# Patient Record
Sex: Male | Born: 1945 | Race: White | Hispanic: No | Marital: Married | Smoking: Never smoker
Health system: Southern US, Community
[De-identification: ages and names within clinical notes are randomized; demographics above are authoritative.]

## PROBLEM LIST (undated history)

## (undated) DIAGNOSIS — T4145XA Adverse effect of unspecified anesthetic, initial encounter: Secondary | ICD-10-CM

## (undated) DIAGNOSIS — I872 Venous insufficiency (chronic) (peripheral): Secondary | ICD-10-CM

## (undated) DIAGNOSIS — M2550 Pain in unspecified joint: Secondary | ICD-10-CM

## (undated) DIAGNOSIS — I4510 Unspecified right bundle-branch block: Secondary | ICD-10-CM

## (undated) DIAGNOSIS — I8290 Acute embolism and thrombosis of unspecified vein: Secondary | ICD-10-CM

## (undated) DIAGNOSIS — M62838 Other muscle spasm: Secondary | ICD-10-CM

## (undated) DIAGNOSIS — R569 Unspecified convulsions: Secondary | ICD-10-CM

## (undated) DIAGNOSIS — I890 Lymphedema, not elsewhere classified: Secondary | ICD-10-CM

## (undated) DIAGNOSIS — K219 Gastro-esophageal reflux disease without esophagitis: Secondary | ICD-10-CM

## (undated) DIAGNOSIS — F419 Anxiety disorder, unspecified: Secondary | ICD-10-CM

## (undated) DIAGNOSIS — G3184 Mild cognitive impairment, so stated: Secondary | ICD-10-CM

## (undated) DIAGNOSIS — G4730 Sleep apnea, unspecified: Secondary | ICD-10-CM

## (undated) DIAGNOSIS — K759 Inflammatory liver disease, unspecified: Secondary | ICD-10-CM

## (undated) DIAGNOSIS — G4700 Insomnia, unspecified: Secondary | ICD-10-CM

## (undated) DIAGNOSIS — I639 Cerebral infarction, unspecified: Secondary | ICD-10-CM

## (undated) DIAGNOSIS — E785 Hyperlipidemia, unspecified: Secondary | ICD-10-CM

## (undated) DIAGNOSIS — H269 Unspecified cataract: Secondary | ICD-10-CM

## (undated) DIAGNOSIS — Z8719 Personal history of other diseases of the digestive system: Secondary | ICD-10-CM

## (undated) DIAGNOSIS — F32A Depression, unspecified: Secondary | ICD-10-CM

## (undated) DIAGNOSIS — T8859XA Other complications of anesthesia, initial encounter: Secondary | ICD-10-CM

## (undated) DIAGNOSIS — I2510 Atherosclerotic heart disease of native coronary artery without angina pectoris: Secondary | ICD-10-CM

## (undated) DIAGNOSIS — G8929 Other chronic pain: Secondary | ICD-10-CM

## (undated) DIAGNOSIS — M1990 Unspecified osteoarthritis, unspecified site: Secondary | ICD-10-CM

## (undated) DIAGNOSIS — J45909 Unspecified asthma, uncomplicated: Secondary | ICD-10-CM

## (undated) DIAGNOSIS — Z8709 Personal history of other diseases of the respiratory system: Secondary | ICD-10-CM

## (undated) DIAGNOSIS — M549 Dorsalgia, unspecified: Secondary | ICD-10-CM

## (undated) DIAGNOSIS — F329 Major depressive disorder, single episode, unspecified: Secondary | ICD-10-CM

## (undated) DIAGNOSIS — G250 Essential tremor: Secondary | ICD-10-CM

## (undated) DIAGNOSIS — I10 Essential (primary) hypertension: Secondary | ICD-10-CM

## (undated) DIAGNOSIS — R200 Anesthesia of skin: Secondary | ICD-10-CM

## (undated) DIAGNOSIS — B1910 Unspecified viral hepatitis B without hepatic coma: Secondary | ICD-10-CM

## (undated) HISTORY — PX: UMBILICAL HERNIA REPAIR: SHX196

## (undated) HISTORY — PX: KNEE ARTHROSCOPY: SUR90

## (undated) HISTORY — PX: HERNIA REPAIR: SHX51

## (undated) HISTORY — PX: BACK SURGERY: SHX140

## (undated) HISTORY — PX: COLONOSCOPY: SHX174

## (undated) HISTORY — PX: TONSILLECTOMY: SUR1361

## (undated) HISTORY — DX: Unspecified cataract: H26.9

## (undated) HISTORY — DX: Inflammatory liver disease, unspecified: K75.9

## (undated) HISTORY — DX: Anxiety disorder, unspecified: F41.9

## (undated) HISTORY — PX: BLEPHAROPLASTY: SUR158

## (undated) HISTORY — PX: ESOPHAGOGASTRODUODENOSCOPY: SHX1529

## (undated) HISTORY — DX: Hyperlipidemia, unspecified: E78.5

## (undated) HISTORY — DX: Unspecified convulsions: R56.9

## (undated) HISTORY — PX: CORONARY ANGIOPLASTY WITH STENT PLACEMENT: SHX49

## (undated) HISTORY — PX: EYE SURGERY: SHX253

## (undated) HISTORY — PX: MEDIAL PARTIAL KNEE REPLACEMENT: SHX5965

## (undated) HISTORY — DX: Unspecified osteoarthritis, unspecified site: M19.90

## (undated) HISTORY — PX: CARDIAC CATHETERIZATION: SHX172

## (undated) HISTORY — PX: JOINT REPLACEMENT: SHX530

## (undated) HISTORY — DX: Depression, unspecified: F32.A

## (undated) HISTORY — PX: OTHER SURGICAL HISTORY: SHX169

## (undated) HISTORY — PX: CARPAL TUNNEL RELEASE: SHX101

## (undated) HISTORY — DX: Major depressive disorder, single episode, unspecified: F32.9

## (undated) HISTORY — DX: Atherosclerotic heart disease of native coronary artery without angina pectoris: I25.10

---

## 1997-03-02 HISTORY — PX: OTHER SURGICAL HISTORY: SHX169

## 2003-01-30 HISTORY — PX: CORONARY ANGIOPLASTY WITH STENT PLACEMENT: SHX49

## 2010-01-01 DIAGNOSIS — H499 Unspecified paralytic strabismus: Secondary | ICD-10-CM | POA: Insufficient documentation

## 2012-05-26 DIAGNOSIS — Z9889 Other specified postprocedural states: Secondary | ICD-10-CM | POA: Insufficient documentation

## 2012-05-29 DIAGNOSIS — I4510 Unspecified right bundle-branch block: Secondary | ICD-10-CM | POA: Insufficient documentation

## 2012-05-31 ENCOUNTER — Encounter: Payer: Self-pay | Admitting: Internal Medicine

## 2012-06-21 DIAGNOSIS — R4182 Altered mental status, unspecified: Secondary | ICD-10-CM | POA: Insufficient documentation

## 2012-06-30 ENCOUNTER — Ambulatory Visit: Payer: Self-pay | Admitting: Orthopedic Surgery

## 2013-03-22 ENCOUNTER — Ambulatory Visit: Payer: Self-pay | Admitting: General Practice

## 2013-03-22 LAB — BASIC METABOLIC PANEL
Anion Gap: 4 — ABNORMAL LOW (ref 7–16)
BUN: 12 mg/dL (ref 7–18)
Calcium, Total: 9.2 mg/dL (ref 8.5–10.1)
Chloride: 96 mmol/L — ABNORMAL LOW (ref 98–107)
Co2: 28 mmol/L (ref 21–32)
Creatinine: 0.77 mg/dL (ref 0.60–1.30)
EGFR (African American): >60
EGFR (Non-African Amer.): >60
Glucose: 96 mg/dL (ref 65–99)
Osmolality: 257 (ref 275–301)
Potassium: 4 mmol/L (ref 3.5–5.1)
Sodium: 128 mmol/L — ABNORMAL LOW (ref 136–145)

## 2013-03-22 LAB — CBC
HCT: 42.3 % (ref 40.0–52.0)
HGB: 15.1 g/dL (ref 13.0–18.0)
MCH: 32.1 pg (ref 26.0–34.0)
MCHC: 35.7 g/dL (ref 32.0–36.0)
MCV: 90 fL (ref 80–100)
Platelet: 110 10*3/uL — ABNORMAL LOW (ref 150–440)
RBC: 4.7 10*6/uL (ref 4.40–5.90)
RDW: 13.2 % (ref 11.5–14.5)
WBC: 4.4 10*3/uL (ref 3.8–10.6)

## 2013-03-22 LAB — URINALYSIS, COMPLETE
Bacteria: NONE SEEN
Bilirubin,UR: NEGATIVE
Blood: NEGATIVE
Glucose,UR: NEGATIVE mg/dL (ref 0–75)
Ketone: NEGATIVE
Leukocyte Esterase: NEGATIVE
Nitrite: NEGATIVE
Ph: 6 (ref 4.5–8.0)
Protein: NEGATIVE
RBC,UR: 1 /HPF (ref 0–5)
Specific Gravity: 1.011 (ref 1.003–1.030)
Squamous Epithelial: <1
WBC UR: NONE SEEN /HPF (ref 0–5)

## 2013-03-22 LAB — APTT: Activated PTT: 30.9 secs (ref 23.6–35.9)

## 2013-03-22 LAB — PROTIME-INR
INR: 0.9
Prothrombin Time: 12.3 secs (ref 11.5–14.7)

## 2013-03-22 LAB — SEDIMENTATION RATE: Erythrocyte Sed Rate: 5 mm/hr (ref 0–20)

## 2013-03-22 LAB — MRSA PCR SCREENING

## 2013-03-23 LAB — URINE CULTURE

## 2013-04-05 ENCOUNTER — Inpatient Hospital Stay: Payer: Self-pay | Admitting: General Practice

## 2013-04-06 LAB — BASIC METABOLIC PANEL
Anion Gap: 4 — ABNORMAL LOW (ref 7–16)
BUN: 10 mg/dL (ref 7–18)
Calcium, Total: 7.9 mg/dL — ABNORMAL LOW (ref 8.5–10.1)
Chloride: 94 mmol/L — ABNORMAL LOW (ref 98–107)
Co2: 31 mmol/L (ref 21–32)
Creatinine: 0.88 mg/dL (ref 0.60–1.30)
EGFR (African American): >60
EGFR (Non-African Amer.): >60
Glucose: 122 mg/dL — ABNORMAL HIGH (ref 65–99)
Osmolality: 259 (ref 275–301)
Potassium: 4 mmol/L (ref 3.5–5.1)
Sodium: 129 mmol/L — ABNORMAL LOW (ref 136–145)

## 2013-04-06 LAB — HEMOGLOBIN: HGB: 12.7 g/dL — ABNORMAL LOW (ref 13.0–18.0)

## 2013-04-06 LAB — PLATELET COUNT: Platelet: 111 10*3/uL — ABNORMAL LOW (ref 150–440)

## 2013-04-07 LAB — BASIC METABOLIC PANEL
Anion Gap: 4 — ABNORMAL LOW (ref 7–16)
BUN: 11 mg/dL (ref 7–18)
Calcium, Total: 8.2 mg/dL — ABNORMAL LOW (ref 8.5–10.1)
Chloride: 92 mmol/L — ABNORMAL LOW (ref 98–107)
Co2: 30 mmol/L (ref 21–32)
Creatinine: 0.92 mg/dL (ref 0.60–1.30)
EGFR (African American): >60
EGFR (Non-African Amer.): >60
Glucose: 105 mg/dL — ABNORMAL HIGH (ref 65–99)
Osmolality: 253 (ref 275–301)
Potassium: 3.6 mmol/L (ref 3.5–5.1)
Sodium: 126 mmol/L — ABNORMAL LOW (ref 136–145)

## 2013-04-07 LAB — HEMOGLOBIN: HGB: 11.6 g/dL — ABNORMAL LOW (ref 13.0–18.0)

## 2013-04-07 LAB — PLATELET COUNT: Platelet: 110 10*3/uL — ABNORMAL LOW (ref 150–440)

## 2013-04-10 ENCOUNTER — Emergency Department: Payer: Self-pay | Admitting: Emergency Medicine

## 2013-04-10 LAB — COMPREHENSIVE METABOLIC PANEL
Albumin: 3 g/dL — ABNORMAL LOW (ref 3.4–5.0)
Alkaline Phosphatase: 43 U/L — ABNORMAL LOW
Anion Gap: 3 — ABNORMAL LOW (ref 7–16)
BUN: 12 mg/dL (ref 7–18)
Bilirubin,Total: 0.5 mg/dL (ref 0.2–1.0)
Calcium, Total: 9.2 mg/dL (ref 8.5–10.1)
Chloride: 95 mmol/L — ABNORMAL LOW (ref 98–107)
Co2: 33 mmol/L — ABNORMAL HIGH (ref 21–32)
Creatinine: 1 mg/dL (ref 0.60–1.30)
EGFR (African American): >60
EGFR (Non-African Amer.): >60
Glucose: 104 mg/dL — ABNORMAL HIGH (ref 65–99)
Osmolality: 263 (ref 275–301)
Potassium: 4 mmol/L (ref 3.5–5.1)
SGOT(AST): 28 U/L (ref 15–37)
SGPT (ALT): 24 U/L (ref 12–78)
Sodium: 131 mmol/L — ABNORMAL LOW (ref 136–145)
Total Protein: 6.6 g/dL (ref 6.4–8.2)

## 2013-04-10 LAB — CBC
HCT: 30.9 % — ABNORMAL LOW (ref 40.0–52.0)
HGB: 11 g/dL — ABNORMAL LOW (ref 13.0–18.0)
MCH: 32.7 pg (ref 26.0–34.0)
MCHC: 35.6 g/dL (ref 32.0–36.0)
MCV: 92 fL (ref 80–100)
Platelet: 146 10*3/uL — ABNORMAL LOW (ref 150–440)
RBC: 3.36 10*6/uL — ABNORMAL LOW (ref 4.40–5.90)
RDW: 13.1 % (ref 11.5–14.5)
WBC: 5.1 10*3/uL (ref 3.8–10.6)

## 2013-04-14 ENCOUNTER — Ambulatory Visit: Payer: Self-pay | Admitting: General Practice

## 2013-06-01 DIAGNOSIS — I639 Cerebral infarction, unspecified: Secondary | ICD-10-CM | POA: Insufficient documentation

## 2013-06-01 DIAGNOSIS — Z8673 Personal history of transient ischemic attack (TIA), and cerebral infarction without residual deficits: Secondary | ICD-10-CM | POA: Insufficient documentation

## 2013-07-25 DIAGNOSIS — I38 Endocarditis, valve unspecified: Secondary | ICD-10-CM | POA: Insufficient documentation

## 2014-02-12 DIAGNOSIS — R0681 Apnea, not elsewhere classified: Secondary | ICD-10-CM | POA: Insufficient documentation

## 2014-02-27 DIAGNOSIS — I5030 Unspecified diastolic (congestive) heart failure: Secondary | ICD-10-CM

## 2014-02-27 HISTORY — DX: Unspecified diastolic (congestive) heart failure: I50.30

## 2014-03-01 ENCOUNTER — Ambulatory Visit: Payer: Self-pay | Admitting: Internal Medicine

## 2014-03-02 LAB — BASIC METABOLIC PANEL
Anion Gap: 5 — ABNORMAL LOW (ref 7–16)
BUN: 11 mg/dL (ref 7–18)
Calcium, Total: 7.9 mg/dL — ABNORMAL LOW (ref 8.5–10.1)
Chloride: 105 mmol/L (ref 98–107)
Co2: 30 mmol/L (ref 21–32)
Creatinine: 0.92 mg/dL (ref 0.60–1.30)
EGFR (African American): >60
EGFR (Non-African Amer.): >60
Glucose: 105 mg/dL — ABNORMAL HIGH (ref 65–99)
Osmolality: 279 (ref 275–301)
Potassium: 4.1 mmol/L (ref 3.5–5.1)
Sodium: 140 mmol/L (ref 136–145)

## 2014-03-02 LAB — CK TOTAL AND CKMB (NOT AT ARMC)
CK, Total: 87 U/L (ref 39–308)
CK-MB: 1.3 ng/mL (ref 0.5–3.6)

## 2014-06-23 NOTE — Discharge Summary (Signed)
PATIENT NAME:  Johnny Mccoy, Johnny Mccoy MR#:  [id number] DATE OF BIRTH:  [birth year]  DATE OF ADMISSION:  04/05/2013 DATE OF DISCHARGE:  04/08/2013  ADMITTING DIAGNOSIS: Failed right unicompartmental knee arthroplasty.   DISCHARGE DIAGNOSIS: Failed right unicompartmental knee arthroplasty.   HISTORY OF PRESENT ILLNESS:   The patient is a 68 year old gentleman who presented to [facility] complaining of right knee pain. The patient has had almost a 1-year history of knee pain after undergoing a medial unicompartmental left knee arthroplasty performed by Dr. Moss Mc at [place]. He underwent a course of extensive physical therapy and subsequent aquatic therapy. He had stated that his persistent medial knee pain had persisted and was subsequently referred to the pain management clinic. He had also gone a series of A-stim without any improvement in his condition. He was having difficulty getting up from a sitting position as well as unable to ambulate stairs with a reciprocal gait. Extension seemed to aggravate his condition. He denied any problems with flexion. He also denied any gross locking of the knee, but had some near giving way. X-rays taken in [place] showed component to the medial compartment space. The femoral component was felt to be too medialized and may be edge loading on the polyethylene. The anterior flange of the femoral component on the lateral view appeared to be very prominent. On the plain films, there was no gross evidence of any loosening. After discussion of the risks and benefits of surgical intervention, the patient expressed his understanding of the risks and benefits and agreed for plans for surgical intervention.   PROCEDURE: Right total knee revision arthroplasty with removal of unicompartmental knee component and subsequently, revision to a total knee arthroplasty using primary components.   ANESTHESIA: Spinal.   SOFT TISSUE RELEASE: Anterior cruciate  ligament, posterior cruciate ligament, deep medial collateral ligaments, as well as patellofemoral ligament.   IMPLANTS UTILIZED: DePuy size 3 posterior stabilized femoral component (cemented), size 4 MBT tibial component (cemented), 38 mm 3 pegged oval dome patella (cemented), and a 12.5 mm stabilized rotating platform polyethylene insert. Gentamicin cement was used secondary to his previous joint surgery.   HOSPITAL COURSE: The patient tolerated the procedure very well. He had no complications. He was then taken to the PACU where he was stabilized and then transferred to the orthopedic floor. He began receiving anticoagulation therapy of Lovenox 30 mg subcutaneous q. 12 hours per anesthesia and pharmacy protocol. He was fitted with TED stockings bilaterally. These were allowed to be removed 1 hour per 8 hour shift. He was also fitted with AVI compression foot pumps bilaterally set on 80 mmHg. His calves have been nontender. There has been no evidence of any DVTs. Negative Homans sign. Heels were elevated off the bed using rolled towels.   The patient has denied any chest pain or shortness of breath. Vital signs have been stable. He has been afebrile. Hemodynamically he was stable. No transfusions were given other than the Autovac transfusions the first 6 hours postoperatively.   Physical therapy was initiated on day 1 for gait training and transfers. Upon being discharged was ambulating greater than 200 feet. He was able go up and down 4 sets of steps. He was independent with bed to chair transfers. Occupational therapy was also initiated on day 1 for ADL and assistive devices.   The patient's IV, Foley and Hemovac were discontinued on day 2 along with a dressing change. The wound was free of any drainage or signs of infection. Polar  Care was reapplied to the surgical leg, maintaining a temperature of 40 to 50 degrees Fahrenheit.   The patient is being discharged to home in improved stable condition.  He may continue weight-bearing as tolerated. Continue using a walker until cleared by physical therapy to go to a quad cane. He is to wear his stockings during the day but may remove these at night. Elevate heels off the bed. Elevate the lower leg when sitting in a chair. Incentive spirometer q. 1 while awake. Encourage the patient to continue cough, deep breathing q. 2 hours while awake. He was placed on a regular diet. Continue using Polar Care maintaining a temperature of 40 to 50 degrees Fahrenheit. Have advised the patient to try to use this around-the-clock for the first 2 weeks.  He will receive home health PT. The patient has a follow-up appointment on 02/19 at 8:15. He is to call the clinic sooner if any temperatures of 101.5 or greater or excessive bleeding. He was instructed on wound care. He is not to get the wound wet until the staples are removed. He is to resume his regular medications that he was on prior to admission. He was given a prescription for oxycodone 5 to 10 mg q. 4 to 6 hours p.r.n. for pain, Ultram 50 to 100 mg every 4 to 6 hours p.r.n. for pain and Lovenox 40 mg subcutaneously daily for 14 days, then discontinue and begin taking one 81 mg enteric-coated aspirin.   DRUG ALLERGIES: CODEINE.   PAST MEDICAL HISTORY:  Seizures, hepatitis, depression, cataracts, blood clots, arthritis.   ____________________________ Vance Peper, PA [id number] D: 04/07/2013 07:15:02 ET T: 04/07/2013 08:11:36 ET JOB#: [id number]  cc: Vance Peper, PA, <Dictator> Jordann Grime PA ELECTRONICALLY SIGNED 04/12/2013 14:40

## 2014-06-23 NOTE — Op Note (Signed)
PATIENT NAME:  Johnny Mccoy, Johnny Mccoy MR#:  [id number] DATE OF BIRTH:  17-Aug-1945  DATE OF PROCEDURE:  04/05/2013  PREOPERATIVE DIAGNOSIS: Failed right unicompartmental knee arthroplasty.   POSTOPERATIVE DIAGNOSIS: Failed right unicompartmental knee arthroplasty (loose components).   PROCEDURE PERFORMED: Right total knee revision arthroplasty (removal of unicompartmental knee components and subsequent revision to total knee arthroplasty).   SURGEON: Laurice Record. Holley Bouche., M.D.   ASSISTANT: Vance Peper, PA (required to maintain retraction throughout the procedure).   ANESTHESIA: Spinal.   ESTIMATED BLOOD LOSS: 100 mL.   FLUIDS REPLACED: 2000 mL of crystalloid.   TOURNIQUET TIME: 139 minutes.   DRAINS: Two medium drains to reinfusion system.   SOFT TISSUE RELEASES: Anterior cruciate ligament, posterior cruciate ligament, deep medial collateral ligament, and patellofemoral ligament.   IMPLANTS UTILIZED: DePuy size 3 posterior stabilized femoral component (cemented), size 4 MBT tibial component (cemented), 38 mm 3 peg oval dome patella (cemented), and 12.5 mm stabilized rotating platform polyethylene insert. Gentamicin bone cement was utilized.   INDICATIONS FOR SURGERY: The patient is a 69 year old male who previously underwent right medial unicompartmental knee arthroplasty by an outside physician 11 months ago. The patient had had persistent pain, especially along the medial aspect of the knee. After discussion of the risks and benefits of surgical intervention, the patient expressed understanding of the risks and benefits and agreed with plans for surgical intervention.   PROCEDURE IN DETAIL: The patient was brought into the operating room and, after adequate spinal anesthesia was achieved, a tourniquet was placed on the patient's upper right thigh. The patient's right knee and leg were cleaned and prepped with alcohol and DuraPrep and draped in the usual sterile fashion. A "timeout" was performed  as per usual protocol. The right lower extremity was exsanguinated using an Esmarch, and the tourniquet was inflated to 300 mmHg. An anterior longitudinal incision was made, followed by a standard mid vastus approach. A moderate effusion was evacuated. The deep fibers of the medial collateral ligament were elevated in a subperiosteal fashion off the medial flare of the tibia so as to maintain a continuous soft tissue sleeve. The patella was subluxed laterally, and the patellofemoral ligament was incised. Inspection of the knee demonstrated some chondral changes to the lateral and patellofemoral articulation. Some chondral changes were noted along the margins of the femoral implant. Some residual cement debris was noted along the interface of the tibial component, and these areas were debrided using a rongeur. The polyethylene was disengaged from the tibial component and inspected. Significant pitting was noted to the articulating surface. Next, a curette was used to debride soft tissue from around the margins of the femoral and tibial components. Anterior and posterior cruciate ligaments were excised. The tibial component was noted to be loosened and was carefully disengaged without any significant loss of bone. In a similar fashion, the femoral implant was visualized and cleared of soft tissue from around its margin. Some motion was noted to the femoral component and vise-grip pliers were used to engage the implant and it was then gently disengaged from the bone with no significant loss of bone. Pilot hole for placement of the intramedullary femoral guide was created and the intramedullary guide inserted. Distal cutting guide was fixed into position so as to allow a 10 mm distal cut. This was performed and subsequently checked. Distal femur was sized, and it was felt that a size 3 femoral component was appropriate. A size 3 cutting guide was positioned with orientation in line with the  epicondylar axis. Anterior  cut was performed. This was followed by completion of the posterior and chamfer cuts. Femoral cutting guide for central box was then positioned, and the central box cut was performed. Medial and lateral menisci were excised. The extramedullary tibial cutting guide was positioned so as to resect a minimal amount of bone from the medial aspect of the plateau. Cut was performed. Bone was actually in good condition with minimal residual bony defect medially. The proximal tibia was sized, and it was felt that a size 4 tibial tray was appropriate. Tibial and femoral trials were inserted, followed by insertion of first a 10 and subsequently a 12.5 mm polyethylene trial. This allowed for excellent mediolateral soft tissue balancing both in full extension and in flexion. Finally, the patella was cut and prepared so as to accommodate a 38 mm 3 peg oval dome patella. Patellar trial was placed, and the knee was placed through a range of motion with excellent patellar tracking appreciated.   Femoral trial was removed. Central post hole for the tibial component was reamed, followed by insertion of a keel punch. Tibial trial was then removed. Cut surfaces of bone were irrigated with copious amounts of normal saline with antibiotic solution using pulsatile lavage and then suctioned dry. Polymethylmethacrylate cement with gentamicin was prepared in the usual fashion using a vacuum mixer. Cement was applied to the cut surface of the proximal tibia as well as along the undersurface of a size 4 MBT tibial component. The tibial component was positioned and impacted into place. Excess cement was removed using [employer] fast streamer. Cement was then applied to the cut surface of the femur as well as on the posterior flanges of a size 3 posterior stabilized femoral component. Femoral component was positioned and impacted into place. Excess cement was removed using [employer] fast streamer. A 12.5 mm polyethylene trial was inserted, and the knee was  brought into full extension with steady axial compression applied. Finally, cement was applied to the backside of a 38 mm 3 peg oval dome patella, and the patellar component was positioned and patellar clamp applied. Excess cement was removed using [employer] fast streamer.   After adequate curing of the cement, the tourniquet was deflated after a total tourniquet time of 139 minutes. Hemostasis was achieved using electrocautery. Good hemostasis was appreciated. The knee was inspected for any residual cement debris. Then, 20 mL of 1.3% Exparel and 40 mL of normal saline was injected along the posterior capsule, medial and lateral gutters, and along the arthrotomy site. A 12.5 mm stabilized rotating platform polyethylene insert was inserted, and the knee was placed through a range of motion with excellent patellar tracking appreciated and excellent mediolateral soft tissue balancing noted. Two medium drains were placed in the wound bed and brought out through a separate stab incision to be attached to a reinfusion system. The medial parapatellar portion of the incision was reapproximated using interrupted sutures of #1 Vicryl. The subcutaneous tissue was reapproximated in layers using first #0 Vicryl, followed by #2-0 Vicryl. The skin was closed with skin staples. It should be noted that 30 mL of 0.25% Marcaine with epinephrine was injected along the subcutaneous tissue.   Sterile dressing was applied. The patient tolerated the procedure well. He was transported to the recovery room in stable condition.   ____________________________ Laurice Record. Holley Bouche., MD [id number] D: 04/06/2013 00:39:40 ET T: 04/06/2013 03:33:02 ET JOB#: [id number]  cc: Jeneen Rinks P. Holley Bouche., MD, <Dictator> Laurice Record Holley Bouche MD ELECTRONICALLY SIGNED 04/21/2013 6:41

## 2014-06-27 NOTE — Discharge Summary (Signed)
PATIENT NAME:  Johnny Mccoy, Johnny Mccoy MR#:  [id number] DATE OF BIRTH:  16-Jul-1945  DATE OF ADMISSION:  03/01/2014 DATE OF DISCHARGE:  03/02/2014  DISCHARGE DIAGNOSES:   1. Known coronary artery disease, status post previous stenting with progressive anginal symptoms.  2. Mixed hyperlipidemia.  3. Essential hypertension.   HISTORY OF PRESENT ILLNESS: This is a 69 year old male with known coronary artery disease, status post previous stenting in the past, mixed hyperlipidemia, and essential hypertension. The patient has had appropriate treatment when he has had  progressive symptoms of angina and shortness of breath, Canadian class III, with a stress test showing high risked abnormalities consistent with multivessel coronary artery disease with symptoms at peak stress. The patient was started on the nitrates and beta blocker, as well as ACE inhibitor for these symptoms and continued to have progressive symptoms. The patient, therefore, underwent cardiac catheterization showing a patent stent to an obtuse marginal artery with minimal atherosclerosis of left anterior descending artery, and further restenosis of mid right coronary artery at stent site causing his current symptoms. He underwent PCI and drug-eluting stent of his mid right coronary artery without complication and was, therefore, ambulating without any further significant symptoms or other concerns. The patient had reached his maximal hospital benefit and was prepared for discharge home. We have discussed and/or recommended cardiac rehabilitation after his intervention and follow-up in approximately 2 weeks. The patient will be continued on Brilinta 90 mg twice per day with aspirin 81 mg p.o. daily, with continuation of all of his other medication management from  previous, that was treating his mixed hyperlipidemia and essential hypertension.    ____________________________ Corey Skains, MD [id number] D: 03/02/2014 09:19:44 ET T: 03/02/2014  11:29:43 ET JOB#: [id number]  cc: Corey Skains, MD, <Dictator> Corey Skains MD ELECTRONICALLY SIGNED 03/12/2014 13:52

## 2014-07-16 DIAGNOSIS — I10 Essential (primary) hypertension: Secondary | ICD-10-CM | POA: Insufficient documentation

## 2014-07-26 DIAGNOSIS — R059 Cough, unspecified: Secondary | ICD-10-CM | POA: Insufficient documentation

## 2014-07-26 DIAGNOSIS — R05 Cough: Secondary | ICD-10-CM | POA: Insufficient documentation

## 2014-09-11 ENCOUNTER — Other Ambulatory Visit: Payer: Self-pay | Admitting: Internal Medicine

## 2014-09-11 DIAGNOSIS — G459 Transient cerebral ischemic attack, unspecified: Secondary | ICD-10-CM

## 2014-09-14 ENCOUNTER — Ambulatory Visit
Admission: RE | Admit: 2014-09-14 | Discharge: 2014-09-14 | Disposition: A | Discharge status: Home / Self Care | Payer: Medicare Other | Source: Ambulatory Visit | Attending: Internal Medicine | Admitting: Internal Medicine

## 2014-09-14 DIAGNOSIS — G459 Transient cerebral ischemic attack, unspecified: Secondary | ICD-10-CM | POA: Diagnosis present

## 2014-09-14 HISTORY — DX: Essential (primary) hypertension: I10

## 2014-09-14 HISTORY — DX: Unspecified asthma, uncomplicated: J45.909

## 2014-09-20 DIAGNOSIS — I350 Nonrheumatic aortic (valve) stenosis: Secondary | ICD-10-CM

## 2014-09-20 HISTORY — DX: Nonrheumatic aortic (valve) stenosis: I35.0

## 2015-05-07 ENCOUNTER — Other Ambulatory Visit: Payer: Self-pay | Admitting: Internal Medicine

## 2015-05-07 ENCOUNTER — Ambulatory Visit
Admission: RE | Admit: 2015-05-07 | Discharge: 2015-05-07 | Disposition: A | Discharge status: Home / Self Care | Payer: Medicare Other | Source: Ambulatory Visit | Attending: Internal Medicine | Admitting: Internal Medicine

## 2015-05-07 DIAGNOSIS — D150 Benign neoplasm of thymus: Secondary | ICD-10-CM | POA: Insufficient documentation

## 2015-05-07 DIAGNOSIS — R05 Cough: Secondary | ICD-10-CM | POA: Insufficient documentation

## 2015-05-07 DIAGNOSIS — R0602 Shortness of breath: Secondary | ICD-10-CM | POA: Insufficient documentation

## 2015-05-07 DIAGNOSIS — K760 Fatty (change of) liver, not elsewhere classified: Secondary | ICD-10-CM | POA: Diagnosis not present

## 2015-05-07 DIAGNOSIS — I2510 Atherosclerotic heart disease of native coronary artery without angina pectoris: Secondary | ICD-10-CM | POA: Diagnosis not present

## 2015-05-07 MED ORDER — IOHEXOL 350 MG/ML SOLN
100.0000 mL | Freq: Once | INTRAVENOUS | Status: AC | PRN
  Administered 2015-05-07: 100 mL via INTRAVENOUS

## 2015-05-15 ENCOUNTER — Inpatient Hospital Stay: Payer: Medicare Other | Attending: Cardiothoracic Surgery | Admitting: Cardiothoracic Surgery

## 2015-05-15 VITALS — BP 156/80 | HR 70 | Temp 98.9°F | Resp 18 | Ht 67.13 in | Wt 220.2 lb

## 2015-05-15 DIAGNOSIS — R222 Localized swelling, mass and lump, trunk: Secondary | ICD-10-CM | POA: Insufficient documentation

## 2015-05-15 DIAGNOSIS — I10 Essential (primary) hypertension: Secondary | ICD-10-CM | POA: Diagnosis not present

## 2015-05-15 DIAGNOSIS — J45909 Unspecified asthma, uncomplicated: Secondary | ICD-10-CM | POA: Diagnosis not present

## 2015-05-15 DIAGNOSIS — Z79899 Other long term (current) drug therapy: Secondary | ICD-10-CM | POA: Diagnosis not present

## 2015-05-15 DIAGNOSIS — R079 Chest pain, unspecified: Secondary | ICD-10-CM | POA: Insufficient documentation

## 2015-05-15 DIAGNOSIS — I998 Other disorder of circulatory system: Secondary | ICD-10-CM | POA: Diagnosis not present

## 2015-05-15 DIAGNOSIS — J9859 Other diseases of mediastinum, not elsewhere classified: Secondary | ICD-10-CM | POA: Diagnosis not present

## 2015-05-15 DIAGNOSIS — Z7982 Long term (current) use of aspirin: Secondary | ICD-10-CM | POA: Diagnosis not present

## 2015-05-15 NOTE — Progress Notes (Signed)
Patient to have PET scan on 05/21/15.  Patient contacted by phone and appointment confirmed and pre PET scan instructions for a non-diabetic patient was reviewed with patient.  Patient relayed that the instructions were understood.  Follow up appointment with Dr. Genevive Bi scheduled for 05/23/15

## 2015-05-15 NOTE — Progress Notes (Signed)
Patient ID: Johnny Mccoy, male   DOB: October 25, 1945, 70 y.o.   MRN: [id number]  Chief Complaint  Patient presents with  . New Evaluation    mediastinal mass    Referred By Dr. Fulton Reek Reason for Referral mediastinal mass  HPI Location, Quality, Duration, Severity, Timing, Context, Modifying Factors, Associated Signs and Symptoms.  Johnny Mccoy is a 70 y.o. male.  This patient is a 70 year old gentleman who developed an upper respiratory tract infection and December and January which did not clear. He states that he was traveling in [place] at the time and when he cut back to the states he had a chest x-ray made. The chest x-ray did not reveal any specific abnormalities but he continued to complain of some deep chest pain as well as a feeling of cough when he exerted himself. 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 The patient states that he continues to have intermittent chest pain he describes this as a sharp pain substernal lasting anywhere from 10 minutes to one hour. He states it's been getting more frequent over the last several weeks. Exertion tends to bring this on. Of note is that he's had at least 10 heart catheterizations over the last 10 years or so. He's had multiple stents placed in his coronaries. His last cardiac catheter was in 2015. 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  It is also important to note that the patient states he has at least 21 alcoholic drinks per week in the form of wine and liquor.   Past Medical History  Diagnosis Date  . Asthma   . Collagen vascular disease ([place])   . Hypertension     No past surgical history on file.  No family history on file.  Social History Social History  Substance Use Topics  . Smoking status: Not on file  . Smokeless tobacco: Not on  file  . Alcohol Use: Not on file    Allergies  Allergen Reactions  . Codeine Nausea Only    Current Outpatient Prescriptions  Medication Sig Dispense Refill  . albuterol (PROAIR HFA) 108 (90 Base) MCG/ACT inhaler Inhale into the lungs.    Marland Kitchen amLODipine (NORVASC) 5 MG tablet     . aspirin EC 81 MG tablet Take by mouth.    Marland Kitchen atorvastatin (LIPITOR) 80 MG tablet Take by mouth.    . benzonatate (TESSALON) 200 MG capsule Take by mouth.    Marland Kitchen BREO ELLIPTA 100-25 MCG/INH AEPB     . chlorpheniramine-HYDROcodone (TUSSIONEX) 10-8 MG/5ML SUER TAKE 1 TEASPOONFUL (5 ML) BY MOUTH EVERY 12 HOURS AS NEEDED FOR COUGH FOR 10 DAYS  0  . [facility] MUCUS EXTENDED RELEASE 600 MG 12 hr tablet TAKE 2 TABLETS (1,200 MG TOTAL) BY MOUTH 2 (TWO) TIMES DAILY FOR 10 DAYS.  0  . dexlansoprazole (DEXILANT) 60 MG capsule     . FLOVENT HFA 220 MCG/ACT inhaler INHALE 1 PUFF INTO THE LUNGS TWICE A DAY  12  . hydrochlorothiazide (HYDRODIURIL) 25 MG tablet Take by mouth.    . lamoTRIgine (LAMICTAL) 100 MG tablet     . LORazepam (ATIVAN) 0.5 MG tablet Take by mouth.    . metoprolol succinate (TOPROL-XL) 50 MG 24 hr tablet TAKE 1 TABLET (50 MG TOTAL) BY MOUTH ONCE DAILY.  3  .  NAMENDA XR 28 MG CP24 24 hr capsule     . nitroGLYCERIN (NITROLINGUAL) 0.4 MG/SPRAY spray Place under the tongue.    Marland Kitchen PRISTIQ 100 MG 24 hr tablet     . traMADol (ULTRAM) 50 MG tablet Take by mouth.    . traZODone (DESYREL) 100 MG tablet Take by mouth.     No current facility-administered medications for this visit.      Review of Systems A complete review of systems was asked and was negative except for the following positive findings-And, hearing difficulty, frequent cough, shortness of breath, wheezing, frequent urination, anxiety, easy bruising.  Blood pressure 156/80, pulse 70, temperature 98.9 F (37.2 C), resp. rate 18, height 5' 7.13" (1.705 m), weight 220 lb 3.8 oz (99.9 kg).  Physical Exam CONSTITUTIONAL:  Pleasant, well-developed,  well-nourished, and in no acute distress. EYES: Pupils equal and reactive to light, Sclera non-icteric EARS, NOSE, MOUTH AND THROAT:  The oropharynx was clear.  Dentition is good repair.  Oral mucosa pink and moist. LYMPH NODES:  Lymph nodes in the neck and axillae were normal RESPIRATORY:  Lungs were clear.  Normal respiratory effort without pathologic use of accessory muscles of respiration CARDIOVASCULAR: Heart was regular without murmurs.  There were no carotid bruits. GI: The abdomen was soft, nontender, and nondistended. There were no palpable masses. There was no hepatosplenomegaly. There were normal bowel sounds in all quadrants. GU:  Rectal deferred.   MUSCULOSKELETAL:  Normal muscle strength and tone.  No clubbing or cyanosis.   SKIN:  There were no pathologic skin lesions.  There were no nodules on palpation. NEUROLOGIC:  Sensation is normal.  Cranial nerves are grossly intact. PSYCH:  Oriented to person, place and time.  Mood and affect are normal.  Data Reviewed CT scan of the chest  I have personally reviewed the patient's imaging, laboratory findings and medical records.    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 I told the patient that I would like to obtain a PET scan and also refer the patient to Dr. Ceasar Mons at [facility] where this type procedure can be performed. I also explained to him that his underlying cardiac disease would require further investigation prior to any resection of this mass. The patient asked about sternotomy versus robotic surgery. I told him I would leave this up to Dr. Servando Snare if he chooses to pursue surgical intervention. The patient states that he has not had any prior x-rays or CT scans and therefore its unclear how long this anterior mediastinal mass has been present.    Plan    I will go ahead and set up a PET scan to be performed. I've asked patient to contact me once  the PET scan is complete. Of also refer the patient to Dr. Servando Snare for his consideration of surgical resection.         Nestor Lewandowsky, MD 05/15/2015, 11:11 AM

## 2015-05-15 NOTE — Progress Notes (Signed)
Patient was having a cough over a month with chest pains so he went for a CT.  The CT scan that showed a mediastinal mass.  Still has a cough with pain in left side chest/breast area that is worse with cough.

## 2015-05-21 ENCOUNTER — Ambulatory Visit
Admission: RE | Admit: 2015-05-21 | Discharge: 2015-05-21 | Disposition: A | Discharge status: Home / Self Care | Payer: Medicare Other | Source: Ambulatory Visit | Attending: Cardiothoracic Surgery | Admitting: Cardiothoracic Surgery

## 2015-05-21 DIAGNOSIS — K760 Fatty (change of) liver, not elsewhere classified: Secondary | ICD-10-CM | POA: Insufficient documentation

## 2015-05-21 DIAGNOSIS — K4020 Bilateral inguinal hernia, without obstruction or gangrene, not specified as recurrent: Secondary | ICD-10-CM | POA: Insufficient documentation

## 2015-05-21 DIAGNOSIS — N400 Enlarged prostate without lower urinary tract symptoms: Secondary | ICD-10-CM | POA: Insufficient documentation

## 2015-05-21 DIAGNOSIS — I2510 Atherosclerotic heart disease of native coronary artery without angina pectoris: Secondary | ICD-10-CM | POA: Insufficient documentation

## 2015-05-21 DIAGNOSIS — J9859 Other diseases of mediastinum, not elsewhere classified: Secondary | ICD-10-CM | POA: Insufficient documentation

## 2015-05-21 DIAGNOSIS — R911 Solitary pulmonary nodule: Secondary | ICD-10-CM | POA: Diagnosis not present

## 2015-05-21 LAB — GLUCOSE, CAPILLARY: Glucose-Capillary: 125 mg/dL — ABNORMAL HIGH (ref 65–99)

## 2015-05-21 MED ORDER — FLUDEOXYGLUCOSE F - 18 (FDG) INJECTION
12.1800 | Freq: Once | INTRAVENOUS | Status: AC | PRN
  Administered 2015-05-21: 12.18 via INTRAVENOUS

## 2015-05-23 ENCOUNTER — Encounter: Payer: Self-pay | Admitting: Cardiothoracic Surgery

## 2015-05-23 ENCOUNTER — Inpatient Hospital Stay (HOSPITAL_BASED_OUTPATIENT_CLINIC_OR_DEPARTMENT_OTHER): Payer: Medicare Other | Admitting: Cardiothoracic Surgery

## 2015-05-23 VITALS — BP 159/87 | HR 79 | Temp 96.4°F | Resp 18 | Ht 67.5 in | Wt 217.3 lb

## 2015-05-23 DIAGNOSIS — R918 Other nonspecific abnormal finding of lung field: Secondary | ICD-10-CM

## 2015-05-23 DIAGNOSIS — F329 Major depressive disorder, single episode, unspecified: Secondary | ICD-10-CM | POA: Insufficient documentation

## 2015-05-23 DIAGNOSIS — R222 Localized swelling, mass and lump, trunk: Secondary | ICD-10-CM | POA: Diagnosis not present

## 2015-05-23 DIAGNOSIS — B1910 Unspecified viral hepatitis B without hepatic coma: Secondary | ICD-10-CM | POA: Insufficient documentation

## 2015-05-23 DIAGNOSIS — F419 Anxiety disorder, unspecified: Secondary | ICD-10-CM | POA: Insufficient documentation

## 2015-05-23 DIAGNOSIS — E785 Hyperlipidemia, unspecified: Secondary | ICD-10-CM | POA: Insufficient documentation

## 2015-05-23 DIAGNOSIS — F32A Depression, unspecified: Secondary | ICD-10-CM | POA: Insufficient documentation

## 2015-05-23 DIAGNOSIS — Z9889 Other specified postprocedural states: Secondary | ICD-10-CM | POA: Insufficient documentation

## 2015-05-23 DIAGNOSIS — I2510 Atherosclerotic heart disease of native coronary artery without angina pectoris: Secondary | ICD-10-CM | POA: Insufficient documentation

## 2015-05-23 DIAGNOSIS — K219 Gastro-esophageal reflux disease without esophagitis: Secondary | ICD-10-CM | POA: Insufficient documentation

## 2015-05-23 DIAGNOSIS — E782 Mixed hyperlipidemia: Secondary | ICD-10-CM | POA: Insufficient documentation

## 2015-05-23 NOTE — Progress Notes (Signed)
[facility] Post-[facility] Note  Patient ID: Johnny Mccoy, male   DOB: 1946/01/26, 70 y.o.   MRN: [id number]  HISTORY: This patient returns today in follow-up. He continues to have substernal chest pain which appears somewhat atypical. He describes this as pain with upon palpation of the sternum but also with inspiration and occasionally without. He did undergo a PET scan. Those results are listed below.   Filed Vitals:   05/23/15 0814  BP: 159/87  Pulse: 79  Temp: 96.4 F (35.8 C)  Resp: 18     EXAM: Resp: Lungs are clear bilaterally.  No respiratory distress, normal effort. Heart:  Regular without murmurs Abd:  Abdomen is soft, non distended and non tender. No masses are palpable.  There is no rebound and no guarding.  Neurological: Alert and oriented to person, place, and time. Coordination normal.  Skin: Skin is warm and dry. No rash noted. No diaphoretic. No erythema. No pallor.  Psychiatric: Normal mood and affect. Normal behavior. Judgment and thought content normal.    ASSESSMENT: I have independently reviewed the patient's PET scan. The anterior mediastinal mass is not hypermetabolic. However there are bilateral hilar and some pretracheal lymph nodes that do appear on the PET scan. I do not believe that these are related to his anterior mediastinal mass.   PLAN:   I had a long discussion with him today. I did tell the patient and his wife that I would recommend that he follow-up with Dr. Ceasar Mons in [place] for consideration of surgical resection. I'm certain that Dr. Servando Snare would appreciate having the results of the cardiac catheterizations that have been done both at [facility] and here at [facility].  I would be most happy to follow him postoperatively. Patient did voices agreement and will obtain the records from his prior cardiac catheterizations. He will follow-up with Dr. Servando Snare within the next week.    Nestor Lewandowsky, MD

## 2015-05-23 NOTE — Progress Notes (Signed)
Nervous about scan today. Pt reports moderate fatigue and SOB on exertion

## 2015-05-29 ENCOUNTER — Institutional Professional Consult (permissible substitution) (INDEPENDENT_AMBULATORY_CARE_PROVIDER_SITE_OTHER): Payer: Medicare Other | Admitting: Cardiothoracic Surgery

## 2015-05-29 ENCOUNTER — Encounter: Payer: Self-pay | Admitting: Cardiothoracic Surgery

## 2015-05-29 VITALS — BP 138/80 | HR 67 | Resp 20 | Ht 67.5 in | Wt 216.0 lb

## 2015-05-29 DIAGNOSIS — J9859 Other diseases of mediastinum, not elsewhere classified: Secondary | ICD-10-CM | POA: Diagnosis not present

## 2015-05-29 NOTE — Progress Notes (Signed)
[address]       [place] [address]             [phone]                    Lamonte A Janelle [place] Medical Record [id number] Date of Birth: 20-Sep-1945  Referring: Nestor Lewandowsky, MD Primary Care: Idelle Crouch, MD  Chief Complaint:    Chief Complaint  Patient presents with  . Mediastinal Mass    Surgical eval, PET Scan 05/21/15, CTA Chest 05/07/15    History of Present Illness:    Johnny Mccoy 70 y.o. male is seen in the office  today for A calcified anterior mediastinal mass, not PET avid. The patient gives a long history of known coronary occlusive disease having had numerous cardiac catheterizations and stents in the past. He is a non-smoker. Because of recent recurrent episodes of bronchitis before and after a recent trip to [place] CT of the chest was performed. 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 Subsequent PET scan has been performed and the mass is non-avid. He did have some nonenlarged mildly hypermetabolic mediastinal nodes.   The patient has an extensive cardiac history and although he wishes to attribute these symptoms to the mediastinal mass it is unlikely that the incidental finding of this mass is related to any of the symptoms, he complains of deep burning chest discomfort associated with arm numbness, it comes on both at rest and with activity. 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  Current Activity/ Functional Status:  Patient is independent with mobility/ambulation, transfers, ADL's, IADL's.   Zubrod Score: At the time of surgery this patient's most appropriate activity status/level should be described as: []     0    Normal activity, no symptoms [x]     1    Restricted in physical strenuous activity but ambulatory, able to do  out light work []     2    Ambulatory and capable of self care, unable to do work activities, up and about               >50 % of waking hours                              []     3    Only limited self care, in bed greater than 50% of waking hours []     4    Completely disabled, no self care, confined to bed or chair []     5    Moribund   Past Medical History  Diagnosis Date  . Asthma   . Collagen vascular disease ([place])   . Hypertension   . Hyperlipidemia   . Depressed   . Anxiety   . Arthritis   . Cataract   . CAD (coronary artery disease)   . Hepatitis   . Seizures [facility])     Past Surgical History  Procedure Laterality Date  . Cardiac catheterization    . Coronary angioplasty with stent placement      x 3  . Eye surgery    . Hernia repair    . Joint replacement      Family History  Problem Relation Age  of Onset  . Cancer Father 35    Bone   . COPD Father   . Heart disease Mother   . Hyperlipidemia Mother   . Stroke Mother   . Heart disease Maternal Grandfather   . Hyperlipidemia Maternal Grandfather   Patient's family history significant for his father who died of bone cancer and COPD at age 75 his mother died of a stroke at age 69  Social History   Social History  . Marital Status: Married    Spouse Name: N/A  . Number of Children: 2  . Years of Education: N/A   Occupational History  . retired Theme park manager    Social History Main Topics  . Smoking status: Never Smoker   . Smokeless tobacco: Never Used  . Alcohol Use: 12.0 oz/week    6 Glasses of wine, 14 Shots of liquor per week  . Drug Use: Not on file  . Sexual Activity: Not on file   Other Topics Concern  . Not on file   Social History Narrative    History  Smoking status  . Never Smoker   Smokeless tobacco  . Never Used    History  Alcohol Use  . 12.0 oz/week  . 6 Glasses of wine, 14 Shots of liquor per week     Allergies  Allergen Reactions  . Codeine Nausea Only    Current  Outpatient Prescriptions  Medication Sig Dispense Refill  . albuterol (PROAIR HFA) 108 (90 Base) MCG/ACT inhaler Inhale 1-2 puffs into the lungs every 6 (six) hours as needed.     Marland Kitchen amLODipine (NORVASC) 5 MG tablet Take 5 mg by mouth daily.     Marland Kitchen aspirin EC 81 MG tablet Take 81 mg by mouth daily.     Marland Kitchen atorvastatin (LIPITOR) 80 MG tablet Take 80 mg by mouth daily at 6 PM.     . BREO ELLIPTA 100-25 MCG/INH AEPB Inhale 1 puff into the lungs daily.     Marland Kitchen dexlansoprazole (DEXILANT) 60 MG capsule Take 60 mg by mouth daily.     Marland Kitchen FLOVENT HFA 220 MCG/ACT inhaler INHALE 1 PUFF INTO THE LUNGS TWICE A DAY  12  . hydrochlorothiazide (HYDRODIURIL) 25 MG tablet Take by mouth.    . lamoTRIgine (LAMICTAL) 100 MG tablet 100 mg daily.     Marland Kitchen LORazepam (ATIVAN) 0.5 MG tablet Take 0.5 mg by mouth 2 (two) times daily.     . metoprolol succinate (TOPROL-XL) 50 MG 24 hr tablet TAKE 1 TABLET (50 MG TOTAL) BY MOUTH ONCE DAILY.  3  . NAMENDA XR 28 MG CP24 24 hr capsule Take 28 mg by mouth daily.     . nitroGLYCERIN (NITROLINGUAL) 0.4 MG/SPRAY spray Place 2 sprays under the tongue every 5 (five) minutes x 3 doses as needed.     Marland Kitchen PRISTIQ 100 MG 24 hr tablet Take 100 mg by mouth daily.     . traZODone (DESYREL) 100 MG tablet Take 100 mg by mouth at bedtime as needed.      No current facility-administered medications for this visit.      Review of Systems:     Cardiac Review of Systems: Y or N  Chest Pain [  y  ]  Resting SOB [ n  ] Exertional SOB  Blue.Reese  ]  Orthopnea [ n ]   Pedal Edema [  n ]    Palpitations Florencio.Farrier  ] Syncope  [ n ]   Presyncope [ n  ]  General Review of Systems: [Y] = yes [  ]=no Constitional: recent weight change [  ];  Wt loss over the last 3 months [   ] anorexia [  ]; fatigue [  ]; nausea [  ]; night sweats [  ]; fever [  ]; or chills [  ];          Dental: poor dentition[  ]; Last Dentist visit:   Eye : blurred vision [  ]; diplopia [   ]; vision changes [  ];  Amaurosis fugax[  ]; Resp: cough Blue.Reese   ];  wheezing[y  ];  hemoptysis[n  ]; shortness of breath[  ]; paroxysmal nocturnal dyspnea[  ]; dyspnea on exertion[ y ]; or orthopnea[  ];  GI:  gallstones[  ], vomiting[  ];  dysphagia[  ]; melena[  ];  hematochezia [  ]; heartburn[  ];   Hx of  Colonoscopy[  ]; GU: kidney stones [  ]; hematuria[  ];   dysuria [  ];  nocturia[  ];  history of     obstruction [  ]; urinary frequency [  ]             Skin: rash, swelling[  ];, hair loss[  ];  peripheral edema[  ];  or itching[  ]; Musculosketetal: myalgias[  ];  joint swelling[  ];  joint erythema[  ];  joint pain[  ];  back pain[  ];  Heme/Lymph: bruising[  ];  bleeding[  ];  anemia[  ];  Neuro: TIA[  ];  headaches[  ];  stroke[  ];  vertigo[  ];  seizures[  ];   paresthesias[  ];  difficulty walking[  ];  Psych:depression[  ]; anxiety[  ];  Endocrine: diabetes[  ];  thyroid dysfunction[  ];  Immunizations: Flu up to date [  ]; Pneumococcal up to date [  ];  Other:  Physical Exam: BP 138/80 mmHg  Pulse 67  Resp 20  Ht 5' 7.5" (1.715 m)  Wt 216 lb (97.977 kg)  BMI 33.31 kg/m2  SpO2 96%  PHYSICAL EXAMINATION: General appearance: alert, cooperative, appears older than stated age and no distress Head: Normocephalic, without obvious abnormality, atraumatic Neck: no adenopathy, no carotid bruit, no JVD, supple, symmetrical, trachea midline and thyroid not enlarged, symmetric, no tenderness/mass/nodules Lymph nodes: Cervical, supraclavicular, and axillary nodes normal. Resp: clear to auscultation bilaterally Back: symmetric, no curvature. ROM normal. No CVA tenderness. Cardio: regular rate and rhythm, S1, S2 normal, no murmur, click, rub or gallop GI: soft, non-tender; bowel sounds normal; no masses,  no organomegaly Extremities: extremities normal, atraumatic, no cyanosis or edema and Homans sign is negative, no sign of DVT Neurologic: Grossly normal  Diagnostic Studies & Laboratory data:     Recent Radiology Findings:   Ct Angio  Chest Pe W/cm &/or Wo Cm  05/07/2015  CLINICAL DATA:  Shortness of breath on exertion with left upper chest pain for 3 weeks. Recent severe bronchitis. Evaluate for pulmonary embolism. EXAM: CT ANGIOGRAPHY CHEST WITH CONTRAST TECHNIQUE: Multidetector CT imaging of the chest was performed using the standard protocol during bolus administration of intravenous contrast. Multiplanar CT image reconstructions and MIPs were obtained to evaluate the vascular anatomy. CONTRAST:  165mL OMNIPAQUE IOHEXOL 350 MG/ML SOLN COMPARISON:  None. FINDINGS: Mediastinum: The pulmonary arteries are adequately opacified with contrast. There is no evidence of acute pulmonary embolism. There is moderate atherosclerosis of the coronary arteries. The heart size is normal. There is  no pericardial effusion.There is a partially calcified anterior mediastinal mass, located the pre-vascular fat. This measures 4.6 x 2.6 cm on images 30. There are no enlarged mediastinal, hilar or axillary lymph nodes. The thyroid gland, trachea and esophagus demonstrate no significant findings. Lungs/Pleura: There is no pleural effusion.There is a 3 mm perifissural nodule along the right minor fissure (image 46). There is a tiny subpleural right middle lobe nodule on image 46. The small nodules are likely incidental and benign. No suspicious pulmonary nodules. The lungs are otherwise clear. Upper abdomen: The hepatic density is diffusely decreased consistent with steatosis. No focal hepatic abnormalities are seen. The adrenal glands appear normal. Musculoskeletal/Chest wall: No chest wall lesion or acute osseous findings. Review of the MIP images confirms the above findings. IMPRESSION: 1. No evidence of acute pulmonary embolism or other acute chest process. 2. 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 Less likely considerations include germ-cell tumor. Thoracic surgical consultation recommended. 3. Coronary artery  atherosclerosis. 4. Hepatic steatosis. 5. These results will be called to the ordering clinician or representative by the Radiologist Assistant, and communication documented in the PACS or zVision Dashboard. Electronically Signed   By: Richardean Sale M.D.   On: 05/07/2015 15:32   Nm Pet Image Initial (pi) Skull Base To Thigh  05/21/2015  CLINICAL DATA:  Initial treatment strategy for calcified anterior mediastinal mass detected on recent chest CT angiogram performed in the setting of shortness breath. EXAM: NUCLEAR MEDICINE PET SKULL BASE TO THIGH TECHNIQUE: 12.2 mCi F-18 FDG was injected intravenously. Full-ring PET imaging was performed from the skull base to thigh after the radiotracer. CT data was obtained and used for attenuation correction and anatomic localization. FASTING BLOOD GLUCOSE:  Value: 125 mg/dl COMPARISON:  05/07/2015 chest CT angiogram. FINDINGS: NECK No hypermetabolic lymph nodes in the neck. CHEST Partially calcified 4.6 x 2.2 cm irregular mass in the anterior mediastinum (series 3/image 91) is non hypermetabolic with max SUV 2.3 (compared to mediastinal blood pool activity with max SUV 3.0). There are nonenlarged hypermetabolic right paratracheal nodes, largest 0.7 cm (series 3/image 83) with max SUV 5.7. There is a hypermetabolic nonenlarged 0.8 cm right infrahilar node (series 3/image 96) with max SUV 8.0. There is a hypermetabolic nonenlarged 0.7 cm left hilar node (series 3/image 95) with max SUV 5.7. No hypermetabolic axillary nodes. Extensive coronary atherosclerosis. Stable 3 mm anterior right lower lobe pulmonary nodule (series 3/image 103), below PET resolution. No acute consolidative airspace disease or additional significant pulmonary nodules. No pleural effusions. ABDOMEN/PELVIS No abnormal hypermetabolic activity within the liver, pancreas, adrenal glands, or spleen. No hypermetabolic lymph nodes in the abdomen or pelvis. Mild-to-moderate diffuse hepatic steatosis. Mild  prostatomegaly. Small fat containing bilateral inguinal hernias. SKELETON No focal hypermetabolic activity to suggest skeletal metastasis. IMPRESSION: 1. Non hypermetabolic partially calcified 4.6 cm irregular anterior mediastinal mass. 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 2. Hypermetabolic subcentimeter right paratracheal and bilateral hilar lymph nodes, nonspecific, metastatic disease cannot be excluded. 3. Solitary 3 mm right lower lobe pulmonary nodule, below PET resolution. 4. No hypermetabolic extrathoracic disease. 5. Additional findings include coronary atherosclerosis, diffuse hepatic steatosis, mild prostatomegaly and small bilateral fat containing inguinal hernias. Electronically Signed   By: Ilona Sorrel M.D.   On: 05/21/2015 12:32     I have independently reviewed the above radiologic studies.  Recent Lab Findings: Lab Results  Component Value Date   WBC 5.1 04/10/2013   HGB 11.0* 04/10/2013  HCT 30.9* 04/10/2013   PLT 146* 04/10/2013   GLUCOSE 105* 03/02/2014   ALT 24 04/10/2013   AST 28 04/10/2013   NA 140 03/02/2014   K 4.1 03/02/2014   CL 105 03/02/2014   CREATININE 0.92 03/02/2014   BUN 11 03/02/2014   CO2 30 03/02/2014   INR 0.9 03/22/2013      Assessment / Plan:   #1  Non hypermetabolic partially calcified 4.6 cm irregular anterior mediastinal mass. 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 #2  Hypermetabolic subcentimeter right paratracheal and bilateral hilar lymph nodes, nonspecific, #3 known coronary occlusive disease #4 disability from previous stroke 1999 #5 anemia noted of unknown etiology that has not been evaluated  I discussed with the patient and his wife in detail the finding of the anterior mediastinal mass that is non-pet avid. 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 I've suggested the patient that we can follow this mass short-term with a follow-up CT scan in 3-4 months. I will see him at that time He will contact cardiology but his ongoing symptoms of chest discomfort in left arm numbness that is episodic  I  spent 40 minutes counseling the patient face to face and 50% or more the  time was spent in counseling and coordination of care. The total time spent in the appointment was 60 minutes.  [name] MD      [address] [place] [address] Office [phone]   Beeper [phone]  05/29/2015 2:58 PM

## 2015-07-17 DIAGNOSIS — D4989 Neoplasm of unspecified behavior of other specified sites: Secondary | ICD-10-CM | POA: Insufficient documentation

## 2015-08-01 ENCOUNTER — Other Ambulatory Visit: Payer: Self-pay | Admitting: Internal Medicine

## 2015-08-01 ENCOUNTER — Ambulatory Visit
Admission: RE | Admit: 2015-08-01 | Discharge: 2015-08-01 | Disposition: A | Discharge status: Home / Self Care | Payer: Medicare Other | Source: Ambulatory Visit | Attending: Internal Medicine | Admitting: Internal Medicine

## 2015-08-01 DIAGNOSIS — K7460 Unspecified cirrhosis of liver: Secondary | ICD-10-CM | POA: Diagnosis not present

## 2015-08-01 DIAGNOSIS — M79605 Pain in left leg: Principal | ICD-10-CM

## 2015-08-01 DIAGNOSIS — I517 Cardiomegaly: Secondary | ICD-10-CM | POA: Insufficient documentation

## 2015-08-01 DIAGNOSIS — R0602 Shortness of breath: Secondary | ICD-10-CM

## 2015-08-01 DIAGNOSIS — M79604 Pain in right leg: Secondary | ICD-10-CM | POA: Insufficient documentation

## 2015-08-01 DIAGNOSIS — R222 Localized swelling, mass and lump, trunk: Secondary | ICD-10-CM | POA: Diagnosis not present

## 2015-08-01 DIAGNOSIS — R0789 Other chest pain: Secondary | ICD-10-CM | POA: Insufficient documentation

## 2015-08-01 DIAGNOSIS — I2510 Atherosclerotic heart disease of native coronary artery without angina pectoris: Secondary | ICD-10-CM | POA: Diagnosis not present

## 2015-08-01 DIAGNOSIS — R911 Solitary pulmonary nodule: Secondary | ICD-10-CM | POA: Diagnosis not present

## 2015-08-01 MED ORDER — IOPAMIDOL (ISOVUE-370) INJECTION 76%
100.0000 mL | Freq: Once | INTRAVENOUS | Status: AC | PRN
  Administered 2015-08-01: 100 mL via INTRAVENOUS

## 2015-09-12 ENCOUNTER — Ambulatory Visit (INDEPENDENT_AMBULATORY_CARE_PROVIDER_SITE_OTHER): Payer: Medicare Other | Admitting: Cardiothoracic Surgery

## 2015-09-12 ENCOUNTER — Encounter: Payer: Self-pay | Admitting: Cardiothoracic Surgery

## 2015-09-12 ENCOUNTER — Other Ambulatory Visit: Payer: Self-pay | Admitting: *Deleted

## 2015-09-12 VITALS — BP 121/74 | HR 67 | Resp 20 | Ht 67.5 in | Wt 219.0 lb

## 2015-09-12 DIAGNOSIS — J9859 Other diseases of mediastinum, not elsewhere classified: Secondary | ICD-10-CM | POA: Diagnosis not present

## 2015-09-12 DIAGNOSIS — D4989 Neoplasm of unspecified behavior of other specified sites: Secondary | ICD-10-CM

## 2015-09-12 NOTE — Progress Notes (Signed)
[address]       [place] [address]             [phone]                    [facility] Medical Record [id number] Date of Birth: 09-02-1945  Referring: Nestor Lewandowsky, MD Primary Care: Idelle Crouch, MD  Chief Complaint:    Chief Complaint  Patient presents with  . Mediastinal Mass    4 month f/u with Chest CT 08/01/15  . Follow-up    History of Present Illness:    Johnny Mccoy 70 y.o. male is seen in the office   In follow up from 5 months ago when seen for  A calcified anterior mediastinal mass, not PET avid. The patient gives a long history of known coronary occlusive disease having had numerous cardiac catheterizations and stents in the past. He is a non-smoker. Because of recent recurrent episodes of bronchitis before and after a recent trip to [place] CT of the chest was performed. 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 Subsequent PET scan has been performed and the mass is non-avid. He did have some nonenlarged mildly hypermetabolic mediastinal nodes.   The patient has an extensive cardiac history and although he wishes to attribute these symptoms to the mediastinal mass it is unlikely that the incidental finding of this mass is related to any of the symptoms, he complains of deep burning chest discomfort associated with arm numbness, it comes on both at rest and with activity. 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 Since last seen he has had follow-up with cardiology including echocardiogram and Myoview stress test-without evidence of ischemia  He had a CT scan 6 weeks ago when he presented to primary care with swollen legs, Dopplers showed no evidence of DVT, CT of the chest showed no evidence of pulmonary emboli. The anterior  mediastinal mass was unchanged in size and multiple small pulmonary nodules on the right were also unchanged.   The patient continues to be concerned about the  mediastinal mass and would like to proceed with surgical resection   Current Activity/ Functional Status:  Patient is independent with mobility/ambulation, transfers, ADL's, IADL's.   Zubrod Score: At the time of surgery this patient's most appropriate activity status/level should be described as: []     0    Normal activity, no symptoms [x]     1    Restricted in physical strenuous activity but ambulatory, able to do out light work []     2    Ambulatory and capable of self care, unable to do work activities, up and about               >50 % of waking hours                              []     3    Only limited self care, in bed greater than 50% of waking hours []     4    Completely disabled, no self care, confined to bed or chair []     5    Moribund   Past Medical History  Diagnosis Date  . Asthma   .  Hypertension   . Hyperlipidemia   . Depressed   . Anxiety   . Arthritis   . Cataract   . CAD (coronary artery disease)   . Hepatitis   . Seizures [facility])     Past Surgical History  Procedure Laterality Date  . Cardiac catheterization    . Coronary angioplasty with stent placement      x 3  . Eye surgery    . Hernia repair    . Joint replacement      Family History  Problem Relation Age of Onset  . Cancer Father 33    Bone   . COPD Father   . Heart disease Mother   . Hyperlipidemia Mother   . Stroke Mother   . Heart disease Maternal Grandfather   . Hyperlipidemia Maternal Grandfather   Patient's family history significant for his father who died of bone cancer and COPD at age 20 his mother died of a stroke at age 62  Social History   Social History  . Marital Status: Married    Spouse Name: N/A  . Number of Children: 2  . Years of Education: N/A   Occupational History  . retired Theme park manager    Social  History Main Topics  . Smoking status: Never Smoker   . Smokeless tobacco: Never Used  . Alcohol Use: 12.0 oz/week    6 Glasses of wine, 14 Shots of liquor per week  . Drug Use: Not on file  . Sexual Activity: Not on file   Other Topics Concern  . Not on file   Social History Narrative    History  Smoking status  . Never Smoker   Smokeless tobacco  . Never Used    History  Alcohol Use  . 12.0 oz/week  . 6 Glasses of wine, 14 Shots of liquor per week     Allergies  Allergen Reactions  . Codeine Nausea Only    Current Outpatient Prescriptions  Medication Sig Dispense Refill  . albuterol (PROAIR HFA) 108 (90 Base) MCG/ACT inhaler Inhale 1-2 puffs into the lungs every 6 (six) hours as needed.     Marland Kitchen amLODipine (NORVASC) 5 MG tablet Take 5 mg by mouth daily.     Marland Kitchen aspirin EC 81 MG tablet Take 81 mg by mouth daily.     Marland Kitchen atorvastatin (LIPITOR) 80 MG tablet Take 80 mg by mouth daily at 6 PM.     . BREO ELLIPTA 100-25 MCG/INH AEPB Inhale 1 puff into the lungs daily.     . cetirizine (ZYRTEC) 10 MG tablet Take 10 mg by mouth daily.    Marland Kitchen dexlansoprazole (DEXILANT) 60 MG capsule Take 60 mg by mouth daily.     Marland Kitchen FLOVENT HFA 220 MCG/ACT inhaler INHALE 1 PUFF INTO THE LUNGS TWICE A DAY  12  . hydrochlorothiazide (HYDRODIURIL) 25 MG tablet Take by mouth.    . lamoTRIgine (LAMICTAL) 100 MG tablet 100 mg daily.     Marland Kitchen LORazepam (ATIVAN) 0.5 MG tablet Take 0.5 mg by mouth 2 (two) times daily.     . metoprolol succinate (TOPROL-XL) 50 MG 24 hr tablet TAKE 1 TABLET (50 MG TOTAL) BY MOUTH ONCE DAILY.  3  . NAMENDA XR 28 MG CP24 24 hr capsule Take 28 mg by mouth daily.     . nitroGLYCERIN (NITROLINGUAL) 0.4 MG/SPRAY spray Place 2 sprays under the tongue every 5 (five) minutes x 3 doses as needed.     Marland Kitchen PRISTIQ 100 MG 24 hr  tablet Take 100 mg by mouth daily.     . traZODone (DESYREL) 100 MG tablet Take 100 mg by mouth at bedtime as needed.      No current facility-administered medications  for this visit.      Review of Systems:     Cardiac Review of Systems: Y or N  Chest Pain [  y  ]  Resting SOB [ n  ] Exertional SOB  Blue.Reese  ]  Orthopnea [ n ]   Pedal Edema [  n ]    Palpitations [n  ] Syncope  [ n ]   Presyncope [ n  ]  General Review of Systems: [Y] = yes [  ]=no Constitional: recent weight change [  ];  Wt loss over the last 3 months [   ] anorexia [  ]; fatigue [  ]; nausea [  ]; night sweats [  ]; fever [  ]; or chills [  ];          Dental: poor dentition[  ]; Last Dentist visit:   Eye : blurred vision [  ]; diplopia [   ]; vision changes [  ];  Amaurosis fugax[  ]; Resp: cough Blue.Reese  ];  wheezing[y  ];  hemoptysis[n  ]; shortness of breath[  ]; paroxysmal nocturnal dyspnea[  ]; dyspnea on exertion[ y ]; or orthopnea[  ];  GI:  gallstones[  ], vomiting[  ];  dysphagia[  ]; melena[  ];  hematochezia [  ]; heartburn[  ];   Hx of  Colonoscopy[  ]; GU: kidney stones [  ]; hematuria[  ];   dysuria [  ];  nocturia[  ];  history of     obstruction [  ]; urinary frequency [  ]             Skin: rash, swelling[  ];, hair loss[  ];  peripheral edema[  ];  or itching[  ]; Musculosketetal: myalgias[  ];  joint swelling[  ];  joint erythema[  ];  joint pain[  ];  back pain[  ];  Heme/Lymph: bruising[  ];  bleeding[  ];  anemia[  ];  Neuro: TIA[  ];  headaches[  ];  stroke[  ];  vertigo[  ];  seizures[  ];   paresthesias[  ];  difficulty walking[  ];  Psych:depression[  ]; anxiety[  ];  Endocrine: diabetes[  ];  thyroid dysfunction[  ];  Immunizations: Flu up to date [  ]; Pneumococcal up to date [  ];  Other:  Physical Exam: BP 121/74 mmHg  Pulse 67  Resp 20  Ht 5' 7.5" (1.715 m)  Wt 219 lb (99.338 kg)  BMI 33.77 kg/m2  SpO2 94%  PHYSICAL EXAMINATION: General appearance: alert, cooperative, appears older than stated age and no distress Head: Normocephalic, without obvious abnormality, atraumatic Neck: no adenopathy, no carotid bruit, no JVD, supple, symmetrical, trachea  midline and thyroid not enlarged, symmetric, no tenderness/mass/nodules Lymph nodes: Cervical, supraclavicular, and axillary nodes normal. Resp: clear to auscultation bilaterally Back: symmetric, no curvature. ROM normal. No CVA tenderness. Cardio: regular rate and rhythm, S1, S2 normal, no murmur, click, rub or gallop GI: soft, non-tender; bowel sounds normal; no masses,  no organomegaly Extremities: extremities normal, atraumatic, no cyanosis ,Homans sign is negative, no sign of DVT, very mild pedal edema bilaterally, pulses are intact Neurologic: Grossly normal  Diagnostic Studies & Laboratory data:     Recent Radiology Findings:  CLINICAL  DATA: Mid chest pain and fatigue. Short of breath and bilateral lower extremity weakness. History of hepatitis.  EXAM: CT ANGIOGRAPHY CHEST WITH CONTRAST  TECHNIQUE: Multidetector CT imaging of the chest was performed using the standard protocol during bolus administration of intravenous contrast. Multiplanar CT image reconstructions and MIPs were obtained to evaluate the vascular anatomy.  CONTRAST: 80 cc of Isovue 370  COMPARISON: 05/07/2015  FINDINGS: Mediastinum/Nodes: The quality of this exam for evaluation of pulmonary embolism is moderate. The primary limitation is suboptimal bolus timing, with contrast centered in the SVC. There is also minimal motion degradation in the lower chest. No pulmonary embolism to the large segmental level.  Tortuous thoracic aorta. Mild cardiomegaly. Multivessel coronary artery atherosclerosis. No pericardial effusion. No mediastinal or hilar adenopathy.  Anterior mediastinal partially calcified mass is again identified, on the order of 4.2 x 2.5 cm versus similar on the prior.  Lungs/Pleura: No pleural fluid. 3 mm right lower lobe pulmonary nodule is unchanged on image 46/ series 5.  No lobar consolidation.  Upper abdomen: Caudate lobe enlargement with subtle medial segment left  liver lobe atrophy. Probable cyst in the medial segment left liver lobe. Normal imaged portions of the spleen, stomach, pancreas, gallbladder, adrenal glands, kidneys.  Musculoskeletal: No acute osseous abnormality.  Review of the MIP images confirms the above findings.  IMPRESSION: 1. No evidence of pulmonary embolism to the large segmental level. Mildly degraded exam secondary to suboptimal bolus timing. 2. Cardiomegaly. Atherosclerosis, including within the coronary arteries. 3. Mild cirrhosis. 4. Anterior mediastinal mass, similar. Please see prior CT and PET reports. 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 5. Nonspecific right lower lobe 3 mm pulmonary nodule, as before. These results will be called to the ordering clinician or representative by the Radiology Department at the imaging location.   Electronically Signed  By: Abigail Miyamoto M.D.  On: 08/01/2015 11:08     Ct Angio Chest Pe W/cm &/or Wo Cm  05/07/2015  CLINICAL DATA:  Shortness of breath on exertion with left upper chest pain for 3 weeks. Recent severe bronchitis. Evaluate for pulmonary embolism. EXAM: CT ANGIOGRAPHY CHEST WITH CONTRAST TECHNIQUE: Multidetector CT imaging of the chest was performed using the standard protocol during bolus administration of intravenous contrast. Multiplanar CT image reconstructions and MIPs were obtained to evaluate the vascular anatomy. CONTRAST:  125mL OMNIPAQUE IOHEXOL 350 MG/ML SOLN COMPARISON:  None. FINDINGS: Mediastinum: The pulmonary arteries are adequately opacified with contrast. There is no evidence of acute pulmonary embolism. There is moderate atherosclerosis of the coronary arteries. The heart size is normal. There is no pericardial effusion.There is a partially calcified anterior mediastinal mass, located the pre-vascular fat. This measures 4.6 x 2.6 cm on images 30. There are no enlarged mediastinal, hilar or axillary lymph nodes. The  thyroid gland, trachea and esophagus demonstrate no significant findings. Lungs/Pleura: There is no pleural effusion.There is a 3 mm perifissural nodule along the right minor fissure (image 46). There is a tiny subpleural right middle lobe nodule on image 46. The small nodules are likely incidental and benign. No suspicious pulmonary nodules. The lungs are otherwise clear. Upper abdomen: The hepatic density is diffusely decreased consistent with steatosis. No focal hepatic abnormalities are seen. The adrenal glands appear normal. Musculoskeletal/Chest wall: No chest wall lesion or acute osseous findings. Review of the MIP images confirms the above findings. IMPRESSION: 1. No evidence of acute pulmonary embolism or other acute chest process. 2. 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 Less likely considerations include germ-cell tumor. Thoracic surgical consultation recommended. 3. Coronary artery atherosclerosis. 4. Hepatic steatosis. 5. These results will be called to the ordering clinician or representative by the Radiologist Assistant, and communication documented in the PACS or zVision Dashboard. Electronically Signed   By: Richardean Sale M.D.   On: 05/07/2015 15:32   Nm Pet Image Initial (pi) Skull Base To Thigh  05/21/2015  CLINICAL DATA:  Initial treatment strategy for calcified anterior mediastinal mass detected on recent chest CT angiogram performed in the setting of shortness breath. EXAM: NUCLEAR MEDICINE PET SKULL BASE TO THIGH TECHNIQUE: 12.2 mCi F-18 FDG was injected intravenously. Full-ring PET imaging was performed from the skull base to thigh after the radiotracer. CT data was obtained and used for attenuation correction and anatomic localization. FASTING BLOOD GLUCOSE:  Value: 125 mg/dl COMPARISON:  05/07/2015 chest CT angiogram. FINDINGS: NECK No hypermetabolic lymph nodes in the neck. CHEST Partially calcified 4.6 x 2.2 cm irregular mass in the  anterior mediastinum (series 3/image 91) is non hypermetabolic with max SUV 2.3 (compared to mediastinal blood pool activity with max SUV 3.0). There are nonenlarged hypermetabolic right paratracheal nodes, largest 0.7 cm (series 3/image 83) with max SUV 5.7. There is a hypermetabolic nonenlarged 0.8 cm right infrahilar node (series 3/image 96) with max SUV 8.0. There is a hypermetabolic nonenlarged 0.7 cm left hilar node (series 3/image 95) with max SUV 5.7. No hypermetabolic axillary nodes. Extensive coronary atherosclerosis. Stable 3 mm anterior right lower lobe pulmonary nodule (series 3/image 103), below PET resolution. No acute consolidative airspace disease or additional significant pulmonary nodules. No pleural effusions. ABDOMEN/PELVIS No abnormal hypermetabolic activity within the liver, pancreas, adrenal glands, or spleen. No hypermetabolic lymph nodes in the abdomen or pelvis. Mild-to-moderate diffuse hepatic steatosis. Mild prostatomegaly. Small fat containing bilateral inguinal hernias. SKELETON No focal hypermetabolic activity to suggest skeletal metastasis. IMPRESSION: 1. Non hypermetabolic partially calcified 4.6 cm irregular anterior mediastinal mass. 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 2. Hypermetabolic subcentimeter right paratracheal and bilateral hilar lymph nodes, nonspecific, metastatic disease cannot be excluded. 3. Solitary 3 mm right lower lobe pulmonary nodule, below PET resolution. 4. No hypermetabolic extrathoracic disease. 5. Additional findings include coronary atherosclerosis, diffuse hepatic steatosis, mild prostatomegaly and small bilateral fat containing inguinal hernias. Electronically Signed   By: Ilona Sorrel M.D.   On: 05/21/2015 12:32     I have independently reviewed the above radiologic studies.  Recent Lab Findings: Lab Results  Component Value Date   WBC 5.1 04/10/2013   HGB 11.0* 04/10/2013   HCT 30.9* 04/10/2013   PLT  146* 04/10/2013   GLUCOSE 105* 03/02/2014   ALT 24 04/10/2013   AST 28 04/10/2013   NA 140 03/02/2014   K 4.1 03/02/2014   CL 105 03/02/2014   CREATININE 0.92 03/02/2014   BUN 11 03/02/2014   CO2 30 03/02/2014   INR 0.9 03/22/2013   Stress Test  Done [place] outpatient : 06/20/2015  Normal treadmill EKG without evidence of ischemia or arrhythmia Normal myocardial perfusion without evidence of myocardial ischemia  although there is some artifact  Serafina Royals   Result Narrative  [facility] [place], [place], [place]  [address] [phone]  Procedure: Exercise Myocardial Perfusion Imaging   ONE day procedure Indication: Coronary artery disease of native artery of native heart with  stable angina pectoris (CMS-HCC) - Plan: NM myocardial perfusion  SPECT  multiple (stress and rest), ECG stress test only Stable angina pectoris (CMS-HCC) - Plan: NM myocardial perfusion SPECT  multiple (stress and rest), ECG stress test only Ordering Physician:  Dr. Serafina Royals Clinical History: 70 y.o. year old male Vitals: Height: 20 in  Weight: 216 lb Cardiac risk factors include:   VHD, Hyperlipidemia, PCI, HTN and CAD  Procedure: The patient performed treadmill exercise using a Bruce protocol for 9:01  minutes. The exercise test was stopped due to fatigue.  Blood pressure  response was normal.  Rest HR: 67bpm Rest BP: 134/3mmHg Max HR: 121bpm Max BP: 164/73mmHg Mets:     10.4 % MAX HR:   80% Stress Test Administered by: Oswald Hillock, CMA ECG Interpretation: Rest ECG:  normal sinus rhythm, none Stress ECG:  normal sinus rhythm,   Recovery ECG:  normal sinus rhythm ECG Interpretation:  negative, no ECG changes. Administrations This Visit    technetium Tc11m sestamibi (CARDIOLITE) injection [id number] millicurie    Admin Date Action Dose Route Administered By   [id number] Given [id number] millicurie Intravenous Ane Payment, CNMT        technetium Tc65m sestamibi (CARDIOLITE) injection [id number] millicurie    Admin Date Action Dose Route Administered By  [id number] Given [id number] millicurie Intravenous Scott N Goard, CNMT         Gated post-stress perfusion imaging was performed 30 minutes after stress.  Rest images were performed 30 minutes after injection.  Gated LV Analysis:  TID:  1.1  LVEF= 52%  FINDINGS: Regional wall motion:  reveals normal myocardial thickening and wall  motion. The overall quality of the study is fair.   Artifacts noted: yes Left ventricular cavity: normal.  Perfusion Analysis:  SPECT images demonstrate homogeneous tracer  distribution throughout the myocardium.        Assessment / Plan:   #1  Non hypermetabolic partially calcified 4.6 cm irregular anterior mediastinal mass. 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 #2  Hypermetabolic subcentimeter right paratracheal and bilateral hilar lymph nodes, nonspecific, #3 known coronary occlusive disease #4 disability from previous stroke 1999  I discussed with the patient and his wife proceeding with resection of the thymic tumor through partial upper sternotomy . Options with left VATS were also discussed. We plan to proceed with resection in late August after the patient has oral surgery for, infected/ deteriorated  dental root for which she needs a dental implant.   We will need cardiology clearance prior to surgery, he has had a recent nuclear stress test done in [place].  I'll plan to see the patient back August 17, and proceed with surgical resection on August 28.  [name] MD      [address] [place] [address] Office [phone]   Beeper [phone]  09/12/2015 11:40 AM

## 2015-10-07 ENCOUNTER — Ambulatory Visit (HOSPITAL_COMMUNITY)
Admission: RE | Admit: 2015-10-07 | Discharge: 2015-10-07 | Disposition: A | Discharge status: Home / Self Care | Payer: Medicare Other | Source: Ambulatory Visit | Attending: Cardiothoracic Surgery | Admitting: Cardiothoracic Surgery

## 2015-10-07 DIAGNOSIS — D383 Neoplasm of uncertain behavior of mediastinum: Secondary | ICD-10-CM | POA: Diagnosis present

## 2015-10-07 DIAGNOSIS — D4989 Neoplasm of unspecified behavior of other specified sites: Secondary | ICD-10-CM

## 2015-10-07 LAB — PULMONARY FUNCTION TEST
DL/VA % pred: 107 %
DL/VA: 4.74 ml/min/mmHg/L
DLCO unc % pred: 83 %
DLCO unc: 23.56 ml/min/mmHg
FEF 25-75 Pre: 3 L/sec
FEF2575-%Pred-Pre: 134 %
FEV1-%Pred-Pre: 89 %
FEV1-Pre: 2.61 L
FEV1FVC-%Pred-Pre: 110 %
FEV6-%Pred-Pre: 84 %
FEV6-Pre: 3.16 L
FEV6FVC-%Pred-Pre: 105 %
FVC-%Pred-Pre: 80 %
FVC-Pre: 3.18 L
Pre FEV1/FVC ratio: 82 %
Pre FEV6/FVC Ratio: 99 %
RV % pred: 83 %
RV: 1.9 L
TLC % pred: 83 %
TLC: 5.35 L

## 2015-10-07 NOTE — Progress Notes (Signed)
Unable to post BD PFT because pt had used breo inhaler this am.

## 2015-10-17 ENCOUNTER — Ambulatory Visit (INDEPENDENT_AMBULATORY_CARE_PROVIDER_SITE_OTHER): Payer: Medicare Other | Admitting: Cardiothoracic Surgery

## 2015-10-17 ENCOUNTER — Encounter: Payer: Self-pay | Admitting: Cardiothoracic Surgery

## 2015-10-17 VITALS — BP 132/78 | HR 66 | Resp 16 | Ht 67.5 in | Wt 219.0 lb

## 2015-10-17 DIAGNOSIS — J9859 Other diseases of mediastinum, not elsewhere classified: Secondary | ICD-10-CM

## 2015-10-17 NOTE — Progress Notes (Signed)
[address]       ,[place] [address]             [phone]                    Jamason A Hino [place] Medical Record [id number] Date of Birth: 27-May-1945  Referring: Nestor Lewandowsky, MD Primary Care: Idelle Crouch, MD  Chief Complaint:    Chief Complaint  Patient presents with  . Mediastinal Mass    further discuss surgery scheduled for 10/28/2015    History of Present Illness:    Johnny Mccoy 70 y.o. male is seen in the office   In follow up from 5 months ago when seen for  A calcified anterior mediastinal mass, not PET avid. The patient gives a long history of known coronary occlusive disease having had numerous cardiac catheterizations and stents in the past. He is a non-smoker. Because of recurrent episodes of bronchitis before and after a recent trip to [place] CT of the chest was performed. 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 Subsequent PET scan has been performed and the mass is non-avid. He did have some nonenlarged mildly hypermetabolic mediastinal nodes.   The patient has an extensive cardiac history and although he wishes to attribute these symptoms to the mediastinal mass it is unlikely that the incidental finding of this mass is related to any of the symptoms, he complains of deep burning chest discomfort associated with arm numbness, it comes on both at rest and with activity. 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 Since last seen he has had follow-up with cardiology including echocardiogram and Myoview stress test-without evidence of ischemia  He had a CT scan 6 weeks ago when he presented to primary care with swollen legs, Dopplers showed no evidence of DVT, CT of the chest showed no evidence of pulmonary emboli. The anterior mediastinal  mass was unchanged in size and multiple small pulmonary nodules on the right were also unchanged.   The patient continues to be concerned about the  mediastinal mass and would like to proceed with surgical resection   Last week the patient did note a "crick in his neck: And soreness in his right arm which is slowly improving.   Current Activity/ Functional Status:  Patient is independent with mobility/ambulation, transfers, ADL's, IADL's.   Zubrod Score: At the time of surgery this patient's most appropriate activity status/level should be described as: []     0    Normal activity, no symptoms [x]     1    Restricted in physical strenuous activity but ambulatory, able to do out light work []     2    Ambulatory and capable of self care, unable to do work activities, up and about               >50 % of waking hours                              []     3    Only limited self care, in bed greater than 50% of waking hours []     4    Completely disabled, no self care, confined to bed or chair []   5    Moribund   Past Medical History:  Diagnosis Date  . Anxiety   . Arthritis   . Asthma   . CAD (coronary artery disease)   . Cataract   . Depressed   . Hepatitis   . Hyperlipidemia   . Hypertension   . Seizures ([place])     Past Surgical History:  Procedure Laterality Date  . CARDIAC CATHETERIZATION    . CORONARY ANGIOPLASTY WITH STENT PLACEMENT     x 3  . EYE SURGERY    . HERNIA REPAIR    . JOINT REPLACEMENT      Family History  Problem Relation Age of Onset  . Cancer Father 3    Bone   . COPD Father   . Heart disease Mother   . Hyperlipidemia Mother   . Stroke Mother   . Heart disease Maternal Grandfather   . Hyperlipidemia Maternal Grandfather   Patient's family history significant for his father who died of bone cancer and COPD at age 2 his mother died of a stroke at age 83  Social History   Social History  . Marital status: Married    Spouse name: N/A  . Number  of children: 2  . Years of education: N/A   Occupational History  . retired Theme park manager    Social History Main Topics  . Smoking status: Never Smoker  . Smokeless tobacco: Never Used  . Alcohol use 12.0 oz/week    6 Glasses of wine, 14 Shots of liquor per week  . Drug use: Unknown  . Sexual activity: Not on file   Other Topics Concern  . Not on file   Social History Narrative  . No narrative on file    History  Smoking Status  . Never Smoker  Smokeless Tobacco  . Never Used    History  Alcohol Use  . 12.0 oz/week  . 6 Glasses of wine, 14 Shots of liquor per week     Allergies  Allergen Reactions  . Codeine Nausea Only    Current Outpatient Prescriptions  Medication Sig Dispense Refill  . albuterol (PROAIR HFA) 108 (90 Base) MCG/ACT inhaler Inhale 1-2 puffs into the lungs every 6 (six) hours as needed.     Marland Kitchen amLODipine (NORVASC) 5 MG tablet Take 5 mg by mouth daily.     Marland Kitchen aspirin EC 81 MG tablet Take 81 mg by mouth daily.     Marland Kitchen atorvastatin (LIPITOR) 80 MG tablet Take 80 mg by mouth daily at 6 PM.     . BREO ELLIPTA 100-25 MCG/INH AEPB Inhale 1 puff into the lungs daily.     . cetirizine (ZYRTEC) 10 MG tablet Take 10 mg by mouth daily.    Marland Kitchen dexlansoprazole (DEXILANT) 60 MG capsule Take 60 mg by mouth daily.     Marland Kitchen FLOVENT HFA 220 MCG/ACT inhaler INHALE 1 PUFF INTO THE LUNGS TWICE A DAY  12  . hydrochlorothiazide (HYDRODIURIL) 25 MG tablet Take by mouth.    . lamoTRIgine (LAMICTAL) 100 MG tablet 100 mg daily.     Marland Kitchen LORazepam (ATIVAN) 0.5 MG tablet Take 0.5 mg by mouth 2 (two) times daily.     . metoprolol succinate (TOPROL-XL) 50 MG 24 hr tablet TAKE 1 TABLET (50 MG TOTAL) BY MOUTH ONCE DAILY.  3  . NAMENDA XR 28 MG CP24 24 hr capsule Take 28 mg by mouth daily.     Marland Kitchen PRISTIQ 100 MG 24 hr tablet Take 100 mg by  mouth daily.     . traZODone (DESYREL) 100 MG tablet Take 100 mg by mouth at bedtime as needed.     . nitroGLYCERIN (NITROLINGUAL) 0.4 MG/SPRAY spray Place 2  sprays under the tongue every 5 (five) minutes x 3 doses as needed.      No current facility-administered medications for this visit.       Review of Systems:     Cardiac Review of Systems: Y or N  Chest Pain [  y  ]  Resting SOB [ n  ] Exertional SOB  Blue.Reese  ]  Orthopnea [ n ]   Pedal Edema [  n ]    Palpitations [n  ] Syncope  [ n ]   Presyncope [ n  ]  General Review of Systems: [Y] = yes [  ]=no Constitional: recent weight change [  ];  Wt loss over the last 3 months [   ] anorexia [  ]; fatigue [  ]; nausea [  ]; night sweats [  ]; fever [  ]; or chills [  ];          Dental: poor dentition[  ]; Last Dentist visit:   Eye : blurred vision [  ]; diplopia [   ]; vision changes [  ];  Amaurosis fugax[  ]; Resp: cough Blue.Reese  ];  wheezing[y  ];  hemoptysis[n  ]; shortness of breath[  ]; paroxysmal nocturnal dyspnea[  ]; dyspnea on exertion[ y ]; or orthopnea[  ];  GI:  gallstones[  ], vomiting[  ];  dysphagia[  ]; melena[  ];  hematochezia [  ]; heartburn[  ];   Hx of  Colonoscopy[  ]; GU: kidney stones [  ]; hematuria[  ];   dysuria [  ];  nocturia[  ];  history of     obstruction [  ]; urinary frequency [  ]             Skin: rash, swelling[  ];, hair loss[  ];  peripheral edema[  ];  or itching[  ]; Musculosketetal: myalgias[  ];  joint swelling[  ];  joint erythema[  ];  joint pain[  ];  back pain[  ];  Heme/Lymph: bruising[  ];  bleeding[  ];  anemia[  ];  Neuro: TIA[  ];  headaches[  ];  stroke[  ];  vertigo[  ];  seizures[  ];   paresthesias[  ];  difficulty walking[  ];  Psych:depression[  ]; anxiety[  ];  Endocrine: diabetes[  ];  thyroid dysfunction[  ];  Immunizations: Flu up to date [  ]; Pneumococcal up to date [  ];  Other:  Physical Exam: BP 132/78   Pulse 66   Resp 16   Ht 5' 7.5" (1.715 m)   Wt 219 lb (99.3 kg)   SpO2 94% Comment: RA  BMI 33.79 kg/m   PHYSICAL EXAMINATION: General appearance: alert, cooperative, appears older than stated age and no distress Head:  Normocephalic, without obvious abnormality, atraumatic Neck: no adenopathy, no carotid bruit, no JVD, supple, symmetrical, trachea midline and thyroid not enlarged, symmetric, no tenderness/mass/nodules Lymph nodes: Cervical, supraclavicular, and axillary nodes normal. Resp: clear to auscultation bilaterally Back: symmetric, no curvature. ROM normal. No CVA tenderness. Cardio: regular rate and rhythm, S1, S2 normal, no murmur, click, rub or gallop GI: soft, non-tender; bowel sounds normal; no masses,  no organomegaly Extremities: extremities normal, atraumatic, no cyanosis ,Homans sign is negative, no sign  of DVT, very mild pedal edema bilaterally, pulses are intact, his upper extremities have good and normal strength bilaterally Neurologic: Grossly normal  Diagnostic Studies & Laboratory data:     Recent Radiology Findings:  CLINICAL DATA: Mid chest pain and fatigue. Short of breath and bilateral lower extremity weakness. History of hepatitis.  EXAM: CT ANGIOGRAPHY CHEST WITH CONTRAST  TECHNIQUE: Multidetector CT imaging of the chest was performed using the standard protocol during bolus administration of intravenous contrast. Multiplanar CT image reconstructions and MIPs were obtained to evaluate the vascular anatomy.  CONTRAST: 80 cc of Isovue 370  COMPARISON: 05/07/2015  FINDINGS: Mediastinum/Nodes: The quality of this exam for evaluation of pulmonary embolism is moderate. The primary limitation is suboptimal bolus timing, with contrast centered in the SVC. There is also minimal motion degradation in the lower chest. No pulmonary embolism to the large segmental level.  Tortuous thoracic aorta. Mild cardiomegaly. Multivessel coronary artery atherosclerosis. No pericardial effusion. No mediastinal or hilar adenopathy.  Anterior mediastinal partially calcified mass is again identified, on the order of 4.2 x 2.5 cm versus similar on the prior.  Lungs/Pleura: No  pleural fluid. 3 mm right lower lobe pulmonary nodule is unchanged on image 46/ series 5.  No lobar consolidation.  Upper abdomen: Caudate lobe enlargement with subtle medial segment left liver lobe atrophy. Probable cyst in the medial segment left liver lobe. Normal imaged portions of the spleen, stomach, pancreas, gallbladder, adrenal glands, kidneys.  Musculoskeletal: No acute osseous abnormality.  Review of the MIP images confirms the above findings.  IMPRESSION: 1. No evidence of pulmonary embolism to the large segmental level. Mildly degraded exam secondary to suboptimal bolus timing. 2. Cardiomegaly. Atherosclerosis, including within the coronary arteries. 3. Mild cirrhosis. 4. Anterior mediastinal mass, similar. Please see prior CT and PET reports. 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 5. Nonspecific right lower lobe 3 mm pulmonary nodule, as before. These results will be called to the ordering clinician or representative by the Radiology Department at the imaging location.   Electronically Signed  By: Abigail Miyamoto M.D.  On: 08/01/2015 11:08     Ct Angio Chest Pe W/cm &/or Wo Cm  05/07/2015  CLINICAL DATA:  Shortness of breath on exertion with left upper chest pain for 3 weeks. Recent severe bronchitis. Evaluate for pulmonary embolism. EXAM: CT ANGIOGRAPHY CHEST WITH CONTRAST TECHNIQUE: Multidetector CT imaging of the chest was performed using the standard protocol during bolus administration of intravenous contrast. Multiplanar CT image reconstructions and MIPs were obtained to evaluate the vascular anatomy. CONTRAST:  138mL OMNIPAQUE IOHEXOL 350 MG/ML SOLN COMPARISON:  None. FINDINGS: Mediastinum: The pulmonary arteries are adequately opacified with contrast. There is no evidence of acute pulmonary embolism. There is moderate atherosclerosis of the coronary arteries. The heart size is normal. There is no pericardial effusion.There is  a partially calcified anterior mediastinal mass, located the pre-vascular fat. This measures 4.6 x 2.6 cm on images 30. There are no enlarged mediastinal, hilar or axillary lymph nodes. The thyroid gland, trachea and esophagus demonstrate no significant findings. Lungs/Pleura: There is no pleural effusion.There is a 3 mm perifissural nodule along the right minor fissure (image 46). There is a tiny subpleural right middle lobe nodule on image 46. The small nodules are likely incidental and benign. No suspicious pulmonary nodules. The lungs are otherwise clear. Upper abdomen: The hepatic density is diffusely decreased consistent with steatosis. No focal hepatic abnormalities are seen. The adrenal glands appear normal. Musculoskeletal/Chest  wall: No chest wall lesion or acute osseous findings. Review of the MIP images confirms the above findings. IMPRESSION: 1. No evidence of acute pulmonary embolism or other acute chest process. 2. 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 Less likely considerations include germ-cell tumor. Thoracic surgical consultation recommended. 3. Coronary artery atherosclerosis. 4. Hepatic steatosis. 5. These results will be called to the ordering clinician or representative by the Radiologist Assistant, and communication documented in the PACS or zVision Dashboard. Electronically Signed   By: Richardean Sale M.D.   On: 05/07/2015 15:32   Nm Pet Image Initial (pi) Skull Base To Thigh  05/21/2015  CLINICAL DATA:  Initial treatment strategy for calcified anterior mediastinal mass detected on recent chest CT angiogram performed in the setting of shortness breath. EXAM: NUCLEAR MEDICINE PET SKULL BASE TO THIGH TECHNIQUE: 12.2 mCi F-18 FDG was injected intravenously. Full-ring PET imaging was performed from the skull base to thigh after the radiotracer. CT data was obtained and used for attenuation correction and anatomic localization. FASTING BLOOD  GLUCOSE:  Value: 125 mg/dl COMPARISON:  05/07/2015 chest CT angiogram. FINDINGS: NECK No hypermetabolic lymph nodes in the neck. CHEST Partially calcified 4.6 x 2.2 cm irregular mass in the anterior mediastinum (series 3/image 91) is non hypermetabolic with max SUV 2.3 (compared to mediastinal blood pool activity with max SUV 3.0). There are nonenlarged hypermetabolic right paratracheal nodes, largest 0.7 cm (series 3/image 83) with max SUV 5.7. There is a hypermetabolic nonenlarged 0.8 cm right infrahilar node (series 3/image 96) with max SUV 8.0. There is a hypermetabolic nonenlarged 0.7 cm left hilar node (series 3/image 95) with max SUV 5.7. No hypermetabolic axillary nodes. Extensive coronary atherosclerosis. Stable 3 mm anterior right lower lobe pulmonary nodule (series 3/image 103), below PET resolution. No acute consolidative airspace disease or additional significant pulmonary nodules. No pleural effusions. ABDOMEN/PELVIS No abnormal hypermetabolic activity within the liver, pancreas, adrenal glands, or spleen. No hypermetabolic lymph nodes in the abdomen or pelvis. Mild-to-moderate diffuse hepatic steatosis. Mild prostatomegaly. Small fat containing bilateral inguinal hernias. SKELETON No focal hypermetabolic activity to suggest skeletal metastasis. IMPRESSION: 1. Non hypermetabolic partially calcified 4.6 cm irregular anterior mediastinal mass. 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 2. Hypermetabolic subcentimeter right paratracheal and bilateral hilar lymph nodes, nonspecific, metastatic disease cannot be excluded. 3. Solitary 3 mm right lower lobe pulmonary nodule, below PET resolution. 4. No hypermetabolic extrathoracic disease. 5. Additional findings include coronary atherosclerosis, diffuse hepatic steatosis, mild prostatomegaly and small bilateral fat containing inguinal hernias. Electronically Signed   By: Ilona Sorrel M.D.   On: 05/21/2015 12:32     I have  independently reviewed the above radiologic studies.  Recent Lab Findings: Lab Results  Component Value Date   WBC 5.1 04/10/2013   HGB 11.0 (L) 04/10/2013   HCT 30.9 (L) 04/10/2013   PLT 146 (L) 04/10/2013   GLUCOSE 105 (H) 03/02/2014   ALT 24 04/10/2013   AST 28 04/10/2013   NA 140 03/02/2014   K 4.1 03/02/2014   CL 105 03/02/2014   CREATININE 0.92 03/02/2014   BUN 11 03/02/2014   CO2 30 03/02/2014   INR 0.9 03/22/2013   Stress Test  Done [facility] outpatient : 06/20/2015  Normal treadmill EKG without evidence of ischemia or arrhythmia Normal myocardial perfusion without evidence of myocardial ischemia  although there is some artifact  [facility]  [address] [place], [place]  [address] [phone]  Procedure: Exercise Myocardial Perfusion Imaging   ONE day procedure Indication: Coronary artery disease of native artery of native heart with  stable angina pectoris (CMS-HCC) - Plan: NM myocardial perfusion SPECT  multiple (stress and rest), ECG stress test only Stable angina pectoris (CMS-HCC) - Plan: NM myocardial perfusion SPECT  multiple (stress and rest), ECG stress test only Ordering Physician:  Dr. Serafina Royals Clinical History: 70 y.o. year old male Vitals: Height: 65 in  Weight: 216 lb Cardiac risk factors include:   VHD, Hyperlipidemia, PCI, HTN and CAD  Procedure: The patient performed treadmill exercise using a Bruce protocol for 9:01  minutes. The exercise test was stopped due to fatigue.  Blood pressure  response was normal.  Rest HR: 67bpm Rest BP: 134/45mmHg Max HR: 121bpm Max BP: 164/37mmHg Mets:     10.4 % MAX HR:   80% Stress Test Administered by: Oswald Hillock, CMA ECG Interpretation: Rest ECG:  normal sinus rhythm, none Stress ECG:  normal sinus rhythm,   Recovery ECG:  normal sinus rhythm ECG Interpretation:  negative, no ECG  changes. Administrations This Visit    technetium Tc70m sestamibi (CARDIOLITE) injection 99991111 millicurie    Admin Date Action Dose Route Administered By   [id number] Given [id number] millicurie Intravenous Ane Payment, CNMT        technetium Tc71m sestamibi (CARDIOLITE) injection [id number] millicurie    Admin Date Action Dose Route Administered By  [id number] Given [id number] millicurie Intravenous Scott N Goard, CNMT         Gated post-stress perfusion imaging was performed 30 minutes after stress.  Rest images were performed 30 minutes after injection.  Gated LV Analysis:  TID:  1.1  LVEF= 52%  FINDINGS: Regional wall motion:  reveals normal myocardial thickening and wall  motion. The overall quality of the study is fair.   Artifacts noted: yes Left ventricular cavity: normal.  Perfusion Analysis:  SPECT images demonstrate homogeneous tracer  distribution throughout the myocardium.        Assessment / Plan:   #1  Non hypermetabolic partially calcified 4.6 cm irregular anterior mediastinal mass. 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 #2  Hypermetabolic subcentimeter right paratracheal and bilateral hilar lymph nodes, nonspecific, #3 known coronary occlusive disease #4 disability from previous stroke 1999  I discussed with the patient and his wife proceeding with resection of the thymic tumor through partial upper sternotomy .  Patient will discuss with primary care doctor imaging to rule out disc disease prior to surgery- if his symptoms have not improved this may delay his surgery.    proceed with surgical resection on August 28, if symptoms of his "crick in the neck" have resolved.  [name] MD      [address] [place] [address] Office [phone]   Beeper [phone]  10/17/2015 6:01 PM

## 2015-10-23 ENCOUNTER — Encounter (HOSPITAL_COMMUNITY): Payer: Self-pay

## 2015-10-23 ENCOUNTER — Encounter (HOSPITAL_COMMUNITY)
Admission: RE | Admit: 2015-10-23 | Discharge: 2015-10-23 | Disposition: A | Discharge status: Home / Self Care | Payer: Medicare Other | Source: Ambulatory Visit | Attending: Cardiothoracic Surgery | Admitting: Cardiothoracic Surgery

## 2015-10-23 ENCOUNTER — Ambulatory Visit (HOSPITAL_COMMUNITY): Admission: RE | Admit: 2015-10-23 | Payer: Medicare Other | Source: Ambulatory Visit

## 2015-10-23 ENCOUNTER — Other Ambulatory Visit: Payer: Self-pay

## 2015-10-23 DIAGNOSIS — Z01818 Encounter for other preprocedural examination: Secondary | ICD-10-CM | POA: Insufficient documentation

## 2015-10-23 DIAGNOSIS — Z7951 Long term (current) use of inhaled steroids: Secondary | ICD-10-CM | POA: Diagnosis not present

## 2015-10-23 DIAGNOSIS — I2510 Atherosclerotic heart disease of native coronary artery without angina pectoris: Secondary | ICD-10-CM | POA: Insufficient documentation

## 2015-10-23 DIAGNOSIS — Z0183 Encounter for blood typing: Secondary | ICD-10-CM | POA: Insufficient documentation

## 2015-10-23 DIAGNOSIS — Z01812 Encounter for preprocedural laboratory examination: Secondary | ICD-10-CM | POA: Insufficient documentation

## 2015-10-23 DIAGNOSIS — E785 Hyperlipidemia, unspecified: Secondary | ICD-10-CM | POA: Insufficient documentation

## 2015-10-23 DIAGNOSIS — Z79899 Other long term (current) drug therapy: Secondary | ICD-10-CM | POA: Insufficient documentation

## 2015-10-23 DIAGNOSIS — Z955 Presence of coronary angioplasty implant and graft: Secondary | ICD-10-CM | POA: Diagnosis not present

## 2015-10-23 DIAGNOSIS — D4989 Neoplasm of unspecified behavior of other specified sites: Secondary | ICD-10-CM | POA: Diagnosis not present

## 2015-10-23 DIAGNOSIS — I10 Essential (primary) hypertension: Secondary | ICD-10-CM | POA: Insufficient documentation

## 2015-10-23 DIAGNOSIS — J45909 Unspecified asthma, uncomplicated: Secondary | ICD-10-CM | POA: Diagnosis not present

## 2015-10-23 DIAGNOSIS — Z7982 Long term (current) use of aspirin: Secondary | ICD-10-CM | POA: Insufficient documentation

## 2015-10-23 HISTORY — DX: Other complications of anesthesia, initial encounter: T88.59XA

## 2015-10-23 HISTORY — DX: Personal history of other diseases of the respiratory system: Z87.09

## 2015-10-23 HISTORY — DX: Anesthesia of skin: R20.0

## 2015-10-23 HISTORY — DX: Gastro-esophageal reflux disease without esophagitis: K21.9

## 2015-10-23 HISTORY — DX: Adverse effect of unspecified anesthetic, initial encounter: T41.45XA

## 2015-10-23 HISTORY — DX: Dorsalgia, unspecified: M54.9

## 2015-10-23 HISTORY — DX: Pain in unspecified joint: M25.50

## 2015-10-23 HISTORY — DX: Other muscle spasm: M62.838

## 2015-10-23 HISTORY — DX: Personal history of other diseases of the digestive system: Z87.19

## 2015-10-23 HISTORY — DX: Other chronic pain: G89.29

## 2015-10-23 HISTORY — DX: Insomnia, unspecified: G47.00

## 2015-10-23 HISTORY — DX: Essential tremor: G25.0

## 2015-10-23 HISTORY — DX: Unspecified cataract: H26.9

## 2015-10-23 LAB — COMPREHENSIVE METABOLIC PANEL
ALT: 33 U/L (ref 17–63)
AST: 30 U/L (ref 15–41)
Albumin: 4.3 g/dL (ref 3.5–5.0)
Alkaline Phosphatase: 55 U/L (ref 38–126)
Anion gap: 12 (ref 5–15)
BUN: 9 mg/dL (ref 6–20)
CO2: 26 mmol/L (ref 22–32)
Calcium: 9.5 mg/dL (ref 8.9–10.3)
Chloride: 92 mmol/L — ABNORMAL LOW (ref 101–111)
Creatinine, Ser: 0.93 mg/dL (ref 0.61–1.24)
GFR calc Af Amer: >60 mL/min (ref >60)
GFR calc non Af Amer: >60 mL/min (ref >60)
Glucose, Bld: 112 mg/dL — ABNORMAL HIGH (ref 65–99)
Potassium: 3.7 mmol/L (ref 3.5–5.1)
Sodium: 130 mmol/L — ABNORMAL LOW (ref 135–145)
Total Bilirubin: 0.7 mg/dL (ref 0.3–1.2)
Total Protein: 6.9 g/dL (ref 6.5–8.1)

## 2015-10-23 LAB — TYPE AND SCREEN
ABO/RH(D): A NEG
Antibody Screen: NEGATIVE

## 2015-10-23 LAB — PROTIME-INR
INR: 0.99
Prothrombin Time: 13.1 seconds (ref 11.4–15.2)

## 2015-10-23 LAB — URINALYSIS, ROUTINE W REFLEX MICROSCOPIC
Bilirubin Urine: NEGATIVE
Glucose, UA: NEGATIVE mg/dL
Hgb urine dipstick: NEGATIVE
Ketones, ur: NEGATIVE mg/dL
Leukocytes, UA: NEGATIVE
Nitrite: NEGATIVE
Protein, ur: NEGATIVE mg/dL
Specific Gravity, Urine: 1.011 (ref 1.005–1.030)
pH: 7.5 (ref 5.0–8.0)

## 2015-10-23 LAB — SURGICAL PCR SCREEN
MRSA, PCR: NEGATIVE
Staphylococcus aureus: POSITIVE — AB

## 2015-10-23 LAB — BLOOD GAS, ARTERIAL
Acid-Base Excess: 4.9 mmol/L — ABNORMAL HIGH (ref 0.0–2.0)
Bicarbonate: 27.8 mEq/L — ABNORMAL HIGH (ref 20.0–24.0)
Drawn by: 42180
FIO2: 21
O2 Saturation: 91.8 %
Patient temperature: 98.6
TCO2: 28.8 mmol/L (ref 0–100)
pCO2 arterial: 33.2 mmHg — ABNORMAL LOW (ref 35.0–45.0)
pH, Arterial: 7.533 — ABNORMAL HIGH (ref 7.350–7.450)
pO2, Arterial: 56.2 mmHg — ABNORMAL LOW (ref 80.0–100.0)

## 2015-10-23 LAB — ABO/RH: ABO/RH(D): A NEG

## 2015-10-23 LAB — APTT: aPTT: 32 seconds (ref 24–36)

## 2015-10-23 NOTE — Progress Notes (Addendum)
Cardiologist is Dr.Kowalski-last visit under care everywhere from 06-20-15  Medical Md is Dr.Jeffrey SParks  Echo and stress test reports in epic from 09-13-15  Heart cath report in epic from 2015  EKG denies in past month  Requesting Anesthesia Records from [facility] in [place] from 2014

## 2015-10-23 NOTE — Progress Notes (Signed)
Lab called stating CBC would have to be redrawn,it clotted.

## 2015-10-23 NOTE — Pre-Procedure Instructions (Signed)
Johnny Mccoy  10/23/2015      [id number] [place], [place] [address] Phone: [phone] Fax: [place], [place] [facility] [address] [place] [address] [place] [address] Phone: [phone] Fax: [phone]    Your procedure is scheduled on Mon, Aug 28 @ 7:30 AM  Report to [facility] Admitting at 5:30 AM  Call this number if you have problems the morning of surgery:  [phone]   Remember:  Do not eat food or drink liquids after midnight.  Take these medicines the morning of surgery with A SIP OF WATER Albuterol<Bring Your Inhaler With You>,Amlodipine(Norvasc),Zyrtec (Cetirizine),Dexilant(Dexlansoprazole),Pain Pill (if needed),Lamictal(Lamotrigine),Lorazepam (Ativan),Metoprolol(Toprol),Namenda,and Pristiq                Stop taking your Aspirin along with any Vitamins or Herbal Medications. No Goody's,BC's,Aleve,Advil,Motrin,Fish Oil,or any Herbal Medications.    Do not wear jewelry.  Do not wear lotions, powders,deoderant, or colognesMen may shave face and neck.  Do not bring valuables to the hospital.  [facility] is not responsible for any belongings or valuables.  Contacts, dentures or bridgework may not be worn into surgery.  Leave your suitcase in the car.  After surgery it may be brought to your room.  For patients admitted to the hospital, discharge time will be determined by your treatment team.  Patients discharged the day of surgery will not be allowed to drive home.    [facility]  Before surgery, you can play an important role.  Because skin is not sterile, your skin needs to be as free of germs as possible.  You can reduce the number of germs on you skin by washing with CHG (chlorahexidine gluconate) soap before surgery.  CHG is an antiseptic cleaner which kills germs and bonds with the skin to  continue killing germs even after washing.  Please DO NOT use if you have an allergy to CHG or antibacterial soaps.  If your skin becomes reddened/irritated stop using the CHG and inform your nurse when you arrive at Short Stay.  Do not shave (including legs and underarms) for at least 48 hours prior to the first CHG shower.  You may shave your face.  Please follow these instructions carefully:   1.  Shower with CHG Soap the night before surgery and the                                morning of Surgery.  2.  If you choose to wash your hair, wash your hair first as usual with your       normal shampoo.  3.  After you shampoo, rinse your hair and body thoroughly to remove the                      Shampoo.  4.  Use CHG as you would any other liquid soap.  You can apply chg directly       to the skin and wash gently with scrungie or a clean washcloth.  5.  Apply the CHG Soap to your body ONLY FROM THE NECK DOWN.        Do not use on open wounds or open sores.  Avoid contact with your eyes,       ears, mouth and  genitals (private parts).  Wash genitals (private parts)       with your normal soap.  6.  Wash thoroughly, paying special attention to the area where your surgery        will be performed.  7.  Thoroughly rinse your body with warm water from the neck down.  8.  DO NOT shower/wash with your normal soap after using and rinsing off       the CHG Soap.  9.  Pat yourself dry with a clean towel.            10.  Wear clean pajamas.            11.  Place clean sheets on your bed the night of your first shower and do not        sleep with pets.  Day of Surgery  Do not apply any lotions/deoderants the morning of surgery.  Please wear clean clothes to the hospital/surgery center.    Please read over the following fact sheets that you were given. MRSA Information and Surgical Site Infection Prevention

## 2015-10-24 ENCOUNTER — Other Ambulatory Visit (HOSPITAL_COMMUNITY): Payer: Medicare Other

## 2015-10-24 NOTE — Progress Notes (Signed)
Anesthesia Chart Review:  Pt is a 70 year old male scheduled for resection of mediastinal tumor, partial sternotomy on 10/28/2015 with Lanelle Bal, MD.   - Cardiologist is Serafina Royals, MD (at [facility], notes in care everywhere) who has cleared pt for surgery in 06/20/15 note. - PCP is Fulton Reek, MD (care everywhere)  PMH includes:  CAD (s/p DES to RCA 2015; s/p DES mid RCA 2004, prior BMS to LCx), HTN, hyperlipidemia, asthma, hepatitis B, seizures (none in years). Never smoker. BMI 34  Anesthesia history: Pt had R unicompartmental knee under spinal anesthesia at [facility] in [place], [place] 05/26/12. Anesthesia record on paper chart for review.  Pt received propofol, versed and fentanyl intraop, and phenylephrine/epinephrine for hypotension. Per notes in care everywhere from [place] dated 05/26/12, while in PACU, pt had seizure-like activity and was confused.  Pt received ativan and demerol in PACU before transfer to [facility] for further treatment. On initial encounter at [facility], pt was alert and oriented. During neuro exam experienced several stuttering episodes of tremors which occurred to varying degrees in all extremities, and tended to slow in amplitude or stop with distraction or following commands. Consciousness was preserved at all times, and pt had not post-ictal period or confusion. Work up included EEG during periods of movement and no electrographic correlate was found. Over 2 days pt's tremulousness decreased and eventually stopped. D/c with diagnosis of tremulousness. (Note pt also received propofol, versed and fentanyl with spinal anesthesia during R TKA 04/05/13 at [facility], anesthesia record on paper chart; pt tolerated anesthesia/surgery without issue).  Other anesthesia history includes experiencing retro and anterograde amnesia after what sounds like a cardiac cath procedure in 1999.   Medications include: albuterol, amlodipine, ASA, lipitor, breo ellipta, dexilant, hctz,  lamotrigine, metoprolol, namenda, zantac  Preoperative labs reviewed.  CBC clotted. Will be obtained DOS  EKG 10/23/15: NSR. LAD. RBBB.   Nuclear stress test 06/20/15 (care everywhere):  - Normal treadmill EKG without evidence of ischemia or arrhythmia - Normal myocardial perfusion without evidence of myocardial ischemia although there is some artifact  Echo 06/20/15 (care everywhere):  1. Normal LV systolic function. EF 55% 2. Normal RV systolic function 3. Mild to moderate tricuspid and mitral insufficiency 4. Trace aortic valve insufficiency 5. Mild RV enlargement 6. Mild biatrial enlargement 7. Sclerotic aortic valve without stenosis  If no changes, I anticipate pt can proceed with surgery as scheduled.   Willeen Cass, FNP-BC [facility] Short Stay Surgical Center/Anesthesiology Phone: [phone] 10/24/2015 4:58 PM

## 2015-10-25 MED ORDER — CEFUROXIME SODIUM 1.5 G IJ SOLR
1.5000 g | INTRAMUSCULAR | Status: AC
  Administered 2015-10-28: 1.5 g via INTRAVENOUS
  Filled 2015-10-25: qty 1.5

## 2015-10-28 ENCOUNTER — Inpatient Hospital Stay (HOSPITAL_COMMUNITY): Payer: Medicare Other

## 2015-10-28 ENCOUNTER — Encounter (HOSPITAL_COMMUNITY)
Admission: RE | Disposition: A | Discharge status: Home / Self Care | Payer: Self-pay | Source: Ambulatory Visit | Attending: Cardiothoracic Surgery

## 2015-10-28 ENCOUNTER — Encounter (HOSPITAL_COMMUNITY): Payer: Self-pay | Admitting: Certified Registered"

## 2015-10-28 ENCOUNTER — Inpatient Hospital Stay (HOSPITAL_COMMUNITY): Payer: Medicare Other | Admitting: Certified Registered Nurse Anesthetist

## 2015-10-28 ENCOUNTER — Inpatient Hospital Stay (HOSPITAL_COMMUNITY)
Admission: RE | Admit: 2015-10-28 | Discharge: 2015-10-31 | DRG: 827 | Disposition: A | Discharge status: Home / Self Care | Payer: Medicare Other | Source: Ambulatory Visit | Attending: Cardiothoracic Surgery | Admitting: Cardiothoracic Surgery

## 2015-10-28 DIAGNOSIS — G40409 Other generalized epilepsy and epileptic syndromes, not intractable, without status epilepticus: Secondary | ICD-10-CM | POA: Diagnosis present

## 2015-10-28 DIAGNOSIS — Z09 Encounter for follow-up examination after completed treatment for conditions other than malignant neoplasm: Secondary | ICD-10-CM

## 2015-10-28 DIAGNOSIS — Z9689 Presence of other specified functional implants: Secondary | ICD-10-CM

## 2015-10-28 DIAGNOSIS — I454 Nonspecific intraventricular block: Secondary | ICD-10-CM | POA: Diagnosis present

## 2015-10-28 DIAGNOSIS — G4700 Insomnia, unspecified: Secondary | ICD-10-CM | POA: Diagnosis present

## 2015-10-28 DIAGNOSIS — Z955 Presence of coronary angioplasty implant and graft: Secondary | ICD-10-CM

## 2015-10-28 DIAGNOSIS — G250 Essential tremor: Secondary | ICD-10-CM | POA: Diagnosis present

## 2015-10-28 DIAGNOSIS — J45909 Unspecified asthma, uncomplicated: Secondary | ICD-10-CM | POA: Diagnosis present

## 2015-10-28 DIAGNOSIS — I2510 Atherosclerotic heart disease of native coronary artery without angina pectoris: Secondary | ICD-10-CM | POA: Diagnosis present

## 2015-10-28 DIAGNOSIS — Z8673 Personal history of transient ischemic attack (TIA), and cerebral infarction without residual deficits: Secondary | ICD-10-CM | POA: Diagnosis not present

## 2015-10-28 DIAGNOSIS — D696 Thrombocytopenia, unspecified: Secondary | ICD-10-CM | POA: Diagnosis present

## 2015-10-28 DIAGNOSIS — F419 Anxiety disorder, unspecified: Secondary | ICD-10-CM | POA: Diagnosis present

## 2015-10-28 DIAGNOSIS — E785 Hyperlipidemia, unspecified: Secondary | ICD-10-CM | POA: Diagnosis present

## 2015-10-28 DIAGNOSIS — I10 Essential (primary) hypertension: Secondary | ICD-10-CM | POA: Diagnosis present

## 2015-10-28 DIAGNOSIS — E871 Hypo-osmolality and hyponatremia: Secondary | ICD-10-CM | POA: Diagnosis present

## 2015-10-28 DIAGNOSIS — K219 Gastro-esophageal reflux disease without esophagitis: Secondary | ICD-10-CM | POA: Diagnosis present

## 2015-10-28 DIAGNOSIS — Z01818 Encounter for other preprocedural examination: Secondary | ICD-10-CM

## 2015-10-28 DIAGNOSIS — F329 Major depressive disorder, single episode, unspecified: Secondary | ICD-10-CM | POA: Diagnosis present

## 2015-10-28 DIAGNOSIS — R222 Localized swelling, mass and lump, trunk: Secondary | ICD-10-CM | POA: Diagnosis not present

## 2015-10-28 DIAGNOSIS — J9859 Other diseases of mediastinum, not elsewhere classified: Secondary | ICD-10-CM | POA: Diagnosis present

## 2015-10-28 DIAGNOSIS — C37 Malignant neoplasm of thymus: Principal | ICD-10-CM | POA: Diagnosis present

## 2015-10-28 DIAGNOSIS — D62 Acute posthemorrhagic anemia: Secondary | ICD-10-CM | POA: Diagnosis not present

## 2015-10-28 DIAGNOSIS — D4989 Neoplasm of unspecified behavior of other specified sites: Secondary | ICD-10-CM

## 2015-10-28 HISTORY — PX: MEDIASTERNOTOMY: SHX5084

## 2015-10-28 HISTORY — PX: RESECTION OF MEDIASTINAL MASS: SHX6497

## 2015-10-28 LAB — CBC
HCT: 40.3 % (ref 39.0–52.0)
Hemoglobin: 14.3 g/dL (ref 13.0–17.0)
MCH: 32.3 pg (ref 26.0–34.0)
MCHC: 35.5 g/dL (ref 30.0–36.0)
MCV: 91 fL (ref 78.0–100.0)
Platelets: 106 10*3/uL — ABNORMAL LOW (ref 150–400)
RBC: 4.43 MIL/uL (ref 4.22–5.81)
RDW: 12.6 % (ref 11.5–15.5)
WBC: 4.7 10*3/uL (ref 4.0–10.5)

## 2015-10-28 LAB — GLUCOSE, CAPILLARY: Glucose-Capillary: 142 mg/dL — ABNORMAL HIGH (ref 65–99)

## 2015-10-28 SURGERY — EXCISION, MASS, MEDIASTINUM
Anesthesia: General

## 2015-10-28 MED ORDER — ROCURONIUM BROMIDE 10 MG/ML (PF) SYRINGE
PREFILLED_SYRINGE | INTRAVENOUS | Status: AC
  Filled 2015-10-28: qty 10

## 2015-10-28 MED ORDER — CHLORHEXIDINE GLUCONATE CLOTH 2 % EX PADS
6.0000 | MEDICATED_PAD | Freq: Every day | CUTANEOUS | Status: DC
  Administered 2015-10-28: 6 via TOPICAL

## 2015-10-28 MED ORDER — BISACODYL 5 MG PO TBEC
10.0000 mg | DELAYED_RELEASE_TABLET | Freq: Every day | ORAL | Status: DC
  Administered 2015-10-29 – 2015-10-31 (×3): 10 mg via ORAL
  Filled 2015-10-28 (×3): qty 2

## 2015-10-28 MED ORDER — LAMOTRIGINE 100 MG PO TABS
100.0000 mg | ORAL_TABLET | Freq: Every day | ORAL | Status: DC
  Administered 2015-10-29 – 2015-10-31 (×3): 100 mg via ORAL
  Filled 2015-10-28 (×3): qty 1

## 2015-10-28 MED ORDER — PROPOFOL 10 MG/ML IV BOLUS
INTRAVENOUS | Status: DC | PRN
  Administered 2015-10-28: 120 mg via INTRAVENOUS

## 2015-10-28 MED ORDER — SUGAMMADEX SODIUM 200 MG/2ML IV SOLN
INTRAVENOUS | Status: AC
  Filled 2015-10-28: qty 4

## 2015-10-28 MED ORDER — NALOXONE HCL 0.4 MG/ML IJ SOLN: 0.4000 mg | INTRAMUSCULAR | Status: DC | PRN

## 2015-10-28 MED ORDER — ONDANSETRON HCL 4 MG/2ML IJ SOLN: 4.0000 mg | Freq: Four times a day (QID) | INTRAMUSCULAR | Status: DC | PRN

## 2015-10-28 MED ORDER — ROCURONIUM BROMIDE 100 MG/10ML IV SOLN
INTRAVENOUS | Status: DC | PRN
  Administered 2015-10-28: 50 mg via INTRAVENOUS
  Administered 2015-10-28: 10 mg via INTRAVENOUS
  Administered 2015-10-28: 20 mg via INTRAVENOUS

## 2015-10-28 MED ORDER — FENTANYL CITRATE (PF) 250 MCG/5ML IJ SOLN
INTRAMUSCULAR | Status: AC
  Filled 2015-10-28: qty 5

## 2015-10-28 MED ORDER — DIPHENHYDRAMINE HCL 12.5 MG/5ML PO ELIX: 12.5000 mg | ORAL_SOLUTION | Freq: Four times a day (QID) | ORAL | Status: DC | PRN

## 2015-10-28 MED ORDER — MIDAZOLAM HCL 2 MG/2ML IJ SOLN
INTRAMUSCULAR | Status: DC | PRN
  Administered 2015-10-28 (×2): 1 mg via INTRAVENOUS

## 2015-10-28 MED ORDER — FLUTICASONE FUROATE-VILANTEROL 100-25 MCG/INH IN AEPB
1.0000 | INHALATION_SPRAY | Freq: Every day | RESPIRATORY_TRACT | Status: DC
  Administered 2015-10-29 – 2015-10-30 (×2): 1 via RESPIRATORY_TRACT
  Filled 2015-10-28: qty 28

## 2015-10-28 MED ORDER — HYDROCHLOROTHIAZIDE 25 MG PO TABS
25.0000 mg | ORAL_TABLET | Freq: Every day | ORAL | Status: DC
  Administered 2015-10-29 – 2015-10-31 (×3): 25 mg via ORAL
  Filled 2015-10-28 (×3): qty 1

## 2015-10-28 MED ORDER — PHENYLEPHRINE HCL 10 MG/ML IJ SOLN
INTRAMUSCULAR | Status: DC | PRN
  Administered 2015-10-28: 20 ug/min via INTRAVENOUS

## 2015-10-28 MED ORDER — TRAZODONE HCL 100 MG PO TABS
100.0000 mg | ORAL_TABLET | Freq: Every evening | ORAL | Status: DC | PRN
  Administered 2015-10-29 – 2015-10-30 (×2): 100 mg via ORAL
  Filled 2015-10-28 (×3): qty 1

## 2015-10-28 MED ORDER — SODIUM CHLORIDE 0.9% FLUSH: 9.0000 mL | INTRAVENOUS | Status: DC | PRN

## 2015-10-28 MED ORDER — ONDANSETRON HCL 4 MG/2ML IJ SOLN
INTRAMUSCULAR | Status: DC | PRN
  Administered 2015-10-28: 4 mg via INTRAVENOUS

## 2015-10-28 MED ORDER — MIDAZOLAM HCL 2 MG/2ML IJ SOLN
INTRAMUSCULAR | Status: AC
  Filled 2015-10-28: qty 2

## 2015-10-28 MED ORDER — HYDROMORPHONE HCL 1 MG/ML IJ SOLN
0.2500 mg | INTRAMUSCULAR | Status: DC | PRN
  Administered 2015-10-28 (×2): 0.5 mg via INTRAVENOUS

## 2015-10-28 MED ORDER — DEXTROSE 5 % IV SOLN
1.5000 g | Freq: Two times a day (BID) | INTRAVENOUS | Status: AC
  Administered 2015-10-28 – 2015-10-29 (×2): 1.5 g via INTRAVENOUS
  Filled 2015-10-28 (×2): qty 1.5

## 2015-10-28 MED ORDER — MEPERIDINE HCL 25 MG/ML IJ SOLN: 6.2500 mg | INTRAMUSCULAR | Status: DC | PRN

## 2015-10-28 MED ORDER — LACTATED RINGERS IV SOLN
INTRAVENOUS | Status: DC | PRN
  Administered 2015-10-28 (×3): via INTRAVENOUS

## 2015-10-28 MED ORDER — VENLAFAXINE HCL ER 150 MG PO CP24
150.0000 mg | ORAL_CAPSULE | Freq: Every day | ORAL | Status: DC
  Administered 2015-10-29 – 2015-10-31 (×3): 150 mg via ORAL
  Filled 2015-10-28 (×3): qty 1

## 2015-10-28 MED ORDER — DIPHENHYDRAMINE HCL 50 MG/ML IJ SOLN: 12.5000 mg | Freq: Four times a day (QID) | INTRAMUSCULAR | Status: DC | PRN

## 2015-10-28 MED ORDER — HYDROMORPHONE HCL 1 MG/ML IJ SOLN
INTRAMUSCULAR | Status: AC
  Filled 2015-10-28: qty 1

## 2015-10-28 MED ORDER — FENTANYL CITRATE (PF) 100 MCG/2ML IJ SOLN
INTRAMUSCULAR | Status: DC | PRN
  Administered 2015-10-28 (×2): 50 ug via INTRAVENOUS
  Administered 2015-10-28 (×2): 100 ug via INTRAVENOUS
  Administered 2015-10-28: 50 ug via INTRAVENOUS
  Administered 2015-10-28: 75 ug via INTRAVENOUS
  Administered 2015-10-28: 125 ug via INTRAVENOUS

## 2015-10-28 MED ORDER — HEMOSTATIC AGENTS (NO CHARGE) OPTIME
TOPICAL | Status: DC | PRN
  Administered 2015-10-28: 1 via TOPICAL

## 2015-10-28 MED ORDER — METOPROLOL SUCCINATE ER 50 MG PO TB24
50.0000 mg | ORAL_TABLET | Freq: Every day | ORAL | Status: DC
  Administered 2015-10-29 – 2015-10-31 (×3): 50 mg via ORAL
  Filled 2015-10-28 (×3): qty 1

## 2015-10-28 MED ORDER — FENTANYL 40 MCG/ML IV SOLN
INTRAVENOUS | Status: AC
  Filled 2015-10-28: qty 25

## 2015-10-28 MED ORDER — ASPIRIN EC 81 MG PO TBEC
81.0000 mg | DELAYED_RELEASE_TABLET | Freq: Every day | ORAL | Status: DC
  Administered 2015-10-29 – 2015-10-31 (×3): 81 mg via ORAL
  Filled 2015-10-28 (×3): qty 1

## 2015-10-28 MED ORDER — FENTANYL 40 MCG/ML IV SOLN
INTRAVENOUS | Status: DC
  Administered 2015-10-28: 200 ug via INTRAVENOUS
  Administered 2015-10-28: 11:00:00 via INTRAVENOUS
  Administered 2015-10-28: 180 ug via INTRAVENOUS
  Administered 2015-10-29: 190 ug via INTRAVENOUS
  Administered 2015-10-29: 170 ug via INTRAVENOUS
  Administered 2015-10-29: 120 ug via INTRAVENOUS
  Administered 2015-10-29: 400 ug via INTRAVENOUS
  Administered 2015-10-30: 05:00:00 via INTRAVENOUS
  Administered 2015-10-30: 80 ug via INTRAVENOUS
  Administered 2015-10-30: 170 ug via INTRAVENOUS
  Administered 2015-10-30: 40 ug via INTRAVENOUS
  Filled 2015-10-28 (×2): qty 25

## 2015-10-28 MED ORDER — DEXTROSE-NACL 5-0.9 % IV SOLN
INTRAVENOUS | Status: DC
  Administered 2015-10-28 – 2015-10-29 (×2): via INTRAVENOUS

## 2015-10-28 MED ORDER — POTASSIUM CHLORIDE 10 MEQ/50ML IV SOLN
10.0000 meq | Freq: Every day | INTRAVENOUS | Status: DC | PRN
  Administered 2015-10-29 (×3): 10 meq via INTRAVENOUS
  Filled 2015-10-28: qty 50

## 2015-10-28 MED ORDER — ACETAMINOPHEN 160 MG/5ML PO SOLN: 1000.0000 mg | Freq: Four times a day (QID) | ORAL | Status: DC

## 2015-10-28 MED ORDER — ALBUTEROL SULFATE (2.5 MG/3ML) 0.083% IN NEBU: 3.0000 mL | INHALATION_SOLUTION | Freq: Four times a day (QID) | RESPIRATORY_TRACT | Status: DC | PRN

## 2015-10-28 MED ORDER — PROPOFOL 10 MG/ML IV BOLUS
INTRAVENOUS | Status: AC
  Filled 2015-10-28: qty 20

## 2015-10-28 MED ORDER — SENNOSIDES-DOCUSATE SODIUM 8.6-50 MG PO TABS
1.0000 | ORAL_TABLET | Freq: Every day | ORAL | Status: DC
  Administered 2015-10-28 – 2015-10-30 (×3): 1 via ORAL
  Filled 2015-10-28 (×3): qty 1

## 2015-10-28 MED ORDER — ORAL CARE MOUTH RINSE
15.0000 mL | Freq: Two times a day (BID) | OROMUCOSAL | Status: DC
  Administered 2015-10-28 – 2015-10-31 (×6): 15 mL via OROMUCOSAL

## 2015-10-28 MED ORDER — OXYCODONE HCL 5 MG PO TABS
5.0000 mg | ORAL_TABLET | ORAL | Status: DC | PRN
  Administered 2015-10-28: 5 mg via ORAL
  Administered 2015-10-29 – 2015-10-31 (×5): 10 mg via ORAL
  Filled 2015-10-28 (×2): qty 2
  Filled 2015-10-28: qty 1
  Filled 2015-10-28 (×3): qty 2

## 2015-10-28 MED ORDER — AMLODIPINE BESYLATE 5 MG PO TABS
5.0000 mg | ORAL_TABLET | Freq: Every day | ORAL | Status: DC
  Administered 2015-10-29 – 2015-10-31 (×3): 5 mg via ORAL
  Filled 2015-10-28 (×3): qty 1

## 2015-10-28 MED ORDER — NITROGLYCERIN 0.4 MG/SPRAY TL SOLN
2.0000 | Status: DC | PRN
  Filled 2015-10-28: qty 4.9

## 2015-10-28 MED ORDER — LORATADINE 10 MG PO TABS
10.0000 mg | ORAL_TABLET | Freq: Every day | ORAL | Status: DC
  Administered 2015-10-29 – 2015-10-31 (×3): 10 mg via ORAL
  Filled 2015-10-28 (×3): qty 1

## 2015-10-28 MED ORDER — TRAMADOL HCL 50 MG PO TABS: 50.0000 mg | ORAL_TABLET | Freq: Four times a day (QID) | ORAL | Status: DC | PRN

## 2015-10-28 MED ORDER — MEMANTINE HCL ER 28 MG PO CP24
28.0000 mg | ORAL_CAPSULE | Freq: Every day | ORAL | Status: DC
  Administered 2015-10-29 – 2015-10-31 (×3): 28 mg via ORAL
  Filled 2015-10-28 (×3): qty 1

## 2015-10-28 MED ORDER — ACETAMINOPHEN 500 MG PO TABS
1000.0000 mg | ORAL_TABLET | Freq: Four times a day (QID) | ORAL | Status: DC
  Administered 2015-10-29 – 2015-10-31 (×8): 1000 mg via ORAL
  Filled 2015-10-28 (×9): qty 2

## 2015-10-28 MED ORDER — MUPIROCIN 2 % EX OINT
1.0000 "application " | TOPICAL_OINTMENT | Freq: Two times a day (BID) | CUTANEOUS | Status: DC
  Administered 2015-10-28: 1 via NASAL
  Filled 2015-10-28: qty 22

## 2015-10-28 MED ORDER — FAMOTIDINE 20 MG PO TABS
40.0000 mg | ORAL_TABLET | Freq: Every day | ORAL | Status: DC
  Administered 2015-10-29 – 2015-10-30 (×2): 40 mg via ORAL
  Filled 2015-10-28 (×2): qty 2

## 2015-10-28 MED ORDER — ATORVASTATIN CALCIUM 80 MG PO TABS
80.0000 mg | ORAL_TABLET | Freq: Every day | ORAL | Status: DC
  Administered 2015-10-29 – 2015-10-30 (×2): 80 mg via ORAL
  Filled 2015-10-28 (×2): qty 1

## 2015-10-28 MED ORDER — SUGAMMADEX SODIUM 200 MG/2ML IV SOLN
INTRAVENOUS | Status: DC | PRN
  Administered 2015-10-28: 200 mg via INTRAVENOUS
  Administered 2015-10-28: 100 mg via INTRAVENOUS

## 2015-10-28 MED ORDER — BUDESONIDE 0.5 MG/2ML IN SUSP
0.5000 mg | Freq: Two times a day (BID) | RESPIRATORY_TRACT | Status: DC
Start: 2015-10-28 — End: 2015-10-28

## 2015-10-28 MED ORDER — ONDANSETRON HCL 4 MG/2ML IJ SOLN: 4.0000 mg | Freq: Once | INTRAMUSCULAR | Status: DC | PRN

## 2015-10-28 SURGICAL SUPPLY — 53 items
BAG URIMETER BARDEX IC 350 (UROLOGICAL SUPPLIES) ×2 IMPLANT
BLADE STERNUM SYSTEM 6 (BLADE) ×2 IMPLANT
BLADE SURG 11 STRL SS (BLADE) IMPLANT
CANISTER SUCTION 2500CC (MISCELLANEOUS) ×2 IMPLANT
CATH FOLEY 2WAY SLVR  5CC 16FR (CATHETERS)
CATH FOLEY 2WAY SLVR 5CC 16FR (CATHETERS) IMPLANT
CATH THORACIC 28FR (CATHETERS) IMPLANT
CATH THORACIC 28FR RT ANG (CATHETERS) IMPLANT
CLIP TI WIDE RED SMALL 24 (CLIP) IMPLANT
CONN ST 1/4X3/8  BEN (MISCELLANEOUS) ×1
CONN ST 1/4X3/8 BEN (MISCELLANEOUS) ×1 IMPLANT
CONT SPEC 4OZ CLIKSEAL STRL BL (MISCELLANEOUS) ×2 IMPLANT
COVER SURGICAL LIGHT HANDLE (MISCELLANEOUS) ×2 IMPLANT
DERMABOND ADVANCED (GAUZE/BANDAGES/DRESSINGS) ×1
DERMABOND ADVANCED .7 DNX12 (GAUZE/BANDAGES/DRESSINGS) ×1 IMPLANT
DRAPE CARDIOVASCULAR INCISE (DRAPES) ×1
DRAPE LAPAROSCOPIC ABDOMINAL (DRAPES) IMPLANT
DRAPE SRG 135X102X78XABS (DRAPES) ×1 IMPLANT
DRSG AQUACEL AG ADV 3.5X14 (GAUZE/BANDAGES/DRESSINGS) ×2 IMPLANT
ELECT BLADE 4.0 EZ CLEAN MEGAD (MISCELLANEOUS) ×2
ELECT REM PT RETURN 9FT ADLT (ELECTROSURGICAL) ×2
ELECTRODE BLDE 4.0 EZ CLN MEGD (MISCELLANEOUS) ×1 IMPLANT
ELECTRODE REM PT RTRN 9FT ADLT (ELECTROSURGICAL) ×1 IMPLANT
GAUZE SPONGE 4X4 12PLY STRL (GAUZE/BANDAGES/DRESSINGS) ×2 IMPLANT
GEL ULTRASOUND 20GR AQUASONIC (MISCELLANEOUS) IMPLANT
GLOVE BIO SURGEON STRL SZ 6.5 (GLOVE) ×6 IMPLANT
GOWN STRL REUS W/ TWL LRG LVL3 (GOWN DISPOSABLE) ×2 IMPLANT
GOWN STRL REUS W/TWL LRG LVL3 (GOWN DISPOSABLE) ×2
HEMOSTAT POWDER SURGIFOAM 1G (HEMOSTASIS) ×4 IMPLANT
KIT BASIN OR (CUSTOM PROCEDURE TRAY) ×2 IMPLANT
KIT ROOM TURNOVER OR (KITS) ×2 IMPLANT
KIT SUCTION CATH 14FR (SUCTIONS) ×2 IMPLANT
NS IRRIG 1000ML POUR BTL (IV SOLUTION) ×4 IMPLANT
PACK CHEST (CUSTOM PROCEDURE TRAY) ×2 IMPLANT
PAD ARMBOARD 7.5X6 YLW CONV (MISCELLANEOUS) ×4 IMPLANT
SPONGE GAUZE 4X4 12PLY STER LF (GAUZE/BANDAGES/DRESSINGS) ×2 IMPLANT
SUT PROLENE 4 0 RB 1 (SUTURE)
SUT PROLENE 4-0 RB1 .5 CRCL 36 (SUTURE) IMPLANT
SUT SILK  1 MH (SUTURE) ×2
SUT SILK 1 MH (SUTURE) ×2 IMPLANT
SUT SILK 2 0 SH CR/8 (SUTURE) ×4 IMPLANT
SUT STEEL 6MS V (SUTURE) ×4 IMPLANT
SUT VIC AB 1 CT1 18XCR BRD 8 (SUTURE) IMPLANT
SUT VIC AB 1 CT1 8-18 (SUTURE)
SUT VIC AB 1 CTX 18 (SUTURE) ×4 IMPLANT
SUT VIC AB 2-0 CTX 36 (SUTURE) ×4 IMPLANT
SUT VIC AB 3-0 X1 27 (SUTURE) ×4 IMPLANT
SYSTEM SAHARA CHEST DRAIN RE-I (WOUND CARE) ×2 IMPLANT
TAPE CLOTH SURG 4X10 WHT LF (GAUZE/BANDAGES/DRESSINGS) ×2 IMPLANT
TOWEL OR 17X24 6PK STRL BLUE (TOWEL DISPOSABLE) ×2 IMPLANT
TOWEL OR 17X26 10 PK STRL BLUE (TOWEL DISPOSABLE) ×4 IMPLANT
TRAY FOLEY BAG SILVER LF 16FR (SET/KITS/TRAYS/PACK) ×2 IMPLANT
WATER STERILE IRR 1000ML POUR (IV SOLUTION) ×2 IMPLANT

## 2015-10-28 NOTE — Anesthesia Preprocedure Evaluation (Addendum)
Anesthesia Evaluation  Patient identified by MRN, date of birth, ID band Patient awake    Reviewed: Allergy & Precautions, NPO status , Patient's Chart, lab work & pertinent test results  Airway Mallampati: I  TM Distance: >3 FB Neck ROM: Full    Dental  (+) Teeth Intact, Dental Advisory Given   Pulmonary shortness of breath and with exertion, asthma ,    Pulmonary exam normal breath sounds clear to auscultation       Cardiovascular hypertension, Pt. on medications + CAD and + Peripheral Vascular Disease  Normal cardiovascular exam+ dysrhythmias  Rhythm:Regular Rate:Normal     Neuro/Psych Seizures -, Well Controlled,  Anxiety Depression    GI/Hepatic hiatal hernia, GERD  Medicated and Controlled,(+) Hepatitis -, B  Endo/Other    Renal/GU      Musculoskeletal  (+) Arthritis ,   Abdominal   Peds  Hematology   Anesthesia Other Findings   Reproductive/Obstetrics                            Anesthesia Physical Anesthesia Plan  ASA: III  Anesthesia Plan: General   Post-op Pain Management:    Induction: Intravenous  Airway Management Planned: Oral ETT  Additional Equipment: Arterial line, CVP and Ultrasound Guidance Line Placement  Intra-op Plan:   Post-operative Plan: Extubation in OR  Informed Consent: I have reviewed the patients History and Physical, chart, labs and discussed the procedure including the risks, benefits and alternatives for the proposed anesthesia with the patient or authorized representative who has indicated his/her understanding and acceptance.     Plan Discussed with: CRNA and Surgeon  Anesthesia Plan Comments:         Anesthesia Quick Evaluation

## 2015-10-28 NOTE — Anesthesia Procedure Notes (Signed)
Procedure Name: Intubation Date/Time: 10/28/2015 7:51 AM Performed by: Barrington Ellison Pre-anesthesia Checklist: Patient identified, Emergency Drugs available, Suction available and Patient being monitored Patient Re-evaluated:Patient Re-evaluated prior to inductionOxygen Delivery Method: Circle System Utilized Preoxygenation: Pre-oxygenation with 100% oxygen Intubation Type: IV induction Ventilation: Oral airway inserted - appropriate to patient size Laryngoscope Size: Miller and 2 Grade View: Grade II Tube type: Oral Tube size: 8.0 mm Number of attempts: 1 Airway Equipment and Method: Stylet Placement Confirmation: ETT inserted through vocal cords under direct vision,  positive ETCO2 and breath sounds checked- equal and bilateral Secured at: 24 cm Tube secured with: Tape Dental Injury: Teeth and Oropharynx as per pre-operative assessment  Comments: Intubation performed by Shaune Pollack, SRNA

## 2015-10-28 NOTE — H&P (Signed)
[address]       [place] [address]             [phone]                    Johnny Mccoy [facility] [place] Medical Record [id number] Date of Birth: [birth year]  Referring: Dr Genevive Bi Primary Care: Idelle Crouch, MD  Chief Complaint:    Mediastinal Tumour    History of Present Illness:    Johnny Mccoy 70 y.o. male  seen for  A calcified anterior mediastinal mass, not PET avid. The patient gives a long history of known coronary occlusive disease having had numerous cardiac catheterizations and stents in the past. He is a non-smoker. Because of recurrent episodes of bronchitis before and after a recent trip to [place] CT of the chest was performed. 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 Subsequent PET scan has been performed and the mass is non-avid. He did have some nonenlarged mildly hypermetabolic mediastinal nodes.   The patient has an extensive cardiac history and although he wishes to attribute these symptoms to the mediastinal mass it is unlikely that the incidental finding of this mass is related to any of the symptoms, he complains of deep burning chest discomfort associated with arm numbness, it comes on both at rest and with activity. 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 Since last seen he has had follow-up with cardiology including echocardiogram and Myoview stress test-without evidence of ischemia  He had a CT scan march 207  when he presented to primary care with swollen legs, Dopplers showed no evidence of DVT, CT of the chest showed no evidence of pulmonary emboli. The anterior mediastinal mass was unchanged in size and multiple small pulmonary nodules on the right were also unchanged.   The patient continues to be concerned about the  mediastinal  mass and would like to proceed with surgical resection   Several  Weeks ago  the patient did note a "crick in his neck: this has now completely resolved  Current Activity/ Functional Status:  Patient is independent with mobility/ambulation, transfers, ADL's, IADL's.   Zubrod Score: At the time of surgery this patient's most appropriate activity status/level should be described as: []     0    Normal activity, no symptoms [x]     1    Restricted in physical strenuous activity but ambulatory, able to do out light work []     2    Ambulatory and capable of self care, unable to do work activities, up and about               >50 % of waking hours                              []     3    Only limited self care, in bed greater than 50% of waking hours []     4    Completely disabled, no self care, confined to bed or chair []     5    Moribund   Past Medical History:  Diagnosis Date  . Anxiety    takes Lorazepam daily  . Arthritis   . Asthma    Albuterol  as needed.Breo daily as well as Flovent  . CAD (coronary artery disease)   . Cataract   . Cataracts, bilateral    immature  . Chronic back pain    compression  . Complication of anesthesia    seizures after partial knee in 2014.Surgery was in am and an still in post op until 9 that night. Was sent to [facility] for 5 days d/t uncontrollable shaking,incoherant  . Depressed    takes Pristiq daily  . Essential tremor   . GERD (gastroesophageal reflux disease)    takes Dexilant daily as well as Zantac  . 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 History of bronchitis    Jan-Mar 2017  . History of hiatal hernia   . Hyperlipidemia    takes Atorvastatin daily  . Hypertension    takes HCTZ,Amlodipine,and Metoprolol daily  . Insomnia    takes Trazodone nightly   . Joint pain   . Muscle spasm    in neck.Takes Flexeril as needed  . Numbness    right knee,right hand,and toes on left  . Seizures ([place])    takes Lamotrigine daily. Last seizure  6+ yrs ago    Past Surgical History:  Procedure Laterality Date  . CARDIAC CATHETERIZATION     2015  . CARPAL TUNNEL RELEASE    . COLONOSCOPY    . CORONARY ANGIOPLASTY WITH STENT PLACEMENT     x 3  . dental implants    . ESOPHAGOGASTRODUODENOSCOPY    . EYE SURGERY Bilateral   . EYE SURGERY     both eyes in 60's d/t muscle causing to squint  . HERNIA REPAIR     umbilical  . JOINT REPLACEMENT Right    knee  . KNEE ARTHROSCOPY    . MEDIAL PARTIAL KNEE REPLACEMENT Right   . numerous hand surgery    . right hand fusion    . 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 TONSILLECTOMY      Family History  Problem Relation Age of Onset  . Cancer Father 75    Bone   . COPD Father   . Heart disease Mother   . Hyperlipidemia Mother   . Stroke Mother   . Heart disease Maternal Grandfather   . Hyperlipidemia Maternal Grandfather   Patient's family history significant for his father who died of bone cancer and COPD at age 55 his mother died of a stroke at age 83  Social History   Social History  . Marital status: Married    Spouse name: N/A  . Number of children: 2  . Years of education: N/A   Occupational History  . retired Theme park manager    Social History Main Topics  . Smoking status: Never Smoker  . Smokeless tobacco: Never Used  . Alcohol use Yes     Comment: wine and couple shots of liquor each evening  . Drug use: No  . Sexual activity: Not on file   Other Topics Concern  . Not on file   Social History Narrative  . No narrative on file    History  Smoking Status  . Never Smoker  Smokeless Tobacco  . Never Used    History  Alcohol Use  . Yes    Comment: wine and couple shots of liquor each evening     Allergies  Allergen Reactions  . No Allergies On File     Current Facility-Administered Medications  Medication Dose Route  Frequency Provider Last Rate Last Dose  . cefUROXime (ZINACEF) 1.5 g in dextrose 5 % 50 mL IVPB  1.5 g Intravenous  To SS-Surg [name], MD      . midazolam (VERSED) 2 MG/2ML injection               Review of Systems:     Cardiac Review of Systems: Y or N  Chest Pain [  y  ]  Resting SOB [ n  ] Exertional SOB  Blue.Reese  ]  Orthopnea [ n ]   Pedal Edema [  n ]    Palpitations [n  ] Syncope  [ n ]   Presyncope [ n  ]  General Review of Systems: [Y] = yes [  ]=no Constitional: recent weight change [  ];  Wt loss over the last 3 months [   ] anorexia [  ]; fatigue [  ]; nausea [  ]; night sweats [  ]; fever [  ]; or chills [  ];          Dental: poor dentition[  ]; Last Dentist visit:   Eye : blurred vision [  ]; diplopia [   ]; vision changes [  ];  Amaurosis fugax[  ]; Resp: cough Blue.Reese  ];  wheezing[y  ];  hemoptysis[n  ]; shortness of breath[  ]; paroxysmal nocturnal dyspnea[  ]; dyspnea on exertion[ y ]; or orthopnea[  ];  GI:  gallstones[  ], vomiting[  ];  dysphagia[  ]; melena[  ];  hematochezia [  ]; heartburn[  ];   Hx of  Colonoscopy[  ]; GU: kidney stones [  ]; hematuria[  ];   dysuria [  ];  nocturia[  ];  history of     obstruction [  ]; urinary frequency [  ]             Skin: rash, swelling[  ];, hair loss[  ];  peripheral edema[  ];  or itching[  ]; Musculosketetal: myalgias[  ];  joint swelling[  ];  joint erythema[  ];  joint pain[  ];  back pain[  ];  Heme/Lymph: bruising[  ];  bleeding[  ];  anemia[  ];  Neuro: TIA[  ];  headaches[  ];  stroke[  ];  vertigo[  ];  seizures[  ];   paresthesias[  ];  difficulty walking[  ];  Psych:depression[  ]; anxiety[  ];  Endocrine: diabetes[  ];  thyroid dysfunction[  ];  Immunizations: Flu up to date [  ]; Pneumococcal up to date [  ];  Other:  Physical Exam: BP (!) 149/69   Pulse 71   Temp 98.8 F (37.1 C) (Oral)   Resp 20   Wt 219 lb (99.3 kg)   SpO2 95%   BMI 33.79 kg/m   PHYSICAL EXAMINATION: General appearance: alert, cooperative, appears older than stated age and no distress Head: Normocephalic, without obvious abnormality,  atraumatic Neck: no adenopathy, no carotid bruit, no JVD, supple, symmetrical, trachea midline and thyroid not enlarged, symmetric, no tenderness/mass/nodules Lymph nodes: Cervical, supraclavicular, and axillary nodes normal. Resp: clear to auscultation bilaterally Back: symmetric, no curvature. ROM normal. No CVA tenderness. Cardio: regular rate and rhythm, S1, S2 normal, no murmur, click, rub or gallop GI: soft, non-tender; bowel sounds normal; no masses,  no organomegaly Extremities: extremities normal, atraumatic, no cyanosis ,Homans sign is negative, no sign of DVT, very mild pedal edema bilaterally, pulses are  intact, his upper extremities have good and normal strength bilaterally Neurologic: Grossly normal  Diagnostic Studies & Laboratory data:     Recent Radiology Findings:  CLINICAL DATA: Mid chest pain and fatigue. Short of breath and bilateral lower extremity weakness. History of hepatitis.  EXAM: CT ANGIOGRAPHY CHEST WITH CONTRAST  TECHNIQUE: Multidetector CT imaging of the chest was performed using the standard protocol during bolus administration of intravenous contrast. Multiplanar CT image reconstructions and MIPs were obtained to evaluate the vascular anatomy.  CONTRAST: 80 cc of Isovue 370  COMPARISON: 05/07/2015  FINDINGS: Mediastinum/Nodes: The quality of this exam for evaluation of pulmonary embolism is moderate. The primary limitation is suboptimal bolus timing, with contrast centered in the SVC. There is also minimal motion degradation in the lower chest. No pulmonary embolism to the large segmental level.  Tortuous thoracic aorta. Mild cardiomegaly. Multivessel coronary artery atherosclerosis. No pericardial effusion. No mediastinal or hilar adenopathy.  Anterior mediastinal partially calcified mass is again identified, on the order of 4.2 x 2.5 cm versus similar on the prior.  Lungs/Pleura: No pleural fluid. 3 mm right lower lobe  pulmonary nodule is unchanged on image 46/ series 5.  No lobar consolidation.  Upper abdomen: Caudate lobe enlargement with subtle medial segment left liver lobe atrophy. Probable cyst in the medial segment left liver lobe. Normal imaged portions of the spleen, stomach, pancreas, gallbladder, adrenal glands, kidneys.  Musculoskeletal: No acute osseous abnormality.  Review of the MIP images confirms the above findings.  IMPRESSION: 1. No evidence of pulmonary embolism to the large segmental level. Mildly degraded exam secondary to suboptimal bolus timing. 2. Cardiomegaly. Atherosclerosis, including within the coronary arteries. 3. Mild cirrhosis. 4. Anterior mediastinal mass, similar. Please see prior CT and PET reports. 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 5. Nonspecific right lower lobe 3 mm pulmonary nodule, as before. These results will be called to the ordering clinician or representative by the Radiology Department at the imaging location.   Electronically Signed  By: Abigail Miyamoto M.D.  On: 08/01/2015 11:08     Ct Angio Chest Pe W/cm &/or Wo Cm  05/07/2015  CLINICAL DATA:  Shortness of breath on exertion with left upper chest pain for 3 weeks. Recent severe bronchitis. Evaluate for pulmonary embolism. EXAM: CT ANGIOGRAPHY CHEST WITH CONTRAST TECHNIQUE: Multidetector CT imaging of the chest was performed using the standard protocol during bolus administration of intravenous contrast. Multiplanar CT image reconstructions and MIPs were obtained to evaluate the vascular anatomy. CONTRAST:  181mL OMNIPAQUE IOHEXOL 350 MG/ML SOLN COMPARISON:  None. FINDINGS: Mediastinum: The pulmonary arteries are adequately opacified with contrast. There is no evidence of acute pulmonary embolism. There is moderate atherosclerosis of the coronary arteries. The heart size is normal. There is no pericardial effusion.There is a partially calcified anterior  mediastinal mass, located the pre-vascular fat. This measures 4.6 x 2.6 cm on images 30. There are no enlarged mediastinal, hilar or axillary lymph nodes. The thyroid gland, trachea and esophagus demonstrate no significant findings. Lungs/Pleura: There is no pleural effusion.There is a 3 mm perifissural nodule along the right minor fissure (image 46). There is a tiny subpleural right middle lobe nodule on image 46. The small nodules are likely incidental and benign. No suspicious pulmonary nodules. The lungs are otherwise clear. Upper abdomen: The hepatic density is diffusely decreased consistent with steatosis. No focal hepatic abnormalities are seen. The adrenal glands appear normal. Musculoskeletal/Chest wall: No chest wall lesion or acute osseous findings.  Review of the MIP images confirms the above findings. IMPRESSION: 1. No evidence of acute pulmonary embolism or other acute chest process. 2. 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 Less likely considerations include germ-cell tumor. Thoracic surgical consultation recommended. 3. Coronary artery atherosclerosis. 4. Hepatic steatosis. 5. These results will be called to the ordering clinician or representative by the Radiologist Assistant, and communication documented in the PACS or zVision Dashboard. Electronically Signed   By: Richardean Sale M.D.   On: 05/07/2015 15:32   Nm Pet Image Initial (pi) Skull Base To Thigh  05/21/2015  CLINICAL DATA:  Initial treatment strategy for calcified anterior mediastinal mass detected on recent chest CT angiogram performed in the setting of shortness breath. EXAM: NUCLEAR MEDICINE PET SKULL BASE TO THIGH TECHNIQUE: 12.2 mCi F-18 FDG was injected intravenously. Full-ring PET imaging was performed from the skull base to thigh after the radiotracer. CT data was obtained and used for attenuation correction and anatomic localization. FASTING BLOOD GLUCOSE:  Value: 125 mg/dl  COMPARISON:  05/07/2015 chest CT angiogram. FINDINGS: NECK No hypermetabolic lymph nodes in the neck. CHEST Partially calcified 4.6 x 2.2 cm irregular mass in the anterior mediastinum (series 3/image 91) is non hypermetabolic with max SUV 2.3 (compared to mediastinal blood pool activity with max SUV 3.0). There are nonenlarged hypermetabolic right paratracheal nodes, largest 0.7 cm (series 3/image 83) with max SUV 5.7. There is a hypermetabolic nonenlarged 0.8 cm right infrahilar node (series 3/image 96) with max SUV 8.0. There is a hypermetabolic nonenlarged 0.7 cm left hilar node (series 3/image 95) with max SUV 5.7. No hypermetabolic axillary nodes. Extensive coronary atherosclerosis. Stable 3 mm anterior right lower lobe pulmonary nodule (series 3/image 103), below PET resolution. No acute consolidative airspace disease or additional significant pulmonary nodules. No pleural effusions. ABDOMEN/PELVIS No abnormal hypermetabolic activity within the liver, pancreas, adrenal glands, or spleen. No hypermetabolic lymph nodes in the abdomen or pelvis. Mild-to-moderate diffuse hepatic steatosis. Mild prostatomegaly. Small fat containing bilateral inguinal hernias. SKELETON No focal hypermetabolic activity to suggest skeletal metastasis. IMPRESSION: 1. Non hypermetabolic partially calcified 4.6 cm irregular anterior mediastinal mass. 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 2. Hypermetabolic subcentimeter right paratracheal and bilateral hilar lymph nodes, nonspecific, metastatic disease cannot be excluded. 3. Solitary 3 mm right lower lobe pulmonary nodule, below PET resolution. 4. No hypermetabolic extrathoracic disease. 5. Additional findings include coronary atherosclerosis, diffuse hepatic steatosis, mild prostatomegaly and small bilateral fat containing inguinal hernias. Electronically Signed   By: Ilona Sorrel M.D.   On: 05/21/2015 12:32     I have independently reviewed the  above radiologic studies.  Recent Lab Findings: Lab Results  Component Value Date   WBC 4.7 10/28/2015   HGB 14.3 10/28/2015   HCT 40.3 10/28/2015   PLT PENDING 10/28/2015   GLUCOSE 112 (H) 10/23/2015   ALT 33 10/23/2015   AST 30 10/23/2015   NA 130 (L) 10/23/2015   K 3.7 10/23/2015   CL 92 (L) 10/23/2015   CREATININE 0.93 10/23/2015   BUN 9 10/23/2015   CO2 26 10/23/2015   INR 0.99 10/23/2015   Stress Test  Done  outpatient : 06/20/2015  Normal treadmill EKG without evidence of ischemia or arrhythmia Normal myocardial perfusion without evidence of myocardial ischemia  although there is some artifact  Serafina Royals   Result Narrative  [facility] [address] [place], [place]  [address] [phone]  Procedure: Exercise Myocardial Perfusion Imaging   ONE day procedure Indication: Coronary artery disease of native artery of native heart with  stable angina pectoris (CMS-HCC) - Plan: NM myocardial perfusion SPECT  multiple (stress and rest), ECG stress test only Stable angina pectoris (CMS-HCC) - Plan: NM myocardial perfusion SPECT  multiple (stress and rest), ECG stress test only Ordering Physician:  Dr. Serafina Royals Clinical History: 70 y.o. year old male Vitals: Height: 77 in  Weight: 216 lb Cardiac risk factors include:   VHD, Hyperlipidemia, PCI, HTN and CAD  Procedure: The patient performed treadmill exercise using a Bruce protocol for 9:01  minutes. The exercise test was stopped due to fatigue.  Blood pressure  response was normal.  Rest HR: 67bpm Rest BP: 134/72mmHg Max HR: 121bpm Max BP: 164/38mmHg Mets:     10.4 % MAX HR:   80% Stress Test Administered by: Oswald Hillock, CMA ECG Interpretation: Rest ECG:  normal sinus rhythm, none Stress ECG:  normal sinus rhythm,   Recovery ECG:  normal sinus rhythm ECG Interpretation:  negative, no ECG changes. Administrations This Visit     technetium Tc41m sestamibi (CARDIOLITE) injection [id number] millicurie    Admin Date Action Dose Route Administered By   [id number] Given [id number] millicurie Intravenous Ane Payment, CNMT        technetium Tc69m sestamibi (CARDIOLITE) injection [id number] millicurie    Admin Date Action Dose Route Administered By  [id number] Given [id number] millicurie Intravenous Scott N Goard, CNMT         Gated post-stress perfusion imaging was performed 30 minutes after stress.  Rest images were performed 30 minutes after injection.  Gated LV Analysis:  TID:  1.1  LVEF= 52%  FINDINGS: Regional wall motion:  reveals normal myocardial thickening and wall  motion. The overall quality of the study is fair.   Artifacts noted: yes Left ventricular cavity: normal.  Perfusion Analysis:  SPECT images demonstrate homogeneous tracer  distribution throughout the myocardium.        Assessment / Plan:   #1  Non hypermetabolic partially calcified 4.6 cm irregular anterior mediastinal mass. 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 #2  Hypermetabolic subcentimeter right paratracheal and bilateral hilar lymph nodes, nonspecific, #3 known coronary occlusive disease #4 disability from previous stroke 1999  I discussed with the patient and his wife proceeding with resection of the thymic tumor through partial upper sternotomy .   The goals risks and alternatives of the planned surgical procedure resection of anterior mediastinal mass  have been discussed with the patient in detail. The risks of the procedure including death, infection, stroke, myocardial infarction, bleeding, blood transfusion have all been discussed specifically.  I have quoted [name] a 2 % of perioperative mortality and a complication rate as high as 25 %. The patient's questions have been answered.[name] is willing  to proceed with the planned procedure.      [name] MD      [address] [place] [address] Office [phone]   Beeper [phone]  10/28/2015 7:20 AM

## 2015-10-28 NOTE — Anesthesia Procedure Notes (Signed)
Central Venous Catheter Insertion Performed by: anesthesiologist 10/28/2015 7:10 AM Patient location: Pre-op. Preanesthetic checklist: patient identified, IV checked, site marked, risks and benefits discussed, surgical consent, monitors and equipment checked, pre-op evaluation, timeout performed and anesthesia consent Position: Trendelenburg Lidocaine 1% used for infiltration Landmarks identified and Seldinger technique used Catheter size: 8 Fr Central line was placed.Double lumen Procedure performed using ultrasound guided technique. Attempts: 1 Following insertion, dressing applied, line sutured and Biopatch. Post procedure assessment: blood return through all ports, free fluid flow and no air. Patient tolerated the procedure well with no immediate complications.

## 2015-10-28 NOTE — Brief Op Note (Addendum)
      [address]       [place] [address]             [phone]     10/28/2015  10:50 AM  PATIENT:  Johnny Mccoy  70 y.o. male  PRE-OPERATIVE DIAGNOSIS:  MEDIASTINAL MASS  POST-OPERATIVE DIAGNOSIS:  MEDIASTINAL MASS  PROCEDURE:  Procedure(s): RESECTION OF anterier MEDIASTINAL TUMOR (N/A) PARTIAL STERNOTOMY (N/A)  SURGEON:  Surgeon(s) and Role:    * [name], MD - Primary  PHYSICIAN ASSISTANT: WAYNE GOLD PA-C  ASSISTANTS: NO OTHER  ANESTHESIA:   general  EBL:  Total I/O In: 2000 [I.V.:2000] Out: 400 [Urine:300; Blood:100]  BLOOD ADMINISTERED:none  DRAINS: (1) Blake drain(s) in the MEDIASTINUM   LOCAL MEDICATIONS USED:  NONE  SPECIMEN:  Source of Specimen:  ANTERIOR MEDIASTINAL MASS  DISPOSITION OF SPECIMEN:  PATHOLOGY  COUNTS:  YES   DICTATION: .Other Dictation: Dictation Number PENDING  PLAN OF CARE: Admit to inpatient   PATIENT DISPOSITION:  PACU - hemodynamically stable.   Delay start of Pharmacological VTE agent (>24hrs) due to surgical blood loss or risk of bleeding: yes  FROZEN: NEOPLASM

## 2015-10-28 NOTE — Progress Notes (Signed)
Patient ID: Johnny Mccoy, male   DOB: 1946-02-26, 70 y.o.   MRN: [id number] EVENING ROUNDS NOTE :     [address]       [place] [address]             [phone]                 Day of Surgery Procedure(s) (LRB): RESECTION OF anterier MEDIASTINAL TUMOR (N/A) PARTIAL STERNOTOMY (N/A)  Total Length of Stay:  LOS: 0 days  BP 130/77   Pulse 84   Temp 98.6 F (37 C)   Resp 17   Wt 219 lb (99.3 kg)   SpO2 90%   BMI 33.79 kg/m   .Intake/Output      08/27 0701 - 08/28 0700 08/28 0701 - 08/29 0700   I.V. (mL/kg)  2600 (26.2)   Other  300   Total Intake(mL/kg)  2900 (29.2)   Urine (mL/kg/hr)  400 (0.3)   Blood  100 (0.1)   Chest Tube  70 (0.1)   Total Output   570   Net   +2330          . dextrose 5 % and 0.9% NaCl 100 mL/hr at 10/28/15 1219     Lab Results  Component Value Date   WBC 4.7 10/28/2015   HGB 14.3 10/28/2015   HCT 40.3 10/28/2015   PLT 106 (L) 10/28/2015   GLUCOSE 112 (H) 10/23/2015   ALT 33 10/23/2015   AST 30 10/23/2015   NA 130 (L) 10/23/2015   K 3.7 10/23/2015   CL 92 (L) 10/23/2015   CREATININE 0.93 10/23/2015   BUN 9 10/23/2015   CO2 26 10/23/2015   INR 0.99 10/23/2015   Dg Chest 2 View  Result Date: 10/28/2015 CLINICAL DATA:  Preoperative evaluation for mediastinal mass. EXAM: CHEST  2 VIEW COMPARISON:  CT chest August 01, 2015 FINDINGS: Cardiomediastinal silhouette is normal. No pleural effusions or focal consolidations. Strandy densities LEFT lung base. Trachea projects midline and there is no pneumothorax. Soft tissue planes and included osseous structures are non-suspicious. IMPRESSION: LEFT lung base atelectasis. Electronically Signed   By: Elon Alas M.D.   On: 10/28/2015 06:34   Dg Chest Port 1 View  Result Date: 10/28/2015 CLINICAL DATA:  Status post median sternotomy for a mediastinal mass. EXAM: PORTABLE CHEST 1 VIEW COMPARISON:  10/28/2015 FINDINGS: Since the prior exam, the median sternotomy and performed  before wires projecting along the upper sternum. There is no mediastinal widening. Cardiac silhouette is normal in size. Mild atelectasis noted at the medial lung bases. Lungs are otherwise clear. No pneumothorax. The chest for mediastinal tube extends from the region of the jugular notch to have its tip projecting over the left heart border. A right internal jugular central venous line has its tip just above the caval atrial junction. IMPRESSION: 1. Status post median sternotomy. No evidence of an operative complication. Specifically, no mediastinal widening, pneumothorax or pulmonary edema. 2. Mild basilar atelectasis. 3. Support apparatus is well positioned. Electronically Signed   By: Lajean Manes M.D.   On: 10/28/2015 10:56   Partial sternotomy - correction of above   Awake and talkative, mildly confused  [name] MD  Beeper [phone] Office [phone] 10/28/2015 6:42 PM

## 2015-10-28 NOTE — Transfer of Care (Signed)
Immediate Anesthesia Transfer of Care Note  Patient: Johnny Mccoy  Procedure(s) Performed: Procedure(s): RESECTION OF anterier MEDIASTINAL TUMOR (N/A) PARTIAL STERNOTOMY (N/A)  Patient Location: PACU  Anesthesia Type:General  Level of Consciousness: lethargic and responds to stimulation  Airway & Oxygen Therapy: Patient Spontanous Breathing and Patient connected to face mask oxygen  Post-op Assessment: Report given to RN  Post vital signs: Reviewed and stable  Last Vitals:  Vitals:   10/28/15 0617  BP: (!) 149/69  Pulse: 71  Resp: 20  Temp: 37.1 C    Last Pain:  Vitals:   10/28/15 0617  TempSrc: Oral         Complications: No apparent anesthesia complications

## 2015-10-28 NOTE — Addendum Note (Signed)
Addendum  created 10/28/15 1444 by Barrington Ellison, CRNA   Charge Capture section accepted

## 2015-10-28 NOTE — Anesthesia Postprocedure Evaluation (Signed)
Anesthesia Post Note  Patient: Johnny Mccoy  Procedure(s) Performed: Procedure(s) (LRB): RESECTION OF anterier MEDIASTINAL TUMOR (N/A) PARTIAL STERNOTOMY (N/A)  Patient location during evaluation: PACU Anesthesia Type: General Level of consciousness: awake and alert Pain management: pain level controlled Vital Signs Assessment: post-procedure vital signs reviewed and stable Respiratory status: spontaneous breathing, nonlabored ventilation, respiratory function stable and patient connected to nasal cannula oxygen Cardiovascular status: blood pressure returned to baseline and stable Postop Assessment: no signs of nausea or vomiting Anesthetic complications: no    Last Vitals:  Vitals:   10/28/15 1115 10/28/15 1200  BP: 114/73 115/62  Pulse: 72 74  Resp: 14 12  Temp: 36.6 C     Last Pain:  Vitals:   10/28/15 1115  TempSrc:   PainSc: 5                  [name] DAVID

## 2015-10-29 ENCOUNTER — Inpatient Hospital Stay (HOSPITAL_COMMUNITY): Payer: Medicare Other

## 2015-10-29 ENCOUNTER — Encounter (HOSPITAL_COMMUNITY): Payer: Self-pay | Admitting: Cardiothoracic Surgery

## 2015-10-29 LAB — POCT I-STAT 3, ART BLOOD GAS (G3+)
Acid-Base Excess: 7 mmol/L — ABNORMAL HIGH (ref 0.0–2.0)
Bicarbonate: 32.6 mmol/L — ABNORMAL HIGH (ref 20.0–28.0)
O2 Saturation: 94 %
Patient temperature: 98.5
TCO2: 34 mmol/L (ref 0–100)
pCO2 arterial: 51.5 mmHg — ABNORMAL HIGH (ref 32.0–48.0)
pH, Arterial: 7.41 (ref 7.350–7.450)
pO2, Arterial: 70 mmHg — ABNORMAL LOW (ref 83.0–108.0)

## 2015-10-29 LAB — BASIC METABOLIC PANEL
Anion gap: 6 (ref 5–15)
BUN: 8 mg/dL (ref 6–20)
CO2: 30 mmol/L (ref 22–32)
Calcium: 8 mg/dL — ABNORMAL LOW (ref 8.9–10.3)
Chloride: 92 mmol/L — ABNORMAL LOW (ref 101–111)
Creatinine, Ser: 0.81 mg/dL (ref 0.61–1.24)
GFR calc Af Amer: >60 mL/min (ref >60)
GFR calc non Af Amer: >60 mL/min (ref >60)
Glucose, Bld: 127 mg/dL — ABNORMAL HIGH (ref 65–99)
Potassium: 3.5 mmol/L (ref 3.5–5.1)
Sodium: 128 mmol/L — ABNORMAL LOW (ref 135–145)

## 2015-10-29 LAB — CBC
HCT: 35.4 % — ABNORMAL LOW (ref 39.0–52.0)
Hemoglobin: 12.2 g/dL — ABNORMAL LOW (ref 13.0–17.0)
MCH: 32.1 pg (ref 26.0–34.0)
MCHC: 34.5 g/dL (ref 30.0–36.0)
MCV: 93.2 fL (ref 78.0–100.0)
Platelets: 95 10*3/uL — ABNORMAL LOW (ref 150–400)
RBC: 3.8 MIL/uL — ABNORMAL LOW (ref 4.22–5.81)
RDW: 12.7 % (ref 11.5–15.5)
WBC: 6.7 10*3/uL (ref 4.0–10.5)

## 2015-10-29 LAB — GLUCOSE, CAPILLARY: Glucose-Capillary: 108 mg/dL — ABNORMAL HIGH (ref 65–99)

## 2015-10-29 MED ORDER — ENOXAPARIN SODIUM 40 MG/0.4ML ~~LOC~~ SOLN
40.0000 mg | SUBCUTANEOUS | Status: DC
  Administered 2015-10-29 – 2015-10-31 (×3): 40 mg via SUBCUTANEOUS
  Filled 2015-10-29 (×3): qty 0.4

## 2015-10-29 NOTE — Progress Notes (Signed)
Patient ID: Johnny Mccoy, male   DOB: 1945-08-17, 70 y.o.   MRN: [id number] TCTS DAILY ICU PROGRESS NOTE                   [address]            [place] [address]          [phone]   1 Day Post-Op Procedure(s) (LRB): RESECTION OF anterier MEDIASTINAL TUMOR (N/A) PARTIAL STERNOTOMY (N/A)  Total Length of Stay:  LOS: 1 day   Subjective: Sleepy   Objective: Vital signs in last 24 hours: Temp:  [97.5 F (36.4 C)-98.6 F (37 C)] 98.5 F (36.9 C) (08/29 0400) Pulse Rate:  [72-93] 79 (08/29 0700) Cardiac Rhythm: Normal sinus rhythm (08/29 0400) Resp:  [5-25] 15 (08/29 0715) BP: (101-133)/(52-85) 123/69 (08/29 0700) SpO2:  [87 %-98 %] 94 % (08/29 0715) Arterial Line BP: (98-141)/(40-95) 119/71 (08/29 0700)  Filed Weights   10/28/15 0617  Weight: 219 lb (99.3 kg)    Weight change:    Hemodynamic parameters for last 24 hours:    Intake/Output from previous day: 08/28 0701 - 08/29 0700 In: 4254.3 [P.O.:300; I.V.:3454.3; IV Piggyback:200] Out: 2035 [Urine:1775; Blood:100; Chest Tube:160]  Intake/Output this shift: Total I/O In: 50 [IV Piggyback:50] Out: -   Current Meds: Scheduled Meds: . acetaminophen  1,000 mg Oral Q6H   Or  . acetaminophen (TYLENOL) oral liquid 160 mg/5 mL  1,000 mg Oral Q6H  . amLODipine  5 mg Oral Daily  . aspirin EC  81 mg Oral Daily  . atorvastatin  80 mg Oral q1800  . bisacodyl  10 mg Oral Daily  . cefUROXime (ZINACEF)  IV  1.5 g Intravenous Q12H  . famotidine  40 mg Oral QHS  . fentaNYL   Intravenous Q4H  . fluticasone furoate-vilanterol  1 puff Inhalation Daily  . hydrochlorothiazide  25 mg Oral Daily  . lamoTRIgine  100 mg Oral Daily  . loratadine  10 mg Oral Daily  . mouth rinse  15 mL Mouth Rinse BID  . memantine  28 mg Oral Daily  . metoprolol succinate  50 mg Oral Daily  . senna-docusate  1 tablet Oral QHS  . venlafaxine XR  150 mg Oral Q breakfast   Continuous Infusions: . dextrose 5 % and 0.9% NaCl  100 mL/hr at 10/29/15 0400   PRN Meds:.albuterol, diphenhydrAMINE **OR** diphenhydrAMINE, naloxone **AND** sodium chloride flush, nitroGLYCERIN, ondansetron (ZOFRAN) IV, ondansetron (ZOFRAN) IV, oxyCODONE, potassium chloride, traMADol, traZODone  General appearance: alert and cooperative Neurologic: intact Heart: regular rate and rhythm, S1, S2 normal, no murmur, click, rub or gallop Lungs: clear to auscultation bilaterally Abdomen: soft, non-tender; bowel sounds normal; no masses,  no organomegaly Extremities: extremities normal, atraumatic, no cyanosis or edema and Homans sign is negative, no sign of DVT Wound: stable, no air leak from tube   Lab Results: CBC: Recent Labs  10/28/15 0613 10/29/15 0415  WBC 4.7 6.7  HGB 14.3 12.2*  HCT 40.3 35.4*  PLT 106* 95*   BMET:  Recent Labs  10/29/15 0415  NA 128*  K 3.5  CL 92*  CO2 30  GLUCOSE 127*  BUN 8  CREATININE 0.81  CALCIUM 8.0*    CMET: Lab Results  Component Value Date   WBC 6.7 10/29/2015   HGB 12.2 (L) 10/29/2015   HCT 35.4 (L) 10/29/2015   PLT 95 (L) 10/29/2015   GLUCOSE 127 (H) 10/29/2015   ALT 33 10/23/2015  AST 30 10/23/2015   NA 128 (L) 10/29/2015   K 3.5 10/29/2015   CL 92 (L) 10/29/2015   CREATININE 0.81 10/29/2015   BUN 8 10/29/2015   CO2 30 10/29/2015   INR 0.99 10/23/2015    PT/INR: No results for input(s): LABPROT, INR in the last 72 hours. Radiology: Dg Chest Port 1 View  Result Date: 10/29/2015 CLINICAL DATA:  Chest tube placement.  CABG. EXAM: PORTABLE CHEST 1 VIEW COMPARISON:  10/28/2015. FINDINGS: Chest tube over the mid chest is unchanged in position. Right IJ line in stable position. No pneumothorax. Prior median sternotomy. Stable cardiomegaly. Unchanged bibasilar atelectasis and/or infiltrates. Small left pleural effusion. No pneumothorax. IMPRESSION: 1. Chest tube over the mid chest is unchanged in position. Right IJ line stable position. No pneumothorax. 2.  Prior median sternotomy.   Stable cardiomegaly. 3. Persistent bibasilar atelectasis and/or infiltrates without interim change. Small left pleural effusion. Electronically Signed   By: Marcello Moores  Register   On: 10/29/2015 07:09   Dg Chest Port 1 View  Result Date: 10/28/2015 CLINICAL DATA:  Status post median sternotomy for a mediastinal mass. EXAM: PORTABLE CHEST 1 VIEW COMPARISON:  10/28/2015 FINDINGS: Since the prior exam, the median sternotomy and performed before wires projecting along the upper sternum. There is no mediastinal widening. Cardiac silhouette is normal in size. Mild atelectasis noted at the medial lung bases. Lungs are otherwise clear. No pneumothorax. The chest for mediastinal tube extends from the region of the jugular notch to have its tip projecting over the left heart border. A right internal jugular central venous line has its tip just above the caval atrial junction. IMPRESSION: 1. Status post median sternotomy. No evidence of an operative complication. Specifically, no mediastinal widening, pneumothorax or pulmonary edema. 2. Mild basilar atelectasis. 3. Support apparatus is well positioned. Electronically Signed   By: Lajean Manes M.D.   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

## 2015-10-29 NOTE — Op Note (Signed)
NAMEMarland Kitchen  CHANEY, SY NO.:  [id number]  MEDICAL RECORD NO.:  [id number]  LOCATION:  2S10C                        FACILITY:  [facility]  PHYSICIAN:  Lanelle Bal, MD    DATE OF BIRTH:  Jan 19, 1946  DATE OF PROCEDURE:  10/28/2015 DATE OF DISCHARGE:                              OPERATIVE REPORT   PREOPERATIVE DIAGNOSIS:  Anterior mediastinal mass.  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  PROCEDURE PERFORMED:  Resection of anterior mediastinal tumor through partial sternotomy.  SURGEON:  Lanelle Bal, MD.  FIRST ASSISTANT:  Jadene Pierini, [place].  BRIEF HISTORY:  The patient is a 70 year old male, who was noted to have an anterior mediastinal mass on a CT scan done to rule out PE.  PET scan showed this to be non-avid.  There were portions of it that were calcified.  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  After extensive workup and evaluation by Cardiology and the patient's concern, we proceeded with recommending surgical resection.  Risks and options were discussed with the patient and he agreed and signed informed consent.  DESCRIPTION OF PROCEDURE:  The patient with central line and arterial line in place underwent general endotracheal anesthesia without incident.  The anterior chest was prepped with Betadine and draped in sterile manner.  Appropriate time-out was performed.  We then proceeded with a midline incision at the upper portion of the sternum and down to just slightly below the manubrium.  A sternal saw was used to divide the bone.  Laminar spreader and then small retractor were placed.  This gave [place] access to the anterior mediastinum.  There was extensive mediastinal fat and was dissected free.  The mass was easily located.  It was not adherent to or invading the surrounding structures.   It did enter into the left pleural space.  The mass was excised in its entirety.  The capsule was submitted to Pathology for frozen section.  The report only notes neoplasia.  The pericardium was intact and not violated.  A Blake drain was brought out through a separate site at the upper right sternum and placed into the left base.  With the operative field hemostatic, four #6 stainless sternal wires were placed through the sternum and the upper portion of the sternum was reapproximated.  Interrupted 0 Vicryl sutures were placed, running 2-0 Vicryl in subcutaneous tissue, and a 4- 0 subcuticular stitch in the skin edges.  Dry dressings were applied. Sponge and needle count was reported as correct at the completion of the procedure.  Blood loss was minimal.  The patient tolerated the procedure without obvious complication.  He was extubated in the operating room and transferred to the recovery room for further postoperative care.     Lanelle Bal, MD     EG/MEDQ  D:  10/28/2015  T:  10/29/2015  Job:  [id number]  cc:   Idelle Crouch, MD

## 2015-10-29 NOTE — Progress Notes (Signed)
Pt transported to 3S16 via wheelchair; RN at bedside upon arrival; pt assisted to chair; VSS; wife made aware of new room assignment; SCD's, chart, and pt belongings along for transfer; will cont. To monitor.  Ruben Reason

## 2015-10-29 NOTE — Care Management Note (Signed)
Case Management Note  Patient Details  Name: TERREZ ROSALES MRN: [id number] Date of Birth: March 05, 1945  Subjective/Objective:   S/p removal of mediastinal mass via partial sternotomy                 Action/Plan:  PTA independent from home with wife.  CM will continue to follow for discharge needs   Expected Discharge Date:                  Expected Discharge Plan:  Home/Self Care  In-House Referral:     Discharge planning Services  CM Consult  Post Acute Care Choice:    Choice offered to:     DME Arranged:    DME Agency:     HH Arranged:    HH Agency:     Status of Service:  In process, will continue to follow  If discussed at Long Length of Stay Meetings, dates discussed:    Additional Comments:  Maryclare Labrador, RN 10/29/2015, 1:49 PM

## 2015-10-29 NOTE — Progress Notes (Signed)
Report called to Raquel Sarna, RN on 3S; pt to transfer to 3S16; attempted to call wife to make her aware; will cont. To monitor.  Ruben Reason

## 2015-10-29 NOTE — Progress Notes (Signed)
Attempted to call report; RN unable to take report at this time; will try again later.  Johnny Mccoy

## 2015-10-30 ENCOUNTER — Inpatient Hospital Stay (HOSPITAL_COMMUNITY): Payer: Medicare Other

## 2015-10-30 LAB — BASIC METABOLIC PANEL
Anion gap: 9 (ref 5–15)
BUN: 8 mg/dL (ref 6–20)
CO2: 33 mmol/L — ABNORMAL HIGH (ref 22–32)
Calcium: 8.9 mg/dL (ref 8.9–10.3)
Chloride: 87 mmol/L — ABNORMAL LOW (ref 101–111)
Creatinine, Ser: 0.93 mg/dL (ref 0.61–1.24)
GFR calc Af Amer: >60 mL/min (ref >60)
GFR calc non Af Amer: >60 mL/min (ref >60)
Glucose, Bld: 95 mg/dL (ref 65–99)
Potassium: 3.5 mmol/L (ref 3.5–5.1)
Sodium: 129 mmol/L — ABNORMAL LOW (ref 135–145)

## 2015-10-30 LAB — CBC
HCT: 35.2 % — ABNORMAL LOW (ref 39.0–52.0)
Hemoglobin: 12.3 g/dL — ABNORMAL LOW (ref 13.0–17.0)
MCH: 32.4 pg (ref 26.0–34.0)
MCHC: 34.9 g/dL (ref 30.0–36.0)
MCV: 92.6 fL (ref 78.0–100.0)
Platelets: 96 10*3/uL — ABNORMAL LOW (ref 150–400)
RBC: 3.8 MIL/uL — ABNORMAL LOW (ref 4.22–5.81)
RDW: 12.4 % (ref 11.5–15.5)
WBC: 7.2 10*3/uL (ref 4.0–10.5)

## 2015-10-30 LAB — COMPREHENSIVE METABOLIC PANEL
ALT: 25 U/L (ref 17–63)
AST: 23 U/L (ref 15–41)
Albumin: 3.3 g/dL — ABNORMAL LOW (ref 3.5–5.0)
Alkaline Phosphatase: 46 U/L (ref 38–126)
Anion gap: 6 (ref 5–15)
BUN: 8 mg/dL (ref 6–20)
CO2: 34 mmol/L — ABNORMAL HIGH (ref 22–32)
Calcium: 8.5 mg/dL — ABNORMAL LOW (ref 8.9–10.3)
Chloride: 89 mmol/L — ABNORMAL LOW (ref 101–111)
Creatinine, Ser: 0.78 mg/dL (ref 0.61–1.24)
GFR calc Af Amer: >60 mL/min (ref >60)
GFR calc non Af Amer: >60 mL/min (ref >60)
Glucose, Bld: 104 mg/dL — ABNORMAL HIGH (ref 65–99)
Potassium: 3.3 mmol/L — ABNORMAL LOW (ref 3.5–5.1)
Sodium: 129 mmol/L — ABNORMAL LOW (ref 135–145)
Total Bilirubin: 1.4 mg/dL — ABNORMAL HIGH (ref 0.3–1.2)
Total Protein: 5.3 g/dL — ABNORMAL LOW (ref 6.5–8.1)

## 2015-10-30 LAB — MAGNESIUM: Magnesium: 1.7 mg/dL (ref 1.7–2.4)

## 2015-10-30 MED ORDER — CYCLOBENZAPRINE HCL 5 MG PO TABS
5.0000 mg | ORAL_TABLET | Freq: Two times a day (BID) | ORAL | Status: DC | PRN
  Administered 2015-10-30: 5 mg via ORAL
  Filled 2015-10-30: qty 1

## 2015-10-30 MED ORDER — POTASSIUM CHLORIDE CRYS ER 20 MEQ PO TBCR
40.0000 meq | EXTENDED_RELEASE_TABLET | Freq: Once | ORAL | Status: AC
  Administered 2015-10-30: 40 meq via ORAL
  Filled 2015-10-30: qty 2

## 2015-10-30 MED ORDER — DIPHENHYDRAMINE HCL 25 MG PO CAPS
25.0000 mg | ORAL_CAPSULE | Freq: Four times a day (QID) | ORAL | Status: DC | PRN
  Administered 2015-10-30 – 2015-10-31 (×2): 25 mg via ORAL
  Filled 2015-10-30 (×2): qty 1

## 2015-10-30 NOTE — Progress Notes (Addendum)
      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 Pulmonary: Slightly diminished at bases. Abdomen: Soft, non tender, bowel sounds present. Extremities: Trace bilateral lower extremity edema. Wounds: Aquacel intact.   Lab Results: CBC: Recent Labs  10/29/15 0415 10/30/15 0540  WBC 6.7 7.2  HGB 12.2* 12.3*  HCT 35.4* 35.2*  PLT 95* 96*   BMET:  Recent Labs  10/29/15 0415 10/30/15 0540  NA 128* 129*  K 3.5 3.3*  CL 92* 89*  CO2 30 34*  GLUCOSE 127* 104*  BUN 8 8  CREATININE 0.81 0.78  CALCIUM 8.0* 8.5*    PT/INR: No results for input(s): LABPROT, INR in the last 72 hours. ABG:  INR: Will add last result for INR, ABG once components are confirmed Will add last 4 CBG results once components are confirmed  Assessment/Plan:  1. CV - SR in the 70's. On Norvasc 5 mg daily and Toprol XL 50 mg daily as taken pre op. 2.  Pulmonary - On 4 liters of oxygen via [place]. Will wean as tolerates. CXR this am shows no pneumothorax, consolidation left base (effusion/atelectasis?). Encourage incentive spirometer and flutter valve. Pathology results pending.  3. Mild anemia-H and H stable at 12.3 and 35.2 4. Thrombocytopenia-platelets 96,000 5. Supplement  potassium 6. Decrease IVF 7. Stop PCA 8. Flexeril PRN 9. Hyponatremia-sodium stable at 129 10. Possibly home in am   ZIMMERMAN,DONIELLE MPA-C 10/30/2015,8:16 AM   No path back yet Work on pulmonary  And ambulation Transition off pca to oral pain meds  I have seen and examined Johnny Mccoy and agree with the above assessment  and plan.  [name] MD Beeper [phone] Office [phone] 10/30/2015 9:01 AM

## 2015-10-30 NOTE — Discharge Instructions (Signed)
Post Operative Instructions:  1. May shower, may not swim or take a tub bath 2. No driving for at least 2 weeks, and must be off all narcotic pain medication 3. Diet- resume previous home diet 4. Fever- monitor for fever >101.5 please call the office at [phone] 5. Sternal precautions- no lifting, pushing, pulling more than 8 lbs for 6 weeks 6. Activity as tolerated... Please ambulate at least 3x per day

## 2015-10-30 NOTE — Progress Notes (Addendum)
[address]       [place] [address]             [phone]      2 Days Post-Op Procedure(s) (LRB): RESECTION OF anterier MEDIASTINAL TUMOR (N/A) PARTIAL STERNOTOMY (N/A) Subjective: Asked by nurse to see patient for tremors in legs. Also sweating and low grade fever  Objective: Vital signs in last 24 hours: Temp:  [97.5 F (36.4 C)-99.5 F (37.5 C)] 99.5 F (37.5 C) (08/30 1612) Pulse Rate:  [65-89] 89 (08/30 1646) Cardiac Rhythm: Normal sinus rhythm (08/30 1500) Resp:  [13-24] 20 (08/30 1646) BP: (109-153)/(59-76) 135/67 (08/30 1646) SpO2:  [91 %-96 %] 92 % (08/30 1500)  Hemodynamic parameters for last 24 hours:    Intake/Output from previous day: 08/29 0701 - 08/30 0700 In: 1998.3 [P.O.:1340; I.V.:608.3; IV Piggyback:50] Out: [id number] [Urine:1375] Intake/Output this shift: Total I/O In: 360 [P.O.:360] Out: 950 [Urine:950]  General appearance: alert, cooperative and no distress Neurologic: completely intact except EOM's Heart: regular rate and rhythm and soft systolic murmur Lungs: clear to auscultation bilaterally Extremities: no edema  Lab Results:  Recent Labs  10/29/15 0415 10/30/15 0540  WBC 6.7 7.2  HGB 12.2* 12.3*  HCT 35.4* 35.2*  PLT 95* 96*   BMET:  Recent Labs  10/29/15 0415 10/30/15 0540  NA 128* 129*  K 3.5 3.3*  CL 92* 89*  CO2 30 34*  GLUCOSE 127* 104*  BUN 8 8  CREATININE 0.81 0.78  CALCIUM 8.0* 8.5*    PT/INR: No results for input(s): LABPROT, INR in the last 72 hours. ABG    Component Value Date/Time   PHART 7.410 10/29/2015 0425   HCO3 32.6 (H) 10/29/2015 0425   TCO2 34 10/29/2015 0425   O2SAT 94.0 10/29/2015 0425   CBG (last 3)   Recent Labs  10/28/15 1548 10/29/15 0811  GLUCAP 142* 108*    Meds Scheduled Meds: . acetaminophen  1,000 mg Oral Q6H   Or  . acetaminophen (TYLENOL) oral liquid 160 mg/5 mL  1,000 mg Oral Q6H  . amLODipine  5 mg Oral Daily  . aspirin EC  81 mg Oral Daily    . atorvastatin  80 mg Oral q1800  . bisacodyl  10 mg Oral Daily  . enoxaparin (LOVENOX) injection  40 mg Subcutaneous Q24H  . famotidine  40 mg Oral QHS  . fluticasone furoate-vilanterol  1 puff Inhalation Daily  . hydrochlorothiazide  25 mg Oral Daily  . lamoTRIgine  100 mg Oral Daily  . loratadine  10 mg Oral Daily  . mouth rinse  15 mL Mouth Rinse BID  . memantine  28 mg Oral Daily  . metoprolol succinate  50 mg Oral Daily  . senna-docusate  1 tablet Oral QHS  . venlafaxine XR  150 mg Oral Q breakfast   Continuous Infusions: . dextrose 5 % and 0.9% NaCl 10 mL/hr at 10/30/15 1000   PRN Meds:.albuterol, cyclobenzaprine, diphenhydrAMINE, nitroGLYCERIN, ondansetron (ZOFRAN) IV, oxyCODONE, potassium chloride, traZODone  Xrays Dg Chest Port 1 View  Result Date: 10/30/2015 CLINICAL DATA:  Chest pain EXAM: PORTABLE CHEST 1 VIEW COMPARISON:  October 29, 2015 FINDINGS: Chest tube has been removed. Central catheter tip is in the superior vena cava near the cavoatrial junction. No pneumothorax. There is persistent consolidation in the medial left base. The lungs elsewhere are clear. Heart is borderline enlarged with pulmonary vascularity within normal limits. No adenopathy. Patient is status post median sternotomy. IMPRESSION: No pneumothorax.  Persistent consolidation medial left base. No new opacity. Stable cardiac silhouette. Electronically Signed   By: Lowella Grip III M.D.   On: 10/30/2015 07:41   Dg Chest Port 1 View  Result Date: 10/29/2015 CLINICAL DATA:  Chest tube placement.  CABG. EXAM: PORTABLE CHEST 1 VIEW COMPARISON:  10/28/2015. FINDINGS: Chest tube over the mid chest is unchanged in position. Right IJ line in stable position. No pneumothorax. Prior median sternotomy. Stable cardiomegaly. Unchanged bibasilar atelectasis and/or infiltrates. Small left pleural effusion. No pneumothorax. IMPRESSION: 1. Chest tube over the mid chest is unchanged in position. Right IJ line stable  position. No pneumothorax. 2.  Prior median sternotomy.  Stable cardiomegaly. 3. Persistent bibasilar atelectasis and/or infiltrates without interim change. Small left pleural effusion. 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 He is on multiple neuro meds. Will stop ultram. IF recurs may need neurology involvement/poss EEG   LOS: 2 days    Mccoy,Johnny E 10/30/2015  Patient seen, notes he had "rubber legs" while walking, no loc. He has had similar event after surgery in [place]. Now alert and awake neuro intact eating chips and milk shake  I have seen and examined Johnny Mccoy and agree with the above assessment  and plan.  [name] MD Beeper [phone] Office [phone] 10/30/2015 6:38 PM

## 2015-10-30 NOTE — Progress Notes (Signed)
Patients vitals  Temp 99.5 HR 89 bp 135/67 o2- 96 on 2L nasal cannula  Patient is alert and oriented, able to follow commands.  Legs tremors are intermittent and now patient is complaining of "floaters" in both of his eyes. Magnesium and BMET drawn from central line.  Notified Dr. Newell Coral. Johnny Mccoy is now at bedside. Will continue to monitor.

## 2015-10-30 NOTE — Progress Notes (Signed)
Patient ambulated in the hall and tolerated well. Patient then went to bathroom to void. When returning to bed patient started to buckle and legs were jumping. Got patient into bed and his legs were both spasming. Rapid Response notified. Patient now resting with smaller leg movements. Notifying MD, will continue to monitor.

## 2015-10-30 NOTE — Discharge Summary (Signed)
Physician Discharge Summary  Patient ID: Johnny Mccoy MRN: [id number] DOB/AGE: 69-01-1946 69 y.o.  Admit date: 10/28/2015 Discharge date: 10/31/2015  Admission Diagnoses:  Patient Active Problem List   Diagnosis Date Noted  . 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 Anxiety 05/23/2015  . Acid reflux 05/23/2015  . HBV (hepatitis B virus) infection 05/23/2015  . History of cardiac catheterization 05/23/2015  . Combined fat and carbohydrate induced hyperlipemia 05/23/2015  . Arteriosclerosis of coronary artery 05/23/2015  . Clinical depression 05/23/2015  . Cough 07/26/2014  . Benign essential HTN 07/16/2014  . Breathlessness on exertion 02/12/2014  . Ischemic embolic stroke ([place]) 123456  . Bundle branch block, right 05/29/2012  . History of knee surgery 05/26/2012   Discharge Diagnoses:   Patient Active Problem List   Diagnosis Date Noted  . 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 Anxiety 05/23/2015  . Acid reflux 05/23/2015  . HBV (hepatitis B virus) infection 05/23/2015  . History of cardiac catheterization 05/23/2015  . Combined fat and carbohydrate induced hyperlipemia 05/23/2015  . Arteriosclerosis of coronary artery 05/23/2015  . Clinical depression 05/23/2015  . Cough 07/26/2014  . Benign essential HTN 07/16/2014  . Breathlessness on exertion 02/12/2014  . Ischemic embolic stroke ([place]) 123456  . Bundle branch block, right 05/29/2012  . History of knee surgery 05/26/2012   Discharged Condition: good  History of Present Illness:  Johnny Mccoy is a 70 yo male referred to [facility] for a calcified anterior mediastinal mass.  He has a long standing history of coronary occlusive disease.  He does not smoke.  He recently traveled to [place] and since return had experienced several episodes of bronchitis.  CT of the chest showed an anterior mediastinal mass with calcium.  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  Subsequent PET CT scan was shown the mass to be non-avid.  It was felt he should  undergo surgical resection.  He was evaluated by Dr. Servando Snare who was in agreement.  The risks and benefits of the procedure were explained to the patient and he was agreeable to proceed.  Hospital Course:   Johnny Mccoy presented to [facility] on 10/28/2015.  He underwent partial sternotomy with resection of anterior mediastinal mass.  He tolerated the procedure without difficulty and was taken to the SICU in stable condition.  The patient had mild confusion post operatively.  His chest tube output was minimal.  This was able to be removed on POD #1. He was also felt to be medically stable for transfer to the stepdown unit.  He required aggressive pulmonary toilet.  He was successfully weaned off oxygen. He had a brief episode of "tremors" in his legs.  This occurred one time and per patient was similar to an episode he experienced after surgery several years ago.  His BMET and LFTs were normal.  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 He was ambulating independently.  His pain is controlled with oral medications.  He is felt medically stable for discharge home today.      Significant Diagnostic Studies: nuclear medicine:   1. Non hypermetabolic partially calcified 4.6 cm irregular anterior mediastinal mass. 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 2. Hypermetabolic subcentimeter right paratracheal and bilateral hilar lymph nodes, nonspecific, metastatic disease cannot be excluded. 3. Solitary 3 mm right lower lobe pulmonary nodule, below PET resolution. 4. No hypermetabolic extrathoracic disease. 5. Additional findings include coronary atherosclerosis, diffuse hepatic steatosis,  mild prostatomegaly and small bilateral fat containing inguinal hernias.  Treatments: surgery:   Resection of anterior mediastinal tumor through partial sternotomy.  Disposition: Home  Discharge medications:      Medication List    STOP taking these medications   HYDROcodone-acetaminophen 5-325 MG tablet Commonly known as:  NORCO/VICODIN     TAKE these medications   acetaminophen 500 MG tablet Commonly known as:  TYLENOL Take 2 tablets (1,000 mg total) by mouth every 6 (six) hours as needed for mild pain or fever.   amLODipine 5 MG tablet Commonly known as:  NORVASC Take 5 mg by mouth daily.   aspirin EC 81 MG tablet Take 81 mg by mouth daily.   atorvastatin 80 MG tablet Commonly known as:  LIPITOR Take 80 mg by mouth daily at 6 PM.   BREO ELLIPTA 100-25 MCG/INH Aepb Generic drug:  fluticasone furoate-vilanterol Inhale 1 puff into the lungs daily.   cetirizine 10 MG tablet Commonly known as:  ZYRTEC Take 10 mg by mouth daily.   cyclobenzaprine 10 MG tablet Commonly known as:  FLEXERIL Take 10 mg by mouth 3 (three) times daily as needed for muscle spasms.   DEXILANT 60 MG capsule Generic drug:  dexlansoprazole Take 60 mg by mouth daily.   FLOVENT HFA 220 MCG/ACT inhaler Generic drug:  fluticasone INHALE 1 PUFF INTO THE LUNGS TWICE A DAY   hydrochlorothiazide 25 MG tablet Commonly known as:  HYDRODIURIL Take by mouth.   lamoTRIgine 100 MG tablet Commonly known as:  LAMICTAL 100 mg daily.   LORazepam 0.5 MG tablet Commonly known as:  ATIVAN Take 0.5 mg by mouth 2 (two) times daily.   metoprolol succinate 50 MG 24 hr tablet Commonly known as:  TOPROL-XL TAKE 1 TABLET (50 MG TOTAL) BY MOUTH ONCE DAILY.   NAMENDA XR 28 MG Cp24 24 hr capsule Generic drug:  memantine Take 28 mg by mouth daily.   NITROLINGUAL 0.4 MG/SPRAY spray Generic drug:  nitroGLYCERIN Place 2 sprays under the tongue every 5 (five) minutes x 3 doses as needed.   oxyCODONE 5 MG immediate release tablet Commonly known as:  Oxy IR/ROXICODONE Take 1-2 tablets (5-10 mg total) by mouth every 4 (four) hours as needed for severe pain.   PRISTIQ 100 MG 24 hr tablet Generic drug:   desvenlafaxine Take 100 mg by mouth daily.   PROAIR HFA 108 (90 Base) MCG/ACT inhaler Generic drug:  albuterol Inhale 1-2 puffs into the lungs every 6 (six) hours as needed for wheezing or shortness of breath.   ranitidine 150 MG tablet Commonly known as:  ZANTAC Take 150 mg by mouth at bedtime.   traZODone 100 MG tablet Commonly known as:  DESYREL Take 100 mg by mouth at bedtime as needed.      Follow-up Information    [name], MD Follow up on 11/14/2015.   Specialty:  Cardiothoracic Surgery Why:  Appointment is at 12:00, Please get CXR at 11:30 at [place] located on the first floor of our office building Contact information: [place] [address] [phone]           Signed: Ellwood Handler 10/31/2015, 8:35 AM

## 2015-10-31 LAB — HEPATIC FUNCTION PANEL
ALT: 22 U/L (ref 17–63)
AST: 24 U/L (ref 15–41)
Albumin: 3.2 g/dL — ABNORMAL LOW (ref 3.5–5.0)
Alkaline Phosphatase: 48 U/L (ref 38–126)
Bilirubin, Direct: 0.2 mg/dL (ref 0.1–0.5)
Indirect Bilirubin: 0.6 mg/dL (ref 0.3–0.9)
Total Bilirubin: 0.8 mg/dL (ref 0.3–1.2)
Total Protein: 5.7 g/dL — ABNORMAL LOW (ref 6.5–8.1)

## 2015-10-31 MED ORDER — OXYCODONE HCL 5 MG PO TABS: 5.0000 mg | ORAL_TABLET | ORAL | 0 refills | Status: DC | PRN

## 2015-10-31 MED ORDER — ACETAMINOPHEN 500 MG PO TABS: 1000.0000 mg | ORAL_TABLET | Freq: Four times a day (QID) | ORAL | 0 refills | Status: DC | PRN

## 2015-10-31 NOTE — Care Management Important Message (Signed)
Important Message  Patient Details  Name: Johnny Mccoy MRN: [id number] Date of Birth: [birth year]   Medicare Important Message Given:  Yes    Nathen May 10/31/2015, 10:52 AM

## 2015-10-31 NOTE — Progress Notes (Addendum)
      [facility]       [place] [address]             [phone]      3 Days Post-Op Procedure(s) (LRB): RESECTION OF anterier MEDIASTINAL TUMOR (N/A) PARTIAL STERNOTOMY (N/A)   Subjective:  Johnny Mccoy is feeling good.  He has had no further episodes since his leg tremors yesterday.  He has had a few episodes of "flushing" where he feels hot and sweaty.  He wants to go home.  Objective: Vital signs in last 24 hours: Temp:  [97.9 F (36.6 C)-99.5 F (37.5 C)] 97.9 F (36.6 C) (08/31 0700) Pulse Rate:  [65-89] 67 (08/31 0700) Cardiac Rhythm: Normal sinus rhythm;Bundle branch block (08/30 1945) Resp:  [9-24] 9 (08/31 0700) BP: (109-153)/(60-83) 119/83 (08/31 0700) SpO2:  [90 %-95 %] 93 % (08/31 0700) Weight:  [204 lb (92.5 kg)] 204 lb (92.5 kg) (08/30 2240)  Intake/Output from previous day: 08/30 0701 - 08/31 0700 In: 360 [P.O.:360] Out: 950 [Urine:950]  General appearance: alert, cooperative and no distress Heart: regular rate and rhythm Lungs: clear to auscultation bilaterally Abdomen: soft, non-tender; bowel sounds normal; no masses,  no organomegaly Extremities: extremities normal, atraumatic, no cyanosis or edema Wound: clean and dry  Lab Results:  Recent Labs  10/29/15 0415 10/30/15 0540  WBC 6.7 7.2  HGB 12.2* 12.3*  HCT 35.4* 35.2*  PLT 95* 96*   BMET:  Recent Labs  10/30/15 0540 10/30/15 1703  NA 129* 129*  K 3.3* 3.5  CL 89* 87*  CO2 34* 33*  GLUCOSE 104* 95  BUN 8 8  CREATININE 0.78 0.93  CALCIUM 8.5* 8.9    PT/INR: No results for input(s): LABPROT, INR in the last 72 hours. ABG    Component Value Date/Time   PHART 7.410 10/29/2015 0425   HCO3 32.6 (H) 10/29/2015 0425   TCO2 34 10/29/2015 0425   O2SAT 94.0 10/29/2015 0425   CBG (last 3)   Recent Labs  10/28/15 1548 10/29/15 0811  GLUCAP 142* 108*    Assessment/Plan: S/P Procedure(s) (LRB): RESECTION OF anterier MEDIASTINAL TUMOR (N/A) PARTIAL  STERNOTOMY (N/A)  1. CV- NSR, continue home Lopressor, Norvasc 2. Pulm- off oxygen, no acute issues, encouraged continued use of IS at discharge 3. Neuro- H/O Petit Mal seizures, no further incidences overnight, Lamictal level pending... However patient says this is similar to the reaction he had post op after his knee surgery.  He is not concerned with these symptoms  4. GI- LFTs WNL 5. Dispo- patient stable, wants to go home today.... Will plan discharge if okay with Dr. Servando Mccoy   LOS: 3 days    BARRETT, Johnny Mccoy 10/31/2015  Off o2, ambulating well , no episodes of weakness, plan d/c today Path still pending  I have seen and examined Johnny Mccoy and agree with the above assessment  and plan.  Johnny Isaac MD Beeper [phone] Office [phone] 10/31/2015 8:33 AM

## 2015-10-31 NOTE — Progress Notes (Signed)
Patient discharged with family members to home. Discharge instructions, medications provided. Patient verbalized understanding. Patient transported in wheelchair.   Johnny Mccoy 10/31/2015 10:59 AM

## 2015-11-01 LAB — LAMOTRIGINE LEVEL: Lamotrigine Lvl: NOT DETECTED ug/mL (ref 2.0–20.0)

## 2015-11-05 DIAGNOSIS — R209 Unspecified disturbances of skin sensation: Secondary | ICD-10-CM | POA: Insufficient documentation

## 2015-11-05 DIAGNOSIS — G8194 Hemiplegia, unspecified affecting left nondominant side: Secondary | ICD-10-CM | POA: Insufficient documentation

## 2015-11-13 ENCOUNTER — Other Ambulatory Visit: Payer: Self-pay | Admitting: Cardiothoracic Surgery

## 2015-11-13 DIAGNOSIS — J9859 Other diseases of mediastinum, not elsewhere classified: Secondary | ICD-10-CM

## 2015-11-14 ENCOUNTER — Ambulatory Visit (INDEPENDENT_AMBULATORY_CARE_PROVIDER_SITE_OTHER): Payer: Self-pay | Admitting: Cardiothoracic Surgery

## 2015-11-14 ENCOUNTER — Encounter: Payer: Self-pay | Admitting: Cardiothoracic Surgery

## 2015-11-14 ENCOUNTER — Ambulatory Visit
Admission: RE | Admit: 2015-11-14 | Discharge: 2015-11-14 | Disposition: A | Discharge status: Home / Self Care | Payer: Medicare Other | Source: Ambulatory Visit | Attending: Cardiothoracic Surgery | Admitting: Cardiothoracic Surgery

## 2015-11-14 VITALS — BP 124/75 | HR 71 | Resp 16 | Ht 67.0 in | Wt 200.0 lb

## 2015-11-14 DIAGNOSIS — J9859 Other diseases of mediastinum, not elsewhere classified: Secondary | ICD-10-CM

## 2015-11-14 DIAGNOSIS — Z09 Encounter for follow-up examination after completed treatment for conditions other than malignant neoplasm: Secondary | ICD-10-CM

## 2015-11-14 NOTE — Progress Notes (Addendum)
[facility] [address]       [place], [address]             [phone]      Johnny Mccoy [place] Medical Record [id number] Date of Birth: 1945-10-14  Referring: Nestor Lewandowsky, MD Primary Care: Idelle Crouch, MD  Chief Complaint:   POST OP FOLLOW UP 10/28/2015 OPERATIVE REPORT PREOPERATIVE DIAGNOSIS:  Anterior mediastinal mass. 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 PROCEDURE PERFORMED:  Resection of anterior mediastinal tumor through partial sternotomy. SURGEON:  Lanelle Bal, MD.  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 At the time of surgery the mass was well encapsulated and resected without evidence of invasion of surrounding tissue. Postoperatively the patient has noted some droopy eyelid numbness in his left leg. He noted one episode of feeling like he was going to move to the left. He has a distant history of stroke many years ago. He did see a neurologist last week Dr. Christian Mate, who has arranged an MRI of the brain and conduction studies of his left lower extremity. While he was hospitalized he had a Lamictal level drawn which was low.  Patient is now concerned that he may have myasthenia, but will discuss this with neurologist.     Past Medical History:  Diagnosis Date  . Anxiety    takes Lorazepam daily  . Arthritis   . Asthma    Albuterol as needed.Breo daily as well as Flovent  . CAD (coronary artery disease)   . Cataract   . Cataracts, bilateral    immature  . Chronic back pain    compression  . Complication of anesthesia    seizures after partial knee in 2014.Surgery was in am and an still in post op until 9 that night. Was sent to [facility] for 5 days d/t uncontrollable shaking,incoherant  . Depressed    takes Pristiq daily    . Essential tremor   . GERD (gastroesophageal reflux disease)    takes Dexilant daily as well as Zantac  . 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 History of bronchitis    Jan-Mar 2017  . History of hiatal hernia   . Hyperlipidemia    takes Atorvastatin daily  . Hypertension    takes HCTZ,Amlodipine,and Metoprolol daily  . Insomnia    takes Trazodone nightly   . Joint pain   . Muscle spasm    in neck.Takes Flexeril as needed  . Numbness    right knee,right hand,and toes on left  . Seizures ([place])    takes Lamotrigine daily. Last seizure 6+ yrs ago     History  Smoking Status  . Never Smoker  Smokeless Tobacco  . Never Used    History  Alcohol Use  . Yes    Comment: wine and couple shots of liquor each evening     Allergies  Allergen Reactions  . No Allergies On File     Current Outpatient Prescriptions  Medication Sig Dispense Refill  . acetaminophen (TYLENOL) 500 MG tablet Take 2 tablets (1,000 mg total) by mouth every 6 (six) hours as needed for mild pain or fever. 30 tablet 0  . albuterol (PROAIR HFA) 108 (90 Base) MCG/ACT inhaler Inhale 1-2 puffs into the lungs every 6 (six)  hours as needed for wheezing or shortness of breath.     Marland Kitchen amLODipine (NORVASC) 5 MG tablet Take 5 mg by mouth daily.     Marland Kitchen aspirin EC 81 MG tablet Take 81 mg by mouth daily.     Marland Kitchen atorvastatin (LIPITOR) 80 MG tablet Take 80 mg by mouth daily at 6 PM.     . BREO ELLIPTA 100-25 MCG/INH AEPB Inhale 1 puff into the lungs daily.     . cetirizine (ZYRTEC) 10 MG tablet Take 10 mg by mouth daily.     Marland Kitchen dexlansoprazole (DEXILANT) 60 MG capsule Take 60 mg by mouth daily.     Marland Kitchen FLOVENT HFA 220 MCG/ACT inhaler INHALE 1 PUFF INTO THE LUNGS TWICE A DAY  12  . hydrochlorothiazide (HYDRODIURIL) 25 MG tablet Take by mouth.    . lamoTRIgine (LAMICTAL) 100 MG tablet 100 mg daily.     Marland Kitchen LORazepam (ATIVAN) 0.5 MG tablet Take 0.5 mg by mouth 2 (two) times daily.     . metoprolol succinate (TOPROL-XL) 50  MG 24 hr tablet TAKE 1 TABLET (50 MG TOTAL) BY MOUTH ONCE DAILY.  3  . NAMENDA XR 28 MG CP24 24 hr capsule Take 28 mg by mouth daily.     . nitroGLYCERIN (NITROLINGUAL) 0.4 MG/SPRAY spray Place 2 sprays under the tongue every 5 (five) minutes x 3 doses as needed.     Marland Kitchen PRISTIQ 100 MG 24 hr tablet Take 100 mg by mouth daily.     . ranitidine (ZANTAC) 150 MG tablet Take 150 mg by mouth at bedtime.    . traZODone (DESYREL) 100 MG tablet Take 100 mg by mouth at bedtime as needed.      No current facility-administered medications for this visit.        Physical Exam: BP 124/75   Pulse 71   Resp 16   Ht 5\' 7"  (1.702 m)   Wt 200 lb (90.7 kg)   SpO2 96% Comment: RA  BMI 31.32 kg/m   General appearance: alert, cooperative and appears stated age Neurologic: intact Heart: regular rate and rhythm, S1, S2 normal, no murmur, click, rub or gallop Lungs: clear to auscultation bilaterally Abdomen: soft, non-tender; bowel sounds normal; no masses,  no organomegaly Extremities: extremities normal, atraumatic, no cyanosis or edema and Homans sign is negative, no sign of DVT Wound: Upper sternal incision is well-healed and stable   Diagnostic Studies & Laboratory data:     Recent Radiology Findings:   Dg Chest 2 View  Result Date: 11/14/2015 CLINICAL DATA:  Resection of mediastinal mass on October 28, 2015. The patient reports persistent chest tenderness but no other complaints. History of asthma, coronary artery disease, hypertension, nonsmoker. EXAM: CHEST  2 VIEW COMPARISON:  Portable chest x-ray of October 30, 2015 FINDINGS: The lungs are adequately inflated and clear. There is no pneumothorax, pneumomediastinum, or pleural effusion. The heart and pulmonary vascularity are normal. There are 4 sternal wires present which appear intact. The retrosternal soft tissues are normal. There is faint calcification in the wall of the thoracic aorta. The bony thorax exhibits no acute abnormality. IMPRESSION:  There is no active cardiopulmonary disease. No postsurgical complication is observed. Electronically Signed   By: David  Martinique M.D.   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 - SEE COMMENT. Microscopic Comment Sectioning reveals a relatively well circumscribed and calcified lesion, consistent with thymoma. There is extracapsular extension as well as extra-thymic extension. Cytologically, the lesion is composed of both epithelioid cells and lymphoid cells, as highlighted by stains for CD20, CD45, cytokeratin 7, cytokeratin 8/18, CD5, cytokeratin 903 and cytokeratin AE1/AE3. The TTF-1 stain is essentially negative. (JBK:kh 10-31-15) Enid Cutter MD Pathologist, Electronic Signature (Case signed 10/31/2015)   Assessment / Plan:     Patient progressing satisfactorily after recent partial sternotomy and resection of thymoma, at the time of resection the mass was not characterized by invasion into surrounding tissue and was well circumscribed, characteristics of a benign thymoma.   The patient has had some neurologic complaints is and is undergoing workup by neurology, with a surgical procedure done he could undergo MRI without influenza on his recent surgery.   I will present his case to the multidisciplinary thoracic oncology conference, to reach a consensus at this point not do not think there is a significant need for mediastinal radiation.  Plan to see the patient back in 3 months, and get a CT scan of the chest 6 months postop, This will evaluate his  postoperative status from resection of the thymoma and evaluate  the nonspecific right lower lobe 3 mm pulmonary nodule Noted on previous CT scans  [name] MD      [address] [place] [address] Office [phone]   Beeper [phone]  11/14/2015 11:54 AM

## 2015-11-15 ENCOUNTER — Encounter (INDEPENDENT_AMBULATORY_CARE_PROVIDER_SITE_OTHER): Payer: Self-pay

## 2015-12-09 DIAGNOSIS — G5602 Carpal tunnel syndrome, left upper limb: Secondary | ICD-10-CM | POA: Insufficient documentation

## 2015-12-09 DIAGNOSIS — G5600 Carpal tunnel syndrome, unspecified upper limb: Secondary | ICD-10-CM | POA: Insufficient documentation

## 2015-12-10 DIAGNOSIS — R531 Weakness: Secondary | ICD-10-CM | POA: Insufficient documentation

## 2015-12-16 ENCOUNTER — Ambulatory Visit (INDEPENDENT_AMBULATORY_CARE_PROVIDER_SITE_OTHER): Payer: Medicare Other | Admitting: Vascular Surgery

## 2015-12-16 ENCOUNTER — Encounter (INDEPENDENT_AMBULATORY_CARE_PROVIDER_SITE_OTHER): Payer: Self-pay | Admitting: Vascular Surgery

## 2015-12-16 VITALS — BP 136/83 | HR 80 | Resp 16 | Ht 67.0 in | Wt 209.0 lb

## 2015-12-16 DIAGNOSIS — D4989 Neoplasm of unspecified behavior of other specified sites: Secondary | ICD-10-CM

## 2015-12-16 DIAGNOSIS — D150 Benign neoplasm of thymus: Secondary | ICD-10-CM

## 2015-12-16 DIAGNOSIS — I890 Lymphedema, not elsewhere classified: Secondary | ICD-10-CM | POA: Diagnosis not present

## 2015-12-16 DIAGNOSIS — I10 Essential (primary) hypertension: Secondary | ICD-10-CM | POA: Diagnosis not present

## 2015-12-16 DIAGNOSIS — I2510 Atherosclerotic heart disease of native coronary artery without angina pectoris: Secondary | ICD-10-CM

## 2015-12-16 DIAGNOSIS — M7989 Other specified soft tissue disorders: Secondary | ICD-10-CM | POA: Insufficient documentation

## 2015-12-16 DIAGNOSIS — I872 Venous insufficiency (chronic) (peripheral): Secondary | ICD-10-CM

## 2015-12-16 NOTE — Progress Notes (Signed)
MRN : [id number]  KRESTON PEARL is a 70 y.o. (1945-12-20) male who presents with chief complaint of  Chief Complaint  Patient presents with  . Follow-up  .  History of Present Illness: The patient returns to the office for followup evaluation regarding leg swelling.  The swelling has improved quite a bit and the pain associated with swelling has decreased substantially. There have not been any interval development of a ulcerations or wounds.  Since the previous visit the patient has been wearing graduated compression stockings and has noted little significant improvement in the lymphedema. The patient has been using compression routinely morning until night.  The patient also states elevation during the day and exercise is being done too.   Current Outpatient Prescriptions  Medication Sig Dispense Refill  . albuterol (PROAIR HFA) 108 (90 Base) MCG/ACT inhaler Inhale 1-2 puffs into the lungs every 6 (six) hours as needed for wheezing or shortness of breath.     Marland Kitchen amLODipine (NORVASC) 5 MG tablet Take 5 mg by mouth daily.     Marland Kitchen aspirin EC 81 MG tablet Take 81 mg by mouth daily.     Marland Kitchen BREO ELLIPTA 100-25 MCG/INH AEPB Inhale 1 puff into the lungs daily.     . cetirizine (ZYRTEC) 10 MG tablet Take 10 mg by mouth daily.     Marland Kitchen dexlansoprazole (DEXILANT) 60 MG capsule Take 60 mg by mouth daily.     Marland Kitchen FLOVENT HFA 220 MCG/ACT inhaler INHALE 1 PUFF INTO THE LUNGS TWICE A DAY  12  . hydrochlorothiazide (HYDRODIURIL) 25 MG tablet Take by mouth.    . lamoTRIgine (LAMICTAL) 100 MG tablet 100 mg daily.     Marland Kitchen LORazepam (ATIVAN) 0.5 MG tablet Take 0.5 mg by mouth 2 (two) times daily.     . metoprolol succinate (TOPROL-XL) 50 MG 24 hr tablet TAKE 1 TABLET (50 MG TOTAL) BY MOUTH ONCE DAILY.  3  . NAMENDA XR 28 MG CP24 24 hr capsule Take 28 mg by mouth daily.     . nitroGLYCERIN (NITROLINGUAL) 0.4 MG/SPRAY spray Place 2 sprays under the tongue every 5 (five) minutes x 3 doses as needed.     Marland Kitchen PRISTIQ  100 MG 24 hr tablet Take 100 mg by mouth daily.     . ranitidine (ZANTAC) 150 MG tablet Take 150 mg by mouth at bedtime.    . traZODone (DESYREL) 100 MG tablet Take 100 mg by mouth at bedtime as needed.     Marland Kitchen acetaminophen (TYLENOL) 500 MG tablet Take 2 tablets (1,000 mg total) by mouth every 6 (six) hours as needed for mild pain or fever. (Patient not taking: Reported on 12/16/2015) 30 tablet 0  . atorvastatin (LIPITOR) 80 MG tablet Take 80 mg by mouth daily at 6 PM.     . cyclobenzaprine (FLEXERIL) 10 MG tablet     . oxyCODONE (OXY IR/ROXICODONE) 5 MG immediate release tablet      No current facility-administered medications for this visit.     Past Medical History:  Diagnosis Date  . Anxiety    takes Lorazepam daily  . Arthritis   . Asthma    Albuterol as needed.Breo daily as well as Flovent  . CAD (coronary artery disease)   . Cataract   . Cataracts, bilateral    immature  . Chronic back pain    compression  . Complication of anesthesia    seizures after partial knee in 2014.Surgery was in am and an still  in post op until 9 that night. Was sent to [facility] for 5 days d/t uncontrollable shaking,incoherant  . Depressed    takes Pristiq daily  . Essential tremor   . GERD (gastroesophageal reflux disease)    takes Dexilant daily as well as Zantac  . 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 History of bronchitis    Jan-Mar 2017  . History of hiatal hernia   . Hyperlipidemia    takes Atorvastatin daily  . Hypertension    takes HCTZ,Amlodipine,and Metoprolol daily  . Insomnia    takes Trazodone nightly   . Joint pain   . Muscle spasm    in neck.Takes Flexeril as needed  . Numbness    right knee,right hand,and toes on left  . Seizures ([place])    takes Lamotrigine daily. Last seizure 6+ yrs ago    Past Surgical History:  Procedure Laterality Date  . CARDIAC CATHETERIZATION     2015  . CARPAL TUNNEL RELEASE    . COLONOSCOPY    . CORONARY ANGIOPLASTY WITH STENT PLACEMENT      x 3  . dental implants    . ESOPHAGOGASTRODUODENOSCOPY    . EYE SURGERY Bilateral   . EYE SURGERY     both eyes in 60's d/t muscle causing to squint  . HERNIA REPAIR     umbilical  . JOINT REPLACEMENT Right    knee  . KNEE ARTHROSCOPY    . MEDIAL PARTIAL KNEE REPLACEMENT Right   . MEDIASTERNOTOMY N/A 10/28/2015   Procedure: PARTIAL STERNOTOMY;  Surgeon: [name], MD;  Location: [facility];  Service: Thoracic;  Laterality: N/A;  . numerous hand surgery    . 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   Procedure: RESECTION OF anterier MEDIASTINAL TUMOR;  Surgeon: [name], MD;  Location: [facility];  Service: Thoracic;  Laterality: N/A;  . right hand fusion    . 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 TONSILLECTOMY      Social History Social History  Substance Use Topics  . Smoking status: Never Smoker  . Smokeless tobacco: Never Used  . Alcohol use Yes     Comment: wine and couple shots of liquor each evening     Allergies  Allergen Reactions  . No Known Allergies      REVIEW OF SYSTEMS (Negative unless checked)  Constitutional: [] Weight loss  [] Fever  [] Chills Cardiac: [] Chest pain   [] Chest pressure   [] Palpitations   [] Shortness of breath at rest   [] Shortness of breath with exertion. Vascular:  [] Pain in legs with walking   [] Pain in legs at rest    [] Pain in feet at rest    [] History of DVT   [] Phlebitis   [x] Swelling in legs   [] Varicose veins   [] Non-healing ulcers Pulmonary:   [] Uses home oxygen   [] Productive cough   [] Hemoptysis   [] Wheeze  [] COPD   [] Asthma Neurologic:  [] Dizziness  [] Blackouts   [] Seizures   [] History of stroke   [] History of TIA  [] Aphasia   [] Temporary blindness   [] Dysphagia   [] Weakness or numbness in arm   [x] Weakness or numbness in leg Musculoskeletal:  [x] Arthritis   [] Joint swelling   [x] Joint pain   [] Low back pain Hematologic:  [] Easy bruising  [] Easy bleeding   [] Hypercoagulable state   [] Anemic     Gastrointestinal:  [] Blood in stool   [] Vomiting blood  [] Gastroesophageal reflux/heartburn   [] Difficulty  swallowing. Genitourinary:  [] Chronic kidney disease   [] Difficult urination  [] Frequent urination  [] Burning with urination   [] Blood in urine Skin:  [] Rashes   [] Ulcers   Psychological:  [] History of anxiety   []  History of major depression.    Physical Examination  Vitals:   12/16/15 0954  BP: 136/83  Pulse: 80  Resp: 16  Weight: 209 lb (94.8 kg)  Height: 5\' 7"  (1.702 m)   Body mass index is 32.73 kg/m. Gen:  WD/WN, NAD Head: [place]/AT, No temporalis wasting. Ear/Nose/Throat: Hearing grossly intact, nares w/o erythema or drainage, trachea midline Eyes: PERR, EOM appear normal. Sclera non-icteric Neck: Supple, no nuchal rigidity.  No bruit or JVD.  Pulmonary:  Good air movement, equal and clear to auscultation bilaterally.  Cardiac: RRR, normal S1, S2, no Murmurs, rubs or gallops. Vascular:   1-2+ pitting edema mild venous changes Vessel Right Left  Radial Palpable Palpable  Ulnar Palpable Palpable  Brachial Palpable Palpable  Carotid Palpable Palpable  Aorta Not palpable N/A  Femoral Palpable Palpable  Popliteal Palpable Palpable  PT Palpable Palpable  DP Palpable Palpable   Gastrointestinal: soft, non-rigid/non-distended. No guarding.  Musculoskeletal: M/S 5/5 throughout.  No deformity or atrophy. Neurologic: CN 2-12 intact. Pain and light touch intact in extremities.  Speech is fluent. Motor exam as listed above. Psychiatric: Judgment intact, Mood & affect appropriate for pt's clinical situation. Dermatologic: No rashes or ulcers noted.  No signs consistent with cellulitis. Lymphatic:  No cervical lymphadenopathy  CBC Lab Results  Component Value Date   WBC 7.2 10/30/2015   HGB 12.3 (L) 10/30/2015   HCT 35.2 (L) 10/30/2015   MCV 92.6 10/30/2015   PLT 96 (L) 10/30/2015    BMET    Component Value Date/Time   NA 129 (L) 10/30/2015 1703   NA 140  03/02/2014 0420   K 3.5 10/30/2015 1703   K 4.1 03/02/2014 0420   CL 87 (L) 10/30/2015 1703   CL 105 03/02/2014 0420   CO2 33 (H) 10/30/2015 1703   CO2 30 03/02/2014 0420   GLUCOSE 95 10/30/2015 1703   GLUCOSE 105 (H) 03/02/2014 0420   BUN 8 10/30/2015 1703   BUN 11 03/02/2014 0420   CREATININE 0.93 10/30/2015 1703   CREATININE 0.92 03/02/2014 0420   CALCIUM 8.9 10/30/2015 1703   CALCIUM 7.9 (L) 03/02/2014 0420   GFRNONAA >60 10/30/2015 1703   GFRNONAA >60 03/02/2014 0420   GFRNONAA >60 04/10/2013 1711   GFRAA >60 10/30/2015 1703   GFRAA >60 03/02/2014 0420   GFRAA >60 04/10/2013 1711   CrCl cannot be calculated (Patient's most recent lab result is older than the maximum 21 days allowed.).  COAG Lab Results  Component Value Date   INR 0.99 10/23/2015   INR 0.9 03/22/2013    Radiology No results found.    Assessment/Plan 1. Lymphedema  No surgery or intervention at this point in time.    I have reviewed my previous discussion with the patient regarding swelling and why it  causes symptoms.  The patient is doing well with compression and will continue wearing graduated compression stockings class 1 (20-30 mmHg) on a daily basis a prescription was given. The patient will  beginning wearing the stockings first thing in the morning and removing them in the evening. The patient is instructed specifically not to sleep in the stockings.  In addition, behavioral modification including elevation during the day will be initiated.   He has good control with compression and is  not interested in a lymph pump.   Patient should follow-up on a PRN basis  2. Chronic venous insufficiency See note aboe  3. Leg swelling Continue compression  4. Thymoma Plan per endocrine  5. Benign essential HTN Continue medications no changes  6. Arteriosclerosis of coronary artery Nitrates PRN    Hortencia Pilar, MD  12/16/2015 10:29 AM    This note was created with Dragon medical  transcription system.  Any errors from dictation are purely unintentional

## 2016-02-13 ENCOUNTER — Ambulatory Visit
Admission: RE | Admit: 2016-02-13 | Discharge: 2016-02-13 | Disposition: A | Discharge status: Home / Self Care | Payer: Medicare Other | Source: Ambulatory Visit | Attending: Cardiothoracic Surgery | Admitting: Cardiothoracic Surgery

## 2016-02-13 ENCOUNTER — Ambulatory Visit (INDEPENDENT_AMBULATORY_CARE_PROVIDER_SITE_OTHER): Payer: Medicare Other | Admitting: Cardiothoracic Surgery

## 2016-02-13 ENCOUNTER — Other Ambulatory Visit: Payer: Self-pay | Admitting: Cardiothoracic Surgery

## 2016-02-13 ENCOUNTER — Encounter: Payer: Self-pay | Admitting: Cardiothoracic Surgery

## 2016-02-13 VITALS — BP 139/85 | HR 70 | Resp 20 | Ht 67.0 in | Wt 209.0 lb

## 2016-02-13 DIAGNOSIS — I2510 Atherosclerotic heart disease of native coronary artery without angina pectoris: Secondary | ICD-10-CM

## 2016-02-13 DIAGNOSIS — I25119 Atherosclerotic heart disease of native coronary artery with unspecified angina pectoris: Secondary | ICD-10-CM

## 2016-02-13 DIAGNOSIS — J9859 Other diseases of mediastinum, not elsewhere classified: Secondary | ICD-10-CM

## 2016-02-13 DIAGNOSIS — Z09 Encounter for follow-up examination after completed treatment for conditions other than malignant neoplasm: Secondary | ICD-10-CM | POA: Diagnosis not present

## 2016-02-13 NOTE — Progress Notes (Signed)
[address]       [place] [address]             [phone]      Jakori A Holecek [facility] Medical Record [id number] Date of Birth: [birth year]  Referring: Nestor Lewandowsky, MD Primary Care: Idelle Crouch, MD  Chief Complaint:   POST OP FOLLOW UP 10/28/2015 OPERATIVE REPORT PREOPERATIVE DIAGNOSIS:  Anterior mediastinal mass. 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 PROCEDURE PERFORMED:  Resection of anterior mediastinal tumor through partial sternotomy. SURGEON:  Lanelle Bal, MD.  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 At the time of surgery the mass was well encapsulated and resected without evidence of invasion of surrounding tissue. Postoperatively the patient has noted some droopy eyelid numbness in his left leg. He noted one episode of feeling like he was going to move to the left. He has a distant history of stroke many years ago. He did see a neurologist  Dr. Christian Mate, he reports today that his myasthenia workup was negative.   He notes at this point he feels the best he's felt in several years, denies any incisional pain, denies anginal type chest pain.    Past Medical History:  Diagnosis Date  . Anxiety    takes Lorazepam daily  . Arthritis   . Asthma    Albuterol as needed.Breo daily as well as Flovent  . CAD (coronary artery disease)   . Cataract   . Cataracts, bilateral    immature  . Chronic back pain    compression  . Complication of anesthesia    seizures after partial knee in 2014.Surgery was in am and an still in post op until 9 that night. Was sent to [facility] for 5 days d/t uncontrollable shaking,incoherant  . Depressed    takes Pristiq daily  . Essential tremor   . GERD (gastroesophageal reflux disease)     takes Dexilant daily as well as Zantac  . 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 History of bronchitis    Jan-Mar 2017  . History of hiatal hernia   . Hyperlipidemia    takes Atorvastatin daily  . Hypertension    takes HCTZ,Amlodipine,and Metoprolol daily  . Insomnia    takes Trazodone nightly   . Joint pain   . Muscle spasm    in neck.Takes Flexeril as needed  . Numbness    right knee,right hand,and toes on left  . Seizures ([place])    takes Lamotrigine daily. Last seizure 6+ yrs ago     History  Smoking Status  . Never Smoker  Smokeless Tobacco  . Never Used    History  Alcohol Use  . Yes    Comment: wine and couple shots of liquor each evening     Allergies  Allergen Reactions  . No Known Allergies     Current Outpatient Prescriptions  Medication Sig Dispense Refill  . albuterol (PROAIR HFA) 108 (90 Base) MCG/ACT inhaler Inhale 1-2 puffs into the lungs every 6 (six) hours as needed for wheezing or shortness of breath.     Marland Kitchen amLODipine (NORVASC) 5 MG tablet Take 5 mg by mouth daily.     Marland Kitchen aspirin EC 81 MG tablet Take 81 mg by mouth daily.     Marland Kitchen  atorvastatin (LIPITOR) 80 MG tablet Take 80 mg by mouth daily at 6 PM.     . BREO ELLIPTA 100-25 MCG/INH AEPB Inhale 1 puff into the lungs daily.     . cetirizine (ZYRTEC) 10 MG tablet Take 10 mg by mouth daily.     Marland Kitchen dexlansoprazole (DEXILANT) 60 MG capsule Take 60 mg by mouth daily.     Marland Kitchen FLOVENT HFA 220 MCG/ACT inhaler INHALE 1 PUFF INTO THE LUNGS TWICE A DAY  12  . hydrochlorothiazide (HYDRODIURIL) 25 MG tablet Take by mouth.    . lamoTRIgine (LAMICTAL) 100 MG tablet 100 mg daily.     Marland Kitchen LORazepam (ATIVAN) 0.5 MG tablet Take 0.5 mg by mouth 2 (two) times daily.     . metoprolol succinate (TOPROL-XL) 50 MG 24 hr tablet TAKE 1 TABLET (50 MG TOTAL) BY MOUTH ONCE DAILY.  3  . NAMENDA XR 28 MG CP24 24 hr capsule Take 28 mg by mouth daily.     . nitroGLYCERIN (NITROLINGUAL) 0.4 MG/SPRAY spray Place 2 sprays under the  tongue every 5 (five) minutes x 3 doses as needed.     Marland Kitchen oxyCODONE (OXY IR/ROXICODONE) 5 MG immediate release tablet     . PRISTIQ 100 MG 24 hr tablet Take 100 mg by mouth daily.     . ranitidine (ZANTAC) 150 MG tablet Take 150 mg by mouth at bedtime.    . traZODone (DESYREL) 100 MG tablet Take 100 mg by mouth at bedtime as needed.      No current facility-administered medications for this visit.        Physical Exam: BP 139/85   Pulse 70   Resp 20   Ht 5\' 7"  (1.702 m)   Wt 209 lb (94.8 kg)   SpO2 93% Comment: RA  BMI 32.73 kg/m   General appearance: alert, cooperative and appears stated age Neurologic: intact Heart: regular rate and rhythm, S1, S2 normal, no murmur, click, rub or gallop Lungs: clear to auscultation bilaterally Abdomen: soft, non-tender; bowel sounds normal; no masses,  no organomegaly Extremities: extremities normal, atraumatic, no cyanosis or edema and Homans sign is negative, no sign of DVT Wound: Upper sternal incision is well-healed and stable   Diagnostic Studies & Laboratory data:     Recent Radiology Findings:   Dg Chest 2 View  Result Date: 02/13/2016 CLINICAL DATA:  Coronary artery disease, resection of mediastinal mass in August of 2017 EXAM: CHEST  2 VIEW COMPARISON:  Chest x-ray of 11/14/2015 FINDINGS: Median sternotomy sutures are noted. The lungs are clear and no infiltrate or effusion is seen. Mediastinal and hilar contours are unremarkable. The heart is within normal limits in size. No acute bony abnormality is noted. IMPRESSION: No active cardiopulmonary disease. Electronically Signed   By: Ivar Drape M.D.   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 - SEE COMMENT. Microscopic Comment Sectioning reveals a relatively well circumscribed and calcified lesion,  consistent with thymoma. There is extracapsular extension as well as extra-thymic extension. Cytologically, the lesion is composed of both epithelioid cells and lymphoid cells, as highlighted by stains for CD20, CD45, cytokeratin 7, cytokeratin 8/18, CD5, cytokeratin 903 and cytokeratin AE1/AE3. The TTF-1 stain is essentially negative. (JBK:kh 10-31-15) Enid Cutter MD Pathologist, Electronic Signature (Case signed 10/31/2015)   Assessment / Plan:     Patient progressing satisfactorily after  partial sternotomy and resection of thymoma, at the time of resection the mass was not characterized by invasion into surrounding tissue and was well circumscribed, characteristics of a benign thymoma.   This case was presented to multi ethmoid thoracic oncology conference and the consensus was there was no indication for mediastinal radiation.  Plan to see the patient back in 2 months months, and get a CT scan of the chest which will be approximate 6 months postop . This will evaluate his postoperative status from resection of the thymoma and evaluate  the nonspecific right lower lobe 3 mm pulmonary nodule Noted on previous CT scans  [name] MD      [address] [place] [address] Office [phone]   Beeper [phone]  02/13/2016 11:54 AM

## 2016-03-23 ENCOUNTER — Other Ambulatory Visit: Payer: Self-pay | Admitting: *Deleted

## 2016-03-23 DIAGNOSIS — R911 Solitary pulmonary nodule: Secondary | ICD-10-CM

## 2016-04-23 ENCOUNTER — Encounter: Payer: Self-pay | Admitting: Cardiothoracic Surgery

## 2016-04-23 ENCOUNTER — Ambulatory Visit
Admission: RE | Admit: 2016-04-23 | Discharge: 2016-04-23 | Disposition: A | Discharge status: Home / Self Care | Payer: Medicare Other | Source: Ambulatory Visit | Attending: Cardiothoracic Surgery | Admitting: Cardiothoracic Surgery

## 2016-04-23 ENCOUNTER — Ambulatory Visit (INDEPENDENT_AMBULATORY_CARE_PROVIDER_SITE_OTHER): Payer: Medicare Other | Admitting: Cardiothoracic Surgery

## 2016-04-23 VITALS — BP 120/74 | HR 74 | Resp 16 | Ht 67.0 in | Wt 203.0 lb

## 2016-04-23 DIAGNOSIS — R911 Solitary pulmonary nodule: Secondary | ICD-10-CM

## 2016-04-23 DIAGNOSIS — D150 Benign neoplasm of thymus: Secondary | ICD-10-CM | POA: Diagnosis not present

## 2016-04-23 DIAGNOSIS — Z09 Encounter for follow-up examination after completed treatment for conditions other than malignant neoplasm: Secondary | ICD-10-CM | POA: Diagnosis not present

## 2016-04-23 NOTE — Progress Notes (Signed)
[address]       [place] [address]             [phone]      Johnny Mccoy  Medical Record [id number] Date of Birth: 01-13-1946  Referring: Nestor Lewandowsky, MD Primary Care: Idelle Crouch, MD  Chief Complaint:   POST OP FOLLOW UP 10/28/2015 OPERATIVE REPORT PREOPERATIVE DIAGNOSIS:  Anterior mediastinal mass. 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 PROCEDURE PERFORMED:  Resection of anterior mediastinal tumor through partial sternotomy. SURGEON:  Lanelle Bal, MD.  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 At the time of surgery the mass was well encapsulated and resected without evidence of invasion of surrounding tissue.   He notes at this point he feels the best he's felt in several years, denies any incisional pain, denies anginal type chest pain.  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   Past Medical History:  Diagnosis Date  . Anxiety    takes Lorazepam daily  . Arthritis   . Asthma    Albuterol as needed.Breo daily as well as Flovent  . CAD (coronary artery disease)   . Cataract   . Cataracts, bilateral    immature  . Chronic back pain    compression  . Complication of anesthesia    seizures after partial knee in 2014.Surgery was in am and an still in post op until 9 that night. Was sent to [facility] for 5 days d/t uncontrollable shaking,incoherant  . Depressed    takes Pristiq daily  . Essential tremor   . GERD (gastroesophageal reflux disease)    takes Dexilant daily as well as Zantac  . 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 History of bronchitis    Jan-Mar 2017  . History of hiatal hernia   .  Hyperlipidemia    takes Atorvastatin daily  . Hypertension    takes HCTZ,Amlodipine,and Metoprolol daily  . Insomnia    takes Trazodone nightly   . Joint pain   . Muscle spasm    in neck.Takes Flexeril as needed  . Numbness    right knee,right hand,and toes on left  . Seizures ([place])    takes Lamotrigine daily. Last seizure 6+ yrs ago     History  Smoking Status  . Never Smoker  Smokeless Tobacco  . Never Used    History  Alcohol Use  . Yes    Comment: wine and couple shots of liquor each evening     Allergies  Allergen Reactions  . No Known Allergies     Current Outpatient Prescriptions  Medication Sig Dispense Refill  . albuterol (PROAIR HFA) 108 (90 Base) MCG/ACT inhaler Inhale 1-2 puffs into the lungs every 6 (six) hours as needed for wheezing or shortness of breath.     Marland Kitchen amLODipine (NORVASC) 5 MG tablet Take 5 mg by mouth daily.     Marland Kitchen aspirin EC 81 MG tablet Take 81 mg by mouth daily.     Marland Kitchen atorvastatin (LIPITOR) 80 MG tablet Take 80 mg by mouth daily at 6 PM.     . BREO ELLIPTA 100-25 MCG/INH AEPB Inhale 1 puff into the lungs  daily.     . cetirizine (ZYRTEC) 10 MG tablet Take 10 mg by mouth daily.     Marland Kitchen dexlansoprazole (DEXILANT) 60 MG capsule Take 60 mg by mouth daily.     Marland Kitchen FLOVENT HFA 220 MCG/ACT inhaler INHALE 1 PUFF INTO THE LUNGS TWICE A DAY  12  . hydrochlorothiazide (HYDRODIURIL) 25 MG tablet Take by mouth.    . lamoTRIgine (LAMICTAL) 100 MG tablet 100 mg daily.     Marland Kitchen LORazepam (ATIVAN) 0.5 MG tablet Take 0.5 mg by mouth 2 (two) times daily.     . metoprolol succinate (TOPROL-XL) 50 MG 24 hr tablet TAKE 1 TABLET (50 MG TOTAL) BY MOUTH ONCE DAILY.  3  . NAMENDA XR 28 MG CP24 24 hr capsule Take 28 mg by mouth daily.     . nitroGLYCERIN (NITROLINGUAL) 0.4 MG/SPRAY spray Place 2 sprays under the tongue every 5 (five) minutes x 3 doses as needed.     Marland Kitchen PRISTIQ 100 MG 24 hr tablet Take 100 mg by mouth daily.     . ranitidine (ZANTAC) 150 MG tablet Take 150  mg by mouth at bedtime.    . traZODone (DESYREL) 100 MG tablet Take 100 mg by mouth at bedtime as needed.      No current facility-administered medications for this visit.        Physical Exam: BP 120/74 (BP Location: Right Arm, Patient Position: Sitting, Cuff Size: Large)   Pulse 74   Resp 16   Ht 5\' 7"  (1.702 m)   Wt 203 lb (92.1 kg)   SpO2 95% Comment: RA  BMI 31.79 kg/m   General appearance: alert, cooperative and appears stated age Neurologic: intact Heart: regular rate and rhythm, S1, S2 normal, no murmur, click, rub or gallop Lungs: clear to auscultation bilaterally Abdomen: soft, non-tender; bowel sounds normal; no masses,  no organomegaly Extremities: extremities normal, atraumatic, no cyanosis or edema and Homans sign is negative, no sign of DVT Wound: Upper sternal incision is well-healed and stable   Diagnostic Studies & Laboratory data:     Recent Radiology Findings:   Ct Chest Wo Contrast  Result Date: 04/23/2016 CLINICAL DATA:  71 year old male with history of pulmonary nodule. Followup study. Nonsmoker. EXAM: CT CHEST WITHOUT CONTRAST TECHNIQUE: Multidetector CT imaging of the chest was performed following the standard protocol without IV contrast. COMPARISON:  Multiple priors, most recently chest CT 08/01/2015. FINDINGS: Cardiovascular: Heart size is borderline enlarged. There is no significant pericardial fluid, thickening or pericardial calcification. There is aortic atherosclerosis, as well as atherosclerosis of the great vessels of the mediastinum and the coronary arteries, including calcified atherosclerotic plaque in the left main, left anterior descending, left circumflex and right coronary arteries. Calcifications of the aortic valve and mitral annulus. Mediastinum/Nodes: Previously noted anterior mediastinal mass has been resected with a small amount of mild scarring in the anterior mediastinal fat noted on today's examination. No pathologically enlarged  mediastinal or hilar lymph nodes. Please note that accurate exclusion of hilar adenopathy is limited on noncontrast CT scans. Esophagus is unremarkable in appearance. No axillary lymphadenopathy. Lungs/Pleura: Previously noted right lower lobe nodule is similar to prior examinations dating back to 05/07/2015 to measuring 3 x 4 mm (image 65 of series 4) in the anterior aspect of the right lower lobe abutting the major fissure. Tiny 2 mm subpleural nodule in the anterior aspect of the right middle lobe is also unchanged. A few other scattered 2-3 mm pulmonary nodules also appear similar to the  prior examination. There is no other suspicious appearing pulmonary nodule or mass. No acute consolidative airspace disease. No pleural effusions. Upper Abdomen: 1.4 cm low-attenuation area adjacent to the falciform ligament in segment 4B of the liver is stable compared to prior studies, favored to represent a tiny focal area of fatty infiltration. Aortic atherosclerosis. Musculoskeletal: Fixation wires in the manubrium from prior hemi-median sternotomy. There are no aggressive appearing lytic or blastic lesions noted in the visualized portions of the skeleton. IMPRESSION: 1. All previously noted tiny pulmonary nodules are stable in size, number and distribution compared to prior examinations and considered benign. 2. Aortic atherosclerosis, in addition to left main and 3 vessel coronary artery disease. Please note that although the presence of coronary artery calcium documents the presence of coronary artery disease, the severity of this disease and any potential stenosis cannot be assessed on this non-gated CT examination. Assessment for potential risk factor modification, dietary therapy or pharmacologic therapy may be warranted, if clinically indicated. 3. There are calcifications of the aortic valve and mitral annulus. Echocardiographic correlation for evaluation of potential valvular dysfunction may be warranted if  clinically indicated. 4. Status post resection of anterior mediastinal mass. Electronically Signed   By: Vinnie Langton M.D.   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 - SEE COMMENT. Microscopic Comment Sectioning reveals a relatively well circumscribed and calcified lesion, consistent with thymoma. There is extracapsular extension as well as extra-thymic extension. Cytologically, the lesion is composed of both epithelioid cells and lymphoid cells, as highlighted by stains for CD20, CD45, cytokeratin 7, cytokeratin 8/18, CD5, cytokeratin 903 and cytokeratin AE1/AE3. The TTF-1 stain is essentially negative. (JBK:kh 10-31-15) Enid Cutter MD Pathologist, Electronic Signature (Case signed 10/31/2015)   Assessment / Plan:    Patient progressing satisfactorily after  partial sternotomy and resection of thymoma, at the time of resection the mass was not characterized by invasion into surrounding tissue and was well circumscribed, characteristics of a benign thymoma.  All previously noted tiny pulmonary nodules are stable in size, number and distribution compared to prior examinations and considered benign, patient is a lifelong nonsmoker.   Patient doing well have not given him a follow-up appointment but because had to see him in his or Dr. Doy Hutching requested anytime. He will discuss with Dr. Doy Hutching a follow-up chest x-ray next year at his annual appointment.   [name] MD      [address] [place] [address] Office  [phone]   Beeper [phone]  04/23/2016 3:33 PM

## 2016-06-22 ENCOUNTER — Emergency Department
Admission: EM | Admit: 2016-06-22 | Discharge: 2016-06-22 | Disposition: A | Discharge status: Home / Self Care | Payer: Medicare Other | Attending: Emergency Medicine | Admitting: Emergency Medicine

## 2016-06-22 DIAGNOSIS — Z79899 Other long term (current) drug therapy: Secondary | ICD-10-CM | POA: Insufficient documentation

## 2016-06-22 DIAGNOSIS — J45909 Unspecified asthma, uncomplicated: Secondary | ICD-10-CM | POA: Diagnosis not present

## 2016-06-22 DIAGNOSIS — M5441 Lumbago with sciatica, right side: Secondary | ICD-10-CM | POA: Insufficient documentation

## 2016-06-22 DIAGNOSIS — I2510 Atherosclerotic heart disease of native coronary artery without angina pectoris: Secondary | ICD-10-CM | POA: Insufficient documentation

## 2016-06-22 DIAGNOSIS — Z7982 Long term (current) use of aspirin: Secondary | ICD-10-CM | POA: Diagnosis not present

## 2016-06-22 DIAGNOSIS — M5440 Lumbago with sciatica, unspecified side: Secondary | ICD-10-CM

## 2016-06-22 DIAGNOSIS — M5431 Sciatica, right side: Secondary | ICD-10-CM

## 2016-06-22 DIAGNOSIS — M545 Low back pain: Secondary | ICD-10-CM | POA: Diagnosis present

## 2016-06-22 DIAGNOSIS — I10 Essential (primary) hypertension: Secondary | ICD-10-CM | POA: Insufficient documentation

## 2016-06-22 MED ORDER — HYDROMORPHONE HCL 1 MG/ML IJ SOLN
1.0000 mg | Freq: Once | INTRAMUSCULAR | Status: AC
  Administered 2016-06-22: 1 mg via INTRAMUSCULAR
  Filled 2016-06-22: qty 1

## 2016-06-22 MED ORDER — DIAZEPAM 5 MG PO TABS: 5.0000 mg | ORAL_TABLET | Freq: Four times a day (QID) | ORAL | 0 refills | Status: AC | PRN

## 2016-06-22 NOTE — Discharge Instructions (Signed)
Take medications as prescribed. Discontinue Flexeril while taking Valium. Return to emergency department if symptoms worsen and follow up with PCP as needed.

## 2016-06-22 NOTE — ED Provider Notes (Signed)
[facility] Emergency Department Provider Note   ____________________________________________   I have reviewed the triage vital signs and the nursing notes.   HISTORY  Chief Complaint Hip Pain    HPI Johnny Mccoy is a 71 y.o. male presents with lumbar back pain with radicular symptoms radiating down the posterior right lower extremity to the knee. Patient states symptoms have been progressing over the last week. He reports 10/10 pain despite any medication regimen he has attempted. Patient denies any recent trauma, bowel and bladder dysfunction or saddle anesthesia. Patient denies any constitutional symptoms.   Past Medical History:  Diagnosis Date  . Anxiety    takes Lorazepam daily  . Arthritis   . Asthma    Albuterol as needed.Breo daily as well as Flovent  . CAD (coronary artery disease)   . Cataract   . Cataracts, bilateral    immature  . Chronic back pain    compression  . Complication of anesthesia    seizures after partial knee in 2014.Surgery was in am and an still in post op until 9 that night. Was sent to [facility] for 5 days d/t uncontrollable shaking,incoherant  . Depressed    takes Pristiq daily  . Essential tremor   . GERD (gastroesophageal reflux disease)    takes Dexilant daily as well as Zantac  . 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 History of bronchitis    Jan-Mar 2017  . History of hiatal hernia   . Hyperlipidemia    takes Atorvastatin daily  . Hypertension    takes HCTZ,Amlodipine,and Metoprolol daily  . Insomnia    takes Trazodone nightly   . Joint pain   . Muscle spasm    in neck.Takes Flexeril as needed  . Numbness    right knee,right hand,and toes on left  . Seizures ([place])    takes Lamotrigine daily. Last seizure 6+ yrs ago    Patient Active Problem List   Diagnosis Date Noted  . Lymphedema 12/16/2015  . Chronic venous insufficiency 12/16/2015  . Leg swelling 12/16/2015  . 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 Anxiety 05/23/2015  . Acid reflux 05/23/2015  . HBV (hepatitis B virus) infection 05/23/2015  . History of cardiac catheterization 05/23/2015  . Combined fat and carbohydrate induced hyperlipemia 05/23/2015  . Arteriosclerosis of coronary artery 05/23/2015  . Clinical depression 05/23/2015  . Cough 07/26/2014  . Benign essential HTN 07/16/2014  . Breathlessness on exertion 02/12/2014  . 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 Bundle branch block, right 05/29/2012  . History of knee surgery 05/26/2012    Past Surgical History:  Procedure Laterality Date  . CARDIAC CATHETERIZATION     2015  . CARPAL TUNNEL RELEASE    . COLONOSCOPY    . CORONARY ANGIOPLASTY WITH STENT PLACEMENT     x 3  . dental implants    . ESOPHAGOGASTRODUODENOSCOPY    . EYE SURGERY Bilateral   . EYE SURGERY     both eyes in 60's d/t muscle causing to squint  . HERNIA REPAIR     umbilical  . JOINT REPLACEMENT Right    knee  . KNEE ARTHROSCOPY    . MEDIAL PARTIAL KNEE REPLACEMENT Right   . MEDIASTERNOTOMY N/A 10/28/2015   Procedure: PARTIAL STERNOTOMY;  Surgeon: [name], MD;  Location: [facility];  Service: Thoracic;  Laterality: N/A;  . numerous hand surgery    . RESECTION OF  MEDIASTINAL MASS N/A 10/28/2015   Procedure: RESECTION OF anterier MEDIASTINAL TUMOR;  Surgeon: [name], MD;  Location: [facility];  Service: Thoracic;  Laterality: N/A;  . right hand fusion    . 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 TONSILLECTOMY      Prior to Admission medications   Medication Sig Start Date End Date Taking? Authorizing Provider  albuterol (PROAIR HFA) 108 (90 Base) MCG/ACT inhaler Inhale 1-2 puffs into the lungs every 6 (six) hours as needed for wheezing or shortness of breath.  02/18/15   Historical Provider, MD  amLODipine (NORVASC) 5 MG tablet Take 5 mg by mouth daily.  02/13/15   Historical Provider, MD  aspirin EC 81 MG tablet Take 81 mg by  mouth daily.     Historical Provider, MD  atorvastatin (LIPITOR) 80 MG tablet Take 80 mg by mouth daily at 6 PM.  09/17/14 04/23/16  Historical Provider, MD  BREO ELLIPTA 100-25 MCG/INH AEPB Inhale 1 puff into the lungs daily.  02/18/15   Historical Provider, MD  cetirizine (ZYRTEC) 10 MG tablet Take 10 mg by mouth daily.     Historical Provider, MD  dexlansoprazole (DEXILANT) 60 MG capsule Take 60 mg by mouth daily.  05/15/15   Historical Provider, MD  diazepam (VALIUM) 5 MG tablet Take 1 tablet (5 mg total) by mouth every 6 (six) hours as needed for anxiety or muscle spasms. Take 2.5 mg initially if no significant relief take additional 2.5 mg during 6QH PRN time frame. 06/22/16 06/22/17  Traci M Little, PA-C  FLOVENT HFA 220 MCG/ACT inhaler INHALE 1 PUFF INTO THE LUNGS TWICE A DAY 05/09/15   Historical Provider, MD  hydrochlorothiazide (HYDRODIURIL) 25 MG tablet Take by mouth.    Historical Provider, MD  lamoTRIgine (LAMICTAL) 100 MG tablet 100 mg daily.  06/12/10   Historical Provider, MD  LORazepam (ATIVAN) 0.5 MG tablet Take 0.5 mg by mouth 2 (two) times daily.  06/21/10   Historical Provider, MD  metoprolol succinate (TOPROL-XL) 50 MG 24 hr tablet TAKE 1 TABLET (50 MG TOTAL) BY MOUTH ONCE DAILY. 03/07/15   Historical Provider, MD  NAMENDA XR 28 MG CP24 24 hr capsule Take 28 mg by mouth daily.  05/04/15   Historical Provider, MD  nitroGLYCERIN (NITROLINGUAL) 0.4 MG/SPRAY spray Place 2 sprays under the tongue every 5 (five) minutes x 3 doses as needed.  11/26/14   Historical Provider, MD  PRISTIQ 100 MG 24 hr tablet Take 100 mg by mouth daily.  05/14/15   Historical Provider, MD  ranitidine (ZANTAC) 150 MG tablet Take 150 mg by mouth at bedtime.    Historical Provider, MD  traZODone (DESYREL) 100 MG tablet Take 100 mg by mouth at bedtime as needed.  04/21/10   Historical Provider, MD    Allergies No known allergies  Family History  Problem Relation Age of Onset  . Cancer Father 65    Bone   . COPD  Father   . Heart disease Mother   . Hyperlipidemia Mother   . Stroke Mother   . Heart disease Maternal Grandfather   . Hyperlipidemia Maternal Grandfather     Social History Social History  Substance Use Topics  . Smoking status: Never Smoker  . Smokeless tobacco: Never Used  . Alcohol use Yes     Comment: wine and couple shots of liquor each evening    Review of Systems Constitutional: No fever/chills Cardiovascular: Denies chest pain. Respiratory: Denies cough Gastrointestinal: No  abdominal pain.  No nausea, no vomiting.   Genitourinary: Negative for dysuria, bowel/bladder dysfunction. Musculoskeletal: Lumbar back pain, denies saddle anesthesia Skin: Negative for rash. Neurological: Negative for headaches or vision changes  ____________________________________________   PHYSICAL EXAM:  VITAL SIGNS: ED Triage Vitals [06/22/16 1424]  Enc Vitals Group     BP (!) 142/76     Pulse Rate 74     Resp 18     Temp 98.2 F (36.8 C)     Temp Source Oral     SpO2 95 %     Weight 202 lb (91.6 kg)     Height 5\' 7"  (1.702 m)     Head Circumference      Peak Flow      Pain Score 8     Pain Loc      Pain Edu?      Excl. in [place]?     Constitutional: Alert and oriented. Well appearing and in no acute distress. Head: Atraumatic. Cardiovascular: Normal rate, regular rhythm.  Good peripheral circulation. Respiratory: Normal respiratory effort.  No retractions.  Genitourinary: No bowel/bladder dysfunction or saddle anesthesia Musculoskeletal: No lower extremity tenderness nor edema.  No joint effusions. Neurologic:  Normal speech and language. No gross focal neurologic deficits are appreciated. Normal LE gross strength Skin:  Skin is warm, dry and intact. No rash noted. Psychiatric: Mood and affect are normal. Speech and behavior are normal.  ____________________________________________   LABS (all labs ordered are listed, but only abnormal results are displayed)  Labs  Reviewed - No data to display ____________________________________________  EKG no ____________________________________________  RADIOLOGY no ____________________________________________   PROCEDURES  Procedure(s) performed: no    Critical Care performed: no ____________________________________________   INITIAL IMPRESSION / ASSESSMENT AND PLAN / ED COURSE  Pertinent labs & imaging results that were available during my care of the patient were reviewed by me and considered in my medical decision making (see chart for details).  Patient's symptoms are consistent with lumbar back pain with sciatica. Physical exam was consistent with the patient's history and physical. Patient responded well to Dilaudid and Valium to reduce severe pain he is experiencing during his course of care here in the emergency department. He will be given a prescription for Valium for muscle relaxer and he will continue using his pain medicine he was provided from his PCP. Patient has a scheduled appointment with his orthopedic doctor on May 10 and plans to keep that to address current symptoms. Patient was instructed to return to the emergency department if symptoms worsen otherwise follow-up with his PCP as needed.      ____________________________________________   FINAL CLINICAL IMPRESSION(S) / ED DIAGNOSES  Final diagnoses:  Sciatica of right side  Back pain of lumbar region with sciatica      NEW MEDICATIONS STARTED DURING THIS VISIT:  Discharge Medication List as of 06/22/2016  4:28 PM    START taking these medications   Details  diazepam (VALIUM) 5 MG tablet Take 1 tablet (5 mg total) by mouth every 6 (six) hours as needed for anxiety or muscle spasms. Take 2.5 mg initially if no significant relief take additional 2.5 mg during 6QH PRN time frame., Starting Mon 06/22/2016, Until Tue 06/22/2017, Print         Note:  This document was prepared using Dragon voice recognition software and  may include unintentional dictation errors.   Laroy Apple Little, PA-C 06/22/16 [place], MD 06/22/16 2154

## 2016-06-22 NOTE — ED Triage Notes (Signed)
Pt states R hip pain with back pain going down to R knee for over a week. Alert, ambulatory, oriented. Pt states tried to see doctor but first appt was may 10th. xrays done Friday, showed degenerative disc, states possibly pushing on sciatic nerve

## 2016-06-25 ENCOUNTER — Other Ambulatory Visit: Payer: Self-pay | Admitting: Internal Medicine

## 2016-06-25 DIAGNOSIS — M5137 Other intervertebral disc degeneration, lumbosacral region: Secondary | ICD-10-CM

## 2016-06-30 ENCOUNTER — Ambulatory Visit
Admission: RE | Admit: 2016-06-30 | Discharge: 2016-06-30 | Disposition: A | Discharge status: Home / Self Care | Payer: Medicare Other | Source: Ambulatory Visit | Attending: Internal Medicine | Admitting: Internal Medicine

## 2016-06-30 ENCOUNTER — Ambulatory Visit: Payer: Medicare Other

## 2016-06-30 DIAGNOSIS — M48061 Spinal stenosis, lumbar region without neurogenic claudication: Secondary | ICD-10-CM | POA: Insufficient documentation

## 2016-06-30 DIAGNOSIS — M4316 Spondylolisthesis, lumbar region: Secondary | ICD-10-CM | POA: Insufficient documentation

## 2016-06-30 DIAGNOSIS — M5137 Other intervertebral disc degeneration, lumbosacral region: Secondary | ICD-10-CM | POA: Insufficient documentation

## 2016-06-30 DIAGNOSIS — M5136 Other intervertebral disc degeneration, lumbar region: Secondary | ICD-10-CM | POA: Insufficient documentation

## 2016-07-20 DIAGNOSIS — E876 Hypokalemia: Secondary | ICD-10-CM | POA: Insufficient documentation

## 2016-07-20 DIAGNOSIS — J9601 Acute respiratory failure with hypoxia: Secondary | ICD-10-CM | POA: Insufficient documentation

## 2016-07-20 DIAGNOSIS — J189 Pneumonia, unspecified organism: Secondary | ICD-10-CM | POA: Insufficient documentation

## 2016-07-20 DIAGNOSIS — J45909 Unspecified asthma, uncomplicated: Secondary | ICD-10-CM | POA: Insufficient documentation

## 2016-07-20 DIAGNOSIS — R410 Disorientation, unspecified: Secondary | ICD-10-CM | POA: Insufficient documentation

## 2016-07-20 DIAGNOSIS — M4800 Spinal stenosis, site unspecified: Secondary | ICD-10-CM | POA: Insufficient documentation

## 2016-08-10 DIAGNOSIS — G894 Chronic pain syndrome: Secondary | ICD-10-CM | POA: Insufficient documentation

## 2016-09-08 DIAGNOSIS — R569 Unspecified convulsions: Secondary | ICD-10-CM | POA: Insufficient documentation

## 2016-09-08 DIAGNOSIS — R251 Tremor, unspecified: Secondary | ICD-10-CM | POA: Insufficient documentation

## 2016-09-09 DIAGNOSIS — R413 Other amnesia: Secondary | ICD-10-CM | POA: Insufficient documentation

## 2016-09-11 DIAGNOSIS — E669 Obesity, unspecified: Secondary | ICD-10-CM | POA: Insufficient documentation

## 2016-09-11 DIAGNOSIS — G3184 Mild cognitive impairment, so stated: Secondary | ICD-10-CM | POA: Insufficient documentation

## 2016-09-11 DIAGNOSIS — M4316 Spondylolisthesis, lumbar region: Secondary | ICD-10-CM | POA: Insufficient documentation

## 2016-09-11 DIAGNOSIS — D696 Thrombocytopenia, unspecified: Secondary | ICD-10-CM | POA: Insufficient documentation

## 2016-09-25 DIAGNOSIS — H5210 Myopia, unspecified eye: Secondary | ICD-10-CM | POA: Insufficient documentation

## 2016-09-25 DIAGNOSIS — H02889 Meibomian gland dysfunction of unspecified eye, unspecified eyelid: Secondary | ICD-10-CM | POA: Insufficient documentation

## 2016-09-25 DIAGNOSIS — H6120 Impacted cerumen, unspecified ear: Secondary | ICD-10-CM | POA: Insufficient documentation

## 2016-09-25 DIAGNOSIS — H04129 Dry eye syndrome of unspecified lacrimal gland: Secondary | ICD-10-CM | POA: Insufficient documentation

## 2016-09-25 DIAGNOSIS — H518 Other specified disorders of binocular movement: Secondary | ICD-10-CM | POA: Insufficient documentation

## 2016-09-25 DIAGNOSIS — H524 Presbyopia: Secondary | ICD-10-CM | POA: Insufficient documentation

## 2016-10-08 ENCOUNTER — Ambulatory Visit: Payer: Medicare Other | Attending: Physical Medicine and Rehabilitation

## 2016-10-08 DIAGNOSIS — R262 Difficulty in walking, not elsewhere classified: Secondary | ICD-10-CM | POA: Diagnosis present

## 2016-10-08 DIAGNOSIS — M545 Low back pain: Secondary | ICD-10-CM | POA: Diagnosis present

## 2016-10-08 DIAGNOSIS — M6281 Muscle weakness (generalized): Secondary | ICD-10-CM | POA: Insufficient documentation

## 2016-10-08 NOTE — Therapy (Signed)
[place] MAIN [facility] SERVICES [address] [place], [place], [address] Phone: [phone]   Fax:  [phone]  Physical Therapy Evaluation  Patient Details  Name: Johnny Mccoy MRN: [id number] Date of Birth: 1967/09/14 Referring Provider: Dr. Launa Grill  Encounter Date: 10/08/2016      PT End of Session - 10/08/16 0954    Visit Number 1   Number of Visits 12   Date for PT Re-Evaluation 11/19/16   Authorization - Visit Number 1   Authorization - Number of Visits 10   PT Start Time 0812   PT Stop Time 0914   PT Time Calculation (min) 62 min   Equipment Utilized During Treatment Gait belt   Activity Tolerance Patient tolerated treatment well;Treatment limited secondary to medical complications (Comment)   Behavior During Therapy [facility] for tasks assessed/performed;Anxious      Past Medical History:  Diagnosis Date  . Anxiety    takes Lorazepam daily  . Arthritis   . Asthma    Albuterol as needed.Breo daily as well as Flovent  . CAD (coronary artery disease)   . Cataract   . Cataracts, bilateral    immature  . Chronic back pain    compression  . Complication of anesthesia    seizures after partial knee in 2014.Surgery was in am and an still in post op until 9 that night. Was sent to [facility] for 5 days d/t uncontrollable shaking,incoherant  . Depressed    takes Pristiq daily  . Essential tremor   . GERD (gastroesophageal reflux disease)    takes Dexilant daily as well as Zantac  . 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 History of bronchitis    Jan-Mar 2017  . History of hiatal hernia   . Hyperlipidemia    takes Atorvastatin daily  . Hypertension    takes HCTZ,Amlodipine,and Metoprolol daily  . Insomnia    takes Trazodone nightly   . Joint pain   . Muscle spasm    in neck.Takes Flexeril as needed  . Numbness    right knee,right hand,and toes on left  . Seizures ([place])    takes Lamotrigine daily. Last seizure 6+ yrs ago     Past Surgical History:  Procedure Laterality Date  . CARDIAC CATHETERIZATION     2015  . CARPAL TUNNEL RELEASE    . COLONOSCOPY    . CORONARY ANGIOPLASTY WITH STENT PLACEMENT     x 3  . dental implants    . ESOPHAGOGASTRODUODENOSCOPY    . EYE SURGERY Bilateral   . EYE SURGERY     both eyes in 60's d/t muscle causing to squint  . HERNIA REPAIR     umbilical  . JOINT REPLACEMENT Right    knee  . KNEE ARTHROSCOPY    . MEDIAL PARTIAL KNEE REPLACEMENT Right   . MEDIASTERNOTOMY N/A 10/28/2015   Procedure: PARTIAL STERNOTOMY;  Surgeon: [name], MD;  Location: [place];  Service: Thoracic;  Laterality: N/A;  . numerous hand surgery    . 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   Procedure: RESECTION OF anterier MEDIASTINAL TUMOR;  Surgeon: [name], MD;  Location: [place];  Service: Thoracic;  Laterality: N/A;  . right hand fusion    . 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 TONSILLECTOMY      There were no vitals filed for this visit.  [facility] PT Assessment - 10/08/16 0001      Assessment   Medical Diagnosis lumbar laminectomy   Referring Provider Dr. Launa Grill   Onset Date/Surgical Date 09/23/16   Hand Dominance Left   Next MD Visit 11/03/16 dr. Aris Lot GP 9/4.   Prior Therapy yes     Precautions   Precautions Back   Precaution Booklet Issued Yes (comment)   Precaution Comments no bending, twisting, lifting 5lb,    Required Braces or Orthoses --  bone stimalator     Restrictions   Weight Bearing Restrictions No     Balance Screen   Has the patient fallen in the past 6 months Yes   How many times? 3  May   Has the patient had a decrease in activity level because of a fear of falling?  Yes   Is the patient reluctant to leave their home because of a fear of falling?  Yes     [place] Private residence   Living Arrangements Spouse/significant other   Available Help at Discharge Family    Type of [place] to enter   Entrance Stairs-Number of Steps 2 steps then landing then 1 step   Entrance Stairs-Rails Right   [place] to live on main level with bedroom/bathroom   Home Equipment Walker - standard;Wheelchair - Psychologist, educational;Shower seat;Grab bars - tub/shower;Toilet riser   Additional Comments 5 steps, railing on left to go out ot patio     Prior Function   Level of Rachel Retired   Leisure garden     Cognition   Overall Cognitive Status Within Functional Limits for tasks assessed     Observation/Other Assessments   Skin Integrity lumbar surgical site covered with steristrips     Sensation   Light Touch Appears Intact     Coordination   Gross Motor Movements are Fluid and Coordinated Yes     Functional Tests   Functional tests Sit to Stand     Sit to Stand   Comments able to perform while controlling for precautions     Posture/Postural Control   Posture/Postural Control Postural limitations     Bed Mobility   Bed Mobility Rolling Right;Rolling Left;Left Sidelying to Sit;Sitting - Scoot to Edge of Bed;Sit to Sidelying Left;Sit to Sidelying Right   Rolling Right 6: Modified independent (Device/Increase time)   Rolling Left 6: Modified independent (Device/Increase time)   Left Sidelying to Sit 6: Modified independent (Device/Increase time)   Sitting - Scoot to Edge of Bed 6: Modified independent (Device/Increase time)   Sit to Sidelying Right 6: Modified independent (Device/Increase time)   Sit to Sidelying Left 6: Modified independent (Device/Increase time)     Transfers   Transfers Sit to Stand;Stand to Sit   Sit to Stand 6: Modified independent (Device/Increase time)     Ambulation/Gait   Ambulation/Gait Yes   Ambulation/Gait Assistance 5: Supervision;Other (comment)  CGA   Assistive device Standard walker   Gait Pattern Decreased step length - right;Decreased step length -  left;Decreased stance time - right;Decreased stride length;Decreased weight shift to right;Shuffle;Narrow base of support   Ambulation Surface Level   Gait velocity 1.42m/s    - On blood thinner for 6 months, fall risk,      PAIN: Worst pain: 8-9/10 Lowest pain; 3-4/10  =combination of medication and therapy   Surgical sight on lumbar  AROM not tested due to surgical precautions  STRENGTH:  Graded on a 0-5 scale Muscle Group Left Right  Hip Flex 4/5 4-/5  Hip Abd 4/5 4-/5  Hip Add 4/5 4-/5  Hip Ext DNT DNT  Hip IR/ER DNT DNT  Knee Flex 4/5 4-/5  Knee Ext 4-/5 3+/5  Ankle DF 4/5 4/5  Ankle PF 4/5 4/5   SENSATION: functional   FUNCTIONAL MOBILITY: See flowsheet  BALANCE: Dynamic Sitting Balance  Normal Able to sit unsupported and weight shift across midline maximally   Good Able to sit unsupported and weight shift across midline moderately   Good-/Fair+ Able to sit unsupported and weight shift across midline minimally   Fair Minimal weight shifting ipsilateral/front, difficulty crossing midline x  Fair- Reach to ipsilateral side and unable to weight shift   Poor + Able to sit unsupported with min A and reach to ipsilateral side, unable to weight shift   Poor Able to sit unsupported with mod A and reach ipsilateral/front-can't cross midline    Static Dynamic Balance  Normal Stand independently unsupported, able to weight shift and cross midline maximally   Good Stand independently unsupported, able to weight shift and cross midline moderately   Good-/Fair+ Stand independently unsupported, able to weight shift across midline minimally   Fair Stand independently unsupported, weight shift, and reach ipsilaterally, loss of balance when crossing midline   Poor+ Able to stand with Min A and reach ipsilaterally, unable to weight shift x  Poor+ Able to stand with Mod A and minimally reach ipsilaterally, unable to cross midline.    Static Sitting Balance  Normal Able to  maintain balance against maximal resistance   Good Able to maintain balance against moderate resistance   Good-/Fair+ Accepts minimal resistance   Fair Able to sit unsupported without balance loss and without UE support x  Poor+ Able to maintain with Minimal assistance from individual or chair   Poor Unable to maintain balance-requires mod/max support from individual or chair        OUTCOME MEASURES: TEST Outcome Interpretation  5 times sit<>stand 16 sec >60 yo, >15 sec indicates increased risk for falls  10 meter walk test        10       S= 1.0  m/s <1.0 m/s indicates increased risk for falls; limited community ambulator  Timed up and Go           21      sec <14 sec indicates increased risk for falls   FABQ: 30/30, 36/66, Total 66/96   Tx: Sit to stand TA contraction Seated marching ambulation      Objective measurements completed on examination: See above findings.         PT Education - 10/08/16 [phone]    Education provided Yes   Education Details HEP, precautions, transfers   Person(s) Educated Patient;Spouse   Methods Explanation;Demonstration;Handout   Comprehension Verbalized understanding;Returned demonstration          PT Short Term Goals - 10/08/16 1007      PT SHORT TERM GOAL #1   Title Patient will ambulate with cane for 100 ft to allow for more independent mobilization   Baseline 8/9 walk with walker   Time 2   Period Weeks   Status New   Target Date 10/22/16     PT SHORT TERM GOAL #2   Title Patient will report a worst pain of 4/10 on VAS to improve tolerance with ADLs and reduced symptoms with activities.    Baseline 8/9: pain  worst 8/10   Time 2   Period Weeks   Status New   Target Date 10/22/16     PT SHORT TERM GOAL #3   Title Patient will increase 10 meter walk test to >1.56m/s as to improve gait speed for better community ambulation and to reduce fall risk.   Baseline 8/9: 1.65m/s   Time 2   Period Weeks   Status New   Target  Date 10/22/16           PT Long Term Goals - 10/08/16 1011      PT LONG TERM GOAL #1   Title Patient will be independent in home exercise program to improve strength/mobility for better functional independence with ADLs.   Baseline given HEP   Time 6   Period Weeks   Status New   Target Date 11/19/16     PT LONG TERM GOAL #2   Title Patient will decrease FABQ score to < 40/96 to demonstrate improved quality of life.    Baseline 8/9: 66/96   Time 6   Period Weeks   Status New   Target Date 11/19/16     PT LONG TERM GOAL #3   Title Patient will reduce timed up and go to <11 seconds to reduce fall risk and demonstrate improved transfer/gait ability.   Baseline 8/9: 21 seconds   Time 6   Period Weeks   Status New   Target Date 11/19/16     PT LONG TERM GOAL #4   Title Patient will report a worst pain of 3/10 on VAS in   to improve tolerance with ADLs and reduced symptoms with activities.    Baseline 8/9: worst 8-9/10 pain   Time 6   Period Weeks   Status New   Target Date 11/19/16                Plan - 10/08/16 0955    Clinical Impression Statement Patient is a pleasant 71 year old male s/p L4-S1 lam/fusion 09/23/16. Pt. Is currently on blood thinners and on back precautions involving no turning, twisting, bending, and lifting. Pt. Demonstrates proper log rolling technique, sit to stand transfer, and understanding of precautions. Ambulates utilizing walker due to fear of falling/limited strength. Bilateral LE's are weaker with R>L and pt. 10MWT= 1.30m/s with walker, TUG=21 seconds, 5xSTS=15 seconds, FABQ=66/96. Patient would benefit from skilled physical therapy to improve strength, mobility, ambulation, and decrease fall risk to allow for return to prior level of function.    History and Personal Factors relevant to plan of care: This patient presents with 3, personal factors/ comorbidities , and 1-2, body elements including body structures and functions, activity  limitations and or participation restrictions. Patient's condition is evolving.   Clinical Presentation Evolving   Clinical Presentation due to: s/p L4-S1 lam/fus   Clinical Decision Making Moderate   Rehab Potential Good   Clinical Impairments Affecting Rehab Potential CAD (coronary artery disease) , Hyperlipidemia, mixed , Amnesia , Hepatitis B , GERD (gastroesophageal reflux disease) , Anxiety, unspecified , Depression, unspecified , Hx of cardiac catheterization , Right bundle branch block , Valvular heart disease,  Benign essential hypertension,  Thymus neoplasm , Seizure (CMS-HCC) ,Thrombocytopenia (CMS-HCC) , Spondylolisthesis of lumbar region Asthma, unspecified OSA (obstructive sleep apnea) , Stroke (CMS-HCC), Ischemic embolic stroke (CMS-HCC) , Lymphedema , Meibomian gland dysfunction, Myopia, Presbyopia,  Spinal stenosis , Thymoma S/P lumbar laminectomy   PT Frequency 2x / week   PT Duration 6 weeks   PT  Treatment/Interventions ADLs/Self Care Home Management;Cryotherapy;Electrical Stimulation;Iontophoresis 4mg /ml Dexamethasone;Moist Heat;Ultrasound;Functional mobility training;Stair training;Gait training;DME Instruction;Therapeutic activities;Therapeutic exercise;Balance training;Neuromuscular re-education;Manual techniques;Patient/family education;Scar mobilization;Passive range of motion;Dry needling;Energy conservation;Visual/perceptual remediation/compensation   PT Next Visit Plan walking, core, strength LE   PT Home Exercise Plan see sheet   Consulted and Agree with Plan of Care Patient;Family member/caregiver   Family Member Consulted wife      Patient will benefit from skilled therapeutic intervention in order to improve the following deficits and impairments:  Abnormal gait, Decreased activity tolerance, Decreased balance, Decreased knowledge of use of DME, Decreased endurance, Decreased mobility, Decreased range of motion, Decreased safety awareness, Decreased strength, Decreased  scar mobility, Difficulty walking, Dizziness, Impaired perceived functional ability, Improper body mechanics, Postural dysfunction, Pain  Visit Diagnosis: Acute midline low back pain, with sciatica presence unspecified  Difficulty in walking, not elsewhere classified  Muscle weakness (generalized)      G-Codes - 10/16/2016 1019    Functional Assessment Tool Used (Outpatient Only) 10MWT, TUG, 5xSTS, FABQ, MMT, gait analysis, clinical judgement   Functional Limitation Mobility: Walking and moving around   Mobility: Walking and Moving Around Current Status [phone]) At least 60 percent but less than 80 percent impaired, limited or restricted   Mobility: Walking and Moving Around Goal Status [phone]) At least 20 percent but less than 40 percent impaired, limited or restricted       Problem List Patient Active Problem List   Diagnosis Date Noted  . Lymphedema 12/16/2015  . Chronic venous insufficiency 12/16/2015  . Leg swelling 12/16/2015  . 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 Anxiety 05/23/2015  . Acid reflux 05/23/2015  . HBV (hepatitis B virus) infection 05/23/2015  . History of cardiac catheterization 05/23/2015  . Combined fat and carbohydrate induced hyperlipemia 05/23/2015  . Arteriosclerosis of coronary artery 05/23/2015  . Clinical depression 05/23/2015  . Cough 07/26/2014  . Benign essential HTN 07/16/2014  . Breathlessness on exertion 02/12/2014  . Ischemic embolic stroke ([place]) 18/56/3149  . Bundle branch block, right 05/29/2012  . History of knee surgery 05/26/2012   Janna Arch, PT, DPT   Janna Arch 10/16/2016, 10:21 AM  [place] MAIN [facility] SERVICES [address] [place], [place], [address] Phone: [phone]   Fax:  [phone]  Name: LATHYN GRIGGS MRN: [id number] Date of Birth: [birth year]

## 2016-10-13 ENCOUNTER — Ambulatory Visit: Payer: Medicare Other

## 2016-10-13 DIAGNOSIS — R262 Difficulty in walking, not elsewhere classified: Secondary | ICD-10-CM

## 2016-10-13 DIAGNOSIS — M545 Low back pain: Secondary | ICD-10-CM | POA: Diagnosis not present

## 2016-10-13 DIAGNOSIS — M6281 Muscle weakness (generalized): Secondary | ICD-10-CM

## 2016-10-13 NOTE — Therapy (Signed)
[place] MAIN [facility] SERVICES [address] [place], [place], [address] Phone: [phone]   Fax:  [phone]  Physical Therapy Treatment  Patient Details  Name: NETHANIEL MATTIE MRN: [id number] Date of Birth: 12-15-1945 Referring Provider: Dr. Launa Grill  Encounter Date: 10/13/2016      PT End of Session - 10/13/16 1426    Visit Number 2   Number of Visits 12   Date for PT Re-Evaluation 11/19/16   Authorization - Visit Number 2   Authorization - Number of Visits 10   PT Start Time 1115   PT Stop Time 1202   PT Time Calculation (min) 47 min   Equipment Utilized During Treatment Gait belt   Activity Tolerance Patient tolerated treatment well;Treatment limited secondary to medical complications (Comment)   Behavior During Therapy [facility] for tasks assessed/performed;Anxious      Past Medical History:  Diagnosis Date  . Anxiety    takes Lorazepam daily  . Arthritis   . Asthma    Albuterol as needed.Breo daily as well as Flovent  . CAD (coronary artery disease)   . Cataract   . Cataracts, bilateral    immature  . Chronic back pain    compression  . Complication of anesthesia    seizures after partial knee in 2014.Surgery was in am and an still in post op until 9 that night. Was sent to [facility] for 5 days d/t uncontrollable shaking,incoherant  . Depressed    takes Pristiq daily  . Essential tremor   . GERD (gastroesophageal reflux disease)    takes Dexilant daily as well as Zantac  . 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 History of bronchitis    Jan-Mar 2017  . History of hiatal hernia   . Hyperlipidemia    takes Atorvastatin daily  . Hypertension    takes HCTZ,Amlodipine,and Metoprolol daily  . Insomnia    takes Trazodone nightly   . Joint pain   . Muscle spasm    in neck.Takes Flexeril as needed  . Numbness    right knee,right hand,and toes on left  . Seizures ([place])    takes Lamotrigine daily. Last seizure 6+ yrs ago     Past Surgical History:  Procedure Laterality Date  . CARDIAC CATHETERIZATION     2015  . CARPAL TUNNEL RELEASE    . COLONOSCOPY    . CORONARY ANGIOPLASTY WITH STENT PLACEMENT     x 3  . dental implants    . ESOPHAGOGASTRODUODENOSCOPY    . EYE SURGERY Bilateral   . EYE SURGERY     both eyes in 60's d/t muscle causing to squint  . HERNIA REPAIR     umbilical  . JOINT REPLACEMENT Right    knee  . KNEE ARTHROSCOPY    . MEDIAL PARTIAL KNEE REPLACEMENT Right   . MEDIASTERNOTOMY N/A 10/28/2015   Procedure: PARTIAL STERNOTOMY;  Surgeon: [name], MD;  Location: [place];  Service: Thoracic;  Laterality: N/A;  . numerous hand surgery    . 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   Procedure: RESECTION OF anterier MEDIASTINAL TUMOR;  Surgeon: [name], MD;  Location: [place];  Service: Thoracic;  Laterality: N/A;  . right hand fusion    . 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 TONSILLECTOMY      There were no vitals filed for this visit.  TherEx     Subjective Assessment -  10/13/16 1424    Subjective Patient presents with increased back pain. He is having difficulty negotiating obstacles while at home   Patient is accompained by: Family member   Pertinent History Pt is a pleasant 71 year old male who presents s/p L4-S1 laminectomy/fusion on 09/23/16 and was discharged yesterday from the hospital. His back pain started in April and he has had a complicated history since then involving multiple falls in May due to overmedication from conflicting medications. Prior to April he was independent with all tasks.  He is having difficulty adapting to home due to having a lot of area rugs that are different heights, running obstacle course from objects in house while in the home. The bed at home is higher then he is used to. Patient wants to return to gardening and is currently on blood thinner for 6 months and is a fall risk.    Limitations  Sitting;Lifting;Standing;Walking;House hold activities;Other (comment)   How long can you sit comfortably? hour   How long can you stand comfortably? 30 minutes   How long can you walk comfortably? 10 minutes   Diagnostic tests surgery   Patient Stated Goals returning to gardening, like to strengthen, feel more confident in ambulation, decrease fear of fall, balance, get back to driving   Currently in Pain? Yes   Pain Score 5    Pain Location Back   Pain Orientation Lower   Pain Descriptors / Indicators Aching;Contraction;Constant   Pain Type Surgical pain   Pain Onset 1 to 4 weeks ago   Pain Frequency Constant    TA contractions 10x 3-5 second holds, cues for proper muscle recruitment  Heel slides supine with TA contraction,rest after 6x each leg, then repeated for 10 total each leg Single leg raise 6 inch off table to PT hands 10x each leg, cues for abdominal control   Log roll on and off of bed ambulating around 7 cones in hallway, practicing lifting feet, avoiding twisting/crossing feet x 4 trials Standing posture in mirror Seated marching x 15 Seated abduction with RTB x 10, cues for upright posture  Frequent verbal cues for breathing pattern.   Pt. response to medical necessity:  Patient will benefit from skilled physical therapy to improve strength, mobility, ambulation, and decrease fall risk to allow for return to prior level of function.                             PT Education - 10/13/16 1425    Education provided Yes   Education Details HEP, safety negotiating obstacles when walking, breathing, lifting feet   Person(s) Educated Patient   Methods Explanation;Verbal cues   Comprehension Verbalized understanding;Returned demonstration;Verbal cues required          PT Short Term Goals - 10/08/16 1007      PT SHORT TERM GOAL #1   Title Patient will ambulate with cane for 100 ft to allow for more independent mobilization   Baseline 8/9 walk  with walker   Time 2   Period Weeks   Status New   Target Date 10/22/16     PT SHORT TERM GOAL #2   Title Patient will report a worst pain of 4/10 on VAS to improve tolerance with ADLs and reduced symptoms with activities.    Baseline 8/9: pain worst 8/10   Time 2   Period Weeks   Status New   Target Date 10/22/16     PT  SHORT TERM GOAL #3   Title Patient will increase 10 meter walk test to >1.23m/s as to improve gait speed for better community ambulation and to reduce fall risk.   Baseline 8/9: 1.51m/s   Time 2   Period Weeks   Status New   Target Date 10/22/16           PT Long Term Goals - 10/08/16 1011      PT LONG TERM GOAL #1   Title Patient will be independent in home exercise program to improve strength/mobility for better functional independence with ADLs.   Baseline given HEP   Time 6   Period Weeks   Status New   Target Date 11/19/16     PT LONG TERM GOAL #2   Title Patient will decrease FABQ score to < 40/96 to demonstrate improved quality of life.    Baseline 8/9: 66/96   Time 6   Period Weeks   Status New   Target Date 11/19/16     PT LONG TERM GOAL #3   Title Patient will reduce timed up and go to <11 seconds to reduce fall risk and demonstrate improved transfer/gait ability.   Baseline 8/9: 21 seconds   Time 6   Period Weeks   Status New   Target Date 11/19/16     PT LONG TERM GOAL #4   Title Patient will report a worst pain of 3/10 on VAS in   to improve tolerance with ADLs and reduced symptoms with activities.    Baseline 8/9: worst 8-9/10 pain   Time 6   Period Weeks   Status New   Target Date 11/19/16               Plan - 10/13/16 1427    Clinical Impression Statement Pt. Demonstrated ability to properly recruit TA musculature without compensation. Ambulation required frequent cueing for lifting feet and not crossing LE's for proper mechanics. Patient experienced dizziness with ambulation due to forgetting to breathe. Dizziness  was decreased with frequent verbal cues for breathing pattern. Patient will benefit from skilled physical therapy to improve strength, mobility, ambulation, and decrease fall risk to allow for return to prior level of function.    Rehab Potential Good   Clinical Impairments Affecting Rehab Potential CAD (coronary artery disease) , Hyperlipidemia, mixed , Amnesia , Hepatitis B , GERD (gastroesophageal reflux disease) , Anxiety, unspecified , Depression, unspecified , Hx of cardiac catheterization , Right bundle branch block , Valvular heart disease,  Benign essential hypertension,  Thymus neoplasm , Seizure (CMS-HCC) ,Thrombocytopenia (CMS-HCC) , Spondylolisthesis of lumbar region Asthma, unspecified OSA (obstructive sleep apnea) , Stroke (CMS-HCC), Ischemic embolic stroke (CMS-HCC) , Lymphedema , Meibomian gland dysfunction, Myopia, Presbyopia,  Spinal stenosis , Thymoma S/P lumbar laminectomy   PT Frequency 2x / week   PT Duration 6 weeks   PT Treatment/Interventions ADLs/Self Care Home Management;Cryotherapy;Electrical Stimulation;Iontophoresis 4mg /ml Dexamethasone;Moist Heat;Ultrasound;Functional mobility training;Stair training;Gait training;DME Instruction;Therapeutic activities;Therapeutic exercise;Balance training;Neuromuscular re-education;Manual techniques;Patient/family education;Scar mobilization;Passive range of motion;Dry needling;Energy conservation;Visual/perceptual remediation/compensation   PT Next Visit Plan walking, core, strength LE   PT Home Exercise Plan see sheet   Consulted and Agree with Plan of Care Patient;Family member/caregiver   Family Member Consulted wife      Patient will benefit from skilled therapeutic intervention in order to improve the following deficits and impairments:  Abnormal gait, Decreased activity tolerance, Decreased balance, Decreased knowledge of use of DME, Decreased endurance, Decreased mobility, Decreased range of motion, Decreased safety awareness,  Decreased  strength, Decreased scar mobility, Difficulty walking, Dizziness, Impaired perceived functional ability, Improper body mechanics, Postural dysfunction, Pain  Visit Diagnosis: Acute midline low back pain, with sciatica presence unspecified  Difficulty in walking, not elsewhere classified  Muscle weakness (generalized)     Problem List Patient Active Problem List   Diagnosis Date Noted  . Lymphedema 12/16/2015  . Chronic venous insufficiency 12/16/2015  . Leg swelling 12/16/2015  . 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 Anxiety 05/23/2015  . Acid reflux 05/23/2015  . HBV (hepatitis B virus) infection 05/23/2015  . History of cardiac catheterization 05/23/2015  . Combined fat and carbohydrate induced hyperlipemia 05/23/2015  . Arteriosclerosis of coronary artery 05/23/2015  . Clinical depression 05/23/2015  . Cough 07/26/2014  . Benign essential HTN 07/16/2014  . Breathlessness on exertion 02/12/2014  . Ischemic embolic stroke ([place]) 16/38/4665  . Bundle branch block, right 05/29/2012  . History of knee surgery 05/26/2012  Janna Arch, PT, DPT   Janna Arch 10/13/2016, 6:07 PM  [place] MAIN [facility] SERVICES [address] [place], [place], [address] Phone: [phone]   Fax:  [phone]  Name: BEJAMIN HACKBART MRN: [id number] Date of Birth: [birth year]

## 2016-10-15 ENCOUNTER — Ambulatory Visit: Payer: Medicare Other

## 2016-10-15 DIAGNOSIS — M6281 Muscle weakness (generalized): Secondary | ICD-10-CM

## 2016-10-15 DIAGNOSIS — R262 Difficulty in walking, not elsewhere classified: Secondary | ICD-10-CM

## 2016-10-15 DIAGNOSIS — M545 Low back pain: Secondary | ICD-10-CM

## 2016-10-15 NOTE — Therapy (Signed)
[place] MAIN [facility] SERVICES [address] [place], [place], [address] Phone: [phone]   Fax:  [phone]  Physical Therapy Treatment  Patient Details  Name: Johnny Mccoy MRN: [id number] Date of Birth: Aug 27, 1945 Referring Provider: Dr. Launa Grill  Encounter Date: 10/15/2016      PT End of Session - 10/15/16 1141    Visit Number 3   Number of Visits 12   Date for PT Re-Evaluation 11/19/16   Authorization - Visit Number 3   Authorization - Number of Visits 10   PT Start Time 0900   PT Stop Time 0945   PT Time Calculation (min) 45 min   Equipment Utilized During Treatment Gait belt   Activity Tolerance Patient tolerated treatment well;Treatment limited secondary to medical complications (Comment)   Behavior During Therapy [facility] for tasks assessed/performed;Anxious      Past Medical History:  Diagnosis Date  . Anxiety    takes Lorazepam daily  . Arthritis   . Asthma    Albuterol as needed.Breo daily as well as Flovent  . CAD (coronary artery disease)   . Cataract   . Cataracts, bilateral    immature  . Chronic back pain    compression  . Complication of anesthesia    seizures after partial knee in 2014.Surgery was in am and an still in post op until 9 that night. Was sent to [facility] for 5 days d/t uncontrollable shaking,incoherant  . Depressed    takes Pristiq daily  . Essential tremor   . GERD (gastroesophageal reflux disease)    takes Dexilant daily as well as Zantac  . 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 History of bronchitis    Jan-Mar 2017  . History of hiatal hernia   . Hyperlipidemia    takes Atorvastatin daily  . Hypertension    takes HCTZ,Amlodipine,and Metoprolol daily  . Insomnia    takes Trazodone nightly   . Joint pain   . Muscle spasm    in neck.Takes Flexeril as needed  . Numbness    right knee,right hand,and toes on left  . Seizures ([place])    takes Lamotrigine daily. Last seizure 6+ yrs ago     Past Surgical History:  Procedure Laterality Date  . CARDIAC CATHETERIZATION     2015  . CARPAL TUNNEL RELEASE    . COLONOSCOPY    . CORONARY ANGIOPLASTY WITH STENT PLACEMENT     x 3  . dental implants    . ESOPHAGOGASTRODUODENOSCOPY    . EYE SURGERY Bilateral   . EYE SURGERY     both eyes in 60's d/t muscle causing to squint  . HERNIA REPAIR     umbilical  . JOINT REPLACEMENT Right    knee  . KNEE ARTHROSCOPY    . MEDIAL PARTIAL KNEE REPLACEMENT Right   . MEDIASTERNOTOMY N/A 10/28/2015   Procedure: PARTIAL STERNOTOMY;  Surgeon: [name], MD;  Location: [place];  Service: Thoracic;  Laterality: N/A;  . numerous hand surgery    . 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   Procedure: RESECTION OF anterier MEDIASTINAL TUMOR;  Surgeon: [name], MD;  Location: [place];  Service: Thoracic;  Laterality: N/A;  . right hand fusion    . 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 TONSILLECTOMY      There were no vitals filed for this visit.      Subjective Assessment -  10/15/16 0910    Subjective Patient is having out of body  nightmares affecting sleep. Compliant with HEP and gets up every hour when resting during the day    Patient is accompained by: Family member   Pertinent History Pt is a pleasant 71 year old male who presents s/p L4-S1 laminectomy/fusion on 09/23/16 and was discharged yesterday from the hospital. His back pain started in April and he has had a complicated history since then involving multiple falls in May due to overmedication from conflicting medications. Prior to April he was independent with all tasks.  He is having difficulty adapting to home due to having a lot of area rugs that are different heights, running obstacle course from objects in house while in the home. The bed at home is higher then he is used to. Patient wants to return to gardening and is currently on blood thinner for 6 months and is a fall risk.    Limitations  Sitting;Lifting;Standing;Walking;House hold activities;Other (comment)   How long can you sit comfortably? hour   How long can you stand comfortably? 30 minutes   How long can you walk comfortably? 10 minutes   Diagnostic tests surgery   Patient Stated Goals returning to gardening, like to strengthen, feel more confident in ambulation, decrease fear of fall, balance, get back to driving   Currently in Pain? Yes   Pain Score 5    Pain Location Back   Pain Orientation Lower   Pain Descriptors / Indicators Aching;Contraction   Pain Type Surgical pain   Pain Onset 1 to 4 weeks ago   Pain Frequency Constant    TherEx   TA contractions 10x 3-5 second holds, cues for proper muscle recruitment  Heel slides supine with TA contraction,rest after 6x each leg, then repeated for 10 total each leg TA contraction with marches 12x TA contraction with UE raises 10x Standing marching in // bars cues for abdominal contraction  Side stepping in // bars 4x with cues for slight knee bend to practice walking through doorways at hoome.  ambulating in hallway with RW and CGA with practice scanning halls without twisting back 2x100 ft Log roll on and off of bed Seated adduction with Green ball x 12 Seated abduction with RTB x 15, cues for upright posture  Neuro Re-ed Standing with one hand on // bars with upright posture Upright posture with decreased UE support Stepping over two consecutive half foam rollers in // bars 6x with BUE support     Pt. response to medical necessity:  Patient will benefit from skilled physical therapy to improve strength, mobility, ambulation, and decrease fall risk to allow for return to prior level of function.                            PT Education - 10/15/16 0911    Education provided Yes   Education Details TA contraction for protecting back   Person(s) Educated Patient   Methods Explanation;Demonstration   Comprehension Verbalized  understanding;Returned demonstration          PT Short Term Goals - 10/08/16 1007      PT SHORT TERM GOAL #1   Title Patient will ambulate with cane for 100 ft to allow for more independent mobilization   Baseline 8/9 walk with walker   Time 2   Period Weeks   Status New   Target Date 10/22/16     PT SHORT TERM GOAL #  2   Title Patient will report a worst pain of 4/10 on VAS to improve tolerance with ADLs and reduced symptoms with activities.    Baseline 8/9: pain worst 8/10   Time 2   Period Weeks   Status New   Target Date 10/22/16     PT SHORT TERM GOAL #3   Title Patient will increase 10 meter walk test to >1.72m/s as to improve gait speed for better community ambulation and to reduce fall risk.   Baseline 8/9: 1.17m/s   Time 2   Period Weeks   Status New   Target Date 10/22/16           PT Long Term Goals - 10/08/16 1011      PT LONG TERM GOAL #1   Title Patient will be independent in home exercise program to improve strength/mobility for better functional independence with ADLs.   Baseline given HEP   Time 6   Period Weeks   Status New   Target Date 11/19/16     PT LONG TERM GOAL #2   Title Patient will decrease FABQ score to < 40/96 to demonstrate improved quality of life.    Baseline 8/9: 66/96   Time 6   Period Weeks   Status New   Target Date 11/19/16     PT LONG TERM GOAL #3   Title Patient will reduce timed up and go to <11 seconds to reduce fall risk and demonstrate improved transfer/gait ability.   Baseline 8/9: 21 seconds   Time 6   Period Weeks   Status New   Target Date 11/19/16     PT LONG TERM GOAL #4   Title Patient will report a worst pain of 3/10 on VAS in   to improve tolerance with ADLs and reduced symptoms with activities.    Baseline 8/9: worst 8-9/10 pain   Time 6   Period Weeks   Status New   Target Date 11/19/16               Plan - 10/15/16 1142    Clinical Impression Statement Patient demonstrated carryover  of ambulatory techniques. Progression of ambulation with head turns while maintaining back precautions performed. Patient continues to be fearful of movement and standing due to pain and fear of falling. Education and interventions based on mobility around home setting performed with patient demonstrating understanding. Patient will benefit from skilled physical therapy to improve strength, mobility, ambulation, and decrease fall risk to allow for return to prior level of function.   Rehab Potential Good   Clinical Impairments Affecting Rehab Potential CAD (coronary artery disease) , Hyperlipidemia, mixed , Amnesia , Hepatitis B , GERD (gastroesophageal reflux disease) , Anxiety, unspecified , Depression, unspecified , Hx of cardiac catheterization , Right bundle branch block , Valvular heart disease,  Benign essential hypertension,  Thymus neoplasm , Seizure (CMS-HCC) ,Thrombocytopenia (CMS-HCC) , Spondylolisthesis of lumbar region Asthma, unspecified OSA (obstructive sleep apnea) , Stroke (CMS-HCC), Ischemic embolic stroke (CMS-HCC) , Lymphedema , Meibomian gland dysfunction, Myopia, Presbyopia,  Spinal stenosis , Thymoma S/P lumbar laminectomy   PT Frequency 2x / week   PT Duration 6 weeks   PT Treatment/Interventions ADLs/Self Care Home Management;Cryotherapy;Electrical Stimulation;Iontophoresis 4mg /ml Dexamethasone;Moist Heat;Ultrasound;Functional mobility training;Stair training;Gait training;DME Instruction;Therapeutic activities;Therapeutic exercise;Balance training;Neuromuscular re-education;Manual techniques;Patient/family education;Scar mobilization;Passive range of motion;Dry needling;Energy conservation;Visual/perceptual remediation/compensation   PT Next Visit Plan walking, core, strength LE   PT Home Exercise Plan see sheet   Consulted and Agree with Plan of Care  Patient;Family member/caregiver   Family Member Consulted wife      Patient will benefit from skilled therapeutic intervention  in order to improve the following deficits and impairments:  Abnormal gait, Decreased activity tolerance, Decreased balance, Decreased knowledge of use of DME, Decreased endurance, Decreased mobility, Decreased range of motion, Decreased safety awareness, Decreased strength, Decreased scar mobility, Difficulty walking, Dizziness, Impaired perceived functional ability, Improper body mechanics, Postural dysfunction, Pain  Visit Diagnosis: Acute midline low back pain, with sciatica presence unspecified  Difficulty in walking, not elsewhere classified  Muscle weakness (generalized)     Problem List Patient Active Problem List   Diagnosis Date Noted  . Lymphedema 12/16/2015  . Chronic venous insufficiency 12/16/2015  . Leg swelling 12/16/2015  . 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 Anxiety 05/23/2015  . Acid reflux 05/23/2015  . HBV (hepatitis B virus) infection 05/23/2015  . History of cardiac catheterization 05/23/2015  . Combined fat and carbohydrate induced hyperlipemia 05/23/2015  . Arteriosclerosis of coronary artery 05/23/2015  . Clinical depression 05/23/2015  . Cough 07/26/2014  . Benign essential HTN 07/16/2014  . Breathlessness on exertion 02/12/2014  . Ischemic embolic stroke ([place]) 21/82/8833  . Bundle branch block, right 05/29/2012  . History of knee surgery 05/26/2012   Janna Arch, PT, DPT   Janna Arch 10/15/2016, 11:43 AM  [place] MAIN [facility] SERVICES [address] [place], [place], [address] Phone: [phone]   Fax:  [phone]  Name: Johnny Mccoy MRN: [id number] Date of Birth: 1945/05/06

## 2016-10-19 ENCOUNTER — Ambulatory Visit: Payer: Medicare Other

## 2016-10-19 DIAGNOSIS — M545 Low back pain: Secondary | ICD-10-CM

## 2016-10-19 DIAGNOSIS — R262 Difficulty in walking, not elsewhere classified: Secondary | ICD-10-CM

## 2016-10-19 DIAGNOSIS — M6281 Muscle weakness (generalized): Secondary | ICD-10-CM

## 2016-10-19 NOTE — Therapy (Signed)
[place] MAIN [facility] SERVICES [address] [place], [place], [address] Phone: [phone]   Fax:  [phone]  Physical Therapy Treatment  Patient Details  Name: Johnny Mccoy MRN: [id number] Date of Birth: Jul 10, 1945 Referring Provider: Dr. Launa Grill  Encounter Date: 10/19/2016      PT End of Session - 10/19/16 1256    Visit Number 4   Number of Visits 12   Date for PT Re-Evaluation 11/19/16   Authorization - Visit Number 4   Authorization - Number of Visits 10   PT Start Time 5643   PT Stop Time 1115   PT Time Calculation (min) 45 min   Equipment Utilized During Treatment Gait belt   Activity Tolerance Patient tolerated treatment well;Treatment limited secondary to medical complications (Comment)   Behavior During Therapy [facility] for tasks assessed/performed;Anxious      Past Medical History:  Diagnosis Date  . Anxiety    takes Lorazepam daily  . Arthritis   . Asthma    Albuterol as needed.Breo daily as well as Flovent  . CAD (coronary artery disease)   . Cataract   . Cataracts, bilateral    immature  . Chronic back pain    compression  . Complication of anesthesia    seizures after partial knee in 2014.Surgery was in am and an still in post op until 9 that night. Was sent to [facility] for 5 days d/t uncontrollable shaking,incoherant  . Depressed    takes Pristiq daily  . Essential tremor   . GERD (gastroesophageal reflux disease)    takes Dexilant daily as well as Zantac  . 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 History of bronchitis    Jan-Mar 2017  . History of hiatal hernia   . Hyperlipidemia    takes Atorvastatin daily  . Hypertension    takes HCTZ,Amlodipine,and Metoprolol daily  . Insomnia    takes Trazodone nightly   . Joint pain   . Muscle spasm    in neck.Takes Flexeril as needed  . Numbness    right knee,right hand,and toes on left  . Seizures ([place])    takes Lamotrigine daily. Last seizure 6+ yrs ago     Past Surgical History:  Procedure Laterality Date  . CARDIAC CATHETERIZATION     2015  . CARPAL TUNNEL RELEASE    . COLONOSCOPY    . CORONARY ANGIOPLASTY WITH STENT PLACEMENT     x 3  . dental implants    . ESOPHAGOGASTRODUODENOSCOPY    . EYE SURGERY Bilateral   . EYE SURGERY     both eyes in 60's d/t muscle causing to squint  . HERNIA REPAIR     umbilical  . JOINT REPLACEMENT Right    knee  . KNEE ARTHROSCOPY    . MEDIAL PARTIAL KNEE REPLACEMENT Right   . MEDIASTERNOTOMY N/A 10/28/2015   Procedure: PARTIAL STERNOTOMY;  Surgeon: [name], MD;  Location: [place];  Service: Thoracic;  Laterality: N/A;  . numerous hand surgery    . 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   Procedure: RESECTION OF anterier MEDIASTINAL TUMOR;  Surgeon: [name], MD;  Location: [place];  Service: Thoracic;  Laterality: N/A;  . right hand fusion    . 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 TONSILLECTOMY      There were no vitals filed for this visit.      Subjective Assessment -  10/19/16 1039    Subjective walking without walker as frequently in house. Went to doctor after last visit and was given more oxycodone-which patient is trying to wean self off.    Patient is accompained by: Family member   Pertinent History Pt is a pleasant 71 year old male who presents s/p L4-S1 laminectomy/fusion on 09/23/16 and was discharged yesterday from the hospital. His back pain started in April and he has had a complicated history since then involving multiple falls in May due to overmedication from conflicting medications. Prior to April he was independent with all tasks.  He is having difficulty adapting to home due to having a lot of area rugs that are different heights, running obstacle course from objects in house while in the home. The bed at home is higher then he is used to. Patient wants to return to gardening and is currently on blood thinner for 6 months and is a fall  risk.    Limitations Sitting;Lifting;Standing;Walking;House hold activities;Other (comment)   How long can you sit comfortably? hour   How long can you stand comfortably? 30 minutes   How long can you walk comfortably? 10 minutes   Diagnostic tests surgery   Patient Stated Goals returning to gardening, like to strengthen, feel more confident in ambulation, decrease fear of fall, balance, get back to driving   Currently in Pain? Yes   Pain Score 5    Pain Location Back   Pain Orientation Lower   Pain Descriptors / Indicators Aching;Contraction   Pain Type Surgical pain   Pain Radiating Towards R gluteal region   Pain Onset 1 to 4 weeks ago      TherEx   TA contractions 10x 3-5 second holds, cues for proper muscle recruitment  TA contraction with marches 12x TA contraction with UE raises 10x Ambulating in // bars with QC and occasional opposite UE support. Utilization of QC in L hand. Verbal cues for sequencing. Pt. Experienced dizziness due to difficulty coordinating breathing with sequencing. . Implemented purse lipped breathing Mini squats in // bars 10x  Log roll on and off of bed Side step each direction with slight knee bend Ankle pumps 15x  Hamstring stretch in supine 60 seconds     education on DVT's   Pt. response to medical necessity:  Patient will benefit from skilled physical therapy to improve strength, mobility, ambulation, and decrease fall risk to allow for return to prior level of function.                           PT Education - 10/19/16 1256    Education provided Yes   Education Details ambulating with quad cane   Person(s) Educated Patient   Methods Explanation;Demonstration;Verbal cues   Comprehension Verbalized understanding;Returned demonstration;Verbal cues required          PT Short Term Goals - 10/08/16 1007      PT SHORT TERM GOAL #1   Title Patient will ambulate with cane for 100 ft to allow for more independent  mobilization   Baseline 8/9 walk with walker   Time 2   Period Weeks   Status New   Target Date 10/22/16     PT SHORT TERM GOAL #2   Title Patient will report a worst pain of 4/10 on VAS to improve tolerance with ADLs and reduced symptoms with activities.    Baseline 8/9: pain worst 8/10   Time 2   Period  Weeks   Status New   Target Date 10/22/16     PT SHORT TERM GOAL #3   Title Patient will increase 10 meter walk test to >1.75m/s as to improve gait speed for better community ambulation and to reduce fall risk.   Baseline 8/9: 1.38m/s   Time 2   Period Weeks   Status New   Target Date 10/22/16           PT Long Term Goals - 10/08/16 1011      PT LONG TERM GOAL #1   Title Patient will be independent in home exercise program to improve strength/mobility for better functional independence with ADLs.   Baseline given HEP   Time 6   Period Weeks   Status New   Target Date 11/19/16     PT LONG TERM GOAL #2   Title Patient will decrease FABQ score to < 40/96 to demonstrate improved quality of life.    Baseline 8/9: 66/96   Time 6   Period Weeks   Status New   Target Date 11/19/16     PT LONG TERM GOAL #3   Title Patient will reduce timed up and go to <11 seconds to reduce fall risk and demonstrate improved transfer/gait ability.   Baseline 8/9: 21 seconds   Time 6   Period Weeks   Status New   Target Date 11/19/16     PT LONG TERM GOAL #4   Title Patient will report a worst pain of 3/10 on VAS in   to improve tolerance with ADLs and reduced symptoms with activities.    Baseline 8/9: worst 8-9/10 pain   Time 6   Period Weeks   Status New   Target Date 11/19/16               Plan - 10/19/16 1259    Clinical Impression Statement Patient continues to be fearful of movement due to pain. Very tight hamstrings noted RLE, relieved with prolonged gentle stretch. Ambulating with quad cane initiated with patient demonstrating understanding, requiring constant  verbal cueing. Will continue to progress with quad cane to allow for same mobility within home. Back stabilization progressing with abdominal interventions.   Patient will benefit from skilled physical therapy to improve strength, mobility, ambulation, and decrease fall risk to allow for return to prior level of function.   Rehab Potential Good   Clinical Impairments Affecting Rehab Potential CAD (coronary artery disease) , Hyperlipidemia, mixed , Amnesia , Hepatitis B , GERD (gastroesophageal reflux disease) , Anxiety, unspecified , Depression, unspecified , Hx of cardiac catheterization , Right bundle branch block , Valvular heart disease,  Benign essential hypertension,  Thymus neoplasm , Seizure (CMS-HCC) ,Thrombocytopenia (CMS-HCC) , Spondylolisthesis of lumbar region Asthma, unspecified OSA (obstructive sleep apnea) , Stroke (CMS-HCC), Ischemic embolic stroke (CMS-HCC) , Lymphedema , Meibomian gland dysfunction, Myopia, Presbyopia,  Spinal stenosis , Thymoma S/P lumbar laminectomy   PT Frequency 2x / week   PT Duration 6 weeks   PT Treatment/Interventions ADLs/Self Care Home Management;Cryotherapy;Electrical Stimulation;Iontophoresis 4mg /ml Dexamethasone;Moist Heat;Ultrasound;Functional mobility training;Stair training;Gait training;DME Instruction;Therapeutic activities;Therapeutic exercise;Balance training;Neuromuscular re-education;Manual techniques;Patient/family education;Scar mobilization;Passive range of motion;Dry needling;Energy conservation;Visual/perceptual remediation/compensation   PT Next Visit Plan estim?   PT Home Exercise Plan see sheet   Consulted and Agree with Plan of Care Patient;Family member/caregiver   Family Member Consulted wife      Patient will benefit from skilled therapeutic intervention in order to improve the following deficits and impairments:  Abnormal gait, Decreased activity  tolerance, Decreased balance, Decreased knowledge of use of DME, Decreased endurance,  Decreased mobility, Decreased range of motion, Decreased safety awareness, Decreased strength, Decreased scar mobility, Difficulty walking, Dizziness, Impaired perceived functional ability, Improper body mechanics, Postural dysfunction, Pain  Visit Diagnosis: Acute midline low back pain, with sciatica presence unspecified  Difficulty in walking, not elsewhere classified  Muscle weakness (generalized)     Problem List Patient Active Problem List   Diagnosis Date Noted  . Lymphedema 12/16/2015  . Chronic venous insufficiency 12/16/2015  . Leg swelling 12/16/2015  . 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 Anxiety 05/23/2015  . Acid reflux 05/23/2015  . HBV (hepatitis B virus) infection 05/23/2015  . History of cardiac catheterization 05/23/2015  . Combined fat and carbohydrate induced hyperlipemia 05/23/2015  . Arteriosclerosis of coronary artery 05/23/2015  . Clinical depression 05/23/2015  . Cough 07/26/2014  . Benign essential HTN 07/16/2014  . Breathlessness on exertion 02/12/2014  . Ischemic embolic stroke ([place]) 37/85/8850  . Bundle branch block, right 05/29/2012  . History of knee surgery 05/26/2012  Janna Arch, PT, DPT    Janna Arch 10/19/2016, 1:00 PM  [place] MAIN [facility] SERVICES [address] [place], [place], [address] Phone: [phone]   Fax:  [phone]  Name: Johnny Mccoy MRN: [id number] Date of Birth: 02-Feb-1946

## 2016-10-21 ENCOUNTER — Ambulatory Visit: Payer: Medicare Other

## 2016-10-21 DIAGNOSIS — M545 Low back pain: Secondary | ICD-10-CM

## 2016-10-21 DIAGNOSIS — M6281 Muscle weakness (generalized): Secondary | ICD-10-CM

## 2016-10-21 DIAGNOSIS — R262 Difficulty in walking, not elsewhere classified: Secondary | ICD-10-CM

## 2016-10-21 NOTE — Therapy (Signed)
[facility] Mccoy [facility] SERVICES [address] [place], [place], [address] Phone: [phone]   Fax:  [phone]  Physical Therapy Treatment  Patient Details  Name: Johnny Mccoy MRN: [id number] Date of Birth: 1945/03/12 Referring Provider: Dr. Launa Grill  Encounter Date: 10/21/2016      PT End of Session - 10/21/16 0944    Visit Number 5   Number of Visits 12   Date for PT Re-Evaluation 11/19/16   Authorization - Visit Number 5   Authorization - Number of Visits 10   PT Start Time 0929   PT Stop Time 1015   PT Time Calculation (min) 46 min   Equipment Utilized During Treatment Gait belt   Activity Tolerance Patient tolerated treatment well;Treatment limited secondary to medical complications (Comment)   Behavior During Therapy [facility] for tasks assessed/performed;Anxious      Past Medical History:  Diagnosis Date  . Anxiety    takes Lorazepam daily  . Arthritis   . Asthma    Albuterol as needed.Breo daily as well as Flovent  . CAD (coronary artery disease)   . Cataract   . Cataracts, bilateral    immature  . Chronic back pain    compression  . Complication of anesthesia    seizures after partial knee in 2014.Surgery was in am and an still in post op until 9 that night. Was sent to [facility] for 5 days d/t uncontrollable shaking,incoherant  . Depressed    takes Pristiq daily  . Essential tremor   . GERD (gastroesophageal reflux disease)    takes Dexilant daily as well as Zantac  . 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 History of bronchitis    Jan-Mar 2017  . History of hiatal hernia   . Hyperlipidemia    takes Atorvastatin daily  . Hypertension    takes HCTZ,Amlodipine,and Metoprolol daily  . Insomnia    takes Trazodone nightly   . Joint pain   . Muscle spasm    in neck.Takes Flexeril as needed  . Numbness    right knee,right hand,and toes on left  . Seizures ([place])    takes Lamotrigine daily. Last seizure 6+ yrs ago     Past Surgical History:  Procedure Laterality Date  . CARDIAC CATHETERIZATION     2015  . CARPAL TUNNEL RELEASE    . COLONOSCOPY    . CORONARY ANGIOPLASTY WITH STENT PLACEMENT     x 3  . dental implants    . ESOPHAGOGASTRODUODENOSCOPY    . EYE SURGERY Bilateral   . EYE SURGERY     both eyes in 60's d/t muscle causing to squint  . HERNIA REPAIR     umbilical  . JOINT REPLACEMENT Right    knee  . KNEE ARTHROSCOPY    . MEDIAL PARTIAL KNEE REPLACEMENT Right   . MEDIASTERNOTOMY N/A 10/28/2015   Procedure: PARTIAL STERNOTOMY;  Surgeon: [name], MD;  Location: [place];  Service: Thoracic;  Laterality: N/A;  . numerous hand surgery    . 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   Procedure: RESECTION OF anterier MEDIASTINAL TUMOR;  Surgeon: [name], MD;  Location: [place];  Service: Thoracic;  Laterality: N/A;  . right hand fusion    . 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 TONSILLECTOMY      There were no vitals filed for this visit. TherEx     Subjective Assessment -  10/21/16 0932    Subjective Tried going off of pain medication for 24 hours monday, realized still needed it tuesday.  Enjoying walking, walking full circle morning and afternoon, got sled bottoms onto walker   Patient is accompained by: Family member   Pertinent History Pt is a pleasant 71 year old male who presents s/p L4-S1 laminectomy/fusion on 09/23/16 and was discharged yesterday from the hospital. His back pain started in April and he has had a complicated history since then involving multiple falls in May due to overmedication from conflicting medications. Prior to April he was independent with all tasks.  He is having difficulty adapting to home due to having a lot of area rugs that are different heights, running obstacle course from objects in house while in the home. The bed at home is higher then he is used to. Patient wants to return to gardening and is currently on blood  thinner for 6 months and is a fall risk.    Limitations Sitting;Lifting;Standing;Walking;House hold activities;Other (comment)   How long can you sit comfortably? hour   How long can you stand comfortably? 30 minutes   How long can you walk comfortably? 10 minutes   Diagnostic tests surgery   Patient Stated Goals returning to gardening, like to strengthen, feel more confident in ambulation, decrease fear of fall, balance, get back to driving   Currently in Pain? Yes   Pain Score 2    Pain Location Back   Pain Orientation Lower   Pain Descriptors / Indicators Aching   Pain Type Surgical pain   Pain Onset 1 to 4 weeks ago   Pain Frequency Constant     TA contractions 10x 5 second holds Quad set 15x 2 Gluteal sets 12x5 second holds  Straight leg raise 15x 2 Bend knee fallout Supine arm overhead 10x each arm with TA contraction  Short arc quad 15x each leg over bolster Ankle pumps Pillow squeezes 15x  Seated hip abduction RTB 20x   Ice provided to lumbar region during seated interventions  Ambulating inside and outside of // bars with quad cane in left hand. Occasional verbal cueing for sequencing.   All exercises performed on R and l legs   Pt. response to medical necessity: Patient will benefit from skilled physical therapy to improve strength, mobility, ambulation, and decrease fall risk to allow for return to prior level of function.                           PT Education - 10/21/16 1156    Education provided Yes   Education Details quad cane ambulation   Person(s) Educated Patient   Methods Explanation;Demonstration;Verbal cues   Comprehension Verbalized understanding;Returned demonstration;Verbal cues required          PT Short Term Goals - 10/21/16 1157      PT SHORT TERM GOAL #1   Title Patient will ambulate with cane for 100 ft to allow for more independent mobilization   Baseline 8/9 walk with walker 8/22: 30 ft with quad cane   Time 2    Period Weeks   Status Partially Met   Target Date 11/04/16     PT SHORT TERM GOAL #2   Title Patient will report a worst pain of 4/10 on VAS to improve tolerance with ADLs and reduced symptoms with activities.    Baseline 8/9: pain worst 8/10 8/22 worst pain 7/10   Time 2   Period Weeks  Status Partially Met   Target Date 11/04/16     PT SHORT TERM GOAL #3   Title Patient will increase 10 meter walk test to >1.76ms as to improve gait speed for better community ambulation and to reduce fall risk.   Baseline 8/9: 1.063m   Time 2   Period Weeks   Status On-going           PT Long Term Goals - 10/08/16 1011      PT LONG TERM GOAL #1   Title Patient will be independent in home exercise program to improve strength/mobility for better functional independence with ADLs.   Baseline given HEP   Time 6   Period Weeks   Status New   Target Date 11/19/16     PT LONG TERM GOAL #2   Title Patient will decrease FABQ score to < 40/96 to demonstrate improved quality of life.    Baseline 8/9: 66/96   Time 6   Period Weeks   Status New   Target Date 11/19/16     PT LONG TERM GOAL #3   Title Patient will reduce timed up and go to <11 seconds to reduce fall risk and demonstrate improved transfer/gait ability.   Baseline 8/9: 21 seconds   Time 6   Period Weeks   Status New   Target Date 11/19/16     PT LONG TERM GOAL #4   Title Patient will report a worst pain of 3/10 on VAS in   to improve tolerance with ADLs and reduced symptoms with activities.    Baseline 8/9: worst 8-9/10 pain   Time 6   Period Weeks   Status New   Target Date 11/19/16               Plan - 10/21/16 1156    Clinical Impression Statement  Patient improved ambulatory independence, ambulating with quad cane outside of // bars for first time today requiring occasional cues for cane sequencing with no LOB and good uptight posture. Supine and seated strengthening interventions performed with no increase  in pain. Patient will benefit from skilled physical therapy to improve strength, mobility, ambulation, and decrease fall risk to allow for return to prior level of function.    Rehab Potential Good   Clinical Impairments Affecting Rehab Potential CAD (coronary artery disease) , Hyperlipidemia, mixed , Amnesia , Hepatitis B , GERD (gastroesophageal reflux disease) , Anxiety, unspecified , Depression, unspecified , Hx of cardiac catheterization , Right bundle branch block , Valvular heart disease,  Benign essential hypertension,  Thymus neoplasm , Seizure (CMS-HCC) ,Thrombocytopenia (CMS-HCC) , Spondylolisthesis of lumbar region Asthma, unspecified OSA (obstructive sleep apnea) , Stroke (CMS-HCC), Ischemic embolic stroke (CMS-HCC) , Lymphedema , Meibomian gland dysfunction, Myopia, Presbyopia,  Spinal stenosis , Thymoma S/P lumbar laminectomy   PT Frequency 2x / week   PT Duration 6 weeks   PT Treatment/Interventions ADLs/Self Care Home Management;Cryotherapy;Electrical Stimulation;Iontophoresis 25m325ml Dexamethasone;Moist Heat;Ultrasound;Functional mobility training;Stair training;Gait training;DME Instruction;Therapeutic activities;Therapeutic exercise;Balance training;Neuromuscular re-education;Manual techniques;Patient/family education;Scar mobilization;Passive range of motion;Dry needling;Energy conservation;Visual/perceptual remediation/compensation   PT Next Visit Plan quad cane   PT Home Exercise Plan see sheet   Consulted and Agree with Plan of Care Patient;Family member/caregiver   Family Member Consulted wife      Patient will benefit from skilled therapeutic intervention in order to improve the following deficits and impairments:  Abnormal gait, Decreased activity tolerance, Decreased balance, Decreased knowledge of use of DME, Decreased endurance, Decreased mobility, Decreased range of motion, Decreased safety  awareness, Decreased strength, Decreased scar mobility, Difficulty walking,  Dizziness, Impaired perceived functional ability, Improper body mechanics, Postural dysfunction, Pain  Visit Diagnosis: Acute midline low back pain, with sciatica presence unspecified  Difficulty in walking, not elsewhere classified  Muscle weakness (generalized)     Problem List Patient Active Problem List   Diagnosis Date Noted  . Lymphedema 12/16/2015  . Chronic venous insufficiency 12/16/2015  . Leg swelling 12/16/2015  . 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 Anxiety 05/23/2015  . Acid reflux 05/23/2015  . HBV (hepatitis B virus) infection 05/23/2015  . History of cardiac catheterization 05/23/2015  . Combined fat and carbohydrate induced hyperlipemia 05/23/2015  . Arteriosclerosis of coronary artery 05/23/2015  . Clinical depression 05/23/2015  . Cough 07/26/2014  . Benign essential HTN 07/16/2014  . Breathlessness on exertion 02/12/2014  . Ischemic embolic stroke ([place]) 30/85/6943  . Bundle branch block, right 05/29/2012  . History of knee surgery 05/26/2012   Janna Arch, PT, DPT   Janna Arch 10/21/2016, 11:58 AM  [place] [facility] SERVICES [address] [place], [place], [address] Phone: [phone]   Fax:  [phone]  Name: Johnny Mccoy MRN: [id number] Date of Birth: [birth year]

## 2016-10-26 ENCOUNTER — Ambulatory Visit: Payer: Medicare Other

## 2016-10-26 DIAGNOSIS — R262 Difficulty in walking, not elsewhere classified: Secondary | ICD-10-CM

## 2016-10-26 DIAGNOSIS — M545 Low back pain: Secondary | ICD-10-CM | POA: Diagnosis not present

## 2016-10-26 DIAGNOSIS — M6281 Muscle weakness (generalized): Secondary | ICD-10-CM

## 2016-10-26 NOTE — Therapy (Signed)
[place] MAIN [facility] SERVICES [address] [place], [place], [address] Phone: [phone]   Fax:  [phone]  Physical Therapy Treatment  Patient Details  Name: Johnny Mccoy MRN: [id number] Date of Birth: [birth year] Referring Provider: Dr. Launa Grill  Encounter Date: 10/26/2016      PT End of Session - 10/26/16 0810    Visit Number 5   Number of Visits 12   Date for PT Re-Evaluation 11/19/16   Authorization - Visit Number 5   Authorization - Number of Visits 10   PT Start Time 0800   PT Stop Time 0846   PT Time Calculation (min) 46 min   Equipment Utilized During Treatment Gait belt   Activity Tolerance Patient tolerated treatment well;Treatment limited secondary to medical complications (Comment)   Behavior During Therapy [facility] for tasks assessed/performed;Anxious      Past Medical History:  Diagnosis Date  . Anxiety    takes Lorazepam daily  . Arthritis   . Asthma    Albuterol as needed.Breo daily as well as Flovent  . CAD (coronary artery disease)   . Cataract   . Cataracts, bilateral    immature  . Chronic back pain    compression  . Complication of anesthesia    seizures after partial knee in 2014.Surgery was in am and an still in post op until 9 that night. Was sent to [facility] for 5 days d/t uncontrollable shaking,incoherant  . Depressed    takes Pristiq daily  . Essential tremor   . GERD (gastroesophageal reflux disease)    takes Dexilant daily as well as Zantac  . 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 History of bronchitis    Jan-Mar 2017  . History of hiatal hernia   . Hyperlipidemia    takes Atorvastatin daily  . Hypertension    takes HCTZ,Amlodipine,and Metoprolol daily  . Insomnia    takes Trazodone nightly   . Joint pain   . Muscle spasm    in neck.Takes Flexeril as needed  . Numbness    right knee,right hand,and toes on left  . Seizures ([place])    takes Lamotrigine daily. Last seizure 6+ yrs ago     Past Surgical History:  Procedure Laterality Date  . CARDIAC CATHETERIZATION     2015  . CARPAL TUNNEL RELEASE    . COLONOSCOPY    . CORONARY ANGIOPLASTY WITH STENT PLACEMENT     x 3  . dental implants    . ESOPHAGOGASTRODUODENOSCOPY    . EYE SURGERY Bilateral   . EYE SURGERY     both eyes in 60's d/t muscle causing to squint  . HERNIA REPAIR     umbilical  . JOINT REPLACEMENT Right    knee  . KNEE ARTHROSCOPY    . MEDIAL PARTIAL KNEE REPLACEMENT Right   . MEDIASTERNOTOMY N/A 10/28/2015   Procedure: PARTIAL STERNOTOMY;  Surgeon: [name], MD;  Location: [place];  Service: Thoracic;  Laterality: N/A;  . numerous hand surgery    . 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   Procedure: RESECTION OF anterier MEDIASTINAL TUMOR;  Surgeon: [name], MD;  Location: [place];  Service: Thoracic;  Laterality: N/A;  . right hand fusion    . 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 TONSILLECTOMY      There were no vitals filed for this visit.      Subjective Assessment -  10/26/16 0804    Subjective Patient started using a quad cane since last Thursday. Still uses walker for longer walks. Overdid it on Saturday because he and his wife walked hills.    Patient is accompained by: Family member   Pertinent History Pt is a pleasant 71 year old male who presents s/p L4-S1 laminectomy/fusion on 09/23/16 and was discharged yesterday from the hospital. His back pain started in April and he has had a complicated history since then involving multiple falls in May due to overmedication from conflicting medications. Prior to April he was independent with all tasks.  He is having difficulty adapting to home due to having a lot of area rugs that are different heights, running obstacle course from objects in house while in the home. The bed at home is higher then he is used to. Patient wants to return to gardening and is currently on blood thinner for 6 months and is a fall  risk.    Limitations Sitting;Lifting;Standing;Walking;House hold activities;Other (comment)   How long can you sit comfortably? hour   How long can you stand comfortably? 30 minutes   How long can you walk comfortably? 10 minutes   Diagnostic tests surgery   Patient Stated Goals returning to gardening, like to strengthen, feel more confident in ambulation, decrease fear of fall, balance, get back to driving   Currently in Pain? Yes   Pain Score 4    Pain Location Back   Pain Orientation Lower   Pain Descriptors / Indicators Aching   Pain Type Surgical pain   Pain Onset 1 to 4 weeks ago     TA contractions 10x 5 second holds Quad set 15x 2 Gluteal sets 12x5 second holds in seated Straight leg raise 15x 2 Supine arm overhead 10x with pipe with TA contraction  Short arc quad 15x each leg over bolster Ankle pumps Pillow squeezes 20x  Seated hip abduction GTB 16x    Ice provided to lumbar region during seated interventions   Ambulating in hallway with Quad cane: 6x100 ft. 2x100 ft of L and R head turns, difficulty turning head to R with LOB. 2x100 of Up and down head turns. 2x10 of L, R, Up, Down, or wave to allow for integration of ambulating in community.   All exercises performed on R and l legs  Pt. response to medical necessity:  Patient will benefit from skilled physical therapy to improve strength, mobility, ambulation, and decrease fall risk to allow for return to prior level of function.           PT Education - 10/26/16 0810    Education provided Yes   Education Details quad cane education and sequencing with ambulation   Person(s) Educated Patient   Methods Explanation;Demonstration;Verbal cues   Comprehension Verbalized understanding;Returned demonstration          PT Short Term Goals - 10/21/16 1157      PT SHORT TERM GOAL #1   Title Patient will ambulate with cane for 100 ft to allow for more independent mobilization   Baseline 8/9 walk with walker 8/22:  30 ft with quad cane   Time 2   Period Weeks   Status Partially Met   Target Date 11/04/16     PT SHORT TERM GOAL #2   Title Patient will report a worst pain of 4/10 on VAS to improve tolerance with ADLs and reduced symptoms with activities.    Baseline 8/9: pain worst 8/10 8/22 worst pain 7/10  Time 2   Period Weeks   Status Partially Met   Target Date 11/04/16     PT SHORT TERM GOAL #3   Title Patient will increase 10 meter walk test to >1.51ms as to improve gait speed for better community ambulation and to reduce fall risk.   Baseline 8/9: 1.062m   Time 2   Period Weeks   Status On-going           PT Long Term Goals - 10/08/16 1011      PT LONG TERM GOAL #1   Title Patient will be independent in home exercise program to improve strength/mobility for better functional independence with ADLs.   Baseline given HEP   Time 6   Period Weeks   Status New   Target Date 11/19/16     PT LONG TERM GOAL #2   Title Patient will decrease FABQ score to < 40/96 to demonstrate improved quality of life.    Baseline 8/9: 66/96   Time 6   Period Weeks   Status New   Target Date 11/19/16     PT LONG TERM GOAL #3   Title Patient will reduce timed up and go to <11 seconds to reduce fall risk and demonstrate improved transfer/gait ability.   Baseline 8/9: 21 seconds   Time 6   Period Weeks   Status New   Target Date 11/19/16     PT LONG TERM GOAL #4   Title Patient will report a worst pain of 3/10 on VAS in   to improve tolerance with ADLs and reduced symptoms with activities.    Baseline 8/9: worst 8-9/10 pain   Time 6   Period Weeks   Status New   Target Date 11/19/16               Plan - 10/26/16 0856    Clinical Impression Statement  Patient progressing to ambulation with quad cane at home and in community. Requires cueing for safety with R head turn due to occasional LOB from holding breath and relieving grip upon cane. Patient continues to be limited by  postsurgical back pain at this time.  Patient will benefit from skilled physical therapy to improve strength, mobility, ambulation, and decrease fall risk to allow for return to prior level of function.   Rehab Potential Good   Clinical Impairments Affecting Rehab Potential CAD (coronary artery disease) , Hyperlipidemia, mixed , Amnesia , Hepatitis B , GERD (gastroesophageal reflux disease) , Anxiety, unspecified , Depression, unspecified , Hx of cardiac catheterization , Right bundle branch block , Valvular heart disease,  Benign essential hypertension,  Thymus neoplasm , Seizure (CMS-HCC) ,Thrombocytopenia (CMS-HCC) , Spondylolisthesis of lumbar region Asthma, unspecified OSA (obstructive sleep apnea) , Stroke (CMS-HCC), Ischemic embolic stroke (CMS-HCC) , Lymphedema , Meibomian gland dysfunction, Myopia, Presbyopia,  Spinal stenosis , Thymoma S/P lumbar laminectomy   PT Frequency 2x / week   PT Duration 6 weeks   PT Treatment/Interventions ADLs/Self Care Home Management;Cryotherapy;Electrical Stimulation;Iontophoresis 58m7ml Dexamethasone;Moist Heat;Ultrasound;Functional mobility training;Stair training;Gait training;DME Instruction;Therapeutic activities;Therapeutic exercise;Balance training;Neuromuscular re-education;Manual techniques;Patient/family education;Scar mobilization;Passive range of motion;Dry needling;Energy conservation;Visual/perceptual remediation/compensation   PT Next Visit Plan quad cane   PT Home Exercise Plan see sheet   Consulted and Agree with Plan of Care Patient;Family member/caregiver   Family Member Consulted wife      Patient will benefit from skilled therapeutic intervention in order to improve the following deficits and impairments:  Abnormal gait, Decreased activity tolerance, Decreased balance, Decreased knowledge of use  of DME, Decreased endurance, Decreased mobility, Decreased range of motion, Decreased safety awareness, Decreased strength, Decreased scar mobility,  Difficulty walking, Dizziness, Impaired perceived functional ability, Improper body mechanics, Postural dysfunction, Pain  Visit Diagnosis: Acute midline low back pain, with sciatica presence unspecified  Difficulty in walking, not elsewhere classified  Muscle weakness (generalized)     Problem List Patient Active Problem List   Diagnosis Date Noted  . Lymphedema 12/16/2015  . Chronic venous insufficiency 12/16/2015  . Leg swelling 12/16/2015  . 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 Anxiety 05/23/2015  . Acid reflux 05/23/2015  . HBV (hepatitis B virus) infection 05/23/2015  . History of cardiac catheterization 05/23/2015  . Combined fat and carbohydrate induced hyperlipemia 05/23/2015  . Arteriosclerosis of coronary artery 05/23/2015  . Clinical depression 05/23/2015  . Cough 07/26/2014  . Benign essential HTN 07/16/2014  . Breathlessness on exertion 02/12/2014  . Ischemic embolic stroke ([place]) 83/38/2505  . Bundle branch block, right 05/29/2012  . History of knee surgery 05/26/2012  Janna Arch, PT, DPT    Janna Arch 10/26/2016, 8:58 AM  [place] MAIN [facility] SERVICES [address] [place], [place], [address] Phone: [phone]   Fax:  [phone]  Name: Johnny Mccoy MRN: [id number] Date of Birth: [birth year]

## 2016-10-28 ENCOUNTER — Ambulatory Visit: Payer: Medicare Other

## 2016-10-28 DIAGNOSIS — M545 Low back pain: Secondary | ICD-10-CM

## 2016-10-28 DIAGNOSIS — M6281 Muscle weakness (generalized): Secondary | ICD-10-CM

## 2016-10-28 DIAGNOSIS — R262 Difficulty in walking, not elsewhere classified: Secondary | ICD-10-CM

## 2016-10-28 NOTE — Therapy (Signed)
[place] MAIN [facility] SERVICES [address] [place], [place], [address] Phone: [phone]   Fax:  [phone]  Physical Therapy Treatment  Patient Details  Name: Johnny Mccoy MRN: [id number] Date of Birth: 1945-06-13 Referring Provider: Dr. Launa Grill  Encounter Date: 10/28/2016      PT End of Session - 10/28/16 0937    Visit Number 6   Number of Visits 12   Date for PT Re-Evaluation 11/19/16   Authorization - Visit Number 6   Authorization - Number of Visits 10   PT Start Time 0930   PT Stop Time 1015   PT Time Calculation (min) 45 min   Equipment Utilized During Treatment Gait belt   Activity Tolerance Patient tolerated treatment well;Treatment limited secondary to medical complications (Comment)   Behavior During Therapy [facility] for tasks assessed/performed;Anxious      Past Medical History:  Diagnosis Date  . Anxiety    takes Lorazepam daily  . Arthritis   . Asthma    Albuterol as needed.Breo daily as well as Flovent  . CAD (coronary artery disease)   . Cataract   . Cataracts, bilateral    immature  . Chronic back pain    compression  . Complication of anesthesia    seizures after partial knee in 2014.Surgery was in am and an still in post op until 9 that night. Was sent to [facility] for 5 days d/t uncontrollable shaking,incoherant  . Depressed    takes Pristiq daily  . Essential tremor   . GERD (gastroesophageal reflux disease)    takes Dexilant daily as well as Zantac  . 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 History of bronchitis    Jan-Mar 2017  . History of hiatal hernia   . Hyperlipidemia    takes Atorvastatin daily  . Hypertension    takes HCTZ,Amlodipine,and Metoprolol daily  . Insomnia    takes Trazodone nightly   . Joint pain   . Muscle spasm    in neck.Takes Flexeril as needed  . Numbness    right knee,right hand,and toes on left  . Seizures ([place])    takes Lamotrigine daily. Last seizure 6+ yrs ago     Past Surgical History:  Procedure Laterality Date  . CARDIAC CATHETERIZATION     2015  . CARPAL TUNNEL RELEASE    . COLONOSCOPY    . CORONARY ANGIOPLASTY WITH STENT PLACEMENT     x 3  . dental implants    . ESOPHAGOGASTRODUODENOSCOPY    . EYE SURGERY Bilateral   . EYE SURGERY     both eyes in 60's d/t muscle causing to squint  . HERNIA REPAIR     umbilical  . JOINT REPLACEMENT Right    knee  . KNEE ARTHROSCOPY    . MEDIAL PARTIAL KNEE REPLACEMENT Right   . MEDIASTERNOTOMY N/A 10/28/2015   Procedure: PARTIAL STERNOTOMY;  Surgeon: [name], MD;  Location: [place];  Service: Thoracic;  Laterality: N/A;  . numerous hand surgery    . 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   Procedure: RESECTION OF anterier MEDIASTINAL TUMOR;  Surgeon: [name], MD;  Location: [place];  Service: Thoracic;  Laterality: N/A;  . right hand fusion    . 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 TONSILLECTOMY      There were no vitals filed for this visit.      Subjective Assessment -  10/28/16 0935    Subjective Patient started having increased back pain that radiates to leg. Reports bending over once.    Patient is accompained by: Family member   Pertinent History Pt is a pleasant 71 year old male who presents s/p L4-S1 laminectomy/fusion on 09/23/16 and was discharged yesterday from the hospital. His back pain started in April and he has had a complicated history since then involving multiple falls in May due to overmedication from conflicting medications. Prior to April he was independent with all tasks.  He is having difficulty adapting to home due to having a lot of area rugs that are different heights, running obstacle course from objects in house while in the home. The bed at home is higher then he is used to. Patient wants to return to gardening and is currently on blood thinner for 6 months and is a fall risk.    Limitations  Sitting;Lifting;Standing;Walking;House hold activities;Other (comment)   How long can you sit comfortably? hour   How long can you stand comfortably? 30 minutes   How long can you walk comfortably? 10 minutes   Diagnostic tests surgery   Patient Stated Goals returning to gardening, like to strengthen, feel more confident in ambulation, decrease fear of fall, balance, get back to driving   Currently in Pain? Yes   Pain Score 5    Pain Location Back   Pain Orientation Lower   Pain Descriptors / Indicators Aching;Radiating   Pain Type Surgical pain   Pain Radiating Towards R gluteal region   Pain Onset 1 to 4 weeks ago   Pain Frequency Constant      TA contractions 10x 5 second holds TA contraction with UE raises with PVC pipe Heel slides 10x each leg Quad set 15x 2, with 5 second holds  Gluteal sets 15x5 second holds  Straight leg raise- unable to perform due to pain Bend knee fallout Supine arm overhead 10x each arm with TA contraction  Short arc quad 20x each leg over bolster Ankle pumps Pillow squeezes 15x  Seated hip abduction RTB 20x  Seated marches 15x RTB around legs Side step RTB around ankles seated 15x each leg    Ice provided to lumbar region during seated interventions   Ambulating inside and outside of // bars with quad cane in left hand. Occasional verbal cueing for sequencing.    All exercises performed on R and l legs  Pt. response to medical necessity:  Patient will benefit from skilled physical therapy to improve strength, mobility, ambulation, and decrease fall risk to allow for return to prior level of function.    .                        PT Education - 10/28/16 [phone]    Education provided Yes   Education Details compliance with lefting, bending, twisting precaution   Person(s) Educated Patient   Methods Explanation;Demonstration   Comprehension Verbalized understanding;Returned demonstration          PT Short Term Goals -  10/21/16 1157      PT SHORT TERM GOAL #1   Title Patient will ambulate with cane for 100 ft to allow for more independent mobilization   Baseline 8/9 walk with walker 8/22: 30 ft with quad cane   Time 2   Period Weeks   Status Partially Met   Target Date 11/04/16     PT SHORT TERM GOAL #2   Title Patient will report  a worst pain of 4/10 on VAS to improve tolerance with ADLs and reduced symptoms with activities.    Baseline 8/9: pain worst 8/10 8/22 worst pain 7/10   Time 2   Period Weeks   Status Partially Met   Target Date 11/04/16     PT SHORT TERM GOAL #3   Title Patient will increase 10 meter walk test to >1.7ms as to improve gait speed for better community ambulation and to reduce fall risk.   Baseline 8/9: 1.052m   Time 2   Period Weeks   Status On-going           PT Long Term Goals - 10/08/16 1011      PT LONG TERM GOAL #1   Title Patient will be independent in home exercise program to improve strength/mobility for better functional independence with ADLs.   Baseline given HEP   Time 6   Period Weeks   Status New   Target Date 11/19/16     PT LONG TERM GOAL #2   Title Patient will decrease FABQ score to < 40/96 to demonstrate improved quality of life.    Baseline 8/9: 66/96   Time 6   Period Weeks   Status New   Target Date 11/19/16     PT LONG TERM GOAL #3   Title Patient will reduce timed up and go to <11 seconds to reduce fall risk and demonstrate improved transfer/gait ability.   Baseline 8/9: 21 seconds   Time 6   Period Weeks   Status New   Target Date 11/19/16     PT LONG TERM GOAL #4   Title Patient will report a worst pain of 3/10 on VAS in   to improve tolerance with ADLs and reduced symptoms with activities.    Baseline 8/9: worst 8-9/10 pain   Time 6   Period Weeks   Status New   Target Date 11/19/16               Plan - 10/28/16 2018    Clinical Impression Statement Patient improving in ability to maintain abdominal  contraction with utilizing compensatory musculature. LE strength continues to progress with supine low impact strengthening interventions. Education on back precautions provided due to patient confusion on weight restriction. Patient will benefit from skilled physical therapy to improve strength, mobility, ambulation, and decrease fall risk to allow for return to prior level of function   Rehab Potential Good   Clinical Impairments Affecting Rehab Potential CAD (coronary artery disease) , Hyperlipidemia, mixed , Amnesia , Hepatitis B , GERD (gastroesophageal reflux disease) , Anxiety, unspecified , Depression, unspecified , Hx of cardiac catheterization , Right bundle branch block , Valvular heart disease,  Benign essential hypertension,  Thymus neoplasm , Seizure (CMS-HCC) ,Thrombocytopenia (CMS-HCC) , Spondylolisthesis of lumbar region Asthma, unspecified OSA (obstructive sleep apnea) , Stroke (CMS-HCC), Ischemic embolic stroke (CMS-HCC) , Lymphedema , Meibomian gland dysfunction, Myopia, Presbyopia,  Spinal stenosis , Thymoma S/P lumbar laminectomy   PT Frequency 2x / week   PT Duration 6 weeks   PT Treatment/Interventions ADLs/Self Care Home Management;Cryotherapy;Electrical Stimulation;Iontophoresis 30m90ml Dexamethasone;Moist Heat;Ultrasound;Functional mobility training;Stair training;Gait training;DME Instruction;Therapeutic activities;Therapeutic exercise;Balance training;Neuromuscular re-education;Manual techniques;Patient/family education;Scar mobilization;Passive range of motion;Dry needling;Energy conservation;Visual/perceptual remediation/compensation   PT Next Visit Plan quad cane   PT Home Exercise Plan see sheet   Consulted and Agree with Plan of Care Patient;Family member/caregiver   Family Member Consulted wife      Patient will benefit from skilled therapeutic intervention in  order to improve the following deficits and impairments:  Abnormal gait, Decreased activity tolerance,  Decreased balance, Decreased knowledge of use of DME, Decreased endurance, Decreased mobility, Decreased range of motion, Decreased safety awareness, Decreased strength, Decreased scar mobility, Difficulty walking, Dizziness, Impaired perceived functional ability, Improper body mechanics, Postural dysfunction, Pain  Visit Diagnosis: Acute midline low back pain, with sciatica presence unspecified  Difficulty in walking, not elsewhere classified  Muscle weakness (generalized)     Problem List Patient Active Problem List   Diagnosis Date Noted  . Lymphedema 12/16/2015  . Chronic venous insufficiency 12/16/2015  . Leg swelling 12/16/2015  . 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 Anxiety 05/23/2015  . Acid reflux 05/23/2015  . HBV (hepatitis B virus) infection 05/23/2015  . History of cardiac catheterization 05/23/2015  . Combined fat and carbohydrate induced hyperlipemia 05/23/2015  . Arteriosclerosis of coronary artery 05/23/2015  . Clinical depression 05/23/2015  . Cough 07/26/2014  . Benign essential HTN 07/16/2014  . Breathlessness on exertion 02/12/2014  . Ischemic embolic stroke ([place]) 66/48/3032  . Bundle branch block, right 05/29/2012  . History of knee surgery 05/26/2012  Janna Arch, PT, DPT    Janna Arch 10/28/2016, 8:19 PM  [place] MAIN [facility] SERVICES [address] [place], [place], [address] Phone: [phone]   Fax:  [phone]  Name: Johnny Mccoy MRN: [id number] Date of Birth: May 26, 1945

## 2016-11-04 ENCOUNTER — Ambulatory Visit: Payer: Medicare Other | Attending: Physical Medicine and Rehabilitation

## 2016-11-04 DIAGNOSIS — M6281 Muscle weakness (generalized): Secondary | ICD-10-CM

## 2016-11-04 DIAGNOSIS — R262 Difficulty in walking, not elsewhere classified: Secondary | ICD-10-CM | POA: Diagnosis present

## 2016-11-04 DIAGNOSIS — M545 Low back pain: Secondary | ICD-10-CM | POA: Diagnosis not present

## 2016-11-04 NOTE — Therapy (Signed)
[place] MAIN [facility] SERVICES [address] [place], [place], [address] Phone: [phone]   Fax:  [phone]  Physical Therapy Treatment  Patient Details  Name: Johnny Mccoy MRN: [id number] Date of Birth: 30-Dec-1945 Referring Provider: Dr. Launa Grill  Encounter Date: 11/04/2016      PT End of Session - 11/04/16 0945    Visit Number 7   Number of Visits 12   Date for PT Re-Evaluation 11/19/16   Authorization - Visit Number 7   Authorization - Number of Visits 10   PT Start Time 0930   PT Stop Time 1015   PT Time Calculation (min) 45 min   Equipment Utilized During Treatment Gait belt   Activity Tolerance Patient tolerated treatment well;Treatment limited secondary to medical complications (Comment)   Behavior During Therapy [facility] for tasks assessed/performed      Past Medical History:  Diagnosis Date  . Anxiety    takes Lorazepam daily  . Arthritis   . Asthma    Albuterol as needed.Breo daily as well as Flovent  . CAD (coronary artery disease)   . Cataract   . Cataracts, bilateral    immature  . Chronic back pain    compression  . Complication of anesthesia    seizures after partial knee in 2014.Surgery was in am and an still in post op until 9 that night. Was sent to [facility] for 5 days d/t uncontrollable shaking,incoherant  . Depressed    takes Pristiq daily  . Essential tremor   . GERD (gastroesophageal reflux disease)    takes Dexilant daily as well as Zantac  . 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 History of bronchitis    Jan-Mar 2017  . History of hiatal hernia   . Hyperlipidemia    takes Atorvastatin daily  . Hypertension    takes HCTZ,Amlodipine,and Metoprolol daily  . Insomnia    takes Trazodone nightly   . Joint pain   . Muscle spasm    in neck.Takes Flexeril as needed  . Numbness    right knee,right hand,and toes on left  . Seizures ([place])    takes Lamotrigine daily. Last seizure 6+ yrs ago    Past  Surgical History:  Procedure Laterality Date  . CARDIAC CATHETERIZATION     2015  . CARPAL TUNNEL RELEASE    . COLONOSCOPY    . CORONARY ANGIOPLASTY WITH STENT PLACEMENT     x 3  . dental implants    . ESOPHAGOGASTRODUODENOSCOPY    . EYE SURGERY Bilateral   . EYE SURGERY     both eyes in 60's d/t muscle causing to squint  . HERNIA REPAIR     umbilical  . JOINT REPLACEMENT Right    knee  . KNEE ARTHROSCOPY    . MEDIAL PARTIAL KNEE REPLACEMENT Right   . MEDIASTERNOTOMY N/A 10/28/2015   Procedure: PARTIAL STERNOTOMY;  Surgeon: [name], MD;  Location: [place];  Service: Thoracic;  Laterality: N/A;  . numerous hand surgery    . 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   Procedure: RESECTION OF anterier MEDIASTINAL TUMOR;  Surgeon: [name], MD;  Location: [place];  Service: Thoracic;  Laterality: N/A;  . right hand fusion    . 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 TONSILLECTOMY      There were no vitals filed for this visit.      Subjective Assessment -  11/04/16 0935    Subjective Patient met with surgical doctor who patient reports is pleased with progress.    Patient is accompained by: Family member   Pertinent History Pt is a pleasant 71 year old male who presents s/p L4-S1 laminectomy/fusion on 09/23/16 and was discharged yesterday from the hospital. His back pain started in April and he has had a complicated history since then involving multiple falls in May due to overmedication from conflicting medications. Prior to April he was independent with all tasks.  He is having difficulty adapting to home due to having a lot of area rugs that are different heights, running obstacle course from objects in house while in the home. The bed at home is higher then he is used to. Patient wants to return to gardening and is currently on blood thinner for 6 months and is a fall risk.    Limitations Sitting;Lifting;Standing;Walking;House hold activities;Other  (comment)   How long can you sit comfortably? hour   How long can you stand comfortably? 30 minutes   How long can you walk comfortably? 10 minutes   Diagnostic tests surgery   Patient Stated Goals returning to gardening, like to strengthen, feel more confident in ambulation, decrease fear of fall, balance, get back to driving   Currently in Pain? Yes   Pain Score 3    Pain Location Back   Pain Orientation Lower   Pain Descriptors / Indicators Aching   Pain Type Surgical pain   Pain Onset 1 to 4 weeks ago   Pain Frequency Constant         Manual PROM: hamstring, quad, gastroc/soleus, hip flexor, ab, add hold for 30 seconds  TherEx  TA contractions 10x 5 second holds TA contraction with Swiss ball push pull  Modified Dead bug swiss ball UE only 10x, LE only 10x,  Heel slides 10x each leg Clam shells and reverse clam shells 12x each, bilateral legs Gluteal sets 15x5 second holds  Straight leg raise 12x each leg  Bend knee fallout Ankle pumps Ambulating with QC with horizontal head turns in hallway reading cards       All exercises performed on R and l legs  Pt. response to medical necessity:                            PT Education - 11/04/16 0945    Education provided Yes   Education Details TA contraction progression, body mechanics for safe progression of strength   Person(s) Educated Patient   Methods Explanation;Demonstration;Verbal cues   Comprehension Verbalized understanding;Returned demonstration          PT Short Term Goals - 10/21/16 1157      PT SHORT TERM GOAL #1   Title Patient will ambulate with cane for 100 ft to allow for more independent mobilization   Baseline 8/9 walk with walker 8/22: 30 ft with quad cane   Time 2   Period Weeks   Status Partially Met   Target Date 11/04/16     PT SHORT TERM GOAL #2   Title Patient will report a worst pain of 4/10 on VAS to improve tolerance with ADLs and reduced symptoms with  activities.    Baseline 8/9: pain worst 8/10 8/22 worst pain 7/10   Time 2   Period Weeks   Status Partially Met   Target Date 11/04/16     PT SHORT TERM GOAL #3   Title Patient will  increase 10 meter walk test to >1.48ms as to improve gait speed for better community ambulation and to reduce fall risk.   Baseline 8/9: 1.035m   Time 2   Period Weeks   Status On-going           PT Long Term Goals - 10/08/16 1011      PT LONG TERM GOAL #1   Title Patient will be independent in home exercise program to improve strength/mobility for better functional independence with ADLs.   Baseline given HEP   Time 6   Period Weeks   Status New   Target Date 11/19/16     PT LONG TERM GOAL #2   Title Patient will decrease FABQ score to < 40/96 to demonstrate improved quality of life.    Baseline 8/9: 66/96   Time 6   Period Weeks   Status New   Target Date 11/19/16     PT LONG TERM GOAL #3   Title Patient will reduce timed up and go to <11 seconds to reduce fall risk and demonstrate improved transfer/gait ability.   Baseline 8/9: 21 seconds   Time 6   Period Weeks   Status New   Target Date 11/19/16     PT LONG TERM GOAL #4   Title Patient will report a worst pain of 3/10 on VAS in   to improve tolerance with ADLs and reduced symptoms with activities.    Baseline 8/9: worst 8-9/10 pain   Time 6   Period Weeks   Status New   Target Date 11/19/16               Plan - 11/04/16 1836    Clinical Impression Statement Patient progressing with functional strengthening. Performed modified dead bug with good control of core strength. Ambulating with head turns challenging to patient with pt. Having two LOB requiring PT to right patient.  Patient will benefit from skilled physical therapy to improve strength, mobility, ambulation, and decrease fall risk to allow for return to prior level of function   Rehab Potential Good   Clinical Impairments Affecting Rehab Potential CAD  (coronary artery disease) , Hyperlipidemia, mixed , Amnesia , Hepatitis B , GERD (gastroesophageal reflux disease) , Anxiety, unspecified , Depression, unspecified , Hx of cardiac catheterization , Right bundle branch block , Valvular heart disease,  Benign essential hypertension,  Thymus neoplasm , Seizure (CMS-HCC) ,Thrombocytopenia (CMS-HCC) , Spondylolisthesis of lumbar region Asthma, unspecified OSA (obstructive sleep apnea) , Stroke (CMS-HCC), Ischemic embolic stroke (CMS-HCC) , Lymphedema , Meibomian gland dysfunction, Myopia, Presbyopia,  Spinal stenosis , Thymoma S/P lumbar laminectomy   PT Frequency 2x / week   PT Duration 6 weeks   PT Treatment/Interventions ADLs/Self Care Home Management;Cryotherapy;Electrical Stimulation;Iontophoresis 37m31ml Dexamethasone;Moist Heat;Ultrasound;Functional mobility training;Stair training;Gait training;DME Instruction;Therapeutic activities;Therapeutic exercise;Balance training;Neuromuscular re-education;Manual techniques;Patient/family education;Scar mobilization;Passive range of motion;Dry needling;Energy conservation;Visual/perceptual remediation/compensation   PT Next Visit Plan quad cane   PT Home Exercise Plan see sheet   Consulted and Agree with Plan of Care Patient;Family member/caregiver   Family Member Consulted wife      Patient will benefit from skilled therapeutic intervention in order to improve the following deficits and impairments:  Abnormal gait, Decreased activity tolerance, Decreased balance, Decreased knowledge of use of DME, Decreased endurance, Decreased mobility, Decreased range of motion, Decreased safety awareness, Decreased strength, Decreased scar mobility, Difficulty walking, Dizziness, Impaired perceived functional ability, Improper body mechanics, Postural dysfunction, Pain  Visit Diagnosis: Acute midline low back pain, with sciatica presence unspecified  Difficulty in walking, not elsewhere classified  Muscle weakness  (generalized)     Problem List Patient Active Problem List   Diagnosis Date Noted  . Lymphedema 12/16/2015  . Chronic venous insufficiency 12/16/2015  . Leg swelling 12/16/2015  . 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 Anxiety 05/23/2015  . Acid reflux 05/23/2015  . HBV (hepatitis B virus) infection 05/23/2015  . History of cardiac catheterization 05/23/2015  . Combined fat and carbohydrate induced hyperlipemia 05/23/2015  . Arteriosclerosis of coronary artery 05/23/2015  . Clinical depression 05/23/2015  . Cough 07/26/2014  . Benign essential HTN 07/16/2014  . Breathlessness on exertion 02/12/2014  . Ischemic embolic stroke ([place]) 66/44/0347  . Bundle branch block, right 05/29/2012  . History of knee surgery 05/26/2012   Janna Arch, PT, DPT   Janna Arch 11/04/2016, 6:37 PM  [place] MAIN [facility] SERVICES [address] [place], [place], [address] Phone: [phone]   Fax:  [phone]  Name: DANE KOPKE MRN: [id number] Date of Birth: [birth year]

## 2016-11-06 ENCOUNTER — Ambulatory Visit: Payer: Medicare Other

## 2016-11-06 VITALS — BP 112/61 | HR 67

## 2016-11-06 DIAGNOSIS — M6281 Muscle weakness (generalized): Secondary | ICD-10-CM

## 2016-11-06 DIAGNOSIS — M545 Low back pain: Secondary | ICD-10-CM

## 2016-11-06 DIAGNOSIS — R262 Difficulty in walking, not elsewhere classified: Secondary | ICD-10-CM

## 2016-11-06 NOTE — Therapy (Signed)
[place] MAIN [facility] SERVICES [address] [place], [place], [address] Phone: [phone]   Fax:  [phone]  Physical Therapy Treatment  Patient Details  Name: Johnny Mccoy MRN: [id number] Date of Birth: [birth year] Referring Provider: Dr. Launa Grill  Encounter Date: 11/06/2016      PT End of Session - 11/06/16 1029    Visit Number 8   Number of Visits 12   Date for PT Re-Evaluation 11/19/16   Authorization - Visit Number 8   Authorization - Number of Visits 10   PT Start Time 0929   PT Stop Time 1016   PT Time Calculation (min) 47 min   Equipment Utilized During Treatment Gait belt   Activity Tolerance Patient tolerated treatment well;Treatment limited secondary to medical complications (Comment)   Behavior During Therapy [facility] for tasks assessed/performed      Past Medical History:  Diagnosis Date  . Anxiety    takes Lorazepam daily  . Arthritis   . Asthma    Albuterol as needed.Breo daily as well as Flovent  . CAD (coronary artery disease)   . Cataract   . Cataracts, bilateral    immature  . Chronic back pain    compression  . Complication of anesthesia    seizures after partial knee in 2014.Surgery was in am and an still in post op until 9 that night. Was sent to [facility] for 5 days d/t uncontrollable shaking,incoherant  . Depressed    takes Pristiq daily  . Essential tremor   . GERD (gastroesophageal reflux disease)    takes Dexilant daily as well as Zantac  . 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 History of bronchitis    Jan-Mar 2017  . History of hiatal hernia   . Hyperlipidemia    takes Atorvastatin daily  . Hypertension    takes HCTZ,Amlodipine,and Metoprolol daily  . Insomnia    takes Trazodone nightly   . Joint pain   . Muscle spasm    in neck.Takes Flexeril as needed  . Numbness    right knee,right hand,and toes on left  . Seizures ([place])    takes Lamotrigine daily. Last seizure 6+ yrs ago    Past  Surgical History:  Procedure Laterality Date  . CARDIAC CATHETERIZATION     2015  . CARPAL TUNNEL RELEASE    . COLONOSCOPY    . CORONARY ANGIOPLASTY WITH STENT PLACEMENT     x 3  . dental implants    . ESOPHAGOGASTRODUODENOSCOPY    . EYE SURGERY Bilateral   . EYE SURGERY     both eyes in 60's d/t muscle causing to squint  . HERNIA REPAIR     umbilical  . JOINT REPLACEMENT Right    knee  . KNEE ARTHROSCOPY    . MEDIAL PARTIAL KNEE REPLACEMENT Right   . MEDIASTERNOTOMY N/A 10/28/2015   Procedure: PARTIAL STERNOTOMY;  Surgeon: [name], MD;  Location: [place];  Service: Thoracic;  Laterality: N/A;  . numerous hand surgery    . 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   Procedure: RESECTION OF anterier MEDIASTINAL TUMOR;  Surgeon: [name], MD;  Location: [place];  Service: Thoracic;  Laterality: N/A;  . right hand fusion    . 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 TONSILLECTOMY      Vitals:   11/06/16 0935  BP: 112/61  Pulse: 67  Subjective Assessment - 11/06/16 0936    Subjective Patient is having excessive dizziness today due to recently taking muscle relaxant. Says that it is common with his medication.    Patient is accompained by: Family member   Pertinent History Pt is a pleasant 71 year old male who presents s/p L4-S1 laminectomy/fusion on 09/23/16 and was discharged yesterday from the hospital. His back pain started in April and he has had a complicated history since then involving multiple falls in May due to overmedication from conflicting medications. Prior to April he was independent with all tasks.  He is having difficulty adapting to home due to having a lot of area rugs that are different heights, running obstacle course from objects in house while in the home. The bed at home is higher then he is used to. Patient wants to return to gardening and is currently on blood thinner for 6 months and is a fall risk.    Limitations  Sitting;Lifting;Standing;Walking;House hold activities;Other (comment)   How long can you sit comfortably? hour   How long can you stand comfortably? 30 minutes   How long can you walk comfortably? 10 minutes   Diagnostic tests surgery   Patient Stated Goals returning to gardening, like to strengthen, feel more confident in ambulation, decrease fear of fall, balance, get back to driving   Currently in Pain? Yes   Pain Score 2    Pain Location Back   Pain Orientation Lower   Pain Descriptors / Indicators Aching   Pain Type Surgical pain   Pain Onset 1 to 4 weeks ago      Manual PROM: hamstring, quad, gastroc/soleus, hip flexor, ab, add hold for 30 seconds   TherEx Partial squats 16x  TA contractions 10x 5 second holds TA contraction with Swiss ball push pull  TA contraction with UE raises with PVC pipe x15  Modified Dead bug swiss ball UE only 10x, LE only 10x,  Heel slides 10x each leg Clam shells and reverse clam shells 12x each, bilateral legs Gluteal sets 15x5 second holds  Straight leg raise 12x each leg  Bend knee fallout Ankle pumps Ambulating with finger tip support in // bars. Terminated from excessive dizziness       All exercises performed on R and l legs  Pt. response to medical necessity: Patient will benefit from skilled physical therapy to improve strength, mobility, ambulation, and decrease fall risk to allow for return to prior level of function.            PT Education - 11/06/16 1028    Education provided Yes   Education Details dizziness and vitals, body mechanics   Person(s) Educated Patient   Methods Explanation;Demonstration   Comprehension Verbalized understanding;Returned demonstration          PT Short Term Goals - 10/21/16 1157      PT SHORT TERM GOAL #1   Title Patient will ambulate with cane for 100 ft to allow for more independent mobilization   Baseline 8/9 walk with walker 8/22: 30 ft with quad cane   Time 2   Period Weeks    Status Partially Met   Target Date 11/04/16     PT SHORT TERM GOAL #2   Title Patient will report a worst pain of 4/10 on VAS to improve tolerance with ADLs and reduced symptoms with activities.    Baseline 8/9: pain worst 8/10 8/22 worst pain 7/10   Time 2   Period Weeks   Status  Partially Met   Target Date 11/04/16     PT SHORT TERM GOAL #3   Title Patient will increase 10 meter walk test to >1.49ms as to improve gait speed for better community ambulation and to reduce fall risk.   Baseline 8/9: 1.021m   Time 2   Period Weeks   Status On-going           PT Long Term Goals - 10/08/16 1011      PT LONG TERM GOAL #1   Title Patient will be independent in home exercise program to improve strength/mobility for better functional independence with ADLs.   Baseline given HEP   Time 6   Period Weeks   Status New   Target Date 11/19/16     PT LONG TERM GOAL #2   Title Patient will decrease FABQ score to < 40/96 to demonstrate improved quality of life.    Baseline 8/9: 66/96   Time 6   Period Weeks   Status New   Target Date 11/19/16     PT LONG TERM GOAL #3   Title Patient will reduce timed up and go to <11 seconds to reduce fall risk and demonstrate improved transfer/gait ability.   Baseline 8/9: 21 seconds   Time 6   Period Weeks   Status New   Target Date 11/19/16     PT LONG TERM GOAL #4   Title Patient will report a worst pain of 3/10 on VAS in   to improve tolerance with ADLs and reduced symptoms with activities.    Baseline 8/9: worst 8-9/10 pain   Time 6   Period Weeks   Status New   Target Date 11/19/16               Plan - 11/06/16 1030    Clinical Impression Statement Patient was limited in ambulatory ability today due to dizziness. Vitals monitored throughout session and patient was advised to discuss recent increase in dizziness after medication with doctor. Patient progressing with functional abdominal strengthening. Patient will benefit from  skilled physical therapy to improve strength, mobility, ambulation, and decrease fall risk to allow for return to prior level of function.    Rehab Potential Good   Clinical Impairments Affecting Rehab Potential CAD (coronary artery disease) , Hyperlipidemia, mixed , Amnesia , Hepatitis B , GERD (gastroesophageal reflux disease) , Anxiety, unspecified , Depression, unspecified , Hx of cardiac catheterization , Right bundle branch block , Valvular heart disease,  Benign essential hypertension,  Thymus neoplasm , Seizure (CMS-HCC) ,Thrombocytopenia (CMS-HCC) , Spondylolisthesis of lumbar region Asthma, unspecified OSA (obstructive sleep apnea) , Stroke (CMS-HCC), Ischemic embolic stroke (CMS-HCC) , Lymphedema , Meibomian gland dysfunction, Myopia, Presbyopia,  Spinal stenosis , Thymoma S/P lumbar laminectomy   PT Frequency 2x / week   PT Duration 6 weeks   PT Treatment/Interventions ADLs/Self Care Home Management;Cryotherapy;Electrical Stimulation;Iontophoresis 96m91ml Dexamethasone;Moist Heat;Ultrasound;Functional mobility training;Stair training;Gait training;DME Instruction;Therapeutic activities;Therapeutic exercise;Balance training;Neuromuscular re-education;Manual techniques;Patient/family education;Scar mobilization;Passive range of motion;Dry needling;Energy conservation;Visual/perceptual remediation/compensation   PT Next Visit Plan quad cane   PT Home Exercise Plan see sheet   Consulted and Agree with Plan of Care Patient;Family member/caregiver   Family Member Consulted wife      Patient will benefit from skilled therapeutic intervention in order to improve the following deficits and impairments:  Abnormal gait, Decreased activity tolerance, Decreased balance, Decreased knowledge of use of DME, Decreased endurance, Decreased mobility, Decreased range of motion, Decreased safety awareness, Decreased strength, Decreased scar mobility, Difficulty walking,  Dizziness, Impaired perceived functional  ability, Improper body mechanics, Postural dysfunction, Pain  Visit Diagnosis: Acute midline low back pain, with sciatica presence unspecified  Difficulty in walking, not elsewhere classified  Muscle weakness (generalized)     Problem List Patient Active Problem List   Diagnosis Date Noted  . Lymphedema 12/16/2015  . Chronic venous insufficiency 12/16/2015  . Leg swelling 12/16/2015  . 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 Anxiety 05/23/2015  . Acid reflux 05/23/2015  . HBV (hepatitis B virus) infection 05/23/2015  . History of cardiac catheterization 05/23/2015  . Combined fat and carbohydrate induced hyperlipemia 05/23/2015  . Arteriosclerosis of coronary artery 05/23/2015  . Clinical depression 05/23/2015  . Cough 07/26/2014  . Benign essential HTN 07/16/2014  . Breathlessness on exertion 02/12/2014  . Ischemic embolic stroke ([place]) 52/59/1028  . Bundle branch block, right 05/29/2012  . History of knee surgery 05/26/2012  Janna Arch, PT, DPT    Janna Arch 11/06/2016, 10:30 AM  [place] MAIN [facility] SERVICES [address] [place], [place], [address] Phone: [phone]   Fax:  [phone]  Name: Johnny Mccoy MRN: [id number] Date of Birth: 1945/04/20

## 2016-11-09 ENCOUNTER — Ambulatory Visit: Payer: Medicare Other

## 2016-11-09 DIAGNOSIS — M545 Low back pain: Secondary | ICD-10-CM | POA: Diagnosis not present

## 2016-11-09 DIAGNOSIS — R262 Difficulty in walking, not elsewhere classified: Secondary | ICD-10-CM

## 2016-11-09 DIAGNOSIS — M6281 Muscle weakness (generalized): Secondary | ICD-10-CM

## 2016-11-09 NOTE — Therapy (Signed)
[place] MAIN [facility] SERVICES [address] [place], [place], [address] Phone: [phone]   Fax:  [phone]  Physical Therapy Treatment  Patient Details  Name: Johnny Mccoy MRN: [id number] Date of Birth: 1945/06/10 Referring Provider: Dr. Launa Grill  Encounter Date: 11/09/2016      PT End of Session - 11/09/16 0938    Visit Number 10   Number of Visits 24   Date for PT Re-Evaluation 12/21/16   Authorization Type next 1/10   Authorization - Visit Number 10   Authorization - Number of Visits 10   PT Start Time 0929   PT Stop Time 1015   PT Time Calculation (min) 46 min   Equipment Utilized During Treatment Gait belt   Activity Tolerance Patient tolerated treatment well   Behavior During Therapy [facility] for tasks assessed/performed      Past Medical History:  Diagnosis Date  . Anxiety    takes Lorazepam daily  . Arthritis   . Asthma    Albuterol as needed.Breo daily as well as Flovent  . CAD (coronary artery disease)   . Cataract   . Cataracts, bilateral    immature  . Chronic back pain    compression  . Complication of anesthesia    seizures after partial knee in 2014.Surgery was in am and an still in post op until 9 that night. Was sent to [facility] for 5 days d/t uncontrollable shaking,incoherant  . Depressed    takes Pristiq daily  . Essential tremor   . GERD (gastroesophageal reflux disease)    takes Dexilant daily as well as Zantac  . 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 History of bronchitis    Jan-Mar 2017  . History of hiatal hernia   . Hyperlipidemia    takes Atorvastatin daily  . Hypertension    takes HCTZ,Amlodipine,and Metoprolol daily  . Insomnia    takes Trazodone nightly   . Joint pain   . Muscle spasm    in neck.Takes Flexeril as needed  . Numbness    right knee,right hand,and toes on left  . Seizures ([place])    takes Lamotrigine daily. Last seizure 6+ yrs ago    Past Surgical History:   Procedure Laterality Date  . CARDIAC CATHETERIZATION     2015  . CARPAL TUNNEL RELEASE    . COLONOSCOPY    . CORONARY ANGIOPLASTY WITH STENT PLACEMENT     x 3  . dental implants    . ESOPHAGOGASTRODUODENOSCOPY    . EYE SURGERY Bilateral   . EYE SURGERY     both eyes in 60's d/t muscle causing to squint  . HERNIA REPAIR     umbilical  . JOINT REPLACEMENT Right    knee  . KNEE ARTHROSCOPY    . MEDIAL PARTIAL KNEE REPLACEMENT Right   . MEDIASTERNOTOMY N/A 10/28/2015   Procedure: PARTIAL STERNOTOMY;  Surgeon: [name], MD;  Location: [place];  Service: Thoracic;  Laterality: N/A;  . numerous hand surgery    . 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   Procedure: RESECTION OF anterier MEDIASTINAL TUMOR;  Surgeon: [name], MD;  Location: [place];  Service: Thoracic;  Laterality: N/A;  . right hand fusion    . 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 TONSILLECTOMY      There were no vitals filed for this visit.      Subjective Assessment -  11/09/16 0934    Subjective Patient no longer dizzy. Been walking over the weekend.    Patient is accompained by: Family member   Pertinent History Pt is a pleasant 71 year old male who presents s/p L4-S1 laminectomy/fusion on 09/23/16 and was discharged yesterday from the hospital. His back pain started in April and he has had a complicated history since then involving multiple falls in May due to overmedication from conflicting medications. Prior to April he was independent with all tasks.  He is having difficulty adapting to home due to having a lot of area rugs that are different heights, running obstacle course from objects in house while in the home. The bed at home is higher then he is used to. Patient wants to return to gardening and is currently on blood thinner for 6 months and is a fall risk.    Limitations Sitting;Lifting;Standing;Walking;House hold activities;Other (comment)   How long can you sit  comfortably? hour   How long can you stand comfortably? 30 minutes   How long can you walk comfortably? 10 minutes   Diagnostic tests surgery   Patient Stated Goals returning to gardening, like to strengthen, feel more confident in ambulation, decrease fear of fall, balance, get back to driving   Currently in Pain? Yes   Pain Score 2    Pain Location Back   Pain Orientation Lower   Pain Descriptors / Indicators Aching   Pain Type Surgical pain   Pain Onset 1 to 4 weeks ago   Pain Frequency Constant    ABC: 55/96 TUG: 9 seconds no cane 10MWT:7 seconds= 1.4 m/s  6 min walk test=1310 ft   ambulate 100 ft with cane TherEx  TA contraction with swiss ball x 10x5 second  TA contractions 10x 5 second holds  Ankle pumps        All exercises performed on R and l legs  Pt. response to medical necessity: Patient will benefit from skilled physical therapy to improve strength, mobility, ambulation, and decrease fall risk to allow for return to prior level of function                             PT Education - 11/09/16 0937    Education provided Yes   Education Details body mechanics, safe transfers   Person(s) Educated Patient   Methods Explanation;Demonstration;Verbal cues   Comprehension Verbalized understanding;Returned demonstration          PT Short Term Goals - 11/09/16 0949      PT SHORT TERM GOAL #1   Title Patient will ambulate with cane for 100 ft to allow for more independent mobilization   Baseline 8/9 walk with walker 8/22: 30 ft with quad cane  9/10: 1310 ft no cane   Time 2   Period Weeks   Status Achieved     PT SHORT TERM GOAL #2   Title Patient will report a worst pain of 4/10 on VAS to improve tolerance with ADLs and reduced symptoms with activities.    Baseline 8/9: pain worst 8/10 8/22 worst pain 7/10 9/10: 3/10   Time 2   Period Weeks   Status Achieved     PT SHORT TERM GOAL #3   Title Patient will increase 10 meter walk  test to >1.39ms as to improve gait speed for better community ambulation and to reduce fall risk.   Baseline 8/9: 1.045m 9/10: 1.60m71mw/o cane   Time  2   Period Weeks   Status Achieved     PT SHORT TERM GOAL #4   Title Patient will walk without cane outside with no LOB to increase independent ambulation   Baseline Patient requires use of cane to ambulate outside   Time 2   Period Weeks   Status New     PT SHORT TERM GOAL #5   Title Patient will return to gardening without LOB or back pain to return to previous level of activity   Baseline 9/10: unable to garden at this time   Time 6   Period Weeks   Status New   Target Date 12/21/16           PT Long Term Goals - 11/09/16 0943      PT LONG TERM GOAL #1   Title Patient will be independent in home exercise program to improve strength/mobility for better functional independence with ADLs.   Baseline proficient with HEP   Time 6   Period Weeks   Status Partially Met     PT LONG TERM GOAL #2   Title Patient will decrease FABQ score to < 40/96 to demonstrate improved quality of life.    Baseline 8/9: 66/96 9/10: 55/96   Time 6   Period Weeks   Status Partially Met     PT LONG TERM GOAL #3   Title Patient will reduce timed up and go to <11 seconds to reduce fall risk and demonstrate improved transfer/gait ability.   Baseline 8/9: 21 seconds 9/10: 9 seconds with no cane    Time 6   Period Weeks   Status Achieved     PT LONG TERM GOAL #4   Title Patient will report a worst pain of 3/10 on VAS in   to improve tolerance with ADLs and reduced symptoms with activities.    Baseline 8/9: worst 8-9/10 pain; 9/10 : worst pain 3/10   Time 6   Period Weeks   Status Achieved     PT LONG TERM GOAL #5   Title Patient will report average pain of 0/10 on VAS in  to improve tolerance with ADLs and reduced symptoms with activities.    Baseline avg pain 3/10   Time 6   Period Weeks   Status New   Target Date 12/21/16      Additional Long Term Goals   Additional Long Term Goals Yes               Plan - 11/09/16 1126    Clinical Impression Statement Patient is progressing towards goals. Decreased pain is noted, however continues to contribute against patient's lifestyle. Patient ambulated without cane for first time in session scoring : TUG=9 seconds, 10MWT=1.16ms, and 6 min walk test =1310 ft. ABC=55/96.  Patient will benefit from skilled physical therapy to improve strength, mobility, ambulation, and decrease fall risk to allow for return to prior level of function   Rehab Potential Good   Clinical Impairments Affecting Rehab Potential CAD (coronary artery disease) , Hyperlipidemia, mixed , Amnesia , Hepatitis B , GERD (gastroesophageal reflux disease) , Anxiety, unspecified , Depression, unspecified , Hx of cardiac catheterization , Right bundle branch block , Valvular heart disease,  Benign essential hypertension,  Thymus neoplasm , Seizure (CMS-HCC) ,Thrombocytopenia (CMS-HCC) , Spondylolisthesis of lumbar region Asthma, unspecified OSA (obstructive sleep apnea) , Stroke (CMS-HCC), Ischemic embolic stroke (CMS-HCC) , Lymphedema , Meibomian gland dysfunction, Myopia, Presbyopia,  Spinal stenosis , Thymoma S/P lumbar laminectomy  PT Frequency 2x / week   PT Duration 6 weeks   PT Treatment/Interventions ADLs/Self Care Home Management;Cryotherapy;Electrical Stimulation;Iontophoresis 37m/ml Dexamethasone;Moist Heat;Ultrasound;Functional mobility training;Stair training;Gait training;DME Instruction;Therapeutic activities;Therapeutic exercise;Balance training;Neuromuscular re-education;Manual techniques;Patient/family education;Scar mobilization;Passive range of motion;Dry needling;Energy conservation;Visual/perceptual remediation/compensation   PT Next Visit Plan walk without cane, stairs   PT Home Exercise Plan see sheet   Consulted and Agree with Plan of Care Patient;Family member/caregiver   Family Member  Consulted wife      Patient will benefit from skilled therapeutic intervention in order to improve the following deficits and impairments:  Abnormal gait, Decreased activity tolerance, Decreased balance, Decreased knowledge of use of DME, Decreased endurance, Decreased mobility, Decreased range of motion, Decreased safety awareness, Decreased strength, Decreased scar mobility, Difficulty walking, Dizziness, Impaired perceived functional ability, Improper body mechanics, Postural dysfunction, Pain  Visit Diagnosis: Acute midline low back pain, with sciatica presence unspecified  Difficulty in walking, not elsewhere classified  Muscle weakness (generalized)       G-Codes - [id number]    Functional Assessment Tool Used (Outpatient Only) 10MWT, TUG, 5xSTS, FABQ, 6 min walk test, VAS, clinical judgement   Functional Limitation Mobility: Walking and moving around   Mobility: Walking and Moving Around Current Status [id number] At least 20 percent but less than 40 percent impaired, limited or restricted   Mobility: Walking and Moving Around Goal Status [phone] At least 1 percent but less than 20 percent impaired, limited or restricted      Problem List Patient Active Problem List   Diagnosis Date Noted  . Lymphedema 12/16/2015  . Chronic venous insufficiency 12/16/2015  . Leg swelling 12/16/2015  . 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 Anxiety 05/23/2015  . Acid reflux 05/23/2015  . HBV (hepatitis B virus) infection 05/23/2015  . History of cardiac catheterization 05/23/2015  . Combined fat and carbohydrate induced hyperlipemia 05/23/2015  . Arteriosclerosis of coronary artery 05/23/2015  . Clinical depression 05/23/2015  . Cough 07/26/2014  . Benign essential HTN 07/16/2014  . Breathlessness on exertion 02/12/2014  . Ischemic embolic stroke ([place] 033/83/2919 . Bundle branch block, right 05/29/2012  . History of knee surgery 05/26/2012   MJanna Arch PT,  DPT   MJanna Arch92018/09/15 11:33 AM  [place]MAIN [facility] CenterSERV[facility] [address] [address]Phone: [phone]  Fax:  [phone] Name: Johnny CALISEMRN: [id number] of Birth: 3March 18, 1947

## 2016-11-11 ENCOUNTER — Ambulatory Visit: Payer: Medicare Other

## 2016-11-11 DIAGNOSIS — R262 Difficulty in walking, not elsewhere classified: Secondary | ICD-10-CM

## 2016-11-11 DIAGNOSIS — M545 Low back pain: Secondary | ICD-10-CM | POA: Diagnosis not present

## 2016-11-11 DIAGNOSIS — M6281 Muscle weakness (generalized): Secondary | ICD-10-CM

## 2016-11-11 NOTE — Therapy (Signed)
[place] MAIN [facility] SERVICES [address] [place], [place], [address] Phone: [phone]   Fax:  [phone]  Physical Therapy Treatment  Patient Details  Name: Johnny Mccoy MRN: [id number] Date of Birth: April 26, 1945 Referring Provider: Dr. Launa Grill  Encounter Date: 11/11/2016      PT End of Session - 11/11/16 0941    Visit Number 11   Number of Visits 24   Date for PT Re-Evaluation 12/21/16   Authorization - Visit Number 1   Authorization - Number of Visits 10   PT Start Time 0930   PT Stop Time 1015   PT Time Calculation (min) 45 min   Equipment Utilized During Treatment Gait belt   Activity Tolerance Patient tolerated treatment well   Behavior During Therapy [facility] for tasks assessed/performed      Past Medical History:  Diagnosis Date  . Anxiety    takes Lorazepam daily  . Arthritis   . Asthma    Albuterol as needed.Breo daily as well as Flovent  . CAD (coronary artery disease)   . Cataract   . Cataracts, bilateral    immature  . Chronic back pain    compression  . Complication of anesthesia    seizures after partial knee in 2014.Surgery was in am and an still in post op until 9 that night. Was sent to [facility] for 5 days d/t uncontrollable shaking,incoherant  . Depressed    takes Pristiq daily  . Essential tremor   . GERD (gastroesophageal reflux disease)    takes Dexilant daily as well as Zantac  . 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 History of bronchitis    Jan-Mar 2017  . History of hiatal hernia   . Hyperlipidemia    takes Atorvastatin daily  . Hypertension    takes HCTZ,Amlodipine,and Metoprolol daily  . Insomnia    takes Trazodone nightly   . Joint pain   . Muscle spasm    in neck.Takes Flexeril as needed  . Numbness    right knee,right hand,and toes on left  . Seizures ([place])    takes Lamotrigine daily. Last seizure 6+ yrs ago    Past Surgical History:  Procedure Laterality Date  . CARDIAC  CATHETERIZATION     2015  . CARPAL TUNNEL RELEASE    . COLONOSCOPY    . CORONARY ANGIOPLASTY WITH STENT PLACEMENT     x 3  . dental implants    . ESOPHAGOGASTRODUODENOSCOPY    . EYE SURGERY Bilateral   . EYE SURGERY     both eyes in 60's d/t muscle causing to squint  . HERNIA REPAIR     umbilical  . JOINT REPLACEMENT Right    knee  . KNEE ARTHROSCOPY    . MEDIAL PARTIAL KNEE REPLACEMENT Right   . MEDIASTERNOTOMY N/A 10/28/2015   Procedure: PARTIAL STERNOTOMY;  Surgeon: [name], MD;  Location: [place];  Service: Thoracic;  Laterality: N/A;  . numerous hand surgery    . 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   Procedure: RESECTION OF anterier MEDIASTINAL TUMOR;  Surgeon: [name], MD;  Location: [place];  Service: Thoracic;  Laterality: N/A;  . right hand fusion    . 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 TONSILLECTOMY      There were no vitals filed for this visit.      Subjective Assessment - 11/11/16 0935    Subjective  Patient overdid it in the yard yesterday, had excessive pain. Was able to walk later that day without cane in parking lot.    Patient is accompained by: Family member   Pertinent History Pt is a pleasant 71 year old male who presents s/p L4-S1 laminectomy/fusion on 09/23/16 and was discharged yesterday from the hospital. His back pain started in April and he has had a complicated history since then involving multiple falls in May due to overmedication from conflicting medications. Prior to April he was independent with all tasks.  He is having difficulty adapting to home due to having a lot of area rugs that are different heights, running obstacle course from objects in house while in the home. The bed at home is higher then he is used to. Patient wants to return to gardening and is currently on blood thinner for 6 months and is a fall risk.    Limitations Sitting;Lifting;Standing;Walking;House hold activities;Other (comment)    How long can you sit comfortably? hour   How long can you stand comfortably? 30 minutes   How long can you walk comfortably? 10 minutes   Diagnostic tests surgery   Patient Stated Goals returning to gardening, like to strengthen, feel more confident in ambulation, decrease fear of fall, balance, get back to driving   Currently in Pain? Yes   Pain Score 3    Pain Location Back   Pain Orientation Lower   Pain Descriptors / Indicators Aching   Pain Type Surgical pain   Pain Onset 1 to 4 weeks ago     Manual PROM: hamstring, quad, gastroc/soleus, hip flexor, ab, add hold for 30 seconds   TherEx Partial squats 16x  TA contractions 10x 5 second holds TA contraction with Swiss ball push pull  TA contraction with UE raises with PVC pipe x15  Modified Dead bug swiss ball UE only 12x, LE only 12x, combined 12x Heel slides 12x each leg Clam shells and reverse clam shells 12x each, bilateral legs Gluteal sets 15x5 second holds  Straight leg raise 12x each leg  Bend knee fallout Ankle pumps Ambulating without AD in hallway with head turns 200 ft Ambulating without AD 800 feet with cues for alternating arm motion   Negotiating stairs: ascend no UE support reciprocal motion smooth no need for alterations, descend choppy heavy initial contact due to fear and LE weakness       All exercises performed on R and l legs  Pt. response to medical necessity: Patient will benefit from skilled physical therapy to improve strength, mobility, ambulation, and decrease fall risk to allow for return to prior level of function.                              PT Education - 11/11/16 0936    Education provided Yes   Education Details body mechanics with movement   Person(s) Educated Patient   Methods Explanation;Demonstration;Verbal cues   Comprehension Verbalized understanding;Returned demonstration          PT Short Term Goals - 11/09/16 0949      PT SHORT TERM GOAL #1    Title Patient will ambulate with cane for 100 ft to allow for more independent mobilization   Baseline 8/9 walk with walker 8/22: 30 ft with quad cane  9/10: 1310 ft no cane   Time 2   Period Weeks   Status Achieved     PT SHORT TERM GOAL #2  Title Patient will report a worst pain of 4/10 on VAS to improve tolerance with ADLs and reduced symptoms with activities.    Baseline 8/9: pain worst 8/10 8/22 worst pain 7/10 9/10: 3/10   Time 2   Period Weeks   Status Achieved     PT SHORT TERM GOAL #3   Title Patient will increase 10 meter walk test to >1.15ms as to improve gait speed for better community ambulation and to reduce fall risk.   Baseline 8/9: 1.041m 9/10: 1.61m461mw/o cane   Time 2   Period Weeks   Status Achieved     PT SHORT TERM GOAL #4   Title Patient will walk without cane outside with no LOB to increase independent ambulation   Baseline Patient requires use of cane to ambulate outside   Time 2   Period Weeks   Status New     PT SHORT TERM GOAL #5   Title Patient will return to gardening without LOB or back pain to return to previous level of activity   Baseline 9/10: unable to garden at this time   Time 6   Period Weeks   Status New   Target Date 12/21/16           PT Long Term Goals - 11/09/16 0943      PT LONG TERM GOAL #1   Title Patient will be independent in home exercise program to improve strength/mobility for better functional independence with ADLs.   Baseline proficient with HEP   Time 6   Period Weeks   Status Partially Met     PT LONG TERM GOAL #2   Title Patient will decrease FABQ score to < 40/96 to demonstrate improved quality of life.    Baseline 8/9: 66/96 9/10: 55/96   Time 6   Period Weeks   Status Partially Met     PT LONG TERM GOAL #3   Title Patient will reduce timed up and go to <11 seconds to reduce fall risk and demonstrate improved transfer/gait ability.   Baseline 8/9: 21 seconds 9/10: 9 seconds with no cane    Time 6    Period Weeks   Status Achieved     PT LONG TERM GOAL #4   Title Patient will report a worst pain of 3/10 on VAS in   to improve tolerance with ADLs and reduced symptoms with activities.    Baseline 8/9: worst 8-9/10 pain; 9/10 : worst pain 3/10   Time 6   Period Weeks   Status Achieved     PT LONG TERM GOAL #5   Title Patient will report average pain of 0/10 on VAS in  to improve tolerance with ADLs and reduced symptoms with activities.    Baseline avg pain 3/10   Time 6   Period Weeks   Status New   Target Date 12/21/16     Additional Long Term Goals   Additional Long Term Goals Yes               Plan - 11/11/16 1020    Clinical Impression Statement Patient progressing with functional mobility, performing stair negotiation for first time without UE support. Descending stairs is challenging to patient due to fear and LE weakness. Patient requires cues for UE movement with ambulation.  Patient will benefit from skilled physical therapy to improve strength, mobility, ambulation, and decrease fall risk to allow for return to prior level of function.   Rehab Potential Good  Clinical Impairments Affecting Rehab Potential CAD (coronary artery disease) , Hyperlipidemia, mixed , Amnesia , Hepatitis B , GERD (gastroesophageal reflux disease) , Anxiety, unspecified , Depression, unspecified , Hx of cardiac catheterization , Right bundle branch block , Valvular heart disease,  Benign essential hypertension,  Thymus neoplasm , Seizure (CMS-HCC) ,Thrombocytopenia (CMS-HCC) , Spondylolisthesis of lumbar region Asthma, unspecified OSA (obstructive sleep apnea) , Stroke (CMS-HCC), Ischemic embolic stroke (CMS-HCC) , Lymphedema , Meibomian gland dysfunction, Myopia, Presbyopia,  Spinal stenosis , Thymoma S/P lumbar laminectomy   PT Frequency 2x / week   PT Duration 6 weeks   PT Treatment/Interventions ADLs/Self Care Home Management;Cryotherapy;Electrical Stimulation;Iontophoresis 49m/ml  Dexamethasone;Moist Heat;Ultrasound;Functional mobility training;Stair training;Gait training;DME Instruction;Therapeutic activities;Therapeutic exercise;Balance training;Neuromuscular re-education;Manual techniques;Patient/family education;Scar mobilization;Passive range of motion;Dry needling;Energy conservation;Visual/perceptual remediation/compensation   PT Next Visit Plan stairs, progression of LE strength   PT Home Exercise Plan see sheet   Consulted and Agree with Plan of Care Patient;Family member/caregiver   Family Member Consulted wife      Patient will benefit from skilled therapeutic intervention in order to improve the following deficits and impairments:  Abnormal gait, Decreased activity tolerance, Decreased balance, Decreased knowledge of use of DME, Decreased endurance, Decreased mobility, Decreased range of motion, Decreased safety awareness, Decreased strength, Decreased scar mobility, Difficulty walking, Dizziness, Impaired perceived functional ability, Improper body mechanics, Postural dysfunction, Pain  Visit Diagnosis: Acute midline low back pain, with sciatica presence unspecified  Difficulty in walking, not elsewhere classified  Muscle weakness (generalized)     Problem List Patient Active Problem List   Diagnosis Date Noted  . Lymphedema 12/16/2015  . Chronic venous insufficiency 12/16/2015  . Leg swelling 12/16/2015  . 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 Anxiety 05/23/2015  . Acid reflux 05/23/2015  . HBV (hepatitis B virus) infection 05/23/2015  . History of cardiac catheterization 05/23/2015  . Combined fat and carbohydrate induced hyperlipemia 05/23/2015  . Arteriosclerosis of coronary artery 05/23/2015  . Clinical depression 05/23/2015  . Cough 07/26/2014  . Benign essential HTN 07/16/2014  . Breathlessness on exertion 02/12/2014  . Ischemic embolic stroke ([place] 032/67/1245 . Bundle branch block, right 05/29/2012  . History of  knee surgery 05/26/2012   MJanna Arch PT, DPT   MJanna Arch9/01/2017, 10:23 AM  [place]MAIN [facility] CenterSERVICES [address] [address]Phone: [phone]  Fax:  [phone] Name: JBASSEL GASKILLMRN: [id number] of Birth: 301-20-1947

## 2016-11-16 ENCOUNTER — Ambulatory Visit: Payer: Medicare Other

## 2016-11-16 DIAGNOSIS — R262 Difficulty in walking, not elsewhere classified: Secondary | ICD-10-CM

## 2016-11-16 DIAGNOSIS — M545 Low back pain: Secondary | ICD-10-CM | POA: Diagnosis not present

## 2016-11-16 DIAGNOSIS — M6281 Muscle weakness (generalized): Secondary | ICD-10-CM

## 2016-11-16 NOTE — Therapy (Signed)
[place] MAIN [facility] SERVICES [address] [place], [place], [address] Phone: [phone]   Fax:  [phone]  Physical Therapy Treatment  Patient Details  Name: DONTARIOUS SCHAUM MRN: [id number] Date of Birth: 1945-05-15 Referring Provider: Dr. Launa Grill  Encounter Date: 11/16/2016      PT End of Session - 11/16/16 0955    Visit Number 12   Number of Visits 24   Date for PT Re-Evaluation 12/21/16   Authorization - Visit Number 2   Authorization - Number of Visits 10   PT Start Time 0930   PT Stop Time 1015   PT Time Calculation (min) 45 min   Equipment Utilized During Treatment Gait belt   Activity Tolerance Patient tolerated treatment well   Behavior During Therapy [facility] for tasks assessed/performed      Past Medical History:  Diagnosis Date  . Anxiety    takes Lorazepam daily  . Arthritis   . Asthma    Albuterol as needed.Breo daily as well as Flovent  . CAD (coronary artery disease)   . Cataract   . Cataracts, bilateral    immature  . Chronic back pain    compression  . Complication of anesthesia    seizures after partial knee in 2014.Surgery was in am and an still in post op until 9 that night. Was sent to [facility] for 5 days d/t uncontrollable shaking,incoherant  . Depressed    takes Pristiq daily  . Essential tremor   . GERD (gastroesophageal reflux disease)    takes Dexilant daily as well as Zantac  . 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 History of bronchitis    Jan-Mar 2017  . History of hiatal hernia   . Hyperlipidemia    takes Atorvastatin daily  . Hypertension    takes HCTZ,Amlodipine,and Metoprolol daily  . Insomnia    takes Trazodone nightly   . Joint pain   . Muscle spasm    in neck.Takes Flexeril as needed  . Numbness    right knee,right hand,and toes on left  . Seizures ([place])    takes Lamotrigine daily. Last seizure 6+ yrs ago    Past Surgical History:  Procedure Laterality Date  . CARDIAC  CATHETERIZATION     2015  . CARPAL TUNNEL RELEASE    . COLONOSCOPY    . CORONARY ANGIOPLASTY WITH STENT PLACEMENT     x 3  . dental implants    . ESOPHAGOGASTRODUODENOSCOPY    . EYE SURGERY Bilateral   . EYE SURGERY     both eyes in 60's d/t muscle causing to squint  . HERNIA REPAIR     umbilical  . JOINT REPLACEMENT Right    knee  . KNEE ARTHROSCOPY    . MEDIAL PARTIAL KNEE REPLACEMENT Right   . MEDIASTERNOTOMY N/A 10/28/2015   Procedure: PARTIAL STERNOTOMY;  Surgeon: [name], MD;  Location: [place];  Service: Thoracic;  Laterality: N/A;  . numerous hand surgery    . 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   Procedure: RESECTION OF anterier MEDIASTINAL TUMOR;  Surgeon: [name], MD;  Location: [place];  Service: Thoracic;  Laterality: N/A;  . right hand fusion    . 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 TONSILLECTOMY      There were no vitals filed for this visit.      Subjective Assessment - 11/16/16 0932    Subjective  Atmospheric pressure caused excessive pressure in joints and discomfort. walking without AD now. Able to put shoes on independently now.    Patient is accompained by: Family member   Pertinent History Pt is a pleasant 71 year old male who presents s/p L4-S1 laminectomy/fusion on 09/23/16 and was discharged yesterday from the hospital. His back pain started in April and he has had a complicated history since then involving multiple falls in May due to overmedication from conflicting medications. Prior to April he was independent with all tasks.  He is having difficulty adapting to home due to having a lot of area rugs that are different heights, running obstacle course from objects in house while in the home. The bed at home is higher then he is used to. Patient wants to return to gardening and is currently on blood thinner for 6 months and is a fall risk.    Limitations Sitting;Lifting;Standing;Walking;House hold activities;Other  (comment)   How long can you sit comfortably? hour   How long can you stand comfortably? 30 minutes   How long can you walk comfortably? 10 minutes   Diagnostic tests surgery   Patient Stated Goals returning to gardening, like to strengthen, feel more confident in ambulation, decrease fear of fall, balance, get back to driving   Currently in Pain? Yes   Pain Score 1    Pain Location Back   Pain Orientation Lower   Pain Descriptors / Indicators Aching   Pain Onset 1 to 4 weeks ago   Pain Frequency Constant       Manual PROM: hamstring, quad, gastroc/soleus, hip flexor, ab, add hold for 60 seconds   TherEx Partial squats 16x   TA contractions 10x 5 second holds TA contraction with Swiss ball push pull  TA contraction with UE raises with PVC pipe x15  Modified Dead bug swiss ball 15x Heel slides 12x each leg Clam shells and reverse clam shells 12x each, bilateral legs Gluteal sets 15x5 second holds  Straight leg raise 12x each leg  Bend knee fallout Ankle pumps  Ambulating 400 ft with no AD, good arm swing   RTB side step back and forth across plinth table (finger tips on plinth)  RTB around ankles hip extension 12x each leg GTB around ankles seated hip abduction 15x.  Negotiating stairs: ascend no UE support reciprocal motion smooth no need for alterations, descend choppy heavy initial contact due to fear and LE weakness       All exercises performed on R and l legs  Pt. response to medical necessity: Patient will benefit from skilled physical therapy to improve strength, mobility, ambulation, and decrease fall risk to allow for return to prior level of function.                             PT Education - 11/16/16 0954    Education provided Yes   Education Details stair negotiation    Person(s) Educated Patient   Methods Explanation;Demonstration   Comprehension Verbalized understanding;Returned demonstration          PT Short Term Goals -  11/09/16 0949      PT SHORT TERM GOAL #1   Title Patient will ambulate with cane for 100 ft to allow for more independent mobilization   Baseline 8/9 walk with walker 8/22: 30 ft with quad cane  9/10: 1310 ft no cane   Time 2   Period Weeks   Status Achieved  PT SHORT TERM GOAL #2   Title Patient will report a worst pain of 4/10 on VAS to improve tolerance with ADLs and reduced symptoms with activities.    Baseline 8/9: pain worst 8/10 8/22 worst pain 7/10 9/10: 3/10   Time 2   Period Weeks   Status Achieved     PT SHORT TERM GOAL #3   Title Patient will increase 10 meter walk test to >1.47ms as to improve gait speed for better community ambulation and to reduce fall risk.   Baseline 8/9: 1.050m 9/10: 1.63m19mw/o cane   Time 2   Period Weeks   Status Achieved     PT SHORT TERM GOAL #4   Title Patient will walk without cane outside with no LOB to increase independent ambulation   Baseline Patient requires use of cane to ambulate outside   Time 2   Period Weeks   Status New     PT SHORT TERM GOAL #5   Title Patient will return to gardening without LOB or back pain to return to previous level of activity   Baseline 9/10: unable to garden at this time   Time 6   Period Weeks   Status New   Target Date 12/21/16           PT Long Term Goals - 11/09/16 0943      PT LONG TERM GOAL #1   Title Patient will be independent in home exercise program to improve strength/mobility for better functional independence with ADLs.   Baseline proficient with HEP   Time 6   Period Weeks   Status Partially Met     PT LONG TERM GOAL #2   Title Patient will decrease FABQ score to < 40/96 to demonstrate improved quality of life.    Baseline 8/9: 66/96 9/10: 55/96   Time 6   Period Weeks   Status Partially Met     PT LONG TERM GOAL #3   Title Patient will reduce timed up and go to <11 seconds to reduce fall risk and demonstrate improved transfer/gait ability.   Baseline 8/9: 21  seconds 9/10: 9 seconds with no cane    Time 6   Period Weeks   Status Achieved     PT LONG TERM GOAL #4   Title Patient will report a worst pain of 3/10 on VAS in   to improve tolerance with ADLs and reduced symptoms with activities.    Baseline 8/9: worst 8-9/10 pain; 9/10 : worst pain 3/10   Time 6   Period Weeks   Status Achieved     PT LONG TERM GOAL #5   Title Patient will report average pain of 0/10 on VAS in  to improve tolerance with ADLs and reduced symptoms with activities.    Baseline avg pain 3/10   Time 6   Period Weeks   Status New   Target Date 12/21/16     Additional Long Term Goals   Additional Long Term Goals Yes               Plan - 11/16/16 1017    Clinical Impression Statement Patient demonstrating improved ability to negotiate stairs with decreased need for verbal cueing. Ambulation with arm swing noted with no cues need for correction of gait mechanics. Patient will benefit from skilled physical therapy to improve strength, mobility, ambulation, and decrease fall risk to allow for return to prior level of function.    Rehab Potential Good  Clinical Impairments Affecting Rehab Potential CAD (coronary artery disease) , Hyperlipidemia, mixed , Amnesia , Hepatitis B , GERD (gastroesophageal reflux disease) , Anxiety, unspecified , Depression, unspecified , Hx of cardiac catheterization , Right bundle branch block , Valvular heart disease,  Benign essential hypertension,  Thymus neoplasm , Seizure (CMS-HCC) ,Thrombocytopenia (CMS-HCC) , Spondylolisthesis of lumbar region Asthma, unspecified OSA (obstructive sleep apnea) , Stroke (CMS-HCC), Ischemic embolic stroke (CMS-HCC) , Lymphedema , Meibomian gland dysfunction, Myopia, Presbyopia,  Spinal stenosis , Thymoma S/P lumbar laminectomy   PT Frequency 2x / week   PT Duration 6 weeks   PT Treatment/Interventions ADLs/Self Care Home Management;Cryotherapy;Electrical Stimulation;Iontophoresis 72m/ml  Dexamethasone;Moist Heat;Ultrasound;Functional mobility training;Stair training;Gait training;DME Instruction;Therapeutic activities;Therapeutic exercise;Balance training;Neuromuscular re-education;Manual techniques;Patient/family education;Scar mobilization;Passive range of motion;Dry needling;Energy conservation;Visual/perceptual remediation/compensation   PT Next Visit Plan stairs, progression of LE strength   PT Home Exercise Plan see sheet   Consulted and Agree with Plan of Care Patient;Family member/caregiver   Family Member Consulted wife      Patient will benefit from skilled therapeutic intervention in order to improve the following deficits and impairments:  Abnormal gait, Decreased activity tolerance, Decreased balance, Decreased knowledge of use of DME, Decreased endurance, Decreased mobility, Decreased range of motion, Decreased safety awareness, Decreased strength, Decreased scar mobility, Difficulty walking, Dizziness, Impaired perceived functional ability, Improper body mechanics, Postural dysfunction, Pain  Visit Diagnosis: Acute midline low back pain, with sciatica presence unspecified  Difficulty in walking, not elsewhere classified  Muscle weakness (generalized)     Problem List Patient Active Problem List   Diagnosis Date Noted  . Lymphedema 12/16/2015  . Chronic venous insufficiency 12/16/2015  . Leg swelling 12/16/2015  . 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 Anxiety 05/23/2015  . Acid reflux 05/23/2015  . HBV (hepatitis B virus) infection 05/23/2015  . History of cardiac catheterization 05/23/2015  . Combined fat and carbohydrate induced hyperlipemia 05/23/2015  . Arteriosclerosis of coronary artery 05/23/2015  . Clinical depression 05/23/2015  . Cough 07/26/2014  . Benign essential HTN 07/16/2014  . Breathlessness on exertion 02/12/2014  . Ischemic embolic stroke ([place]ett 005/69/7948 . Bundle branch block, right 05/29/2012  . History of  knee surgery 05/26/2012   MJanna Arch PT, DPT   MJanna Arch9/17/2018, 10:18 AM  [place] [facility] [address] [address]Phone: [phone]  Fax:  [phone] Name: JFREDIE MAJANOMRN: [id number] of Birth: [birth year]

## 2016-11-18 ENCOUNTER — Ambulatory Visit: Payer: Medicare Other

## 2016-11-18 DIAGNOSIS — M545 Low back pain: Secondary | ICD-10-CM

## 2016-11-18 DIAGNOSIS — R262 Difficulty in walking, not elsewhere classified: Secondary | ICD-10-CM

## 2016-11-18 DIAGNOSIS — M6281 Muscle weakness (generalized): Secondary | ICD-10-CM

## 2016-11-18 NOTE — Therapy (Signed)
[place] MAIN [facility] SERVICES [address] [place], [place], [address] Phone: [phone]   Fax:  [phone]  Physical Therapy Treatment  Patient Details  Name: Johnny Mccoy MRN: [id number] Date of Birth: 1945/05/03 Referring Provider: Dr. Launa Grill  Encounter Date: 11/18/2016      PT End of Session - 11/18/16 0944    Visit Number 13   Number of Visits 24   Date for PT Re-Evaluation 12/21/16   Authorization - Visit Number 3   Authorization - Number of Visits 10   PT Start Time 0930   PT Stop Time 1015   PT Time Calculation (min) 45 min   Equipment Utilized During Treatment Gait belt   Activity Tolerance Patient tolerated treatment well   Behavior During Therapy [facility] for tasks assessed/performed      Past Medical History:  Diagnosis Date  . Anxiety    takes Lorazepam daily  . Arthritis   . Asthma    Albuterol as needed.Breo daily as well as Flovent  . CAD (coronary artery disease)   . Cataract   . Cataracts, bilateral    immature  . Chronic back pain    compression  . Complication of anesthesia    seizures after partial knee in 2014.Surgery was in am and an still in post op until 9 that night. Was sent to [facility] for 5 days d/t uncontrollable shaking,incoherant  . Depressed    takes Pristiq daily  . Essential tremor   . GERD (gastroesophageal reflux disease)    takes Dexilant daily as well as Zantac  . 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 History of bronchitis    Jan-Mar 2017  . History of hiatal hernia   . Hyperlipidemia    takes Atorvastatin daily  . Hypertension    takes HCTZ,Amlodipine,and Metoprolol daily  . Insomnia    takes Trazodone nightly   . Joint pain   . Muscle spasm    in neck.Takes Flexeril as needed  . Numbness    right knee,right hand,and toes on left  . Seizures ([place])    takes Lamotrigine daily. Last seizure 6+ yrs ago    Past Surgical History:  Procedure Laterality Date  . CARDIAC  CATHETERIZATION     2015  . CARPAL TUNNEL RELEASE    . COLONOSCOPY    . CORONARY ANGIOPLASTY WITH STENT PLACEMENT     x 3  . dental implants    . ESOPHAGOGASTRODUODENOSCOPY    . EYE SURGERY Bilateral   . EYE SURGERY     both eyes in 60's d/t muscle causing to squint  . HERNIA REPAIR     umbilical  . JOINT REPLACEMENT Right    knee  . KNEE ARTHROSCOPY    . MEDIAL PARTIAL KNEE REPLACEMENT Right   . MEDIASTERNOTOMY N/A 10/28/2015   Procedure: PARTIAL STERNOTOMY;  Surgeon: [name], MD;  Location: [place];  Service: Thoracic;  Laterality: N/A;  . numerous hand surgery    . 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   Procedure: RESECTION OF anterier MEDIASTINAL TUMOR;  Surgeon: [name], MD;  Location: [place];  Service: Thoracic;  Laterality: N/A;  . right hand fusion    . 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 TONSILLECTOMY      There were no vitals filed for this visit.      Subjective Assessment - 11/18/16 0935    Subjective  Patient went to [facility] in [place] yesterday and walked around for about 2 hours. Had some muscle tighness upon return home.    Patient is accompained by: Family member   Pertinent History Pt is a pleasant 71 year old male who presents s/p L4-S1 laminectomy/fusion on 09/23/16 and was discharged yesterday from the hospital. His back pain started in April and he has had a complicated history since then involving multiple falls in May due to overmedication from conflicting medications. Prior to April he was independent with all tasks.  He is having difficulty adapting to home due to having a lot of area rugs that are different heights, running obstacle course from objects in house while in the home. The bed at home is higher then he is used to. Patient wants to return to gardening and is currently on blood thinner for 6 months and is a fall risk.    Limitations Sitting;Lifting;Standing;Walking;House hold activities;Other (comment)    How long can you sit comfortably? hour   How long can you stand comfortably? 30 minutes   How long can you walk comfortably? 10 minutes   Diagnostic tests surgery   Patient Stated Goals returning to gardening, like to strengthen, feel more confident in ambulation, decrease fear of fall, balance, get back to driving   Currently in Pain? Yes   Pain Score 1    Pain Location Back   Pain Descriptors / Indicators Aching   Pain Type Surgical pain   Pain Onset 1 to 4 weeks ago   Pain Frequency Constant      Manual PROM: hamstring, hamstring with df overpressure, popliteal angle, ab, add hold for 60 seconds   TherEx Partial squats 16x at // bars  TA contraction with Swiss ball push pull 10x 5 seconds Modified Dead bug swiss ball 15x Heel slides 12x each leg Clam shells and reverse clam shells 12x each, bilateral legs Straight leg raise 12x each leg  Bend knee fallout Ankle pumps  Bridges 10x , cues for gluteal strength Standing abduction RTB around ankles 10x each leg  Standing forward flexion RTB around ankles 10x each leg Standing extension RTB around ankles 10x each leg.  Step over and back over hurdle, 10x each leg in // bars  Seated hamstring stretch bilateral legs 60 seconds Standing posture against wall 5x20 seconds   Ambulating in hallway reading cards with horizontal head turns CGA, . Cues for arm swing           All exercises performed on R and l legs  Pt. response to medical necessity: Patient will benefit from skilled physical therapy to improve strength, mobility, ambulation, and decrease fall risk to allow for return to prior level of function                             PT Education - 11/18/16 0944    Education provided Yes   Education Details body mechanics for protection of spine   Person(s) Educated Patient   Methods Explanation;Demonstration   Comprehension Verbalized understanding;Returned demonstration          PT Short  Term Goals - 11/09/16 0949      PT SHORT TERM GOAL #1   Title Patient will ambulate with cane for 100 ft to allow for more independent mobilization   Baseline 8/9 walk with walker 8/22: 30 ft with quad cane  9/10: 1310 ft no cane   Time 2   Period Weeks  Status Achieved     PT SHORT TERM GOAL #2   Title Patient will report a worst pain of 4/10 on VAS to improve tolerance with ADLs and reduced symptoms with activities.    Baseline 8/9: pain worst 8/10 8/22 worst pain 7/10 9/10: 3/10   Time 2   Period Weeks   Status Achieved     PT SHORT TERM GOAL #3   Title Patient will increase 10 meter walk test to >1.785ms as to improve gait speed for better community ambulation and to reduce fall risk.   Baseline 8/9: 1.020m 9/10: 1.85m1mw/o cane   Time 2   Period Weeks   Status Achieved     PT SHORT TERM GOAL #4   Title Patient will walk without cane outside with no LOB to increase independent ambulation   Baseline Patient requires use of cane to ambulate outside   Time 2   Period Weeks   Status New     PT SHORT TERM GOAL #5   Title Patient will return to gardening without LOB or back pain to return to previous level of activity   Baseline 9/10: unable to garden at this time   Time 6   Period Weeks   Status New   Target Date 12/21/16           PT Long Term Goals - 11/09/16 0943      PT LONG TERM GOAL #1   Title Patient will be independent in home exercise program to improve strength/mobility for better functional independence with ADLs.   Baseline proficient with HEP   Time 6   Period Weeks   Status Partially Met     PT LONG TERM GOAL #2   Title Patient will decrease FABQ score to < 40/96 to demonstrate improved quality of life.    Baseline 8/9: 66/96 9/10: 55/96   Time 6   Period Weeks   Status Partially Met     PT LONG TERM GOAL #3   Title Patient will reduce timed up and go to <11 seconds to reduce fall risk and demonstrate improved transfer/gait ability.   Baseline  8/9: 21 seconds 9/10: 9 seconds with no cane    Time 6   Period Weeks   Status Achieved     PT LONG TERM GOAL #4   Title Patient will report a worst pain of 3/10 on VAS in   to improve tolerance with ADLs and reduced symptoms with activities.    Baseline 8/9: worst 8-9/10 pain; 9/10 : worst pain 3/10   Time 6   Period Weeks   Status Achieved     PT LONG TERM GOAL #5   Title Patient will report average pain of 0/10 on VAS in  to improve tolerance with ADLs and reduced symptoms with activities.    Baseline avg pain 3/10   Time 6   Period Weeks   Status New   Target Date 12/21/16     Additional Long Term Goals   Additional Long Term Goals Yes               Plan - 11/18/16 1009    Clinical Impression Statement Patient progressing with functional strength of LE's and abdomen with good control and knowledge of back awareness. Patient continues to require cues for task orientation and sequencing of cross body tasks. Pt. response to medical necessity: Patient will benefit from skilled physical therapy to improve strength, mobility, ambulation, and decrease fall risk to allow  for return to prior level of function   Rehab Potential Good   Clinical Impairments Affecting Rehab Potential CAD (coronary artery disease) , Hyperlipidemia, mixed , Amnesia , Hepatitis B , GERD (gastroesophageal reflux disease) , Anxiety, unspecified , Depression, unspecified , Hx of cardiac catheterization , Right bundle branch block , Valvular heart disease,  Benign essential hypertension,  Thymus neoplasm , Seizure (CMS-HCC) ,Thrombocytopenia (CMS-HCC) , Spondylolisthesis of lumbar region Asthma, unspecified OSA (obstructive sleep apnea) , Stroke (CMS-HCC), Ischemic embolic stroke (CMS-HCC) , Lymphedema , Meibomian gland dysfunction, Myopia, Presbyopia,  Spinal stenosis , Thymoma S/P lumbar laminectomy   PT Frequency 2x / week   PT Duration 6 weeks   PT Treatment/Interventions ADLs/Self Care Home  Management;Cryotherapy;Electrical Stimulation;Iontophoresis 73m/ml Dexamethasone;Moist Heat;Ultrasound;Functional mobility training;Stair training;Gait training;DME Instruction;Therapeutic activities;Therapeutic exercise;Balance training;Neuromuscular re-education;Manual techniques;Patient/family education;Scar mobilization;Passive range of motion;Dry needling;Energy conservation;Visual/perceptual remediation/compensation   PT Next Visit Plan progression of TA and LE strength, gait outside if weather permits   PT Home Exercise Plan see sheet   Consulted and Agree with Plan of Care Patient;Family member/caregiver   Family Member Consulted wife      Patient will benefit from skilled therapeutic intervention in order to improve the following deficits and impairments:  Abnormal gait, Decreased activity tolerance, Decreased balance, Decreased knowledge of use of DME, Decreased endurance, Decreased mobility, Decreased range of motion, Decreased safety awareness, Decreased strength, Decreased scar mobility, Difficulty walking, Dizziness, Impaired perceived functional ability, Improper body mechanics, Postural dysfunction, Pain  Visit Diagnosis: Acute midline low back pain, with sciatica presence unspecified  Difficulty in walking, not elsewhere classified  Muscle weakness (generalized)     Problem List Patient Active Problem List   Diagnosis Date Noted  . Lymphedema 12/16/2015  . Chronic venous insufficiency 12/16/2015  . Leg swelling 12/16/2015  . 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 Anxiety 05/23/2015  . Acid reflux 05/23/2015  . HBV (hepatitis B virus) infection 05/23/2015  . History of cardiac catheterization 05/23/2015  . Combined fat and carbohydrate induced hyperlipemia 05/23/2015  . Arteriosclerosis of coronary artery 05/23/2015  . Clinical depression 05/23/2015  . Cough 07/26/2014  . Benign essential HTN 07/16/2014  . Breathlessness on exertion 02/12/2014  .  Ischemic embolic stroke (HMoffat 089/37/3428 . Bundle branch block, right 05/29/2012  . History of knee surgery 05/26/2012   MJanna Arch PT, DPT   MJanna Arch9/19/2018, 10:24 AM  [place]MAIN [facility]SERV[facility] [address]RR[address] [place] [address]Phone: [phone]  Fax:  [phone] Name: Johnny GRUDZIENMRN: [id number] of Birth: 3September 09, 1947

## 2016-11-23 ENCOUNTER — Ambulatory Visit: Payer: Medicare Other

## 2016-11-23 VITALS — BP 140/73 | HR 69

## 2016-11-23 DIAGNOSIS — M545 Low back pain: Secondary | ICD-10-CM

## 2016-11-23 DIAGNOSIS — R262 Difficulty in walking, not elsewhere classified: Secondary | ICD-10-CM

## 2016-11-23 DIAGNOSIS — M6281 Muscle weakness (generalized): Secondary | ICD-10-CM

## 2016-11-23 NOTE — Therapy (Signed)
[place] MAIN [facility] SERVICES [address] [place], [place], [address] Phone: [phone]   Fax:  [phone]  Physical Therapy Treatment  Patient Details  Name: Johnny Mccoy MRN: [id number] Date of Birth: 08/12/1945 Referring Provider: Dr. Launa Grill  Encounter Date: 11/23/2016      PT End of Session - 11/23/16 0922    Visit Number 14   Number of Visits 24   Date for PT Re-Evaluation 12/21/16   Authorization - Visit Number 4   Authorization - Number of Visits 10   PT Start Time 0930   PT Stop Time 1015   PT Time Calculation (min) 45 min   Equipment Utilized During Treatment Gait belt   Activity Tolerance Patient tolerated treatment well   Behavior During Therapy [facility] for tasks assessed/performed      Past Medical History:  Diagnosis Date  . Anxiety    takes Lorazepam daily  . Arthritis   . Asthma    Albuterol as needed.Breo daily as well as Flovent  . CAD (coronary artery disease)   . Cataract   . Cataracts, bilateral    immature  . Chronic back pain    compression  . Complication of anesthesia    seizures after partial knee in 2014.Surgery was in am and an still in post op until 9 that night. Was sent to [facility] for 5 days d/t uncontrollable shaking,incoherant  . Depressed    takes Pristiq daily  . Essential tremor   . GERD (gastroesophageal reflux disease)    takes Dexilant daily as well as Zantac  . 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 History of bronchitis    Jan-Mar 2017  . History of hiatal hernia   . Hyperlipidemia    takes Atorvastatin daily  . Hypertension    takes HCTZ,Amlodipine,and Metoprolol daily  . Insomnia    takes Trazodone nightly   . Joint pain   . Muscle spasm    in neck.Takes Flexeril as needed  . Numbness    right knee,right hand,and toes on left  . Seizures ([place])    takes Lamotrigine daily. Last seizure 6+ yrs ago    Past Surgical History:  Procedure Laterality Date  . CARDIAC  CATHETERIZATION     2015  . CARPAL TUNNEL RELEASE    . COLONOSCOPY    . CORONARY ANGIOPLASTY WITH STENT PLACEMENT     x 3  . dental implants    . ESOPHAGOGASTRODUODENOSCOPY    . EYE SURGERY Bilateral   . EYE SURGERY     both eyes in 60's d/t muscle causing to squint  . HERNIA REPAIR     umbilical  . JOINT REPLACEMENT Right    knee  . KNEE ARTHROSCOPY    . MEDIAL PARTIAL KNEE REPLACEMENT Right   . MEDIASTERNOTOMY N/A 10/28/2015   Procedure: PARTIAL STERNOTOMY;  Surgeon: [name], MD;  Location: [place];  Service: Thoracic;  Laterality: N/A;  . numerous hand surgery    . 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   Procedure: RESECTION OF anterier MEDIASTINAL TUMOR;  Surgeon: [name], MD;  Location: [place];  Service: Thoracic;  Laterality: N/A;  . right hand fusion    . 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 TONSILLECTOMY      Vitals:   11/23/16 0934  BP: 140/73  Pulse: 69  SpO2: 97%        Subjective  Assessment - 11/23/16 0921    Subjective Pt complaining of being a little stiff today in his back. He reports 2/10 R low back pain. HEP is going well without any questions or concerns.    Patient is accompained by: Family member   Pertinent History Pt is a pleasant 71 year old male who presents s/p L4-S1 laminectomy/fusion on 09/23/16 and was discharged yesterday from the hospital. His back pain started in April and he has had a complicated history since then involving multiple falls in May due to overmedication from conflicting medications. Prior to April he was independent with all tasks.  He is having difficulty adapting to home due to having a lot of area rugs that are different heights, running obstacle course from objects in house while in the home. The bed at home is higher then he is used to. Patient wants to return to gardening and is currently on blood thinner for 6 months and is a fall risk.    Limitations  Sitting;Lifting;Standing;Walking;House hold activities;Other (comment)   How long can you sit comfortably? hour   How long can you stand comfortably? 30 minutes   How long can you walk comfortably? 10 minutes   Diagnostic tests surgery   Patient Stated Goals returning to gardening, like to strengthen, feel more confident in ambulation, decrease fear of fall, balance, get back to driving   Currently in Pain? Yes   Pain Score 2    Pain Location Back   Pain Orientation Right;Lower   Pain Descriptors / Indicators Aching;Tightness   Pain Type Surgical pain   Pain Onset 1 to 4 weeks ago   Pain Frequency Constant          Treatment   Manual  Hamstring stretches 30s hold x 3 bilateral; Hip IR/ER stretches by therapist 20s hold x 2 each bilateral;  TherEx  Warm-up on x-Ride L2 x 5 minutes for warm-up during history (unbilled); Ant/post pelvic tilts 5s hold x 10 each direction; TrA contraction with heel slides x 10 each leg; TrA contraction with bent knee fallout x 10 bilateral; TrA contraction in hooklying with pball press down against knees 10s hold x 10; Standing pallof press with Matrix 7.5# x 10 each direction; Squats x 10 with education and verbal cues for proper squat technique; Standing hip abduction RTB around ankles x 10 bilateral;  Standing forward hip flexion RTB around ankles x 10 bilateral; Standing hip extension RTB around ankles x 10 bilateral;  Pt. response to medical necessity: Patient will benefit from skilled physical therapy to improve strength, mobility, ambulation, and decrease fall risk to allow for return to prior level of function                         PT Education - 11/23/16 0921    Education provided Yes   Education Details Exercise form/technique   Person(s) Educated Patient   Methods Explanation;Demonstration   Comprehension Verbalized understanding          PT Short Term Goals - 11/09/16 0949      PT SHORT TERM GOAL #1    Title Patient will ambulate with cane for 100 ft to allow for more independent mobilization   Baseline 8/9 walk with walker 8/22: 30 ft with quad cane  9/10: 1310 ft no cane   Time 2   Period Weeks   Status Achieved     PT SHORT TERM GOAL #2   Title Patient will report a  worst pain of 4/10 on VAS to improve tolerance with ADLs and reduced symptoms with activities.    Baseline 8/9: pain worst 8/10 8/22 worst pain 7/10 9/10: 3/10   Time 2   Period Weeks   Status Achieved     PT SHORT TERM GOAL #3   Title Patient will increase 10 meter walk test to >1.67ms as to improve gait speed for better community ambulation and to reduce fall risk.   Baseline 8/9: 1.07m 9/10: 1.16m41mw/o cane   Time 2   Period Weeks   Status Achieved     PT SHORT TERM GOAL #4   Title Patient will walk without cane outside with no LOB to increase independent ambulation   Baseline Patient requires use of cane to ambulate outside   Time 2   Period Weeks   Status New     PT SHORT TERM GOAL #5   Title Patient will return to gardening without LOB or back pain to return to previous level of activity   Baseline 9/10: unable to garden at this time   Time 6   Period Weeks   Status New   Target Date 12/21/16           PT Long Term Goals - 11/09/16 0943      PT LONG TERM GOAL #1   Title Patient will be independent in home exercise program to improve strength/mobility for better functional independence with ADLs.   Baseline proficient with HEP   Time 6   Period Weeks   Status Partially Met     PT LONG TERM GOAL #2   Title Patient will decrease FABQ score to < 40/96 to demonstrate improved quality of life.    Baseline 8/9: 66/96 9/10: 55/96   Time 6   Period Weeks   Status Partially Met     PT LONG TERM GOAL #3   Title Patient will reduce timed up and go to <11 seconds to reduce fall risk and demonstrate improved transfer/gait ability.   Baseline 8/9: 21 seconds 9/10: 9 seconds with no cane    Time 6    Period Weeks   Status Achieved     PT LONG TERM GOAL #4   Title Patient will report a worst pain of 3/10 on VAS in   to improve tolerance with ADLs and reduced symptoms with activities.    Baseline 8/9: worst 8-9/10 pain; 9/10 : worst pain 3/10   Time 6   Period Weeks   Status Achieved     PT LONG TERM GOAL #5   Title Patient will report average pain of 0/10 on VAS in  to improve tolerance with ADLs and reduced symptoms with activities.    Baseline avg pain 3/10   Time 6   Period Weeks   Status New   Target Date 12/21/16     Additional Long Term Goals   Additional Long Term Goals Yes               Plan - 11/23/16 1003    Clinical Impression Statement Pt does well with all exercises today denying any increase in pain. Good pelvic control demonstrated with pelvic tilts and bend knee fall outs. He asks [facility] encouraged him to discuss with his neurosurgeon. Pt encouraged to continue HEP and follow-up as scheduled. He will continue to benefit from skilled physical therapy to improve strength, mobility, ambulation, and decrease  fall risk to allow for return to prior level of function   Clinical Presentation Evolving   Clinical Decision Making Moderate   Rehab Potential Good   Clinical Impairments Affecting Rehab Potential CAD (coronary artery disease) , Hyperlipidemia, mixed , Amnesia , Hepatitis B , GERD (gastroesophageal reflux disease) , Anxiety, unspecified , Depression, unspecified , Hx of cardiac catheterization , Right bundle branch block , Valvular heart disease,  Benign essential hypertension,  Thymus neoplasm , Seizure (CMS-HCC) ,Thrombocytopenia (CMS-HCC) , Spondylolisthesis of lumbar region Asthma, unspecified OSA (obstructive sleep apnea) , Stroke (CMS-HCC), Ischemic embolic stroke (CMS-HCC) , Lymphedema , Meibomian gland dysfunction, Myopia, Presbyopia,  Spinal stenosis , Thymoma S/P lumbar laminectomy    PT Frequency 2x / week   PT Duration 6 weeks   PT Treatment/Interventions ADLs/Self Care Home Management;Cryotherapy;Electrical Stimulation;Iontophoresis 36m/ml Dexamethasone;Moist Heat;Ultrasound;Functional mobility training;Stair training;Gait training;DME Instruction;Therapeutic activities;Therapeutic exercise;Balance training;Neuromuscular re-education;Manual techniques;Patient/family education;Scar mobilization;Passive range of motion;Dry needling;Energy conservation;Visual/perceptual remediation/compensation   PT Next Visit Plan progression of TA and LE strength, gait outside if weather permits   PT Home Exercise Plan see sheet   Consulted and Agree with Plan of Care Patient;Family member/caregiver   Family Member Consulted wife      Patient will benefit from skilled therapeutic intervention in order to improve the following deficits and impairments:  Abnormal gait, Decreased activity tolerance, Decreased balance, Decreased knowledge of use of DME, Decreased endurance, Decreased mobility, Decreased range of motion, Decreased safety awareness, Decreased strength, Decreased scar mobility, Difficulty walking, Dizziness, Impaired perceived functional ability, Improper body mechanics, Postural dysfunction, Pain  Visit Diagnosis: Acute midline low back pain, with sciatica presence unspecified  Difficulty in walking, not elsewhere classified  Muscle weakness (generalized)     Problem List Patient Active Problem List   Diagnosis Date Noted  . Lymphedema 12/16/2015  . Chronic venous insufficiency 12/16/2015  . Leg swelling 12/16/2015  . 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 Anxiety 05/23/2015  . Acid reflux 05/23/2015  . HBV (hepatitis B virus) infection 05/23/2015  . History of cardiac catheterization 05/23/2015  . Combined fat and carbohydrate induced hyperlipemia 05/23/2015  . Arteriosclerosis of coronary artery 05/23/2015  . Clinical depression 05/23/2015  . Cough  07/26/2014  . Benign essential HTN 07/16/2014  . Breathlessness on exertion 02/12/2014  . Ischemic embolic stroke ([place] 057/84/6962 . Bundle branch block, right 05/29/2012  . History of knee surgery 05/26/2012   JPhillips GroutPT, DPT   Huprich,Jason 11/23/2016, 10:23 AM  [place] [facility] [place] [address] [place] [address]Phone: [phone]  Fax:  [phone] Name: Johnny MUSIALMRN: [id number] of Birth: [birth year]

## 2016-11-25 ENCOUNTER — Ambulatory Visit: Payer: Medicare Other

## 2016-11-25 DIAGNOSIS — M6281 Muscle weakness (generalized): Secondary | ICD-10-CM

## 2016-11-25 DIAGNOSIS — M545 Low back pain: Secondary | ICD-10-CM | POA: Diagnosis not present

## 2016-11-25 DIAGNOSIS — R262 Difficulty in walking, not elsewhere classified: Secondary | ICD-10-CM

## 2016-11-25 NOTE — Therapy (Signed)
[place] MAIN [facility] SERVICES [address] [place], [place], [address] Phone: [phone]   Fax:  [phone]  Physical Therapy Treatment  Patient Details  Name: Johnny Mccoy MRN: [id number] Date of Birth: 09-16-1945 Referring Provider: Dr. Launa Grill  Encounter Date: 11/25/2016      PT End of Session - 11/25/16 0943    Visit Number 15   Number of Visits 24   Date for PT Re-Evaluation 12/21/16   Authorization - Visit Number 5   Authorization - Number of Visits 10   PT Start Time 0930   PT Stop Time 1015   PT Time Calculation (min) 45 min   Equipment Utilized During Treatment Gait belt   Activity Tolerance Patient tolerated treatment well   Behavior During Therapy [facility] for tasks assessed/performed      Past Medical History:  Diagnosis Date  . Anxiety    takes Lorazepam daily  . Arthritis   . Asthma    Albuterol as needed.Breo daily as well as Flovent  . CAD (coronary artery disease)   . Cataract   . Cataracts, bilateral    immature  . Chronic back pain    compression  . Complication of anesthesia    seizures after partial knee in 2014.Surgery was in am and an still in post op until 9 that night. Was sent to [facility] for 5 days d/t uncontrollable shaking,incoherant  . Depressed    takes Pristiq daily  . Essential tremor   . GERD (gastroesophageal reflux disease)    takes Dexilant daily as well as Zantac  . 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 History of bronchitis    Jan-Mar 2017  . History of hiatal hernia   . Hyperlipidemia    takes Atorvastatin daily  . Hypertension    takes HCTZ,Amlodipine,and Metoprolol daily  . Insomnia    takes Trazodone nightly   . Joint pain   . Muscle spasm    in neck.Takes Flexeril as needed  . Numbness    right knee,right hand,and toes on left  . Seizures ([place])    takes Lamotrigine daily. Last seizure 6+ yrs ago    Past Surgical History:  Procedure Laterality Date  . CARDIAC  CATHETERIZATION     2015  . CARPAL TUNNEL RELEASE    . COLONOSCOPY    . CORONARY ANGIOPLASTY WITH STENT PLACEMENT     x 3  . dental implants    . ESOPHAGOGASTRODUODENOSCOPY    . EYE SURGERY Bilateral   . EYE SURGERY     both eyes in 60's d/t muscle causing to squint  . HERNIA REPAIR     umbilical  . JOINT REPLACEMENT Right    knee  . KNEE ARTHROSCOPY    . MEDIAL PARTIAL KNEE REPLACEMENT Right   . MEDIASTERNOTOMY N/A 10/28/2015   Procedure: PARTIAL STERNOTOMY;  Surgeon: [name], MD;  Location: [place];  Service: Thoracic;  Laterality: N/A;  . numerous hand surgery    . 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   Procedure: RESECTION OF anterier MEDIASTINAL TUMOR;  Surgeon: [name], MD;  Location: [place];  Service: Thoracic;  Laterality: N/A;  . right hand fusion    . 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 TONSILLECTOMY      There were no vitals filed for this visit.      Subjective Assessment - 11/25/16 0934    Subjective  Patient is having lack of sleep, excessive tingling in hands and feet occuuring. Cutting back on medication because thinking it is making it worse. Pounding of ears happening.    Patient is accompained by: Family member   Pertinent History Pt is a pleasant 71 year old male who presents s/p L4-S1 laminectomy/fusion on 09/23/16 and was discharged yesterday from the hospital. His back pain started in April and he has had a complicated history since then involving multiple falls in May due to overmedication from conflicting medications. Prior to April he was independent with all tasks.  He is having difficulty adapting to home due to having a lot of area rugs that are different heights, running obstacle course from objects in house while in the home. The bed at home is higher then he is used to. Patient wants to return to gardening and is currently on blood thinner for 6 months and is a fall risk.    Limitations  Sitting;Lifting;Standing;Walking;House hold activities;Other (comment)   How long can you sit comfortably? hour   How long can you stand comfortably? 30 minutes   How long can you walk comfortably? 10 minutes   Diagnostic tests surgery   Patient Stated Goals returning to gardening, like to strengthen, feel more confident in ambulation, decrease fear of fall, balance, get back to driving   Currently in Pain? Yes   Pain Score 2    Pain Location Back   Pain Orientation Right;Lower   Pain Descriptors / Indicators Aching   Pain Type Surgical pain   Pain Onset 1 to 4 weeks ago      Hamstring stretches 40s hold x 3 bilateral; Hip IR/ER stretches by therapist 20s hold x 2 each bilateral;   TherEx   Ant/post pelvic tilts 5s hold x 10 each direction; Modified dead bugs with swiss ball x 10 TrA contraction in hooklying with pball press down against knees 10s hold x 10; Squats with 5lb bar overhead press off plinth table  x 10 with education and verbal cues for proper squat technique; Matrix resisted walks: 7.5 lb forward, backwards, side, side walk x 3 each direction, PT holding onto machine and gait belt due to instability/ fear of falling.   Standing hip abduction RTB around ankles x 10 bilateral;  Standing forward hip flexion RTB around ankles x 10 bilateral; Standing hip extension RTB around ankles x 10 bilateral; Standing hamstring curls in mirror 10x each leg    Pt. response to medical necessity: Patient will benefit from skilled physical therapy to improve strength, mobility, ambulation, and decrease fall risk to allow for return to prior level of function                              PT Education - 11/25/16 0943    Education provided Yes   Education Details body mechanics for functional movement   Person(s) Educated Patient   Methods Explanation;Demonstration;Verbal cues   Comprehension Verbalized understanding;Returned demonstration          PT  Short Term Goals - 11/09/16 0949      PT SHORT TERM GOAL #1   Title Patient will ambulate with cane for 100 ft to allow for more independent mobilization   Baseline 8/9 walk with walker 8/22: 30 ft with quad cane  9/10: 1310 ft no cane   Time 2   Period Weeks   Status Achieved     PT SHORT TERM GOAL #2  Title Patient will report a worst pain of 4/10 on VAS to improve tolerance with ADLs and reduced symptoms with activities.    Baseline 8/9: pain worst 8/10 8/22 worst pain 7/10 9/10: 3/10   Time 2   Period Weeks   Status Achieved     PT SHORT TERM GOAL #3   Title Patient will increase 10 meter walk test to >1.476ms as to improve gait speed for better community ambulation and to reduce fall risk.   Baseline 8/9: 1.066m 9/10: 1.76m95mw/o cane   Time 2   Period Weeks   Status Achieved     PT SHORT TERM GOAL #4   Title Patient will walk without cane outside with no LOB to increase independent ambulation   Baseline Patient requires use of cane to ambulate outside   Time 2   Period Weeks   Status New     PT SHORT TERM GOAL #5   Title Patient will return to gardening without LOB or back pain to return to previous level of activity   Baseline 9/10: unable to garden at this time   Time 6   Period Weeks   Status New   Target Date 12/21/16           PT Long Term Goals - 11/09/16 0943      PT LONG TERM GOAL #1   Title Patient will be independent in home exercise program to improve strength/mobility for better functional independence with ADLs.   Baseline proficient with HEP   Time 6   Period Weeks   Status Partially Met     PT LONG TERM GOAL #2   Title Patient will decrease FABQ score to < 40/96 to demonstrate improved quality of life.    Baseline 8/9: 66/96 9/10: 55/96   Time 6   Period Weeks   Status Partially Met     PT LONG TERM GOAL #3   Title Patient will reduce timed up and go to <11 seconds to reduce fall risk and demonstrate improved transfer/gait ability.    Baseline 8/9: 21 seconds 9/10: 9 seconds with no cane    Time 6   Period Weeks   Status Achieved     PT LONG TERM GOAL #4   Title Patient will report a worst pain of 3/10 on VAS in   to improve tolerance with ADLs and reduced symptoms with activities.    Baseline 8/9: worst 8-9/10 pain; 9/10 : worst pain 3/10   Time 6   Period Weeks   Status Achieved     PT LONG TERM GOAL #5   Title Patient will report average pain of 0/10 on VAS in  to improve tolerance with ADLs and reduced symptoms with activities.    Baseline avg pain 3/10   Time 6   Period Weeks   Status New   Target Date 12/21/16     Additional Long Term Goals   Additional Long Term Goals Yes               Plan - 11/25/16 1027    Clinical Impression Statement Patient presents with excessive shaking from lack of sleep. Increased need for cues for task orientation due to fatigue.  Standing strengthening interventions progressed with patient demonstrating improved coordination and dynamic balance. Patient will benefit from skilled physical therapy to improve strength, mobility, ambulation, and decrease fall risk to allow for return to prior level of function   Rehab Potential Good   Clinical Impairments Affecting  Rehab Potential CAD (coronary artery disease) , Hyperlipidemia, mixed , Amnesia , Hepatitis B , GERD (gastroesophageal reflux disease) , Anxiety, unspecified , Depression, unspecified , Hx of cardiac catheterization , Right bundle branch block , Valvular heart disease,  Benign essential hypertension,  Thymus neoplasm , Seizure (CMS-HCC) ,Thrombocytopenia (CMS-HCC) , Spondylolisthesis of lumbar region Asthma, unspecified OSA (obstructive sleep apnea) , Stroke (CMS-HCC), Ischemic embolic stroke (CMS-HCC) , Lymphedema , Meibomian gland dysfunction, Myopia, Presbyopia,  Spinal stenosis , Thymoma S/P lumbar laminectomy   PT Frequency 2x / week   PT Duration 6 weeks   PT Treatment/Interventions ADLs/Self Care Home  Management;Cryotherapy;Electrical Stimulation;Iontophoresis 76m/ml Dexamethasone;Moist Heat;Ultrasound;Functional mobility training;Stair training;Gait training;DME Instruction;Therapeutic activities;Therapeutic exercise;Balance training;Neuromuscular re-education;Manual techniques;Patient/family education;Scar mobilization;Passive range of motion;Dry needling;Energy conservation;Visual/perceptual remediation/compensation   PT Next Visit Plan decrease to 1x/ week? assess for need to decrease?   PT Home Exercise Plan see sheet   Consulted and Agree with Plan of Care Patient;Family member/caregiver   Family Member Consulted wife      Patient will benefit from skilled therapeutic intervention in order to improve the following deficits and impairments:  Abnormal gait, Decreased activity tolerance, Decreased balance, Decreased knowledge of use of DME, Decreased endurance, Decreased mobility, Decreased range of motion, Decreased safety awareness, Decreased strength, Decreased scar mobility, Difficulty walking, Dizziness, Impaired perceived functional ability, Improper body mechanics, Postural dysfunction, Pain  Visit Diagnosis: Acute midline low back pain, with sciatica presence unspecified  Difficulty in walking, not elsewhere classified  Muscle weakness (generalized)     Problem List Patient Active Problem List   Diagnosis Date Noted  . Lymphedema 12/16/2015  . Chronic venous insufficiency 12/16/2015  . Leg swelling 12/16/2015  . 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 Anxiety 05/23/2015  . Acid reflux 05/23/2015  . HBV (hepatitis B virus) infection 05/23/2015  . History of cardiac catheterization 05/23/2015  . Combined fat and carbohydrate induced hyperlipemia 05/23/2015  . Arteriosclerosis of coronary artery 05/23/2015  . Clinical depression 05/23/2015  . Cough 07/26/2014  . Benign essential HTN 07/16/2014  . Breathlessness on exertion 02/12/2014  . Ischemic embolic  stroke ([place] 019/41/7408 . Bundle branch block, right 05/29/2012  . History of knee surgery 05/26/2012  MJanna Arch PT, DPT    MJanna Arch9/26/2018, 10:28 AM  [place]MAIN RAdvent[facility]villeSERVICES [address] [address]Phone: [phone]  Fax:  [phone] Name: JZAKARIA SEDORMRN: [id number] of Birth: [birth year]

## 2016-11-30 DIAGNOSIS — Z86711 Personal history of pulmonary embolism: Secondary | ICD-10-CM | POA: Insufficient documentation

## 2016-12-01 ENCOUNTER — Ambulatory Visit: Payer: Medicare Other | Attending: Physical Medicine and Rehabilitation

## 2016-12-01 DIAGNOSIS — R262 Difficulty in walking, not elsewhere classified: Secondary | ICD-10-CM | POA: Diagnosis present

## 2016-12-01 DIAGNOSIS — M6281 Muscle weakness (generalized): Secondary | ICD-10-CM | POA: Diagnosis present

## 2016-12-01 DIAGNOSIS — M545 Low back pain: Secondary | ICD-10-CM | POA: Diagnosis present

## 2016-12-01 NOTE — Therapy (Signed)
[place] MAIN [facility] SERVICES [address] [place], [place], [address] Phone: [phone]   Fax:  [phone]  Physical Therapy Treatment  Patient Details  Name: Johnny Mccoy MRN: [id number] Date of Birth: Nov 03, 1945 Referring Provider: Dr. Launa Grill  Encounter Date: 12/01/2016      PT End of Session - 12/01/16 0950    Visit Number 16   Number of Visits 24   Date for PT Re-Evaluation 12/21/16   Authorization - Visit Number 6   Authorization - Number of Visits 10   PT Start Time 0930   PT Stop Time 1015   PT Time Calculation (min) 45 min   Equipment Utilized During Treatment Gait belt   Activity Tolerance Patient tolerated treatment well   Behavior During Therapy [facility] for tasks assessed/performed      Past Medical History:  Diagnosis Date  . Anxiety    takes Lorazepam daily  . Arthritis   . Asthma    Albuterol as needed.Breo daily as well as Flovent  . CAD (coronary artery disease)   . Cataract   . Cataracts, bilateral    immature  . Chronic back pain    compression  . Complication of anesthesia    seizures after partial knee in 2014.Surgery was in am and an still in post op until 9 that night. Was sent to [facility] for 5 days d/t uncontrollable shaking,incoherant  . Depressed    takes Pristiq daily  . Essential tremor   . GERD (gastroesophageal reflux disease)    takes Dexilant daily as well as Zantac  . 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 History of bronchitis    Jan-Mar 2017  . History of hiatal hernia   . Hyperlipidemia    takes Atorvastatin daily  . Hypertension    takes HCTZ,Amlodipine,and Metoprolol daily  . Insomnia    takes Trazodone nightly   . Joint pain   . Muscle spasm    in neck.Takes Flexeril as needed  . Numbness    right knee,right hand,and toes on left  . Seizures ([place])    takes Lamotrigine daily. Last seizure 6+ yrs ago    Past Surgical History:  Procedure Laterality Date  . CARDIAC  CATHETERIZATION     2015  . CARPAL TUNNEL RELEASE    . COLONOSCOPY    . CORONARY ANGIOPLASTY WITH STENT PLACEMENT     x 3  . dental implants    . ESOPHAGOGASTRODUODENOSCOPY    . EYE SURGERY Bilateral   . EYE SURGERY     both eyes in 60's d/t muscle causing to squint  . HERNIA REPAIR     umbilical  . JOINT REPLACEMENT Right    knee  . KNEE ARTHROSCOPY    . MEDIAL PARTIAL KNEE REPLACEMENT Right   . MEDIASTERNOTOMY N/A 10/28/2015   Procedure: PARTIAL STERNOTOMY;  Surgeon: [name], MD;  Location: [place];  Service: Thoracic;  Laterality: N/A;  . numerous hand surgery    . 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   Procedure: RESECTION OF anterier MEDIASTINAL TUMOR;  Surgeon: [name], MD;  Location: [place];  Service: Thoracic;  Laterality: N/A;  . right hand fusion    . 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 TONSILLECTOMY      There were no vitals filed for this visit.      Subjective Assessment - 12/01/16 0934    Subjective  Patient had MRI to look for potential tumor in brain. Is weaning off of medications.    Patient is accompained by: Family member   Pertinent History Pt is a pleasant 71 year old male who presents s/p L4-S1 laminectomy/fusion on 09/23/16 and was discharged yesterday from the hospital. His back pain started in April and he has had a complicated history since then involving multiple falls in May due to overmedication from conflicting medications. Prior to April he was independent with all tasks.  He is having difficulty adapting to home due to having a lot of area rugs that are different heights, running obstacle course from objects in house while in the home. The bed at home is higher then he is used to. Patient wants to return to gardening and is currently on blood thinner for 6 months and is a fall risk.    Limitations Sitting;Lifting;Standing;Walking;House hold activities;Other (comment)   How long can you sit comfortably? hour    How long can you stand comfortably? 30 minutes   How long can you walk comfortably? 10 minutes   Diagnostic tests surgery   Patient Stated Goals returning to gardening, like to strengthen, feel more confident in ambulation, decrease fear of fall, balance, get back to driving   Currently in Pain? Yes   Pain Score 1    Pain Location Back   Pain Orientation Lower   Pain Descriptors / Indicators Aching   Pain Type Surgical pain   Pain Onset 1 to 4 weeks ago        Hamstring stretches 40s hold x 3 bilateral; Hip IR/ER stretches by therapist 20s hold x 2 each bilateral;R more tiht than left   TherEx    Ant/post pelvic tilts 5s hold x 10 each direction; Modified dead bugs with swiss ball x 16 Bridges 10x with cues for additional glute squeezes TrA contraction in hooklying with pball press down against knees 10s hold x 10; TrA contraction into Swiss ball with feet at 90 90 with leg drops 16x   Standing hamstring stretch on 4" step in // bars with BUE support 60sec each leg; Standing lunge stretch for hip flexor in // bars on 4" step 60 seconds each leg,   Standing hip abduction RTB around ankles x 10 bilateral;  Standing forward hip flexion RTB around ankles x 10 bilateral; Standing hip extension RTB around ankles x 10 bilateral; Standing hamstring curls in mirror 10x each leg  Standing heel raises with BUE support x15  Squat with BUE support in // bars 6" step ups 20x   Pt. response to medical necessity: Patient will benefit from skilled physical therapy to improve strength, mobility, ambulation, and decrease fall risk to allow for return to prior level of function                             PT Education - 12/01/16 0950    Education provided Yes   Education Details body  mechanics for functional movement   Person(s) Educated Patient   Methods Explanation;Demonstration;Verbal cues   Comprehension Verbalized understanding;Returned demonstration           PT Short Term Goals - 11/09/16 0949      PT SHORT TERM GOAL #1   Title Patient will ambulate with cane for 100 ft to allow for more independent mobilization   Baseline 8/9 walk with walker 8/22: 30 ft with quad cane  9/10: 1310 ft no cane  Time 2   Period Weeks   Status Achieved     PT SHORT TERM GOAL #2   Title Patient will report a worst pain of 4/10 on VAS to improve tolerance with ADLs and reduced symptoms with activities.    Baseline 8/9: pain worst 8/10 8/22 worst pain 7/10 9/10: 3/10   Time 2   Period Weeks   Status Achieved     PT SHORT TERM GOAL #3   Title Patient will increase 10 meter walk test to >1.35ms as to improve gait speed for better community ambulation and to reduce fall risk.   Baseline 8/9: 1.072m 9/10: 1.63m70mw/o cane   Time 2   Period Weeks   Status Achieved     PT SHORT TERM GOAL #4   Title Patient will walk without cane outside with no LOB to increase independent ambulation   Baseline Patient requires use of cane to ambulate outside   Time 2   Period Weeks   Status New     PT SHORT TERM GOAL #5   Title Patient will return to gardening without LOB or back pain to return to previous level of activity   Baseline 9/10: unable to garden at this time   Time 6   Period Weeks   Status New   Target Date 12/21/16           PT Long Term Goals - 11/09/16 0943      PT LONG TERM GOAL #1   Title Patient will be independent in home exercise program to improve strength/mobility for better functional independence with ADLs.   Baseline proficient with HEP   Time 6   Period Weeks   Status Partially Met     PT LONG TERM GOAL #2   Title Patient will decrease FABQ score to < 40/96 to demonstrate improved quality of life.    Baseline 8/9: 66/96 9/10: 55/96   Time 6   Period Weeks   Status Partially Met     PT LONG TERM GOAL #3   Title Patient will reduce timed up and go to <11 seconds to reduce fall risk and demonstrate improved transfer/gait  ability.   Baseline 8/9: 21 seconds 9/10: 9 seconds with no cane    Time 6   Period Weeks   Status Achieved     PT LONG TERM GOAL #4   Title Patient will report a worst pain of 3/10 on VAS in   to improve tolerance with ADLs and reduced symptoms with activities.    Baseline 8/9: worst 8-9/10 pain; 9/10 : worst pain 3/10   Time 6   Period Weeks   Status Achieved     PT LONG TERM GOAL #5   Title Patient will report average pain of 0/10 on VAS in  to improve tolerance with ADLs and reduced symptoms with activities.    Baseline avg pain 3/10   Time 6   Period Weeks   Status New   Target Date 12/21/16     Additional Long Term Goals   Additional Long Term Goals Yes               Plan - 12/01/16 1021    Clinical Impression Statement Patient progressing with abdominal strength and LE control. LE and lumbar musculature is tight and relieved upon manual. Patient will be progressed towards more independent based program for 1x/week to assess ability to perform more independent program.  Patient will benefit from skilled physical therapy to  improve strength, mobility, ambulation, and decrease fall risk to allow for return to prior level of function   Rehab Potential Good   Clinical Impairments Affecting Rehab Potential CAD (coronary artery disease) , Hyperlipidemia, mixed , Amnesia , Hepatitis B , GERD (gastroesophageal reflux disease) , Anxiety, unspecified , Depression, unspecified , Hx of cardiac catheterization , Right bundle branch block , Valvular heart disease,  Benign essential hypertension,  Thymus neoplasm , Seizure (CMS-HCC) ,Thrombocytopenia (CMS-HCC) , Spondylolisthesis of lumbar region Asthma, unspecified OSA (obstructive sleep apnea) , Stroke (CMS-HCC), Ischemic embolic stroke (CMS-HCC) , Lymphedema , Meibomian gland dysfunction, Myopia, Presbyopia,  Spinal stenosis , Thymoma S/P lumbar laminectomy   PT Frequency 2x / week   PT Duration 6 weeks   PT Treatment/Interventions  ADLs/Self Care Home Management;Cryotherapy;Electrical Stimulation;Iontophoresis 60m/ml Dexamethasone;Moist Heat;Ultrasound;Functional mobility training;Stair training;Gait training;DME Instruction;Therapeutic activities;Therapeutic exercise;Balance training;Neuromuscular re-education;Manual techniques;Patient/family education;Scar mobilization;Passive range of motion;Dry needling;Energy conservation;Visual/perceptual remediation/compensation   PT Next Visit Plan decreased to 1x/week, new HEP   PT Home Exercise Plan see sheet   Consulted and Agree with Plan of Care Patient;Family member/caregiver   Family Member Consulted wife      Patient will benefit from skilled therapeutic intervention in order to improve the following deficits and impairments:  Abnormal gait, Decreased activity tolerance, Decreased balance, Decreased knowledge of use of DME, Decreased endurance, Decreased mobility, Decreased range of motion, Decreased safety awareness, Decreased strength, Decreased scar mobility, Difficulty walking, Dizziness, Impaired perceived functional ability, Improper body mechanics, Postural dysfunction, Pain  Visit Diagnosis: Acute midline low back pain, with sciatica presence unspecified  Difficulty in walking, not elsewhere classified  Muscle weakness (generalized)     Problem List Patient Active Problem List   Diagnosis Date Noted  . Lymphedema 12/16/2015  . Chronic venous insufficiency 12/16/2015  . Leg swelling 12/16/2015  . 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 Anxiety 05/23/2015  . Acid reflux 05/23/2015  . HBV (hepatitis B virus) infection 05/23/2015  . History of cardiac catheterization 05/23/2015  . Combined fat and carbohydrate induced hyperlipemia 05/23/2015  . Arteriosclerosis of coronary artery 05/23/2015  . Clinical depression 05/23/2015  . Cough 07/26/2014  . Benign essential HTN 07/16/2014  . Breathlessness on exertion 02/12/2014  . Ischemic embolic  stroke ([place] 003/54/6568 . Bundle branch block, right 05/29/2012  . History of knee surgery 05/26/2012   MJanna Arch PT, DPT   MJanna Arch10/04/2016, 10:21 AM  [place] [facility] CenterSERV[facility] [address] [place] [address]Phone: [phone]  Fax:  [phone] Name: JBRADELY RUDINMRN: [id number] of Birth: 309/22/1947

## 2016-12-03 ENCOUNTER — Ambulatory Visit: Payer: Medicare Other

## 2016-12-08 ENCOUNTER — Ambulatory Visit: Payer: Medicare Other

## 2016-12-08 DIAGNOSIS — M545 Low back pain: Secondary | ICD-10-CM

## 2016-12-08 DIAGNOSIS — M6281 Muscle weakness (generalized): Secondary | ICD-10-CM

## 2016-12-08 DIAGNOSIS — R262 Difficulty in walking, not elsewhere classified: Secondary | ICD-10-CM

## 2016-12-08 NOTE — Therapy (Signed)
[place] MAIN [facility] SERVICES [address] [place], [place], [address] Phone: [phone]   Fax:  [phone]  Physical Therapy Treatment  Patient Details  Name: Johnny Mccoy MRN: [id number] Date of Birth: 04/18/1945 Referring Provider: Dr. Launa Grill  Encounter Date: 12/08/2016      PT End of Session - 12/08/16 0939    Visit Number 17   Number of Visits 24   Date for PT Re-Evaluation 12/21/16   Authorization - Visit Number 7   Authorization - Number of Visits 10   PT Start Time 0930   PT Stop Time 1015   PT Time Calculation (min) 45 min   Equipment Utilized During Treatment Gait belt   Activity Tolerance Patient tolerated treatment well   Behavior During Therapy [facility] for tasks assessed/performed      Past Medical History:  Diagnosis Date  . Anxiety    takes Lorazepam daily  . Arthritis   . Asthma    Albuterol as needed.Breo daily as well as Flovent  . CAD (coronary artery disease)   . Cataract   . Cataracts, bilateral    immature  . Chronic back pain    compression  . Complication of anesthesia    seizures after partial knee in 2014.Surgery was in am and an still in post op until 9 that night. Was sent to [facility] for 5 days d/t uncontrollable shaking,incoherant  . Depressed    takes Pristiq daily  . Essential tremor   . GERD (gastroesophageal reflux disease)    takes Dexilant daily as well as Zantac  . 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 History of bronchitis    Jan-Mar 2017  . History of hiatal hernia   . Hyperlipidemia    takes Atorvastatin daily  . Hypertension    takes HCTZ,Amlodipine,and Metoprolol daily  . Insomnia    takes Trazodone nightly   . Joint pain   . Muscle spasm    in neck.Takes Flexeril as needed  . Numbness    right knee,right hand,and toes on left  . Seizures ([place])    takes Lamotrigine daily. Last seizure 6+ yrs ago    Past Surgical History:  Procedure Laterality Date  . CARDIAC  CATHETERIZATION     2015  . CARPAL TUNNEL RELEASE    . COLONOSCOPY    . CORONARY ANGIOPLASTY WITH STENT PLACEMENT     x 3  . dental implants    . ESOPHAGOGASTRODUODENOSCOPY    . EYE SURGERY Bilateral   . EYE SURGERY     both eyes in 60's d/t muscle causing to squint  . HERNIA REPAIR     umbilical  . JOINT REPLACEMENT Right    knee  . KNEE ARTHROSCOPY    . MEDIAL PARTIAL KNEE REPLACEMENT Right   . MEDIASTERNOTOMY N/A 10/28/2015   Procedure: PARTIAL STERNOTOMY;  Surgeon: [name], MD;  Location: [place];  Service: Thoracic;  Laterality: N/A;  . numerous hand surgery    . 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   Procedure: RESECTION OF anterier MEDIASTINAL TUMOR;  Surgeon: [name], MD;  Location: [place];  Service: Thoracic;  Laterality: N/A;  . right hand fusion    . 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 TONSILLECTOMY      There were no vitals filed for this visit.      Subjective Assessment - 12/08/16 0937    Subjective  Patient having R hip pain that occured 4-5 days ago, went to doctor yesterday and had imaging which was clear. Will get an injection this afternoon.    Patient is accompained by: Family member   Pertinent History Pt is a pleasant 71 year old male who presents s/p L4-S1 laminectomy/fusion on 09/23/16 and was discharged yesterday from the hospital. His back pain started in April and he has had a complicated history since then involving multiple falls in May due to overmedication from conflicting medications. Prior to April he was independent with all tasks.  He is having difficulty adapting to home due to having a lot of area rugs that are different heights, running obstacle course from objects in house while in the home. The bed at home is higher then he is used to. Patient wants to return to gardening and is currently on blood thinner for 6 months and is a fall risk.    Limitations Sitting;Lifting;Standing;Walking;House hold  activities;Other (comment)   How long can you sit comfortably? hour   How long can you stand comfortably? 30 minutes   How long can you walk comfortably? 10 minutes   Diagnostic tests surgery   Patient Stated Goals returning to gardening, like to strengthen, feel more confident in ambulation, decrease fear of fall, balance, get back to driving   Currently in Pain? Yes   Pain Score 3    Pain Location Hip   Pain Orientation Right   Pain Descriptors / Indicators Aching   Pain Type Acute pain   Pain Onset 1 to 4 weeks ago   Pain Frequency Intermittent   Aggravating Factors  jumps to 8/1o pain       Nustep lvl 4 3 minutes     Hamstring stretches 40s hold x 3 bilateral; Hip IR/ER stretches by therapist 20s hold x 2 each bilateral;R more tiht than left  Distraction to R hip SAD with belt lateral and inferior 5x 20 seconds   TherEx   SLR supine 15x LLE, unable to perform on RLE Ant/post pelvic tilts 5s hold x 10 each direction; Modified dead bugs with swiss ball x 16 Bridges 10x with cues for additional glute squeezes TrA contraction in hooklying with pball press down against knees 10s hold x 10; Hamstring curls swiss ball 15x  Supine abduction green theraband 15x with 5 second holds   supine marches 20x with GTB x 20  Weighted bar 5lb bar held over chest with heel slides for TrA activation 20x     Pt. response to medical necessity: Patient will benefit from skilled physical therapy to improve strength, mobility, ambulation, and decrease fall risk to allow for return to prior level of function                         PT Education - 12/08/16 0938    Education provided Yes   Education Details stretching body mechanics to decrease hip pain   Person(s) Educated Patient   Methods Explanation;Demonstration;Verbal cues   Comprehension Verbalized understanding;Returned demonstration          PT Short Term Goals - 11/09/16 0949      PT SHORT TERM GOAL #1    Title Patient will ambulate with cane for 100 ft to allow for more independent mobilization   Baseline 8/9 walk with walker 8/22: 30 ft with quad cane  9/10: 1310 ft no cane   Time 2   Period Weeks   Status Achieved  PT SHORT TERM GOAL #2   Title Patient will report a worst pain of 4/10 on VAS to improve tolerance with ADLs and reduced symptoms with activities.    Baseline 8/9: pain worst 8/10 8/22 worst pain 7/10 9/10: 3/10   Time 2   Period Weeks   Status Achieved     PT SHORT TERM GOAL #3   Title Patient will increase 10 meter walk test to >1.83ms as to improve gait speed for better community ambulation and to reduce fall risk.   Baseline 8/9: 1.0106m 9/10: 1.29m60mw/o cane   Time 2   Period Weeks   Status Achieved     PT SHORT TERM GOAL #4   Title Patient will walk without cane outside with no LOB to increase independent ambulation   Baseline Patient requires use of cane to ambulate outside   Time 2   Period Weeks   Status New     PT SHORT TERM GOAL #5   Title Patient will return to gardening without LOB or back pain to return to previous level of activity   Baseline 9/10: unable to garden at this time   Time 6   Period Weeks   Status New   Target Date 12/21/16           PT Long Term Goals - 11/09/16 0943      PT LONG TERM GOAL #1   Title Patient will be independent in home exercise program to improve strength/mobility for better functional independence with ADLs.   Baseline proficient with HEP   Time 6   Period Weeks   Status Partially Met     PT LONG TERM GOAL #2   Title Patient will decrease FABQ score to < 40/96 to demonstrate improved quality of life.    Baseline 8/9: 66/96 9/10: 55/96   Time 6   Period Weeks   Status Partially Met     PT LONG TERM GOAL #3   Title Patient will reduce timed up and go to <11 seconds to reduce fall risk and demonstrate improved transfer/gait ability.   Baseline 8/9: 21 seconds 9/10: 9 seconds with no cane    Time 6    Period Weeks   Status Achieved     PT LONG TERM GOAL #4   Title Patient will report a worst pain of 3/10 on VAS in   to improve tolerance with ADLs and reduced symptoms with activities.    Baseline 8/9: worst 8-9/10 pain; 9/10 : worst pain 3/10   Time 6   Period Weeks   Status Achieved     PT LONG TERM GOAL #5   Title Patient will report average pain of 0/10 on VAS in  to improve tolerance with ADLs and reduced symptoms with activities.    Baseline avg pain 3/10   Time 6   Period Weeks   Status New   Target Date 12/21/16     Additional Long Term Goals   Additional Long Term Goals Yes               Plan - 12/08/16 0956    Clinical Impression Statement Patient treatment limited by R hip pain. All weight bearing and stressful interventions had to be terminated due to increased pain. Focus on abdominal strengthening performed. RLE musculature excessively tight, decreased upon manual, continued to be painful post manual. Patient will benefit from skilled physical therapy to improve strength, mobility, ambulation, and decrease fall risk to allow for return to  prior level of function   Rehab Potential Good   Clinical Impairments Affecting Rehab Potential CAD (coronary artery disease) , Hyperlipidemia, mixed , Amnesia , Hepatitis B , GERD (gastroesophageal reflux disease) , Anxiety, unspecified , Depression, unspecified , Hx of cardiac catheterization , Right bundle branch block , Valvular heart disease,  Benign essential hypertension,  Thymus neoplasm , Seizure (CMS-HCC) ,Thrombocytopenia (CMS-HCC) , Spondylolisthesis of lumbar region Asthma, unspecified OSA (obstructive sleep apnea) , Stroke (CMS-HCC), Ischemic embolic stroke (CMS-HCC) , Lymphedema , Meibomian gland dysfunction, Myopia, Presbyopia,  Spinal stenosis , Thymoma S/P lumbar laminectomy   PT Frequency 2x / week   PT Duration 6 weeks   PT Treatment/Interventions ADLs/Self Care Home Management;Cryotherapy;Electrical  Stimulation;Iontophoresis 68m/ml Dexamethasone;Moist Heat;Ultrasound;Functional mobility training;Stair training;Gait training;DME Instruction;Therapeutic activities;Therapeutic exercise;Balance training;Neuromuscular re-education;Manual techniques;Patient/family education;Scar mobilization;Passive range of motion;Dry needling;Energy conservation;Visual/perceptual remediation/compensation   PT Next Visit Plan potential d/c, new HEP   PT Home Exercise Plan see sheet   Consulted and Agree with Plan of Care Patient;Family member/caregiver   Family Member Consulted wife      Patient will benefit from skilled therapeutic intervention in order to improve the following deficits and impairments:  Abnormal gait, Decreased activity tolerance, Decreased balance, Decreased knowledge of use of DME, Decreased endurance, Decreased mobility, Decreased range of motion, Decreased safety awareness, Decreased strength, Decreased scar mobility, Difficulty walking, Dizziness, Impaired perceived functional ability, Improper body mechanics, Postural dysfunction, Pain  Visit Diagnosis: Acute midline low back pain, with sciatica presence unspecified  Difficulty in walking, not elsewhere classified  Muscle weakness (generalized)     Problem List Patient Active Problem List   Diagnosis Date Noted  . Lymphedema 12/16/2015  . Chronic venous insufficiency 12/16/2015  . Leg swelling 12/16/2015  . 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 Anxiety 05/23/2015  . Acid reflux 05/23/2015  . HBV (hepatitis B virus) infection 05/23/2015  . History of cardiac catheterization 05/23/2015  . Combined fat and carbohydrate induced hyperlipemia 05/23/2015  . Arteriosclerosis of coronary artery 05/23/2015  . Clinical depression 05/23/2015  . Cough 07/26/2014  . Benign essential HTN 07/16/2014  . Breathlessness on exertion 02/12/2014  . Ischemic embolic stroke ([place] 059/[place]/3110 . Bundle branch block, right  05/29/2012  . History of knee surgery 05/26/2012   MJanna Arch PT, DPT   MJanna Arch10/10/2016, 10:20 AM  [place] [facility]SERV[facility] [address] [place] [address]Phone: [phone]  Fax:  [phone] Name: JSHAMEEK NYQUISTMRN: [id number] of Birth: 315-Feb-1947

## 2016-12-10 ENCOUNTER — Ambulatory Visit: Payer: Medicare Other

## 2016-12-15 ENCOUNTER — Ambulatory Visit: Payer: Medicare Other

## 2016-12-15 DIAGNOSIS — R262 Difficulty in walking, not elsewhere classified: Secondary | ICD-10-CM

## 2016-12-15 DIAGNOSIS — M6281 Muscle weakness (generalized): Secondary | ICD-10-CM

## 2016-12-15 DIAGNOSIS — M545 Low back pain: Secondary | ICD-10-CM | POA: Diagnosis not present

## 2016-12-15 NOTE — Therapy (Signed)
[place] MAIN [facility] SERVICES [address] [place], [place], [address] Phone: [phone]   Fax:  [phone]  Physical Therapy Treatment  Patient Details  Name: Johnny Mccoy MRN: [id number] Date of Birth: Mar 14, 1945 Referring Provider: Dr. Launa Grill  Encounter Date: 12/15/2016      PT End of Session - 12/15/16 0953    Visit Number 18   Number of Visits 24   Date for PT Re-Evaluation 12/21/16   Authorization - Visit Number 8   Authorization - Number of Visits 10   PT Start Time 0930   PT Stop Time 1015   PT Time Calculation (min) 45 min   Equipment Utilized During Treatment Gait belt   Activity Tolerance Patient tolerated treatment well   Behavior During Therapy [facility] for tasks assessed/performed      Past Medical History:  Diagnosis Date  . Anxiety    takes Lorazepam daily  . Arthritis   . Asthma    Albuterol as needed.Breo daily as well as Flovent  . CAD (coronary artery disease)   . Cataract   . Cataracts, bilateral    immature  . Chronic back pain    compression  . Complication of anesthesia    seizures after partial knee in 2014.Surgery was in am and an still in post op until 9 that night. Was sent to [facility] for 5 days d/t uncontrollable shaking,incoherant  . Depressed    takes Pristiq daily  . Essential tremor   . GERD (gastroesophageal reflux disease)    takes Dexilant daily as well as Zantac  . 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 History of bronchitis    Jan-Mar 2017  . History of hiatal hernia   . Hyperlipidemia    takes Atorvastatin daily  . Hypertension    takes HCTZ,Amlodipine,and Metoprolol daily  . Insomnia    takes Trazodone nightly   . Joint pain   . Muscle spasm    in neck.Takes Flexeril as needed  . Numbness    right knee,right hand,and toes on left  . Seizures ([place])    takes Lamotrigine daily. Last seizure 6+ yrs ago    Past Surgical History:  Procedure Laterality Date  . CARDIAC  CATHETERIZATION     2015  . CARPAL TUNNEL RELEASE    . COLONOSCOPY    . CORONARY ANGIOPLASTY WITH STENT PLACEMENT     x 3  . dental implants    . ESOPHAGOGASTRODUODENOSCOPY    . EYE SURGERY Bilateral   . EYE SURGERY     both eyes in 60's d/t muscle causing to squint  . HERNIA REPAIR     umbilical  . JOINT REPLACEMENT Right    knee  . KNEE ARTHROSCOPY    . MEDIAL PARTIAL KNEE REPLACEMENT Right   . MEDIASTERNOTOMY N/A 10/28/2015   Procedure: PARTIAL STERNOTOMY;  Surgeon: [name], MD;  Location: [place];  Service: Thoracic;  Laterality: N/A;  . numerous hand surgery    . 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   Procedure: RESECTION OF anterier MEDIASTINAL TUMOR;  Surgeon: [name], MD;  Location: [place];  Service: Thoracic;  Laterality: N/A;  . right hand fusion    . 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 TONSILLECTOMY      There were no vitals filed for this visit.      Subjective Assessment - 12/15/16 0936    Subjective  Patient had shot in hip relieving hip pain. Went to wedding over the weekend. still having occasional back pain.    Patient is accompained by: Family member   Pertinent History Pt is a pleasant 71 year old male who presents s/p L4-S1 laminectomy/fusion on 09/23/16 and was discharged yesterday from the hospital. His back pain started in April and he has had a complicated history since then involving multiple falls in May due to overmedication from conflicting medications. Prior to April he was independent with all tasks.  He is having difficulty adapting to home due to having a lot of area rugs that are different heights, running obstacle course from objects in house while in the home. The bed at home is higher then he is used to. Patient wants to return to gardening and is currently on blood thinner for 6 months and is a fall risk.    Limitations Sitting;Lifting;Standing;Walking;House hold activities;Other (comment)   How long  can you sit comfortably? hour   How long can you stand comfortably? 30 minutes   How long can you walk comfortably? 10 minutes   Diagnostic tests surgery   Patient Stated Goals returning to gardening, like to strengthen, feel more confident in ambulation, decrease fear of fall, balance, get back to driving   Currently in Pain? Yes   Pain Score 1    Pain Location Back   Pain Orientation Lower   Pain Descriptors / Indicators Aching   Pain Type Surgical pain   Pain Onset 1 to 4 weeks ago   Pain Frequency Intermittent    FABQ=43/96 AVG pain: 2/10 MODI=18%   Quantum leg press 75 lb 2x15; PT cueing for leg position and sequencing of movements for correct body mechanics.   Transfer box from plinth table to table to practice moving luggage with safe body mechanics. 5x, good body mechanics with occasional verbal cueing.   Seated hamstring stretch 60 seconds x 2   Stair negotiation practice in hallway, reciprocal gait pattern. No UE support  Sit to stand from plinth table 5lb bar UE raises 15x                             PT Education - 12/15/16 0937    Education provided Yes   Education Details body  mechanics and strengthening   Person(s) Educated Patient   Methods Explanation;Demonstration;Verbal cues   Comprehension Verbalized understanding;Returned demonstration          PT Short Term Goals - 12/15/16 0938      PT SHORT TERM GOAL #1   Title Patient will ambulate with cane for 100 ft to allow for more independent mobilization   Baseline 8/9 walk with walker 8/22: 30 ft with quad cane  9/10: 1310 ft no cane   Time 2   Period Weeks   Status Achieved     PT SHORT TERM GOAL #2   Title Patient will report a worst pain of 4/10 on VAS to improve tolerance with ADLs and reduced symptoms with activities.    Baseline 8/9: pain worst 8/10 8/22 worst pain 7/10 9/10: 3/10   Time 2   Period Weeks   Status Achieved     PT SHORT TERM GOAL #3   Title Patient  will increase 10 meter walk test to >1.41ms as to improve gait speed for better community ambulation and to reduce fall risk.   Baseline 8/9: 1.035m 9/10: 1.44m87mw/o cane   Time  2   Period Weeks   Status Achieved     PT SHORT TERM GOAL #4   Title Patient will walk without cane outside with no LOB to increase independent ambulation   Baseline ambulates outside without cane.    Time 2   Period Weeks   Status Achieved     PT SHORT TERM GOAL #5   Title Patient will return to gardening without LOB or back pain to return to previous level of activity   Baseline 9/10: unable to garden at this time; 10/16:  able to do some things of gardening, unable to perform full function   Time 6   Period Weeks   Status Partially Met           PT Long Term Goals - 12/15/16 7858      PT LONG TERM GOAL #1   Title Patient will be independent in home exercise program to improve strength/mobility for better functional independence with ADLs.   Baseline proficient with HEP   Time 6   Period Weeks   Status Partially Met     PT LONG TERM GOAL #2   Title Patient will decrease FABQ score to < 40/96 to demonstrate improved quality of life.    Baseline 8/9: 66/96 9/10: 55/96 10/16: 43/56   Time 6   Period Weeks   Status Partially Met     PT LONG TERM GOAL #3   Title Patient will reduce timed up and go to <11 seconds to reduce fall risk and demonstrate improved transfer/gait ability.   Baseline 8/9: 21 seconds 9/10: 9 seconds with no cane    Time 6   Period Weeks   Status Achieved     PT LONG TERM GOAL #4   Title Patient will report a worst pain of 3/10 on VAS in   to improve tolerance with ADLs and reduced symptoms with activities.    Baseline 8/9: worst 8-9/10 pain; 9/10 : worst pain 3/10   Time 6   Period Weeks   Status Achieved     PT LONG TERM GOAL #5   Title Patient will report average pain of 0/10 on VAS in  to improve tolerance with ADLs and reduced symptoms with activities.     Baseline avg pain 3/10; 10/16: 2/10   Time 6   Period Weeks   Status Partially Met     PT LONG TERM GOAL #6   Title Patient will score <10% on MODI to demonstrate improved quality of life and decreased back pain.    Baseline 10/16: 18%   Time 6   Period Weeks   Status New               Plan - 12/15/16 1257    Clinical Impression Statement Patient progressing towards goals at this time. FABQ=42/96, Average pain 2/10. MODI= 18%Still limited in gardening at this time due to difficulty bending over. Pain is reduced with current pain 1/10. Has incidences of no pain. MODI=18%. Educated on proper weight lifting/return to [employer] operational [employer]. Will attempt next week without therapy session due to being out of town.    Rehab Potential Good   Clinical Impairments Affecting Rehab Potential CAD (coronary artery disease) , Hyperlipidemia, mixed , Amnesia , Hepatitis B , GERD (gastroesophageal reflux disease) , Anxiety, unspecified , Depression, unspecified , Hx of cardiac catheterization , Right bundle branch block , Valvular heart disease,  Benign essential hypertension,  Thymus neoplasm , Seizure (CMS-HCC) ,Thrombocytopenia (CMS-HCC) , Spondylolisthesis  of lumbar region Asthma, unspecified OSA (obstructive sleep apnea) , Stroke (CMS-HCC), Ischemic embolic stroke (CMS-HCC) , Lymphedema , Meibomian gland dysfunction, Myopia, Presbyopia,  Spinal stenosis , Thymoma S/P lumbar laminectomy   PT Frequency 2x / week   PT Duration 6 weeks   PT Treatment/Interventions ADLs/Self Care Home Management;Cryotherapy;Electrical Stimulation;Iontophoresis 10m/ml Dexamethasone;Moist Heat;Ultrasound;Functional mobility training;Stair training;Gait training;DME Instruction;Therapeutic activities;Therapeutic exercise;Balance training;Neuromuscular re-education;Manual techniques;Patient/family education;Scar mobilization;Passive range of motion;Dry needling;Energy conservation;Visual/perceptual remediation/compensation   PT  Next Visit Plan recert   PT Home Exercise Plan see sheet   Consulted and Agree with Plan of Care Patient;Family member/caregiver   Family Member Consulted wife      Patient will benefit from skilled therapeutic intervention in order to improve the following deficits and impairments:  Abnormal gait, Decreased activity tolerance, Decreased balance, Decreased knowledge of use of DME, Decreased endurance, Decreased mobility, Decreased range of motion, Decreased safety awareness, Decreased strength, Decreased scar mobility, Difficulty walking, Dizziness, Impaired perceived functional ability, Improper body mechanics, Postural dysfunction, Pain  Visit Diagnosis: Acute midline low back pain, with sciatica presence unspecified  Difficulty in walking, not elsewhere classified  Muscle weakness (generalized)     Problem List Patient Active Problem List   Diagnosis Date Noted  . Lymphedema 12/16/2015  . Chronic venous insufficiency 12/16/2015  . Leg swelling 12/16/2015  . 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 Anxiety 05/23/2015  . Acid reflux 05/23/2015  . HBV (hepatitis B virus) infection 05/23/2015  . History of cardiac catheterization 05/23/2015  . Combined fat and carbohydrate induced hyperlipemia 05/23/2015  . Arteriosclerosis of coronary artery 05/23/2015  . Clinical depression 05/23/2015  . Cough 07/26/2014  . Benign essential HTN 07/16/2014  . Breathlessness on exertion 02/12/2014  . Ischemic embolic stroke (HRebecca 003/[id number]/9643 . Bundle branch block, right 05/29/2012  . History of knee surgery 05/26/2012   MJanna Arch PT, DPT   MJanna Arch10/16/2018, 12:58 PM  [place]MAIN [facility] HospitalSERVICES [address]RGibbon NAl[place] [address]Phone: [phone]  Fax:  [phone] Name: Johnny HUBBLEMRN: [id number] of Birth: [birth year]

## 2016-12-17 ENCOUNTER — Ambulatory Visit: Payer: Medicare Other

## 2016-12-31 ENCOUNTER — Ambulatory Visit: Payer: Medicare Other | Attending: Physical Medicine and Rehabilitation

## 2016-12-31 DIAGNOSIS — R262 Difficulty in walking, not elsewhere classified: Secondary | ICD-10-CM | POA: Insufficient documentation

## 2016-12-31 DIAGNOSIS — M25551 Pain in right hip: Secondary | ICD-10-CM | POA: Insufficient documentation

## 2016-12-31 DIAGNOSIS — M545 Low back pain: Secondary | ICD-10-CM | POA: Diagnosis present

## 2016-12-31 DIAGNOSIS — M6281 Muscle weakness (generalized): Secondary | ICD-10-CM | POA: Insufficient documentation

## 2016-12-31 NOTE — Patient Instructions (Signed)
Muscle MET: R hip anterior rotation correction R hip flexor stretch

## 2016-12-31 NOTE — Therapy (Signed)
[place] MAIN [facility] SERVICES [address] [place], [place], [address] Phone: [phone]   Fax:  [phone]  Physical Therapy Treatment  Patient Details  Name: Johnny Mccoy MRN: [id number] Date of Birth: [birth year] Referring Provider: Dr. Launa Grill  Encounter Date: 12/31/2016      PT End of Session - 12/31/16 2032    Visit Number 19   Number of Visits 40   Date for PT Re-Evaluation 02/25/17   Authorization Type next 1/10   Authorization - Visit Number 9   Authorization - Number of Visits 10   PT Start Time 1600   PT Stop Time 1645   PT Time Calculation (min) 45 min   Equipment Utilized During Treatment Gait belt   Activity Tolerance Patient tolerated treatment well;Patient limited by pain   Behavior During Therapy [facility] for tasks assessed/performed      Past Medical History:  Diagnosis Date  . Anxiety    takes Lorazepam daily  . Arthritis   . Asthma    Albuterol as needed.Breo daily as well as Flovent  . CAD (coronary artery disease)   . Cataract   . Cataracts, bilateral    immature  . Chronic back pain    compression  . Complication of anesthesia    seizures after partial knee in 2014.Surgery was in am and an still in post op until 9 that night. Was sent to [facility] for 5 days d/t uncontrollable shaking,incoherant  . Depressed    takes Pristiq daily  . Essential tremor   . GERD (gastroesophageal reflux disease)    takes Dexilant daily as well as Zantac  . 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 History of bronchitis    Jan-Mar 2017  . History of hiatal hernia   . Hyperlipidemia    takes Atorvastatin daily  . Hypertension    takes HCTZ,Amlodipine,and Metoprolol daily  . Insomnia    takes Trazodone nightly   . Joint pain   . Muscle spasm    in neck.Takes Flexeril as needed  . Numbness    right knee,right hand,and toes on left  . Seizures ([place])    takes Lamotrigine daily. Last seizure 6+ yrs ago    Past  Surgical History:  Procedure Laterality Date  . CARDIAC CATHETERIZATION     2015  . CARPAL TUNNEL RELEASE    . COLONOSCOPY    . CORONARY ANGIOPLASTY WITH STENT PLACEMENT     x 3  . dental implants    . ESOPHAGOGASTRODUODENOSCOPY    . EYE SURGERY Bilateral   . EYE SURGERY     both eyes in 60's d/t muscle causing to squint  . HERNIA REPAIR     umbilical  . JOINT REPLACEMENT Right    knee  . KNEE ARTHROSCOPY    . MEDIAL PARTIAL KNEE REPLACEMENT Right   . MEDIASTERNOTOMY N/A 10/28/2015   Procedure: PARTIAL STERNOTOMY;  Surgeon: [name], MD;  Location: [place];  Service: Thoracic;  Laterality: N/A;  . numerous hand surgery    . 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   Procedure: RESECTION OF anterier MEDIASTINAL TUMOR;  Surgeon: [name], MD;  Location: [place];  Service: Thoracic;  Laterality: N/A;  . right hand fusion    . 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 TONSILLECTOMY      There were no vitals filed for this visit.  Subjective Assessment - 12/31/16 1606    Subjective Patient was blinded by sun when in [place] and stepped up thinking there was a curb, however there wasn't one there and jerked back. Pain has been excruciating since. Localized to Right low back    Patient is accompained by: Family member   Pertinent History Pt is a pleasant 71 year old male who presents s/p L4-S1 laminectomy/fusion on 09/23/16 and was discharged yesterday from the hospital. His back pain started in April and he has had a complicated history since then involving multiple falls in May due to overmedication from conflicting medications. Prior to April he was independent with all tasks.  He is having difficulty adapting to home due to having a lot of area rugs that are different heights, running obstacle course from objects in house while in the home. The bed at home is higher then he is used to. Patient wants to return to gardening and is currently on blood  thinner for 6 months and is a fall risk.    Limitations Sitting;Lifting;Standing;Walking;House hold activities;Other (comment)   How long can you sit comfortably? hour   How long can you stand comfortably? 30 minutes   How long can you walk comfortably? 10 minutes   Diagnostic tests surgery   Patient Stated Goals returning to gardening, like to strengthen, feel more confident in ambulation, decrease fear of fall, balance, get back to driving   Currently in Pain? Yes   Pain Score 4    Pain Location Back   Pain Orientation Right;Lower   Pain Descriptors / Indicators Aching   Pain Type Acute pain   Pain Radiating Towards runs across back    Pain Onset 1 to 4 weeks ago   Pain Frequency Constant   Pain Relieving Factors on medication for pain now       FABQ=27/30, 42/66; 69/96 MODI=66%   apparent left 95.5 Right 94 true right 87 Left 88  Transfers: painful: require Min A   Straight leg raise test + on R   - tuning fork  R iliac crest higher than L in standing position   Distraction of L pelvix 8x20 seconds  Pelvic MET Hamstring activation R quad L with PVC pipe 10x 5 seconds , educated wife                          PT Education - 12/31/16 2030    Education provided Yes   Education Details new HEP for hips, if pain continues to get X ray, let surgeon know about accident immediate.    Person(s) Educated Patient   Methods Explanation;Demonstration;Verbal cues   Comprehension Verbalized understanding;Returned demonstration          PT Short Term Goals - 12/31/16 2040      PT SHORT TERM GOAL #1   Title Patient will ambulate with cane for 100 ft to allow for more independent mobilization   Baseline 8/9 walk with walker 8/22: 30 ft with quad cane  9/10: 1310 ft no cane   Time 2   Period Weeks   Status Achieved     PT SHORT TERM GOAL #2   Title Patient will report a worst pain of 4/10 on VAS to improve tolerance with ADLs and reduced symptoms with  activities.    Baseline 8/9: pain worst 8/10 8/22 worst pain 7/10 9/10: 3/10   Time 2   Period Weeks   Status Achieved     PT  SHORT TERM GOAL #3   Title Patient will increase 10 meter walk test to >1.66ms as to improve gait speed for better community ambulation and to reduce fall risk.   Baseline 8/9: 1.065m 9/10: 1.67m40mw/o cane   Time 2   Period Weeks   Status Achieved     PT SHORT TERM GOAL #4   Title Patient will walk without cane outside with no LOB to increase independent ambulation   Baseline ambulates outside without cane.    Time 2   Period Weeks   Status Achieved     PT SHORT TERM GOAL #5   Title Patient will return to gardening without LOB or back pain to return to previous level of activity   Baseline 9/10: unable to garden at this time; 10/16:  able to do some things of gardening, unable to perform full function   Time 6   Period Weeks   Status Partially Met           PT Long Term Goals - 12/31/16 2040      PT LONG TERM GOAL #1   Title Patient will be independent in home exercise program to improve strength/mobility for better functional independence with ADLs.   Baseline proficient with HEP   Time 6   Period Weeks   Status Partially Met     PT LONG TERM GOAL #2   Title Patient will decrease FABQ score to < 40/96 to demonstrate improved quality of life.    Baseline 8/9: 66/96 9/10: 55/96 10/16: 43/56 11/1: 69/96   Time 6   Period Weeks   Status Partially Met     PT LONG TERM GOAL #3   Title Patient will reduce timed up and go to <11 seconds to reduce fall risk and demonstrate improved transfer/gait ability.   Baseline 8/9: 21 seconds 9/10: 9 seconds with no cane    Time 6   Period Weeks   Status Achieved     PT LONG TERM GOAL #4   Title Patient will report a worst pain of 3/10 on VAS in   to improve tolerance with ADLs and reduced symptoms with activities.    Baseline 8/9: worst 8-9/10 pain; 9/10 : worst pain 3/10 11/1: worst pain: 9/10   Time 6    Period Weeks   Status Partially Met     PT LONG TERM GOAL #5   Title Patient will report average pain of 0/10 on VAS in  to improve tolerance with ADLs and reduced symptoms with activities.    Baseline avg pain 3/10; 10/16: 2/10 ; 11/1: average pain 5/10    Time 6   Period Weeks   Status Partially Met     PT LONG TERM GOAL #6   Title Patient will score <10% on MODI to demonstrate improved quality of life and decreased back pain.    Baseline 10/16: 18% 11/1: 66%   Time 6   Period Weeks   Status On-going               Plan - 12/31/16 2039    Clinical Impression Statement Patient regressed over vacation due to near fall where patient misstepped and has had severe R hip/back pain in pelvis region. Pain shoots across low back. Tuning fork -, patient advised to inform surgeon to assess if imaging required. Noticeable leg length discrepancy with anterior and superior rotation (apparent= R 94 L 95.5, True=R 87 L 88). Pelvic METs decreased pain initially and then caused spasming  of pelvic musculature due to tight paraspinal and hip musculature. MODI=66%, FABQ=69/96. Patient would benefit from skilled physical therapy to decrease low back/hip pain and return to prior level of function.    Rehab Potential Good   Clinical Impairments Affecting Rehab Potential CAD (coronary artery disease) , Hyperlipidemia, mixed , Amnesia , Hepatitis B , GERD (gastroesophageal reflux disease) , Anxiety, unspecified , Depression, unspecified , Hx of cardiac catheterization , Right bundle branch block , Valvular heart disease,  Benign essential hypertension,  Thymus neoplasm , Seizure (CMS-HCC) ,Thrombocytopenia (CMS-HCC) , Spondylolisthesis of lumbar region Asthma, unspecified OSA (obstructive sleep apnea) , Stroke (CMS-HCC), Ischemic embolic stroke (CMS-HCC) , Lymphedema , Meibomian gland dysfunction, Myopia, Presbyopia,  Spinal stenosis , Thymoma S/P lumbar laminectomy   PT Frequency 2x / week   PT Duration 6  weeks   PT Treatment/Interventions ADLs/Self Care Home Management;Cryotherapy;Electrical Stimulation;Iontophoresis 72m/ml Dexamethasone;Moist Heat;Ultrasound;Functional mobility training;Stair training;Gait training;DME Instruction;Therapeutic activities;Therapeutic exercise;Balance training;Neuromuscular re-education;Manual techniques;Patient/family education;Scar mobilization;Passive range of motion;Dry needling;Energy conservation;Visual/perceptual remediation/compensation   PT Next Visit Plan R hip rotation, distraction, mobilization   PT Home Exercise Plan see sheet   Consulted and Agree with Plan of Care Patient;Family member/caregiver   Family Member Consulted wife      Patient will benefit from skilled therapeutic intervention in order to improve the following deficits and impairments:  Abnormal gait, Decreased activity tolerance, Decreased balance, Decreased knowledge of use of DME, Decreased endurance, Decreased mobility, Decreased range of motion, Decreased safety awareness, Decreased strength, Decreased scar mobility, Difficulty walking, Dizziness, Impaired perceived functional ability, Improper body mechanics, Postural dysfunction, Pain  Visit Diagnosis: Pain in right hip  Difficulty in walking, not elsewhere classified  Muscle weakness (generalized)  Low back pain, unspecified back pain laterality, unspecified chronicity, with sciatica presence unspecified       G-Codes - [id number]   Functional Assessment Tool Used (Outpatient Only) FABQ, MODI, Leg length, VAS, clinical judgement   Functional Limitation Mobility: Walking and moving around   Mobility: Walking and Moving Around Current Status ([phone] At least 40 percent but less than 60 percent impaired, limited or restricted   Mobility: Walking and Moving Around Goal Status ([phone] At least 1 percent but less than 20 percent impaired, limited or restricted      Problem List Patient Active Problem List   Diagnosis  Date Noted  . Lymphedema 12/16/2015  . Chronic venous insufficiency 12/16/2015  . Leg swelling 12/16/2015  . 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 Anxiety 05/23/2015  . Acid reflux 05/23/2015  . HBV (hepatitis B virus) infection 05/23/2015  . History of cardiac catheterization 05/23/2015  . Combined fat and carbohydrate induced hyperlipemia 05/23/2015  . Arteriosclerosis of coronary artery 05/23/2015  . Clinical depression 05/23/2015  . Cough 07/26/2014  . Benign essential HTN 07/16/2014  . Breathlessness on exertion 02/12/2014  . Ischemic embolic stroke [place] 050/38/8828 . Bundle branch block, right 05/29/2012  . History of knee surgery 05/26/2012   MJanna Arch PT, DPT   MJanna Arch111/15/2018 8:43 PM  [place] [facility] [address] [address]Phone: [phone]  Fax:  [phone] Name: JSTYLIANOS STRADLINGMRN: [id number] of Birth: [birth year]

## 2017-01-11 ENCOUNTER — Ambulatory Visit: Payer: Medicare Other

## 2017-01-11 DIAGNOSIS — R262 Difficulty in walking, not elsewhere classified: Secondary | ICD-10-CM

## 2017-01-11 DIAGNOSIS — M25551 Pain in right hip: Secondary | ICD-10-CM | POA: Diagnosis not present

## 2017-01-11 DIAGNOSIS — M545 Low back pain: Secondary | ICD-10-CM

## 2017-01-11 DIAGNOSIS — M6281 Muscle weakness (generalized): Secondary | ICD-10-CM

## 2017-01-11 NOTE — Therapy (Signed)
[place] MAIN [facility] SERVICES [address] [place], [place], [address] Phone: [phone]   Fax:  [phone]  Physical Therapy Treatment  Patient Details  Name: Johnny Mccoy MRN: [id number] Date of Birth: 10-23-1945 Referring Provider: Dr. Launa Grill   Encounter Date: 01/11/2017  PT End of Session - 01/12/17 0947    Visit Number  20    Number of Visits  40    Date for PT Re-Evaluation  02/25/17    Authorization - Visit Number  1    Authorization - Number of Visits  10    PT Start Time  9381    PT Stop Time  0175    PT Time Calculation (min)  30 min    Equipment Utilized During Treatment  Gait belt    Activity Tolerance  Patient tolerated treatment well;Patient limited by pain    Behavior During Therapy  [facility] for tasks assessed/performed       Past Medical History:  Diagnosis Date  . Anxiety    takes Lorazepam daily  . Arthritis   . Asthma    Albuterol as needed.Breo daily as well as Flovent  . CAD (coronary artery disease)   . Cataract   . Cataracts, bilateral    immature  . Chronic back pain    compression  . Complication of anesthesia    seizures after partial knee in 2014.Surgery was in am and an still in post op until 9 that night. Was sent to [facility] for 5 days d/t uncontrollable shaking,incoherant  . Depressed    takes Pristiq daily  . Essential tremor   . GERD (gastroesophageal reflux disease)    takes Dexilant daily as well as Zantac  . 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 History of bronchitis    Jan-Mar 2017  . History of hiatal hernia   . Hyperlipidemia    takes Atorvastatin daily  . Hypertension    takes HCTZ,Amlodipine,and Metoprolol daily  . Insomnia    takes Trazodone nightly   . Joint pain   . Muscle spasm    in neck.Takes Flexeril as needed  . Numbness    right knee,right hand,and toes on left  . Seizures ([place])    takes Lamotrigine daily. Last seizure 6+ yrs ago    Past Surgical History:   Procedure Laterality Date  . CARDIAC CATHETERIZATION     2015  . CARPAL TUNNEL RELEASE    . COLONOSCOPY    . CORONARY ANGIOPLASTY WITH STENT PLACEMENT     x 3  . dental implants    . ESOPHAGOGASTRODUODENOSCOPY    . EYE SURGERY Bilateral   . EYE SURGERY     both eyes in 60's d/t muscle causing to squint  . HERNIA REPAIR     umbilical  . JOINT REPLACEMENT Right    knee  . KNEE ARTHROSCOPY    . MEDIAL PARTIAL KNEE REPLACEMENT Right   . numerous hand surgery    . right hand fusion    . 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 TONSILLECTOMY      There were no vitals filed for this visit.  Subjective Assessment - 01/11/17 1651    Subjective  Patient was at emergency room all night due to wife falling and hitting her head last night. Has been having bad hip and low back pain.     Patient is accompained by:  Family member  Pertinent History  Pt is a pleasant 71 year old male who presents s/p L4-S1 laminectomy/fusion on 09/23/16 and was discharged yesterday from the hospital. His back pain started in April and he has had a complicated history since then involving multiple falls in May due to overmedication from conflicting medications. Prior to April he was independent with all tasks.  He is having difficulty adapting to home due to having a lot of area rugs that are different heights, running obstacle course from objects in house while in the home. The bed at home is higher then he is used to. Patient wants to return to gardening and is currently on blood thinner for 6 months and is a fall risk.     Limitations  Sitting;Lifting;Standing;Walking;House hold activities;Other (comment)    How long can you sit comfortably?  hour    How long can you stand comfortably?  30 minutes    How long can you walk comfortably?  10 minutes    Diagnostic tests  surgery    Patient Stated Goals  returning to gardening, like to strengthen, feel more confident in ambulation, decrease fear of  fall, balance, get back to driving    Currently in Pain?  Yes    Pain Score  6     Pain Location  Back    Pain Orientation  Lower    Pain Descriptors / Indicators  Aching;Tightness;Sharp    Pain Type  Acute pain    Pain Onset  1 to 4 weeks ago    Pain Frequency  Constant      Manual SAD: with belt: inferior 6x 20 seconds LAD: with belt : inferior 6x 20 seconds  Thoracic rotation 15 x  TherEx Posterior pelvic tilt induces spasm Pelvic MET Hamstring activation R quad L with PVC pipe 10x 5 seconds      Ice placed on back due to severe spasming upon transfer from supine to sit.   Patient advised to get imaging  Due to second severe spasming.                   PT Education - 01/12/17 0945    Education provided  Yes    Education Details  call doctor about recent increase in pain    Person(s) Educated  Patient    Methods  Explanation;Demonstration    Comprehension  Returned demonstration;Verbalized understanding       PT Short Term Goals - 12/31/16 2040      PT SHORT TERM GOAL #1   Title  Patient will ambulate with cane for 100 ft to allow for more independent mobilization    Baseline  8/9 walk with walker 8/22: 30 ft with quad cane  9/10: 1310 ft no cane    Time  2    Period  Weeks    Status  Achieved      PT SHORT TERM GOAL #2   Title  Patient will report a worst pain of 4/10 on VAS to improve tolerance with ADLs and reduced symptoms with activities.     Baseline  8/9: pain worst 8/10 8/22 worst pain 7/10 9/10: 3/10    Time  2    Period  Weeks    Status  Achieved      PT SHORT TERM GOAL #3   Title  Patient will increase 10 meter walk test to >1.26ms as to improve gait speed for better community ambulation and to reduce fall risk.    Baseline  8/9:  1.80ms 9/10: 1.466m w/o cane    Time  2    Period  Weeks    Status  Achieved      PT SHORT TERM GOAL #4   Title  Patient will walk without cane outside with no LOB to increase independent ambulation     Baseline  ambulates outside without cane.     Time  2    Period  Weeks    Status  Achieved      PT SHORT TERM GOAL #5   Title  Patient will return to gardening without LOB or back pain to return to previous level of activity    Baseline  9/10: unable to garden at this time; 10/16:  able to do some things of gardening, unable to perform full function    Time  6    Period  Weeks    Status  Partially Met        PT Long Term Goals - 12/31/16 2040      PT LONG TERM GOAL #1   Title  Patient will be independent in home exercise program to improve strength/mobility for better functional independence with ADLs.    Baseline  proficient with HEP    Time  6    Period  Weeks    Status  Partially Met      PT LONG TERM GOAL #2   Title  Patient will decrease FABQ score to < 40/96 to demonstrate improved quality of life.     Baseline  8/9: 66/96 9/10: 55/96 10/16: 43/56 11/1: 69/96    Time  6    Period  Weeks    Status  Partially Met      PT LONG TERM GOAL #3   Title  Patient will reduce timed up and go to <11 seconds to reduce fall risk and demonstrate improved transfer/gait ability.    Baseline  8/9: 21 seconds 9/10: 9 seconds with no cane     Time  6    Period  Weeks    Status  Achieved      PT LONG TERM GOAL #4   Title  Patient will report a worst pain of 3/10 on VAS in   to improve tolerance with ADLs and reduced symptoms with activities.     Baseline  8/9: worst 8-9/10 pain; 9/10 : worst pain 3/10 11/1: worst pain: 9/10    Time  6    Period  Weeks    Status  Partially Met      PT LONG TERM GOAL #5   Title  Patient will report average pain of 0/10 on VAS in  to improve tolerance with ADLs and reduced symptoms with activities.     Baseline  avg pain 3/10; 10/16: 2/10 ; 11/1: average pain 5/10     Time  6    Period  Weeks    Status  Partially Met      PT LONG TERM GOAL #6   Title  Patient will score <10% on MODI to demonstrate improved quality of life and decreased back  pain.     Baseline  10/16: 18% 11/1: 66%    Time  6    Period  Weeks    Status  On-going            Plan - 01/11/17 1716    Clinical Impression Statement  Posterior pelvic tilt induces spasm. Distraction decreases back pain . Severe back pain and spasming induced when performing supine  to sit ending in termination of session.  This is the second severe spasming according to patient within 24 hours so patient advised to call doctor for imaging. Patient called friend for medication and to pick him up as he is not able to drive.     Rehab Potential  Good    Clinical Impairments Affecting Rehab Potential  CAD (coronary artery disease) , Hyperlipidemia, mixed , Amnesia , Hepatitis B , GERD (gastroesophageal reflux disease) , Anxiety, unspecified , Depression, unspecified , Hx of cardiac catheterization , Right bundle branch block , Valvular heart disease,  Benign essential hypertension,  Thymus neoplasm , Seizure (CMS-HCC) ,Thrombocytopenia (CMS-HCC) , Spondylolisthesis of lumbar region Asthma, unspecified OSA (obstructive sleep apnea) , Stroke (CMS-HCC), Ischemic embolic stroke (CMS-HCC) , Lymphedema , Meibomian gland dysfunction, Myopia, Presbyopia,  Spinal stenosis , Thymoma S/P lumbar laminectomy    PT Frequency  2x / week    PT Duration  6 weeks    PT Treatment/Interventions  ADLs/Self Care Home Management;Cryotherapy;Electrical Stimulation;Iontophoresis 1m/ml Dexamethasone;Moist Heat;Ultrasound;Functional mobility training;Stair training;Gait training;DME Instruction;Therapeutic activities;Therapeutic exercise;Balance training;Neuromuscular re-education;Manual techniques;Patient/family education;Scar mobilization;Passive range of motion;Dry needling;Energy conservation;Visual/perceptual remediation/compensation    PT Next Visit Plan  R hip rotation, distraction, mobilization    PT Home Exercise Plan  see sheet    Consulted and Agree with Plan of Care  Patient;Family member/caregiver     Family Member Consulted  wife       Patient will benefit from skilled therapeutic intervention in order to improve the following deficits and impairments:  Abnormal gait, Decreased activity tolerance, Decreased balance, Decreased knowledge of use of DME, Decreased endurance, Decreased mobility, Decreased range of motion, Decreased safety awareness, Decreased strength, Decreased scar mobility, Difficulty walking, Dizziness, Impaired perceived functional ability, Improper body mechanics, Postural dysfunction, Pain  Visit Diagnosis: Pain in right hip  Difficulty in walking, not elsewhere classified  Muscle weakness (generalized)  Low back pain, unspecified back pain laterality, unspecified chronicity, with sciatica presence unspecified     Problem List Patient Active Problem List   Diagnosis Date Noted  . Lymphedema 12/16/2015  . Chronic venous insufficiency 12/16/2015  . Leg swelling 12/16/2015  . 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 Anxiety 05/23/2015  . Acid reflux 05/23/2015  . HBV (hepatitis B virus) infection 05/23/2015  . History of cardiac catheterization 05/23/2015  . Combined fat and carbohydrate induced hyperlipemia 05/23/2015  . Arteriosclerosis of coronary artery 05/23/2015  . Clinical depression 05/23/2015  . Cough 07/26/2014  . Benign essential HTN 07/16/2014  . Breathlessness on exertion 02/12/2014  . Ischemic embolic stroke ([place] 082/64/1583 . Bundle branch block, right 05/29/2012  . History of knee surgery 05/26/2012   MJanna Arch PT, DPT   MJanna Arch11/13/2018, 9:48 AM  [place]MAIN [facility]ERV[facility] [address] [address]Phone: [phone]  Fax:  [phone] Name: Johnny WALTHALLMRN: [id number] of Birth: [birth year]

## 2017-01-20 ENCOUNTER — Ambulatory Visit: Payer: Medicare Other

## 2017-01-25 ENCOUNTER — Ambulatory Visit: Payer: Medicare Other

## 2017-06-29 DIAGNOSIS — I350 Nonrheumatic aortic (valve) stenosis: Secondary | ICD-10-CM | POA: Insufficient documentation

## 2017-07-21 DIAGNOSIS — H532 Diplopia: Secondary | ICD-10-CM | POA: Insufficient documentation

## 2017-07-21 DIAGNOSIS — H2513 Age-related nuclear cataract, bilateral: Secondary | ICD-10-CM | POA: Insufficient documentation

## 2017-07-21 DIAGNOSIS — H52203 Unspecified astigmatism, bilateral: Secondary | ICD-10-CM | POA: Insufficient documentation

## 2017-07-21 DIAGNOSIS — H5213 Myopia, bilateral: Secondary | ICD-10-CM | POA: Insufficient documentation

## 2017-09-20 ENCOUNTER — Ambulatory Visit
Admission: RE | Admit: 2017-09-20 | Discharge: 2017-09-20 | Disposition: A | Discharge status: Home / Self Care | Payer: Medicare Other | Source: Ambulatory Visit | Attending: Neurological Surgery | Admitting: Neurological Surgery

## 2017-09-20 ENCOUNTER — Other Ambulatory Visit: Payer: Self-pay | Admitting: Neurological Surgery

## 2017-09-20 DIAGNOSIS — M533 Sacrococcygeal disorders, not elsewhere classified: Secondary | ICD-10-CM

## 2017-09-20 DIAGNOSIS — G8929 Other chronic pain: Secondary | ICD-10-CM

## 2017-09-20 DIAGNOSIS — M545 Low back pain: Secondary | ICD-10-CM

## 2018-02-25 ENCOUNTER — Other Ambulatory Visit: Payer: Self-pay | Admitting: Family Medicine

## 2018-02-25 ENCOUNTER — Ambulatory Visit
Admission: RE | Admit: 2018-02-25 | Discharge: 2018-02-25 | Disposition: A | Discharge status: Home / Self Care | Payer: Medicare Other | Source: Ambulatory Visit | Attending: Family Medicine | Admitting: Family Medicine

## 2018-02-25 DIAGNOSIS — R1013 Epigastric pain: Secondary | ICD-10-CM | POA: Insufficient documentation

## 2018-02-25 DIAGNOSIS — K439 Ventral hernia without obstruction or gangrene: Secondary | ICD-10-CM | POA: Diagnosis present

## 2018-02-25 MED ORDER — IOPAMIDOL (ISOVUE-300) INJECTION 61%
100.0000 mL | Freq: Once | INTRAVENOUS | Status: AC | PRN
  Administered 2018-02-25: 100 mL via INTRAVENOUS

## 2018-03-18 ENCOUNTER — Ambulatory Visit (INDEPENDENT_AMBULATORY_CARE_PROVIDER_SITE_OTHER): Payer: Medicare Other | Admitting: Plastic Surgery

## 2018-03-18 ENCOUNTER — Encounter: Payer: Self-pay | Admitting: Plastic Surgery

## 2018-03-18 VITALS — BP 137/76 | HR 68 | Temp 98.8°F | Ht 67.0 in | Wt 238.5 lb

## 2018-03-18 DIAGNOSIS — I872 Venous insufficiency (chronic) (peripheral): Secondary | ICD-10-CM

## 2018-03-18 DIAGNOSIS — M6208 Separation of muscle (nontraumatic), other site: Secondary | ICD-10-CM | POA: Diagnosis not present

## 2018-03-18 NOTE — Progress Notes (Signed)
Patient ID: Johnny Mccoy, male    DOB: November 26, 1945, 73 y.o.   MRN: [id number]   Chief Complaint  Patient presents with  . Advice Only    for Diastasis recti    The patient is a 73 yrs old wm here with his wife for evaluation of his abdomen.  He has a rectus diastases that is 12 cm in width.  He states that it has been like that for couple years and getting worse.  Feels pressure especially after eating making it very uncomfortable.  He finds it difficult to fit into close due to the pressure.  He has gastroesophageal reflux, coronary artery disease, lymphedema and a history of a thymoma and stroke.  He had an umbilical hernia that was repaired in the past thymoma was resected by Dr. Servando Snare.  I don't see a hernia at this time.  He is 5 feet and 238 pounds.  He has recently signed up for a exercise trainer is aware he needs some weight.  He enjoys a bourbon or wine nightly.  He has had 2 spine surgeries and wrist surgery as well.   Review of Systems  Constitutional: Negative.  Negative for activity change and appetite change.  HENT: Negative.   Eyes: Negative.  Negative for photophobia.  Respiratory: Negative.   Cardiovascular: Negative.   Gastrointestinal: Positive for abdominal distention and abdominal pain. Negative for constipation and vomiting.  Endocrine: Negative.   Genitourinary: Negative.   Musculoskeletal: Negative.   Skin: Negative for color change and wound.    Past Medical History:  Diagnosis Date  . Anxiety    takes Lorazepam daily  . Arthritis   . Asthma    Albuterol as needed.Breo daily as well as Flovent  . CAD (coronary artery disease)   . Cataract   . Cataracts, bilateral    immature  . Chronic back pain    compression  . Complication of anesthesia    seizures after partial knee in 2014.Surgery was in am and an still in post op until 9 that night. Was sent to [facility] for 5 days d/t uncontrollable shaking,incoherant  . Depressed    takes Pristiq daily  .  Essential tremor   . GERD (gastroesophageal reflux disease)    takes Dexilant daily as well as Zantac  . 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 History of bronchitis    Jan-Mar 2017  . History of hiatal hernia   . Hyperlipidemia    takes Atorvastatin daily  . Hypertension    takes HCTZ,Amlodipine,and Metoprolol daily  . Insomnia    takes Trazodone nightly   . Joint pain   . Muscle spasm    in neck.Takes Flexeril as needed  . Numbness    right knee,right hand,and toes on left  . Seizures ([place])    takes Lamotrigine daily. Last seizure 6+ yrs ago    Past Surgical History:  Procedure Laterality Date  . CARDIAC CATHETERIZATION     2015  . CARPAL TUNNEL RELEASE    . COLONOSCOPY    . CORONARY ANGIOPLASTY WITH STENT PLACEMENT     x 3  . dental implants    . ESOPHAGOGASTRODUODENOSCOPY    . EYE SURGERY Bilateral   . EYE SURGERY     both eyes in 60's d/t muscle causing to squint  . HERNIA REPAIR     umbilical  . JOINT REPLACEMENT Right    knee  . KNEE ARTHROSCOPY    .  MEDIAL PARTIAL KNEE REPLACEMENT Right   . MEDIASTERNOTOMY N/A 10/28/2015   Procedure: PARTIAL STERNOTOMY;  Surgeon: [name], MD;  Location: [facility];  Service: Thoracic;  Laterality: N/A;  . numerous hand surgery    . 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   Procedure: RESECTION OF anterier MEDIASTINAL TUMOR;  Surgeon: [name], MD;  Location: [place];  Service: Thoracic;  Laterality: N/A;  . right hand fusion    . 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 TONSILLECTOMY        Current Outpatient Medications:  .  aspirin EC 81 MG tablet, Take 81 mg by mouth daily. , Disp: , Rfl:  .  BREO ELLIPTA 100-25 MCG/INH AEPB, Inhale 1 puff into the lungs daily. , Disp: , Rfl:  .  dexlansoprazole (DEXILANT) 60 MG capsule, Take 60 mg by mouth daily. , Disp: , Rfl:  .  diclofenac sodium (VOLTAREN) 1 % GEL, Apply topically., Disp: , Rfl:  .  gemfibrozil (LOPID) 600 MG tablet, Take 1  tablet by mouth 2 (two) times daily., Disp: , Rfl:  .  LORazepam (ATIVAN) 0.5 MG tablet, Take 0.5 mg by mouth 2 (two) times daily. , Disp: , Rfl:  .  metoprolol succinate (TOPROL-XL) 50 MG 24 hr tablet, TAKE 1 TABLET (50 MG TOTAL) BY MOUTH ONCE DAILY., Disp: , Rfl: 3 .  NAMENDA XR 28 MG CP24 24 hr capsule, Take 28 mg by mouth daily. , Disp: , Rfl:  .  nitroGLYCERIN (NITROLINGUAL) 0.4 MG/SPRAY spray, Place 2 sprays under the tongue every 5 (five) minutes x 3 doses as needed. , Disp: , Rfl:  .  olmesartan (BENICAR) 40 MG tablet, Take 1 tablet by mouth daily., Disp: , Rfl:  .  PRISTIQ 100 MG 24 hr tablet, Take 100 mg by mouth daily. , Disp: , Rfl:  .  tadalafil (ADCIRCA/CIALIS) 20 MG tablet, Take by mouth., Disp: , Rfl:  .  tiZANidine (ZANAFLEX) 4 MG tablet, Take 1 tablet by mouth 3 (three) times daily., Disp: , Rfl:  .  traZODone (DESYREL) 100 MG tablet, Take 100 mg by mouth at bedtime as needed. , Disp: , Rfl:  .  valACYclovir (VALTREX) 1000 MG tablet, Take 1 tablet by mouth as needed., Disp: , Rfl:  .  atorvastatin (LIPITOR) 80 MG tablet, Take 80 mg by mouth daily at 6 PM. , Disp: , Rfl:    Objective:   Vitals:   03/18/18 1404  BP: 137/76  Pulse: 68  Temp: 98.8 F (37.1 C)  SpO2: 98%    Physical Exam Vitals signs and nursing note reviewed.  Constitutional:      Appearance: Normal appearance.  HENT:     Head: Normocephalic and atraumatic.     Left Ear: External ear normal.     Mouth/Throat:     Mouth: Mucous membranes are moist.  Cardiovascular:     Rate and Rhythm: Normal rate.  Pulmonary:     Effort: Pulmonary effort is normal.  Abdominal:     General: There is distension.     Palpations: There is no mass.     Tenderness: There is abdominal tenderness. There is no guarding or rebound.     Hernia: No hernia is present.  Musculoskeletal:        General: No deformity.  Skin:    General: Skin is warm.  Neurological:     General: No focal deficit present.     Mental  Status: He is  alert.  Psychiatric:        Mood and Affect: Mood normal.        Thought Content: Thought content normal.        Judgment: Judgment normal.     Assessment & Plan:  Chronic venous insufficiency  Diastasis of rectus abdominis We had a lengthy discussion regarding the surgery and how he needs to loss weight prior to any surgery.   Diastases repair but needs to be a minimum of 20 pounds lighter in order to reduce his risk and improve his outcome.  I have referred him to the healthy [facility] wellness center.  We also discussed some healthy eating habits.  I recommended no alcohol or minimizing it compared to what he is doing now.  [place], DO

## 2018-06-23 ENCOUNTER — Other Ambulatory Visit: Payer: Self-pay | Admitting: Internal Medicine

## 2018-06-23 DIAGNOSIS — J690 Pneumonitis due to inhalation of food and vomit: Secondary | ICD-10-CM

## 2018-06-28 ENCOUNTER — Other Ambulatory Visit: Payer: Self-pay

## 2018-06-28 ENCOUNTER — Ambulatory Visit
Admission: RE | Admit: 2018-06-28 | Discharge: 2018-06-28 | Disposition: A | Discharge status: Home / Self Care | Payer: Medicare Other | Source: Ambulatory Visit | Attending: Internal Medicine | Admitting: Internal Medicine

## 2018-06-28 DIAGNOSIS — J690 Pneumonitis due to inhalation of food and vomit: Secondary | ICD-10-CM | POA: Diagnosis present

## 2018-07-07 ENCOUNTER — Other Ambulatory Visit: Payer: Self-pay | Admitting: Pulmonary Disease

## 2018-07-07 DIAGNOSIS — J4540 Moderate persistent asthma, uncomplicated: Secondary | ICD-10-CM

## 2018-07-07 DIAGNOSIS — R0602 Shortness of breath: Secondary | ICD-10-CM

## 2018-09-16 ENCOUNTER — Ambulatory Visit: Payer: Medicare Other | Admitting: Plastic Surgery

## 2018-10-03 ENCOUNTER — Ambulatory Visit: Admission: RE | Admit: 2018-10-03 | Payer: Medicare Other | Source: Ambulatory Visit

## 2018-10-10 ENCOUNTER — Ambulatory Visit
Admission: RE | Admit: 2018-10-10 | Discharge: 2018-10-10 | Disposition: A | Discharge status: Home / Self Care | Payer: Medicare Other | Source: Ambulatory Visit | Attending: Pulmonary Disease | Admitting: Pulmonary Disease

## 2018-10-10 ENCOUNTER — Other Ambulatory Visit: Payer: Self-pay

## 2018-10-10 DIAGNOSIS — R0602 Shortness of breath: Secondary | ICD-10-CM | POA: Diagnosis not present

## 2018-10-10 DIAGNOSIS — J4540 Moderate persistent asthma, uncomplicated: Secondary | ICD-10-CM | POA: Insufficient documentation

## 2018-10-10 LAB — POCT I-STAT CREATININE: Creatinine, Ser: 0.9 mg/dL (ref 0.61–1.24)

## 2018-10-10 MED ORDER — IOHEXOL 300 MG/ML  SOLN
75.0000 mL | Freq: Once | INTRAMUSCULAR | Status: AC | PRN
  Administered 2018-10-10: 10:00:00 75 mL via INTRAVENOUS

## 2018-10-12 ENCOUNTER — Ambulatory Visit: Payer: Medicare Other

## 2018-10-21 DIAGNOSIS — H8190 Unspecified disorder of vestibular function, unspecified ear: Secondary | ICD-10-CM | POA: Insufficient documentation

## 2018-11-02 DIAGNOSIS — G609 Hereditary and idiopathic neuropathy, unspecified: Secondary | ICD-10-CM | POA: Insufficient documentation

## 2018-12-30 ENCOUNTER — Other Ambulatory Visit: Payer: Self-pay

## 2018-12-30 ENCOUNTER — Ambulatory Visit: Payer: Medicare Other | Attending: Psychology

## 2018-12-30 VITALS — BP 147/80 | HR 57

## 2018-12-30 DIAGNOSIS — R42 Dizziness and giddiness: Secondary | ICD-10-CM | POA: Diagnosis present

## 2018-12-30 DIAGNOSIS — R2681 Unsteadiness on feet: Secondary | ICD-10-CM | POA: Diagnosis present

## 2018-12-30 NOTE — Therapy (Signed)
[place] MAIN [facility] SERVICES [address] [place], [place], [address] Phone: [phone]   Fax:  [phone]  Physical Therapy Evaluation  Patient Details  Name: ANTAR DOLLMAN MRN: [id number] Date of Birth: Jun 23, 1969 Referring Provider (PT): Dr. Christian Mate   Encounter Date: 12/30/2018  PT End of Session - 12/30/18 1213    Visit Number  1    Number of Visits  9    Date for PT Re-Evaluation  02/24/19    Authorization Type  eval 12/30/18    PT Start Time  1035    PT Stop Time  1135    PT Time Calculation (min)  60 min    Equipment Utilized During Treatment  Gait belt    Activity Tolerance  Patient tolerated treatment well    Behavior During Therapy  [facility] for tasks assessed/performed       Past Medical History:  Diagnosis Date  . Anxiety    takes Lorazepam daily  . Arthritis   . Asthma    Albuterol as needed.Breo daily as well as Flovent  . CAD (coronary artery disease)   . Cataract   . Cataracts, bilateral    immature  . Chronic back pain    compression  . Complication of anesthesia    seizures after partial knee in 2014.Surgery was in am and an still in post op until 9 that night. Was sent to [facility] for 5 days d/t uncontrollable shaking,incoherant  . Depressed    takes Pristiq daily  . Essential tremor   . GERD (gastroesophageal reflux disease)    takes Dexilant daily as well as Zantac  . 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 History of bronchitis    Jan-Mar 2017  . History of hiatal hernia   . Hyperlipidemia    takes Atorvastatin daily  . Hypertension    takes HCTZ,Amlodipine,and Metoprolol daily  . Insomnia    takes Trazodone nightly   . Joint pain   . Muscle spasm    in neck.Takes Flexeril as needed  . Numbness    right knee,right hand,and toes on left  . Seizures ([place])    takes Lamotrigine daily. Last seizure 6+ yrs ago    Past Surgical History:  Procedure Laterality Date  . CARDIAC CATHETERIZATION      2015  . CARPAL TUNNEL RELEASE    . COLONOSCOPY    . CORONARY ANGIOPLASTY WITH STENT PLACEMENT     x 3  . dental implants    . ESOPHAGOGASTRODUODENOSCOPY    . EYE SURGERY Bilateral   . EYE SURGERY     both eyes in 60's d/t muscle causing to squint  . HERNIA REPAIR     umbilical  . JOINT REPLACEMENT Right    knee  . KNEE ARTHROSCOPY    . MEDIAL PARTIAL KNEE REPLACEMENT Right   . MEDIASTERNOTOMY N/A 10/28/2015   Procedure: PARTIAL STERNOTOMY;  Surgeon: [name], MD;  Location: [facility];  Service: Thoracic;  Laterality: N/A;  . numerous hand surgery    . 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   Procedure: RESECTION OF anterier MEDIASTINAL TUMOR;  Surgeon: [name], MD;  Location: [facility];  Service: Thoracic;  Laterality: N/A;  . right hand fusion    . 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 TONSILLECTOMY      Vitals:   12/30/18 1045  BP: (!) 147/80  Pulse: (!) 57  SpO2: 98%     Subjective Assessment - 12/30/18 1208    Subjective  Pt presents for evaluation of L vestibular hypofunction    Pertinent History  Pt reports that about 3 months ago, after having COVID-19, he began having white flashes in his eyes, experiencing intermittent dizziness, and headaches.  He reports when it first started, he would feel it when he was transitioning from sitting to standing and would begin veering to the L.  He presents with baseline diplopia and wears prism lens. VNG study was negative with the exception of 22% left vestibular weakness    Limitations  Walking;Other (comment)   rising/positional changes   Patient Stated Goals  decrease dizziness episodes    Currently in Pain?  No/denies         [facility] PT Assessment - 12/30/18 1022      Assessment   Medical Diagnosis  L vestibular hypofunction    Referring Provider (PT)  Dr. Christian Mate    Onset Date/Surgical Date  08/31/18    Hand Dominance  Left    Next MD Visit  03/03/2019    Prior Therapy  not for  this condition      Precautions   Precautions  None      Restrictions   Weight Bearing Restrictions  No      Balance Screen   Has the patient fallen in the past 6 months  No      [place] residence    Living Arrangements  Spouse/significant other    Available Help at Discharge  Family    Type of [place] to enter    Entrance Stairs-Number of Steps  4    Entrance Stairs-Rails  Right    [place]  Two level;Able to live on main level with bedroom/bathroom    Alternate Level Stairs-Number of Steps  12    Alternate Level Stairs-Rails  Left    Home Equipment  Walker - 2 wheels;Wheelchair - manual;Cane - single point;Other (comment)   rollator     Prior Function   Level of Independence  Independent    Vocation  Retired    Leisure  garden, travel ([place], [place] and [place])      Cognition   Overall Cognitive Status  Within Functional Limits for tasks assessed      Observation/Other Assessments   Other Surveys   Activities of Balance and Confidence Scale;Dizziness Handicap Inventory (DHI)    Activities of Balance Confidence Scale (ABC Scale)   60.625%    Dizziness Handicap Inventory (DHI)   38/100      Standardized Balance Assessment   Standardized Balance Assessment  Dynamic Gait Index      Dynamic Gait Index   Level Surface  Mild Impairment    Change in Gait Speed  Mild Impairment    Gait with Horizontal Head Turns  Normal    Gait with Vertical Head Turns  Normal    Gait and Pivot Turn  Severe Impairment    Step Over Obstacle  Normal    Step Around Obstacles  Normal    Steps  Mild Impairment    Total Score  18           VESTIBULAR AND BALANCE EVALUATION   HISTORY:  Subjective history of current problem: Pt reports that about 3 months ago, after having COVID-19, he began having white flashes in his eyes, experiencing intermittent dizziness, and  headaches.  He reports when it first started, he would feel  it when he was transitioning from sitting to standing and would begin veering to the L.  He presents with baseline diplopia and wears prism lens.   Description of dizziness: feels disoriented, spinning sensation; a drunk sensation Frequency: daily for about 3 weeks, and then leveled off to 3x a week Duration: 5 min in total, but once he steadies himself the spinning stops almost immediately Symptom nature: positional changes - standing, bending forward  Provocative Factors: standing and starting to walk; turning quickly Easing Factors: holding onto something or someone to steadying himself  Progression of symptoms: better, less frequent History of similar episodes: no  Falls (yes/no): no Number of falls in past 6 months: no  Prior Functional Level: independent   Auditory complaints (tinnitus, pain, drainage, hearing loss, aural fullness): no Vision (diplopia, visual field loss, recent changes, last eye exam): white spots in April during COVID bout, but has resolved; glaucoma is being monitored; cataracts forming; 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     EXAMINATION  POSTURE: WNL  NEUROLOGICAL SCREEN: (2+ unless otherwise noted.) N=normal  Ab=abnormal  L3 - S2 on R diminished  Level Dermatome R L Myotome R L Reflex R L  C3 Anterior Neck N N Sidebend C2-3 N N Jaw CN V    C4 Top of Shoulder N N Shoulder Shrug C4 N N Hoffman's UMN    C5 Lateral Upper Arm N N Shoulder ABD C4-5 N N Biceps C5-6    C6 Lateral Arm/ Thumb N N Arm Flex/ Wrist Ext C5-6 N N Brachiorad. C5-6    C7 Middle Finger N N Arm Ext//Wrist Flex C6-7 N N Triceps C7    C8 4th & 5th Finger N N Flex/ Ext Carpi Ulnaris C8 N N Patellar (L3-4)    T1 Medial Arm N N Interossei T1 N N Gastrocnemius    L2 Medial thigh/groin N N Illiopsoas (L2-3) N N     L3 Lower thigh/med.knee A N  Quadriceps (L3-4) N N     L4 Medial leg/lat thigh A N Tibialis Ant (L4-5) N N     L5 Lat. leg & dorsal foot A N EHL (L5) N N     S1 post/lat foot/thigh/leg A N Gastrocnemius (S1-2) N N     S2 Post./med. thigh & leg A N Hamstrings (L4-S3) N N       Cranial Nerves Visual acuity and visual fields are intact  Extraocular muscles are intact  Facial sensation is intact bilaterally  Facial strength is intact bilaterally  Hearing is normal as tested by gross conversation Palate elevates midline, normal phonation  Shoulder shrug strength is intact  Tongue protrudes midline     MUSCULOSKELETAL SCREEN: Cervical Spine ROM: WFL and painless in all planes. No gross deficits identified   ROM: not formally assessed, but WFL throughout assessment  MMT: not formally assessed, but [facility] for tasks assessed  Gait: Ambulates with a slightly decreased self-selected gait speed, but step and stride length are equal bilaterally with no gross deficits noted during gait   POSTURAL CONTROL TESTS:   Clinical Test of Sensory Interaction for Balance    (CTSIB):  CONDITION TIME STRATEGY SWAY  Eyes open, firm surface 30 seconds ankle 1+  Eyes closed, firm surface 26.5 seconds ankle 3+  Eyes open, foam surface 30  seconds ankle 1+  Eyes closed, foam surface 10.9 seconds ankle 3+    OCULOMOTOR / VESTIBULAR TESTING:  Oculomotor Exam- Room Light  Findings Comments  Ocular Alignment normal   Ocular ROM normal   Spontaneous Nystagmus normal   Gaze-Holding Nystagmus normal   End-Gaze Nystagmus normal   Vergence (normal 2-3") abnormal 8-10"  Smooth Pursuit normal   Cross-Cover Test not examined   Saccades normal   VOR Cancellation normal   Left Head Thrust normal   Right Head Thrust normal   Static Acuity not examined   Dynamic Acuity not examined     Oculomotor Exam- Fixation Suppressed Deferred BPPV TESTS:  Symptoms Duration Intensity Nystagmus  L Dix-Hallpike None   None  R Dix-Hallpike None    None  L Head Roll None   None  R Head Roll None   None  L Sidelying Test      R Sidelying Test        FUNCTIONAL OUTCOME MEASURES   Results Comments  DGI 18/24   ABC Scale 60.625%   DHI 38/100      Objective measurements completed on examination: See above findings.     TREATMENT  Neuromuscular Re-education  VOR x 1 horizontal in sitting x 60s, pt denies dizziness; VOR x 1 horizontal in standing x 60s with feet apart, pt reports 5/10 dizziness; Written HEP provided to patient with extensive education about how to perform correctly at home.        PT Education - 12/30/18 1612    Education Details  HEP and plan of care    Person(s) Educated  Patient    Methods  Explanation    Comprehension  Verbalized understanding       PT Short Term Goals - 12/30/18 1250      PT SHORT TERM GOAL #1   Title  Pt will be independent with HEP in order to improve strength and balance in order to decrease fall risk and improve function at home    Time  4    Period  Weeks    Status  New    Target Date  01/27/19        PT Long Term Goals - 12/30/18 1250      PT LONG TERM GOAL #1   Title  Pt will improve DGI by at least 3 points in order to demonstrate clinically significant improvement in balance and decreased risk for falls.    Baseline  12/30/18: 18/24    Time  8    Period  Weeks    Status  New    Target Date  02/24/19      PT LONG TERM GOAL #2   Title  Pt will improve ABC by at least 13% in order to demonstrate clinically significant improvement in balance confidence.    Baseline  12/30/18: 60.6%    Time  8    Period  Weeks    Status  New    Target Date  02/24/19      PT LONG TERM GOAL #3   Title  Pt will decrease DHI score by at least 18 points in order to demonstrate clinically significant reduction in disability    Baseline  12/30/18: 38/100    Time  8    Period  Weeks    Status  New    Target Date  02/24/19             Plan - 12/30/18 1243  Clinical Impression Statement  Pt is a pleasant 73 year-old male referred for a L vestibular hypofunction.  PT examination reveals deficits with the DGI, mCTSIB, ABC, DHI, and convergence. He scored a 60.6% on the ABC, indicating that he has a low confidence level in his balance.  He scored a 38/100 on the [place], indicating that his dizziness is moderately impairing his ability to function.  He scored an 18/24 on the DGI, displaying a slowed gait speed and significant difficulty with pivot turns.  He also displays an 8-10" convergence deficit on oculomotor testing. On the mCTSIB he presents with deficits noted with eyes closed conditions.  Pt presents with deficits in balance and dizziness. Pt will benefit from skilled PT services to address deficits in balance and decrease risk for future falls.    Personal Factors and Comorbidities  Comorbidity 2    Comorbidities  anxiety, CAD    Examination-Activity Limitations  Other;Locomotion Level   rising   Examination-Participation Restrictions  Cleaning;Community Activity    Stability/Clinical Decision Making  Evolving/Moderate complexity    Clinical Decision Making  Moderate    Rehab Potential  Good    PT Frequency  1x / week    PT Duration  8 weeks    PT Treatment/Interventions  [place] Repostioning;Electrical Stimulation;Moist Heat;Cryotherapy;Therapeutic activities;Therapeutic exercise;Balance training;Neuromuscular re-education;Patient/family education;Manual techniques;Vestibular;Joint Manipulations;ADLs/Self Care Home Management;Aquatic Therapy;Biofeedback;Iontophoresis 4mg /ml Dexamethasone;Traction;Ultrasound;DME Instruction;Gait training;Stair training;Functional mobility training;Dry needling;Spinal Manipulations    PT Next Visit Plan  FGA, 10MWT?, progress vestibular exercises    PT Home Exercise Plan  VORx1 in standing 3x60s; 3-4x a day    Consulted and Agree with Plan of Care  Patient       Patient will benefit from skilled therapeutic  intervention in order to improve the following deficits and impairments:  Decreased balance, Dizziness  Visit Diagnosis: Dizziness and giddiness  Unsteadiness on feet     Problem List Patient Active Problem List   Diagnosis Date Noted  . Diastasis of rectus abdominis 03/18/2018  . Lymphedema 12/16/2015  . Chronic venous insufficiency 12/16/2015  . Leg swelling 12/16/2015  . 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 Anxiety 05/23/2015  . Acid reflux 05/23/2015  . HBV (hepatitis B virus) infection 05/23/2015  . History of cardiac catheterization 05/23/2015  . Combined fat and carbohydrate induced hyperlipemia 05/23/2015  . Arteriosclerosis of coronary artery 05/23/2015  . Clinical depression 05/23/2015  . Cough 07/26/2014  . Benign essential HTN 07/16/2014  . Breathlessness on exertion 02/12/2014  . 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 Bundle branch block, right 05/29/2012  . History of knee surgery 05/26/2012    This entire session was performed under direct supervision and direction of a licensed therapist/therapist assistant . I have personally read, edited and approve of the note as written.   Lutricia Horsfall, SPT Phillips Grout PT, DPT, GCS  Huprich,Jason 12/30/2018, 4:17 PM  [place] MAIN [facility] SERVICES [address] [place], [place], [address] Phone: [phone]   Fax:  [phone]  Name: LUIS SHEARER MRN: [id number] Date of Birth: 1945-12-18

## 2019-01-06 ENCOUNTER — Ambulatory Visit: Payer: Medicare Other | Attending: Psychology

## 2019-01-06 ENCOUNTER — Other Ambulatory Visit: Payer: Self-pay

## 2019-01-06 DIAGNOSIS — R2681 Unsteadiness on feet: Secondary | ICD-10-CM | POA: Diagnosis present

## 2019-01-06 DIAGNOSIS — R42 Dizziness and giddiness: Secondary | ICD-10-CM | POA: Insufficient documentation

## 2019-01-06 NOTE — Therapy (Signed)
[place] MAIN [facility] SERVICES [address] [place], [place], [address] Phone: [phone]   Fax:  [phone]  Physical Therapy Treatment  Patient Details  Name: Johnny Mccoy MRN: [id number] Date of Birth: [birth year] Referring Provider (PT): Dr. Christian Mate   Encounter Date: 01/06/2019  PT End of Session - 01/06/19 1040    Visit Number  2    Number of Visits  9    Date for PT Re-Evaluation  02/24/19    Authorization Type  eval 12/30/18    PT Start Time  1040    PT Stop Time  1122    PT Time Calculation (min)  42 min    Equipment Utilized During Treatment  Gait belt    Activity Tolerance  Patient tolerated treatment well    Behavior During Therapy  [facility] for tasks assessed/performed       Past Medical History:  Diagnosis Date  . Anxiety    takes Lorazepam daily  . Arthritis   . Asthma    Albuterol as needed.Breo daily as well as Flovent  . CAD (coronary artery disease)   . Cataract   . Cataracts, bilateral    immature  . Chronic back pain    compression  . Complication of anesthesia    seizures after partial knee in 2014.Surgery was in am and an still in post op until 9 that night. Was sent to [facility] for 5 days d/t uncontrollable shaking,incoherant  . Depressed    takes Pristiq daily  . Essential tremor   . GERD (gastroesophageal reflux disease)    takes Dexilant daily as well as Zantac  . 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 History of bronchitis    Jan-Mar 2017  . History of hiatal hernia   . Hyperlipidemia    takes Atorvastatin daily  . Hypertension    takes HCTZ,Amlodipine,and Metoprolol daily  . Insomnia    takes Trazodone nightly   . Joint pain   . Muscle spasm    in neck.Takes Flexeril as needed  . Numbness    right knee,right hand,and toes on left  . Seizures ([place])    takes Lamotrigine daily. Last seizure 6+ yrs ago    Past Surgical History:  Procedure Laterality Date  . CARDIAC CATHETERIZATION      2015  . CARPAL TUNNEL RELEASE    . COLONOSCOPY    . CORONARY ANGIOPLASTY WITH STENT PLACEMENT     x 3  . dental implants    . ESOPHAGOGASTRODUODENOSCOPY    . EYE SURGERY Bilateral   . EYE SURGERY     both eyes in 60's d/t muscle causing to squint  . HERNIA REPAIR     umbilical  . JOINT REPLACEMENT Right    knee  . KNEE ARTHROSCOPY    . MEDIAL PARTIAL KNEE REPLACEMENT Right   . MEDIASTERNOTOMY N/A 10/28/2015   Procedure: PARTIAL STERNOTOMY;  Surgeon: [name], MD;  Location: [place];  Service: Thoracic;  Laterality: N/A;  . numerous hand surgery    . 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   Procedure: RESECTION OF anterier MEDIASTINAL TUMOR;  Surgeon: [name], MD;  Location: [place];  Service: Thoracic;  Laterality: N/A;  . right hand fusion    . 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 TONSILLECTOMY      There were no vitals filed for this visit.  Subjective Assessment - 01/06/19 1045  Subjective  Pt states that he has done a lot of gardening this week and that his back and neck are sore, but that he has not been dizzy.  He reports that the "CAT" works better than the "X" for his VOR x 1 exercises. He endorses no falls since last visit. No new questions or concerns at this time.    Pertinent History  Pt reports that about 3 months ago, after having COVID-19, he began having white flashes in his eyes, experiencing intermittent dizziness, and headaches.  He reports when it first started, he would feel it when he was transitioning from sitting to standing and would begin veering to the L.  He presents with baseline diplopia and wears prism lens. VNG study was negative with the exception of 22% left vestibular weakness    Limitations  Walking;Other (comment)   rising/positional changes   Patient Stated Goals  decrease dizziness episodes    Currently in Pain?  No/denies        TREATMENT  Neuromuscular Re-education  NuSTEP 5 min L3 during  history  VOR x 1 horizontal in standing 3x 60s with feet together pt reports 4/10 dizziness after each rep;  VORx1 vertical x60s with no dizziness reported   Standing horizontal head turns 2x60s in each direction in semitandem 5/10; 6/10dizziness after last set  Forward gait with vertical ball raises with head/eye follow 43' x2 with 3/10 dizziness reported  Forward gait with horizontal self ball passes 75'x4 with 6-7/10 dizziness reported after each rep, with quick resolution of symptoms.  CGA provided throughout with 1-2 instances of minA for steadying.     Pt educated throughout session about proper posture and technique with exercises. Improved exercise technique, movement at target joints, use of target muscles after min to mod verbal, visual, tactile cues.    Pt demonstrates excellent motivation throughout today's session.  He endorses mild dizziness with VORx1 exercises on a plain background today, demonstrating good speed and control throughout.  Forward gait with horizontal self ball passes were trlaled today with a 6-7/10 dizziness endorsed after each set, however he has quick resolution of symptoms.  Semitandem horiziontal head turns were added to his HEP today.  Pt will benefit from skilled PT services to address deficits in balance and decrease risk for future falls.                            PT Short Term Goals - 12/30/18 1250      PT SHORT TERM GOAL #1   Title  Pt will be independent with HEP in order to improve strength and balance in order to decrease fall risk and improve function at home    Time  4    Period  Weeks    Status  New    Target Date  01/27/19        PT Long Term Goals - 12/30/18 1250      PT LONG TERM GOAL #1   Title  Pt will improve DGI by at least 3 points in order to demonstrate clinically significant improvement in balance and decreased risk for falls.    Baseline  12/30/18: 18/24    Time  8    Period  Weeks    Status   New    Target Date  02/24/19      PT LONG TERM GOAL #2   Title  Pt will improve ABC by at least 13% in  order to demonstrate clinically significant improvement in balance confidence.    Baseline  12/30/18: 60.6%    Time  8    Period  Weeks    Status  New    Target Date  02/24/19      PT LONG TERM GOAL #3   Title  Pt will decrease DHI score by at least 18 points in order to demonstrate clinically significant reduction in disability    Baseline  12/30/18: 38/100    Time  8    Period  Weeks    Status  New    Target Date  02/24/19            Plan - 01/06/19 1140    Clinical Impression Statement  Pt demonstrates excellent motivation throughout today's session.  He endorses mild dizziness with VORx1 exercises on a plain background today, demonstrating good speed and control throughout.  Forward gait with horizontal self ball passes were trlaled today with a 6-7/10 dizziness endorsed after each set, however he has quick resolution of symptoms.  Semitandem horiziontal head turns were added to his HEP today.  Pt will benefit from skilled PT services to address deficits in balance and decrease risk for future falls.    Personal Factors and Comorbidities  Comorbidity 2    Comorbidities  anxiety, CAD    Examination-Activity Limitations  Other;Locomotion Level   rising   Examination-Participation Restrictions  Cleaning;Community Activity    Stability/Clinical Decision Making  Evolving/Moderate complexity    Rehab Potential  Good    PT Frequency  1x / week    PT Duration  8 weeks    PT Treatment/Interventions  Canalith Repostioning;Electrical Stimulation;Moist Heat;Cryotherapy;Therapeutic activities;Therapeutic exercise;Balance training;Neuromuscular re-education;Patient/family education;Manual techniques;Vestibular;Joint Manipulations;ADLs/Self Care Home Management;Aquatic Therapy;Biofeedback;Iontophoresis 4mg /ml Dexamethasone;Traction;Ultrasound;DME Instruction;Gait training;Stair  training;Functional mobility training;Dry needling;Spinal Manipulations    PT Next Visit Plan  progress vestibular exercises    PT Home Exercise Plan  [id number]: VORx1 in standing 3x60s; 3-4x a day; semitandem stance with horizontal head turns    Consulted and Agree with Plan of Care  Patient       Patient will benefit from skilled therapeutic intervention in order to improve the following deficits and impairments:  Decreased balance, Dizziness  Visit Diagnosis: Dizziness and giddiness  Unsteadiness on feet     Problem List Patient Active Problem List   Diagnosis Date Noted  . Diastasis of rectus abdominis 03/18/2018  . Lymphedema 12/16/2015  . Chronic venous insufficiency 12/16/2015  . Leg swelling 12/16/2015  . 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 Anxiety 05/23/2015  . Acid reflux 05/23/2015  . HBV (hepatitis B virus) infection 05/23/2015  . History of cardiac catheterization 05/23/2015  . Combined fat and carbohydrate induced hyperlipemia 05/23/2015  . Arteriosclerosis of coronary artery 05/23/2015  . Clinical depression 05/23/2015  . Cough 07/26/2014  . Benign essential HTN 07/16/2014  . Breathlessness on exertion 02/12/2014  . 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 Bundle branch block, right 05/29/2012  . History of knee surgery 05/26/2012     Lutricia Horsfall, SPT Phillips Grout PT, DPT, GCS  Huprich,Jason 01/06/2019, 12:38 PM  [place] MAIN [facility] SERVICES [address] [place], [place], [address] Phone: [phone]   Fax:  [phone]  Name: JACIAN SHIELDS MRN: [id number] Date of Birth: [birth year]

## 2019-01-06 NOTE — Patient Instructions (Signed)
Access Code: [email or web address]  URL: [email or web address]  Date: 01/06/2019  Prepared by: Roxana Hires   Exercises Standing Gaze Stabilization with Head Rotation - 3 reps - 60 seconds hold - 4x daily - 7x weekly Half Tandem Stance Balance with Head Rotation - 3 reps - 30s x 3 with each foot forward hold - 4x daily - 7x weekly

## 2019-01-10 ENCOUNTER — Other Ambulatory Visit: Payer: Self-pay

## 2019-01-10 ENCOUNTER — Ambulatory Visit: Payer: Medicare Other | Admitting: Physical Therapy

## 2019-01-10 ENCOUNTER — Encounter: Payer: Self-pay | Admitting: Physical Therapy

## 2019-01-10 DIAGNOSIS — R42 Dizziness and giddiness: Secondary | ICD-10-CM | POA: Diagnosis not present

## 2019-01-10 DIAGNOSIS — R2681 Unsteadiness on feet: Secondary | ICD-10-CM

## 2019-01-10 NOTE — Therapy (Signed)
[place] MAIN [facility] SERVICES [address] [place], [place], [address] Phone: [phone]   Fax:  [phone]  Physical Therapy Treatment  Patient Details  Name: Johnny Mccoy MRN: [id number] Date of Birth: Sep 11, 1945 Referring Provider (PT): Dr. Christian Mate   Encounter Date: 01/10/2019  PT End of Session - 01/10/19 1509    Visit Number  3    Number of Visits  9    Date for PT Re-Evaluation  02/24/19    Authorization Type  eval 12/30/18    PT Start Time  1350    PT Stop Time  1431    PT Time Calculation (min)  41 min    Equipment Utilized During Treatment  Gait belt    Activity Tolerance  Patient tolerated treatment well    Behavior During Therapy  [facility] for tasks assessed/performed       Past Medical History:  Diagnosis Date  . Anxiety    takes Lorazepam daily  . Arthritis   . Asthma    Albuterol as needed.Breo daily as well as Flovent  . CAD (coronary artery disease)   . Cataract   . Cataracts, bilateral    immature  . Chronic back pain    compression  . Complication of anesthesia    seizures after partial knee in 2014.Surgery was in am and an still in post op until 9 that night. Was sent to [facility] for 5 days d/t uncontrollable shaking,incoherant  . Depressed    takes Pristiq daily  . Essential tremor   . GERD (gastroesophageal reflux disease)    takes Dexilant daily as well as Zantac  . 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 History of bronchitis    Jan-Mar 2017  . History of hiatal hernia   . Hyperlipidemia    takes Atorvastatin daily  . Hypertension    takes HCTZ,Amlodipine,and Metoprolol daily  . Insomnia    takes Trazodone nightly   . Joint pain   . Muscle spasm    in neck.Takes Flexeril as needed  . Numbness    right knee,right hand,and toes on left  . Seizures ([place])    takes Lamotrigine daily. Last seizure 6+ yrs ago    Past Surgical History:  Procedure Laterality Date  . CARDIAC CATHETERIZATION     2015  . CARPAL TUNNEL RELEASE    . COLONOSCOPY    . CORONARY ANGIOPLASTY WITH STENT PLACEMENT     x 3  . dental implants    . ESOPHAGOGASTRODUODENOSCOPY    . EYE SURGERY Bilateral   . EYE SURGERY     both eyes in 60's d/t muscle causing to squint  . HERNIA REPAIR     umbilical  . JOINT REPLACEMENT Right    knee  . KNEE ARTHROSCOPY    . MEDIAL PARTIAL KNEE REPLACEMENT Right   . MEDIASTERNOTOMY N/A 10/28/2015   Procedure: PARTIAL STERNOTOMY;  Surgeon: [name], MD;  Location: [place];  Service: Thoracic;  Laterality: N/A;  . numerous hand surgery    . 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   Procedure: RESECTION OF anterier MEDIASTINAL TUMOR;  Surgeon: [name], MD;  Location: [place];  Service: Thoracic;  Laterality: N/A;  . right hand fusion    . 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 TONSILLECTOMY      There were no vitals filed for this visit.  Subjective Assessment - 01/10/19 1352  Subjective  Patient reports he has been doing good and has not had any episodes of imbalance this past week. Patient said he was able to do a lot of gardening without issues this past week. Patient reports he has been doing fine with his HEP and has no questions.    Pertinent History  Pt reports that about 3 months ago, after having COVID-19, he began having white flashes in his eyes, experiencing intermittent dizziness, and headaches.  He reports when it first started, he would feel it when he was transitioning from sitting to standing and would begin veering to the L.  He presents with baseline diplopia and wears prism lens. VNG study was negative with the exception of 22% left vestibular weakness    Limitations  Walking;Other (comment)   rising/positional changes   Patient Stated Goals  decrease dizziness episodes       Neuromuscular Re-education:  VOR X 1 exercise:  Patient performed VOR X 1 horizontal in standing with narrow base of support with  conflicting background 3 reps of 1 minute each with verbal cues for technique.  Patient denies dizziness with this activity.   Airex pad:  On Airex pad, patient performed feet together and semi-tandem progressions with alternating lead leg with and without body turns, eyes closed and horizontal and vertical head turns.  Performed multiple 30-45 second trials. Patient with increased sway with eyes closed, but was able to do 30 second trials.   Ball tracking: Patient performed multiple 88' trials of forward and retro ambulation with ball follows horizontal.  Ball toss over shoulder: Patient performed multiple 62' trials of forward ambulation while tossing ball over one shoulder with return catch over opposite shoulder with CGA.  Patient performed multiple 77' trials of forward ambulation while tossing ball over one shoulder with return catch over opposite shoulder varying the ball position to head, shoulder and waist level to promote head turning and tilting. Patient reports increase in dizziness rated 7/10 and imbalance with this activity.  Ambulation with head turns:  Patient performed 175' forwards and retro ambulation while scanning for targets in hallway. Patient required Mod/Min A secondary to 3 losses of balance requiring assist to correct. Patient performed 39' trials of forward ambulation with self-selected head turns horizontally, vertically and diagonally with CGA/MinA.  Patient reports dizziness and imbalance with ambulation.   Note: patient fatigues quickly with ambulation tasks and required several short, sitting resting breaks.    PT Education - 01/10/19 1511    Education Details  discussed HEP-added progression of conflicting background and discussed progression of semi-tandem on firm and pillow    Person(s) Educated  Patient    Methods  Explanation;Demonstration;Verbal cues    Comprehension  Verbalized understanding;Returned demonstration       PT Short Term Goals -  12/30/18 1250      PT SHORT TERM GOAL #1   Title  Pt will be independent with HEP in order to improve strength and balance in order to decrease fall risk and improve function at home    Time  4    Period  Weeks    Status  New    Target Date  01/27/19        PT Long Term Goals - 12/30/18 1250      PT LONG TERM GOAL #1   Title  Pt will improve DGI by at least 3 points in order to demonstrate clinically significant improvement in balance and decreased risk for falls.    Baseline  12/30/18: 18/24    Time  8    Period  Weeks    Status  New    Target Date  02/24/19      PT LONG TERM GOAL #2   Title  Pt will improve ABC by at least 13% in order to demonstrate clinically significant improvement in balance confidence.    Baseline  12/30/18: 60.6%    Time  8    Period  Weeks    Status  New    Target Date  02/24/19      PT LONG TERM GOAL #3   Title  Pt will decrease DHI score by at least 18 points in order to demonstrate clinically significant reduction in disability    Baseline  12/30/18: 38/100    Time  8    Period  Weeks    Status  New    Target Date  02/24/19         Plan - 01/12/19 0954    Clinical Impression Statement  Patient demonstrated good technique with VOR X 1 exercise this date and was able to progress to narorw base of support with conflicting background. Added this progression to HEP. Patient reports imbalance and dizziness with all walking activities with vestibular tasks such as head turning, ball follows and scanning for targets. In addition, patient with imbalance requiring mod/min A to correct with retro ambulation while scanning for targets. Patient fatigues with ambulation actvities and requires short, sitting rest breaks after a few trials. Patient is motivated to session and reports good compliance with HEP. Patient also had difficulty with eyes closed activites on Airex pad with increased sway noted. Patient would benefit from continued PT services to further  address deficits and goals as set on plan of care.    Personal Factors and Comorbidities  Comorbidity 2    Comorbidities  anxiety, CAD    Examination-Activity Limitations  Other;Locomotion Level   rising   Examination-Participation Restrictions  Cleaning;Community Activity    Stability/Clinical Decision Making  Evolving/Moderate complexity    Rehab Potential  Good    PT Frequency  1x / week    PT Duration  8 weeks    PT Treatment/Interventions  Canalith Repostioning;Electrical Stimulation;Moist Heat;Cryotherapy;Therapeutic activities;Therapeutic exercise;Balance training;Neuromuscular re-education;Patient/family education;Manual techniques;Vestibular;Joint Manipulations;ADLs/Self Care Home Management;Aquatic Therapy;Biofeedback;Iontophoresis 4mg /ml Dexamethasone;Traction;Ultrasound;DME Instruction;Gait training;Stair training;Functional mobility training;Dry needling;Spinal Manipulations    PT Next Visit Plan  progress vestibular exercises    PT Home Exercise Plan  [id number]: VORx1 in standing 3x60s; 3-4x a day; semitandem stance with horizontal head turns    Consulted and Agree with Plan of Care  Patient            Patient will benefit from skilled therapeutic intervention in order to improve the following deficits and impairments:     Visit Diagnosis: Dizziness and giddiness  Unsteadiness on feet     Problem List Patient Active Problem List   Diagnosis Date Noted  . Diastasis of rectus abdominis 03/18/2018  . Lymphedema 12/16/2015  . Chronic venous insufficiency 12/16/2015  . Leg swelling 12/16/2015  . 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 Anxiety 05/23/2015  . Acid reflux 05/23/2015  . HBV (hepatitis B virus) infection 05/23/2015  . History of cardiac catheterization 05/23/2015  . Combined fat and carbohydrate induced hyperlipemia 05/23/2015  . Arteriosclerosis of coronary artery 05/23/2015  . Clinical depression 05/23/2015  . Cough 07/26/2014  .  Benign essential HTN 07/16/2014  . Breathlessness on exertion 02/12/2014  . Ischemic embolic stroke (  [place]) 06/01/2013  . Bundle branch block, right 05/29/2012  . History of knee surgery 05/26/2012   Lady Deutscher PT, DPT [phone] Lady Deutscher 01/10/2019, 3:12 PM  [employer] MAIN [facility] SERVICES [address] [place], [place], [address] Phone: [phone]   Fax:  [phone]  Name: KAVYN RAMKISSOON MRN: [id number] Date of Birth: 1945/05/10

## 2019-01-20 ENCOUNTER — Other Ambulatory Visit: Payer: Self-pay

## 2019-01-20 ENCOUNTER — Ambulatory Visit: Payer: Medicare Other

## 2019-01-20 DIAGNOSIS — R42 Dizziness and giddiness: Secondary | ICD-10-CM | POA: Diagnosis not present

## 2019-01-20 DIAGNOSIS — R2681 Unsteadiness on feet: Secondary | ICD-10-CM

## 2019-01-20 NOTE — Therapy (Signed)
[place] MAIN [facility] SERVICES [address] [place], [place], [address] Phone: [phone]   Fax:  [phone]  Physical Therapy Treatment/Discharge  Patient Details  Name: Johnny Mccoy MRN: [id number] Date of Birth: [birth year] Referring Provider (PT): Dr. Christian Mate   Encounter Date: 01/20/2019  PT End of Session - 01/20/19 1034    Visit Number  4    Number of Visits  9    Date for PT Re-Evaluation  02/24/19    Authorization Type  eval 12/30/18    PT Start Time  1040    PT Stop Time  1125    PT Time Calculation (min)  45 min    Equipment Utilized During Treatment  Gait belt    Activity Tolerance  Patient tolerated treatment well    Behavior During Therapy  [facility] for tasks assessed/performed       Past Medical History:  Diagnosis Date  . Anxiety    takes Lorazepam daily  . Arthritis   . Asthma    Albuterol as needed.Breo daily as well as Flovent  . CAD (coronary artery disease)   . Cataract   . Cataracts, bilateral    immature  . Chronic back pain    compression  . Complication of anesthesia    seizures after partial knee in 2014.Surgery was in am and an still in post op until 9 that night. Was sent to [facility] for 5 days d/t uncontrollable shaking,incoherant  . Depressed    takes Pristiq daily  . Essential tremor   . GERD (gastroesophageal reflux disease)    takes Dexilant daily as well as Zantac  . 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 History of bronchitis    Jan-Mar 2017  . History of hiatal hernia   . Hyperlipidemia    takes Atorvastatin daily  . Hypertension    takes HCTZ,Amlodipine,and Metoprolol daily  . Insomnia    takes Trazodone nightly   . Joint pain   . Muscle spasm    in neck.Takes Flexeril as needed  . Numbness    right knee,right hand,and toes on left  . Seizures ([place])    takes Lamotrigine daily. Last seizure 6+ yrs ago    Past Surgical History:  Procedure Laterality Date  . CARDIAC  CATHETERIZATION     2015  . CARPAL TUNNEL RELEASE    . COLONOSCOPY    . CORONARY ANGIOPLASTY WITH STENT PLACEMENT     x 3  . dental implants    . ESOPHAGOGASTRODUODENOSCOPY    . EYE SURGERY Bilateral   . EYE SURGERY     both eyes in 60's d/t muscle causing to squint  . HERNIA REPAIR     umbilical  . JOINT REPLACEMENT Right    knee  . KNEE ARTHROSCOPY    . MEDIAL PARTIAL KNEE REPLACEMENT Right   . MEDIASTERNOTOMY N/A 10/28/2015   Procedure: PARTIAL STERNOTOMY;  Surgeon: [name], MD;  Location: [place];  Service: Thoracic;  Laterality: N/A;  . numerous hand surgery    . 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   Procedure: RESECTION OF anterier MEDIASTINAL TUMOR;  Surgeon: [name], MD;  Location: [place];  Service: Thoracic;  Laterality: N/A;  . right hand fusion    . 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 TONSILLECTOMY      There were no vitals filed for this visit.  Subjective Assessment - 01/20/19 1042  Subjective  Patient reports he has been doing good and has not had any episodes of dizziness and  imbalance this past week.  Patient reports he has been doing fine with his HEP and has no questions.    Pertinent History  Pt reports that about 3 months ago, after having COVID-19, he began having white flashes in his eyes, experiencing intermittent dizziness, and headaches.  He reports when it first started, he would feel it when he was transitioning from sitting to standing and would begin veering to the L.  He presents with baseline diplopia and wears prism lens. VNG study was negative with the exception of 22% left vestibular weakness    Limitations  Walking;Other (comment)   rising/positional changes   Patient Stated Goals  decrease dizziness episodes    Currently in Pain?  Yes    Pain Score  6     Pain Location  Back    Pain Orientation  Lower    Pain Descriptors / Indicators  Throbbing    Pain Type  Chronic pain    Pain Onset  More  than a month ago    Pain Frequency  Constant          [facility] PT Assessment - 01/20/19 0001      Observation/Other Assessments   Other Surveys   Activities of Balance and Confidence Scale;Dizziness Handicap Inventory ([place])    Activities of Balance Confidence Scale (ABC Scale)   94.4%    Dizziness Handicap Inventory [facility])   2/100      Standardized Balance Assessment   Standardized Balance Assessment  Dynamic Gait Index      Dynamic Gait Index   Level Surface  Normal    Change in Gait Speed  Normal    Gait with Horizontal Head Turns  Normal    Gait with Vertical Head Turns  Normal    Gait and Pivot Turn  Normal    Step Over Obstacle  Normal    Step Around Obstacles  Normal    Steps  Mild Impairment    Total Score  23         TREATMENT   Neuromuscular Re-education:  VOR X 1 exercise:  Patient performed VOR X 1 horizontal with forward and retro gait x1 in each direction with conflicting background; 1x forward with narrow gait; Patient denies dizziness with all conditions.   Ball tracking: Patient performed 66' of forward and retro ambulation with ball follows horizontal with no reported dizziness  Body Wall Rolls: Single and double body rolls on wall performed in each direction against the wall, with eyes closed, and away from wall with no reported dizziness  Outcome Measures: DGI: 23/24 DHI: 2/100 ABC: 94.4%  Pt educated throughout session about proper posture and technique with exercises. Improved exercise technique, movement at target joints, use of target muscles after min to mod verbal, visual, tactile cues.    Pt demonstrates excellent motivation throughout today's session.  He endorses no dizziness throughout all interventions today, even with increasing difficulty and challenge.  His DGI has improved from an 18/24 to a 23/24, indicating that his balance and unsteadiness has greatly improved.  His [place] has improved from a 38/100 to a 2/100, and his ABC has  improved from a 60.6% to a 94.4%, indicating that his confidence in his balance and perception of dizziness has greatly improved.  He will be discharged on this date however his episode of care won't be resolved at pt request until he  ensures that his symptoms remain improved. He will call to set up more appts if symptoms or problems arise before the end of his certification period. Pt is confident with his home exercise program and is able to continue independently at this time.                        PT Short Term Goals - 01/20/19 1236      PT SHORT TERM GOAL #1   Title  Pt will be independent with HEP in order to improve strength and balance in order to decrease fall risk and improve function at home    Time  4    Period  Weeks    Status  Achieved    Target Date  01/27/19        PT Long Term Goals - 01/20/19 1236      PT LONG TERM GOAL #1   Title  Pt will improve DGI by at least 3 points in order to demonstrate clinically significant improvement in balance and decreased risk for falls.    Baseline  12/30/18: 18/24; 01/20/19: 23/24    Time  8    Period  Weeks    Status  Achieved      PT LONG TERM GOAL #2   Title  Pt will improve ABC by at least 13% in order to demonstrate clinically significant improvement in balance confidence.    Baseline  12/30/18: 60.6%; 01/20/19: 94.4%    Time  8    Period  Weeks    Status  Achieved      PT LONG TERM GOAL #3   Title  Pt will decrease DHI score by at least 18 points in order to demonstrate clinically significant reduction in disability    Baseline  12/30/18: 38/100; 01/20/19: 2/100    Time  8    Period  Weeks    Status  Achieved            Plan - 01/20/19 1131    Clinical Impression Statement  Pt demonstrates excellent motivation throughout today's session.  He endorses no dizziness throughout all interventions today, even with increasing difficulty and challenge.  His DGI has improved from an 18/24 to a 23/24,  indicating that his balance and unsteadiness has greatly improved.  His [place] has improved from a 38/100 to a 2/100, and his ABC has improved from a 60.6% to a 94.4%, indicating that his confidence in his balance and perception of dizziness has greatly improved.  He will be discharged on this date however his episode of care won't be resolved at pt request until he ensures that his symptoms remain improved. He will call to set up more appts if symptoms or problems arise before the end of his certification period. Pt is confident with his home exercise program and is able to continue independently at this time.    Personal Factors and Comorbidities  Comorbidity 2    Comorbidities  anxiety, CAD    Examination-Activity Limitations  Other;Locomotion Level   rising   Examination-Participation Restrictions  Cleaning;Community Activity    Stability/Clinical Decision Making  Evolving/Moderate complexity    Rehab Potential  Good    PT Frequency  1x / week    PT Duration  8 weeks    PT Treatment/Interventions  Canalith Repostioning;Electrical Stimulation;Moist Heat;Cryotherapy;Therapeutic activities;Therapeutic exercise;Balance training;Neuromuscular re-education;Patient/family education;Manual techniques;Vestibular;Joint Manipulations;ADLs/Self Care Home Management;Aquatic Therapy;Biofeedback;Iontophoresis 4mg /ml Dexamethasone;Traction;Ultrasound;DME Instruction;Gait training;Stair training;Functional mobility training;Dry needling;Spinal Manipulations    PT  Next Visit Plan  Discharge    PT Home Exercise Plan  [id number]: VORx1 in standing 3x60s; 3-4x a day; semitandem stance with horizontal head turns    Consulted and Agree with Plan of Care  Patient       Patient will benefit from skilled therapeutic intervention in order to improve the following deficits and impairments:  Decreased balance, Dizziness  Visit Diagnosis: Dizziness and giddiness  Unsteadiness on feet     Problem List Patient Active  Problem List   Diagnosis Date Noted  . Diastasis of rectus abdominis 03/18/2018  . Lymphedema 12/16/2015  . Chronic venous insufficiency 12/16/2015  . Leg swelling 12/16/2015  . 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 Anxiety 05/23/2015  . Acid reflux 05/23/2015  . HBV (hepatitis B virus) infection 05/23/2015  . History of cardiac catheterization 05/23/2015  . Combined fat and carbohydrate induced hyperlipemia 05/23/2015  . Arteriosclerosis of coronary artery 05/23/2015  . Clinical depression 05/23/2015  . Cough 07/26/2014  . Benign essential HTN 07/16/2014  . Breathlessness on exertion 02/12/2014  . Ischemic embolic stroke ([place]) 123456  . Bundle branch block, right 05/29/2012  . History of knee surgery 05/26/2012    This entire session was performed under direct supervision and direction of a licensed therapist/therapist assistant . I have personally read, edited and approve of the note as written.   Lutricia Horsfall, SPT Phillips Grout PT, DPT, GCS  Huprich,Jason 01/20/2019, 4:44 PM  [place] MAIN [facility] SERVICES [address] [place], [place], [address] Phone: [phone]   Fax:  [phone]  Name: Johnny Mccoy MRN: [id number] Date of Birth: 12/07/1945

## 2019-01-23 ENCOUNTER — Ambulatory Visit: Payer: Medicare Other

## 2019-03-29 ENCOUNTER — Other Ambulatory Visit: Payer: Self-pay | Admitting: Neurological Surgery

## 2019-03-29 DIAGNOSIS — Z981 Arthrodesis status: Secondary | ICD-10-CM

## 2019-03-29 DIAGNOSIS — G8929 Other chronic pain: Secondary | ICD-10-CM

## 2019-03-29 DIAGNOSIS — M5441 Lumbago with sciatica, right side: Secondary | ICD-10-CM

## 2019-03-29 DIAGNOSIS — I209 Angina pectoris, unspecified: Secondary | ICD-10-CM

## 2019-03-29 DIAGNOSIS — M5442 Lumbago with sciatica, left side: Secondary | ICD-10-CM

## 2019-03-29 DIAGNOSIS — M4807 Spinal stenosis, lumbosacral region: Secondary | ICD-10-CM

## 2019-04-10 ENCOUNTER — Other Ambulatory Visit: Payer: Self-pay

## 2019-04-10 ENCOUNTER — Other Ambulatory Visit
Admission: RE | Admit: 2019-04-10 | Discharge: 2019-04-10 | Disposition: A | Discharge status: Home / Self Care | Payer: Medicare Other | Source: Ambulatory Visit | Attending: Internal Medicine | Admitting: Internal Medicine

## 2019-04-10 DIAGNOSIS — Z01812 Encounter for preprocedural laboratory examination: Secondary | ICD-10-CM | POA: Diagnosis present

## 2019-04-10 DIAGNOSIS — Z20822 Contact with and (suspected) exposure to covid-19: Secondary | ICD-10-CM | POA: Diagnosis not present

## 2019-04-11 LAB — SARS CORONAVIRUS 2 (TAT 6-24 HRS): SARS Coronavirus 2: NEGATIVE

## 2019-04-12 ENCOUNTER — Ambulatory Visit
Admission: RE | Admit: 2019-04-12 | Discharge: 2019-04-12 | Disposition: A | Discharge status: Home / Self Care | Payer: Medicare Other | Attending: Internal Medicine | Admitting: Internal Medicine

## 2019-04-12 ENCOUNTER — Encounter
Admission: RE | Disposition: A | Discharge status: Home / Self Care | Payer: Self-pay | Source: Home / Self Care | Attending: Internal Medicine

## 2019-04-12 ENCOUNTER — Other Ambulatory Visit: Payer: Self-pay

## 2019-04-12 ENCOUNTER — Encounter: Payer: Self-pay | Admitting: Internal Medicine

## 2019-04-12 DIAGNOSIS — I350 Nonrheumatic aortic (valve) stenosis: Secondary | ICD-10-CM | POA: Insufficient documentation

## 2019-04-12 DIAGNOSIS — Z7982 Long term (current) use of aspirin: Secondary | ICD-10-CM | POA: Insufficient documentation

## 2019-04-12 DIAGNOSIS — I25119 Atherosclerotic heart disease of native coronary artery with unspecified angina pectoris: Secondary | ICD-10-CM | POA: Insufficient documentation

## 2019-04-12 DIAGNOSIS — G4733 Obstructive sleep apnea (adult) (pediatric): Secondary | ICD-10-CM | POA: Diagnosis not present

## 2019-04-12 DIAGNOSIS — R931 Abnormal findings on diagnostic imaging of heart and coronary circulation: Secondary | ICD-10-CM

## 2019-04-12 DIAGNOSIS — I4510 Unspecified right bundle-branch block: Secondary | ICD-10-CM | POA: Insufficient documentation

## 2019-04-12 DIAGNOSIS — Z7951 Long term (current) use of inhaled steroids: Secondary | ICD-10-CM | POA: Insufficient documentation

## 2019-04-12 DIAGNOSIS — R079 Chest pain, unspecified: Secondary | ICD-10-CM

## 2019-04-12 DIAGNOSIS — K219 Gastro-esophageal reflux disease without esophagitis: Secondary | ICD-10-CM | POA: Insufficient documentation

## 2019-04-12 DIAGNOSIS — R569 Unspecified convulsions: Secondary | ICD-10-CM | POA: Insufficient documentation

## 2019-04-12 DIAGNOSIS — R0602 Shortness of breath: Secondary | ICD-10-CM | POA: Insufficient documentation

## 2019-04-12 DIAGNOSIS — I6523 Occlusion and stenosis of bilateral carotid arteries: Secondary | ICD-10-CM | POA: Diagnosis not present

## 2019-04-12 DIAGNOSIS — I10 Essential (primary) hypertension: Secondary | ICD-10-CM | POA: Diagnosis not present

## 2019-04-12 DIAGNOSIS — E782 Mixed hyperlipidemia: Secondary | ICD-10-CM | POA: Diagnosis not present

## 2019-04-12 DIAGNOSIS — F329 Major depressive disorder, single episode, unspecified: Secondary | ICD-10-CM | POA: Diagnosis not present

## 2019-04-12 DIAGNOSIS — Z8673 Personal history of transient ischemic attack (TIA), and cerebral infarction without residual deficits: Secondary | ICD-10-CM | POA: Insufficient documentation

## 2019-04-12 DIAGNOSIS — Z951 Presence of aortocoronary bypass graft: Secondary | ICD-10-CM | POA: Insufficient documentation

## 2019-04-12 DIAGNOSIS — I209 Angina pectoris, unspecified: Secondary | ICD-10-CM

## 2019-04-12 DIAGNOSIS — Z79899 Other long term (current) drug therapy: Secondary | ICD-10-CM | POA: Diagnosis not present

## 2019-04-12 HISTORY — PX: LEFT HEART CATH AND CORONARY ANGIOGRAPHY: CATH118249

## 2019-04-12 SURGERY — LEFT HEART CATH AND CORONARY ANGIOGRAPHY
Anesthesia: Moderate Sedation | Laterality: Left

## 2019-04-12 MED ORDER — ACETAMINOPHEN 325 MG PO TABS: 650.0000 mg | ORAL_TABLET | ORAL | Status: DC | PRN

## 2019-04-12 MED ORDER — HEPARIN (PORCINE) IN NACL 1000-0.9 UT/500ML-% IV SOLN
INTRAVENOUS | Status: AC
  Filled 2019-04-12: qty 1000

## 2019-04-12 MED ORDER — HYDRALAZINE HCL 20 MG/ML IJ SOLN: 10.0000 mg | INTRAMUSCULAR | Status: DC | PRN

## 2019-04-12 MED ORDER — SODIUM CHLORIDE 0.9% FLUSH: 3.0000 mL | INTRAVENOUS | Status: DC | PRN

## 2019-04-12 MED ORDER — VERAPAMIL HCL 2.5 MG/ML IV SOLN
INTRAVENOUS | Status: DC | PRN
  Administered 2019-04-12: 2.5 mg via INTRA_ARTERIAL

## 2019-04-12 MED ORDER — HEPARIN SODIUM (PORCINE) 1000 UNIT/ML IJ SOLN
INTRAMUSCULAR | Status: DC | PRN
  Administered 2019-04-12: 5000 [IU] via INTRAVENOUS

## 2019-04-12 MED ORDER — HEPARIN (PORCINE) IN NACL 1000-0.9 UT/500ML-% IV SOLN
INTRAVENOUS | Status: DC | PRN
  Administered 2019-04-12: 500 mL

## 2019-04-12 MED ORDER — ONDANSETRON HCL 4 MG/2ML IJ SOLN: 4.0000 mg | Freq: Four times a day (QID) | INTRAMUSCULAR | Status: DC | PRN

## 2019-04-12 MED ORDER — SODIUM CHLORIDE 0.9 % WEIGHT BASED INFUSION: 118.3000 mL/h | INTRAVENOUS | Status: AC

## 2019-04-12 MED ORDER — SODIUM CHLORIDE 0.9 % WEIGHT BASED INFUSION: 1.0000 mL/kg/h | INTRAVENOUS | Status: DC

## 2019-04-12 MED ORDER — LABETALOL HCL 5 MG/ML IV SOLN: 10.0000 mg | INTRAVENOUS | Status: DC | PRN

## 2019-04-12 MED ORDER — IOHEXOL 300 MG/ML  SOLN
INTRAMUSCULAR | Status: DC | PRN
  Administered 2019-04-12: 90 mL

## 2019-04-12 MED ORDER — MIDAZOLAM HCL 2 MG/2ML IJ SOLN
INTRAMUSCULAR | Status: DC | PRN
  Administered 2019-04-12: 2 mg via INTRAVENOUS

## 2019-04-12 MED ORDER — FENTANYL CITRATE (PF) 100 MCG/2ML IJ SOLN
INTRAMUSCULAR | Status: AC
  Filled 2019-04-12: qty 2

## 2019-04-12 MED ORDER — HEPARIN SODIUM (PORCINE) 1000 UNIT/ML IJ SOLN
INTRAMUSCULAR | Status: AC
  Filled 2019-04-12: qty 1

## 2019-04-12 MED ORDER — MIDAZOLAM HCL 2 MG/2ML IJ SOLN
INTRAMUSCULAR | Status: AC
  Filled 2019-04-12: qty 2

## 2019-04-12 MED ORDER — ASPIRIN 81 MG PO CHEW: 81.0000 mg | CHEWABLE_TABLET | ORAL | Status: DC

## 2019-04-12 MED ORDER — SODIUM CHLORIDE 0.9% FLUSH: 3.0000 mL | Freq: Two times a day (BID) | INTRAVENOUS | Status: DC

## 2019-04-12 MED ORDER — VERAPAMIL HCL 2.5 MG/ML IV SOLN
INTRAVENOUS | Status: AC
  Filled 2019-04-12: qty 2

## 2019-04-12 MED ORDER — SODIUM CHLORIDE 0.9 % IV SOLN: 250.0000 mL | INTRAVENOUS | Status: DC | PRN

## 2019-04-12 SURGICAL SUPPLY — 11 items
CATH INFINITI 5 FR JL3.5 (CATHETERS) ×3 IMPLANT
CATH INFINITI 5FR ANG PIGTAIL (CATHETERS) ×3 IMPLANT
CATH INFINITI JR4 5F (CATHETERS) ×3 IMPLANT
DEVICE RAD TR BAND REGULAR (VASCULAR PRODUCTS) ×3 IMPLANT
GLIDESHEATH SLEND SS 6F .021 (SHEATH) ×3 IMPLANT
KIT MANI 3VAL PERCEP (MISCELLANEOUS) ×3 IMPLANT
NEEDLE PERC 18GX7CM (NEEDLE) IMPLANT
PACK CARDIAC CATH (CUSTOM PROCEDURE TRAY) ×3 IMPLANT
SHEATH AVANTI 5FR X 11CM (SHEATH) IMPLANT
WIRE GUIDERIGHT .035X150 (WIRE) IMPLANT
WIRE ROSEN-J .035X260CM (WIRE) ×3 IMPLANT

## 2019-04-13 ENCOUNTER — Encounter: Payer: Self-pay | Admitting: Cardiology

## 2019-08-29 DIAGNOSIS — Z9189 Other specified personal risk factors, not elsewhere classified: Secondary | ICD-10-CM | POA: Insufficient documentation

## 2019-10-11 ENCOUNTER — Other Ambulatory Visit: Payer: Self-pay | Admitting: Physician Assistant

## 2019-10-11 ENCOUNTER — Ambulatory Visit
Admission: RE | Admit: 2019-10-11 | Discharge: 2019-10-11 | Disposition: A | Discharge status: Home / Self Care | Payer: Medicare Other | Source: Ambulatory Visit | Attending: Physician Assistant | Admitting: Physician Assistant

## 2019-10-11 ENCOUNTER — Other Ambulatory Visit: Payer: Self-pay

## 2019-10-11 DIAGNOSIS — M7989 Other specified soft tissue disorders: Secondary | ICD-10-CM | POA: Insufficient documentation

## 2019-12-19 ENCOUNTER — Other Ambulatory Visit: Payer: Self-pay | Admitting: Internal Medicine

## 2019-12-19 DIAGNOSIS — R4701 Aphasia: Secondary | ICD-10-CM

## 2019-12-19 DIAGNOSIS — M7989 Other specified soft tissue disorders: Secondary | ICD-10-CM

## 2019-12-21 ENCOUNTER — Ambulatory Visit
Admission: RE | Admit: 2019-12-21 | Discharge: 2019-12-21 | Disposition: A | Discharge status: Home / Self Care | Payer: Medicare Other | Source: Ambulatory Visit | Attending: Internal Medicine | Admitting: Internal Medicine

## 2019-12-21 ENCOUNTER — Other Ambulatory Visit: Payer: Self-pay

## 2019-12-21 DIAGNOSIS — M7989 Other specified soft tissue disorders: Secondary | ICD-10-CM | POA: Insufficient documentation

## 2019-12-21 DIAGNOSIS — R4701 Aphasia: Secondary | ICD-10-CM | POA: Diagnosis present

## 2020-01-01 ENCOUNTER — Other Ambulatory Visit: Payer: Self-pay | Admitting: Gastroenterology

## 2020-01-01 DIAGNOSIS — R1312 Dysphagia, oropharyngeal phase: Secondary | ICD-10-CM

## 2020-01-09 ENCOUNTER — Other Ambulatory Visit: Payer: Self-pay

## 2020-01-09 ENCOUNTER — Ambulatory Visit
Admission: RE | Admit: 2020-01-09 | Discharge: 2020-01-09 | Disposition: A | Discharge status: Home / Self Care | Payer: Medicare Other | Source: Ambulatory Visit | Attending: Gastroenterology | Admitting: Gastroenterology

## 2020-01-09 DIAGNOSIS — R1312 Dysphagia, oropharyngeal phase: Secondary | ICD-10-CM | POA: Diagnosis present

## 2020-01-11 ENCOUNTER — Other Ambulatory Visit: Payer: Self-pay | Admitting: Student

## 2020-01-11 ENCOUNTER — Other Ambulatory Visit (HOSPITAL_COMMUNITY): Payer: Self-pay | Admitting: Student

## 2020-01-11 DIAGNOSIS — M7752 Other enthesopathy of left foot: Secondary | ICD-10-CM

## 2020-01-11 DIAGNOSIS — M25572 Pain in left ankle and joints of left foot: Secondary | ICD-10-CM

## 2020-01-11 DIAGNOSIS — M25472 Effusion, left ankle: Secondary | ICD-10-CM

## 2020-01-12 ENCOUNTER — Other Ambulatory Visit: Payer: Self-pay | Admitting: Gastroenterology

## 2020-01-12 DIAGNOSIS — R1312 Dysphagia, oropharyngeal phase: Secondary | ICD-10-CM

## 2020-01-22 ENCOUNTER — Other Ambulatory Visit: Payer: Self-pay

## 2020-01-22 ENCOUNTER — Ambulatory Visit
Admission: RE | Admit: 2020-01-22 | Discharge: 2020-01-22 | Disposition: A | Discharge status: Home / Self Care | Payer: Medicare Other | Source: Ambulatory Visit | Attending: Gastroenterology | Admitting: Gastroenterology

## 2020-01-22 DIAGNOSIS — R1310 Dysphagia, unspecified: Secondary | ICD-10-CM | POA: Insufficient documentation

## 2020-01-22 NOTE — Therapy (Signed)
[place], [place], [address] Phone: [phone]   Fax:     Modified Barium Swallow  Patient Details  Name: Johnny Mccoy MRN: [id number] Date of Birth: [birth year] No data recorded  Encounter Date: 01/22/2020   End of Session - 01/22/20 1325    Visit Number 1    Number of Visits 1    Date for SLP Re-Evaluation 01/22/20    SLP Start Time 1245    SLP Stop Time  1325    SLP Time Calculation (min) 40 min    Activity Tolerance Patient tolerated treatment well           Past Medical History:  Diagnosis Date  . Anxiety    takes Lorazepam daily  . Arthritis   . Asthma    Albuterol as needed.Breo daily as well as Flovent  . CAD (coronary artery disease)   . Cataract   . Cataracts, bilateral    immature  . Chronic back pain    compression  . Complication of anesthesia    seizures after partial knee in 2014.Surgery was in am and an still in post op until 9 that night. Was sent to [facility] days d/t uncontrollable shaking,incoherant  . Depressed    takes Pristiq daily  . Essential tremor   . GERD (gastroesophageal reflux disease)    takes Dexilant daily as well as Zantac  . 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 History of bronchitis    Jan-Mar 2017  . History of hiatal hernia   . Hyperlipidemia    takes Atorvastatin daily  . Hypertension    takes HCTZ,Amlodipine,and Metoprolol daily  . Insomnia    takes Trazodone nightly   . Joint pain   . Muscle spasm    in neck.Takes Flexeril as needed  . Numbness    right knee,right hand,and toes on left  . Seizures ([place])    takes Lamotrigine daily. Last seizure 6+ yrs ago    Past Surgical History:  Procedure Laterality Date  . CARDIAC CATHETERIZATION     2015  . CARPAL TUNNEL RELEASE    . COLONOSCOPY    . CORONARY ANGIOPLASTY WITH STENT PLACEMENT     x 3  . dental implants    . ESOPHAGOGASTRODUODENOSCOPY    . EYE SURGERY  Bilateral   . EYE SURGERY     both eyes in 60's d/t muscle causing to squint  . HERNIA REPAIR     umbilical  . JOINT REPLACEMENT Right    knee  . KNEE ARTHROSCOPY    . LEFT HEART CATH AND CORONARY ANGIOGRAPHY Left 04/12/2019   Procedure: LEFT HEART CATH AND CORONARY ANGIOGRAPHY;  Surgeon: Corey Skains, MD;  Location: [place] [facility];  Service: Cardiovascular;  Laterality: Left;  Marland Kitchen MEDIAL PARTIAL KNEE REPLACEMENT Right   . MEDIASTERNOTOMY N/A 10/28/2015   Procedure: PARTIAL STERNOTOMY;  Surgeon: [name], MD;  Location: [place];  Service: Thoracic;  Laterality: N/A;  . numerous hand surgery    . 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   Procedure: RESECTION OF anterier MEDIASTINAL TUMOR;  Surgeon: [name], MD;  Location: [place];  Service: Thoracic;  Laterality: N/A;  . right hand fusion    . 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 TONSILLECTOMY      There were no vitals filed for this visit.   Subjective:  Patient behavior: (alertness, ability to follow instructions, etc.): The patient is able to verbalize his swallowing complaints and follow directions.  Chief complaint: Frequent coughing during meals, recent barium swallow study positive for laryngeal penetration   Objective:  Radiological Procedure: A videoflouroscopic evaluation of oral-preparatory, reflex initiation, and pharyngeal phases of the swallow was performed; as well as a screening of the upper esophageal phase.  I. POSTURE: Upright in MBS chair  II. VIEW: Lateral  III. COMPENSATORY STRATEGIES: N/A  IV. BOLUSES ADMINISTERED:   Thin Liquid: 1 small, 4 rapid consecutive gulps   Nectar-thick Liquid: 1 moderate   Honey-thick Liquid: DNT   Puree: 1 teaspoon, 1 heaping teaspoon   Mechanical Soft: 1/4 graham cracker in applesauce  V. RESULTS OF EVALUATION: A. ORAL PREPARATORY PHASE: (The lips, tongue, and velum are observed for strength and coordination)        **Overall Severity Rating: Within normal limits  B. SWALLOW INITIATION/REFLEX: (The reflex is normal if "triggered" by the time the bolus reached the base of the tongue)  **Overall Severity Rating: Within normal limits  C. PHARYNGEAL PHASE: (Pharyngeal function is normal if the bolus shows rapid, smooth, and continuous transit through the pharynx and there is no pharyngeal residue after the swallow)  **Overall Severity Rating: Within normal limits  D. LARYNGEAL PENETRATION: (Material entering into the laryngeal inlet/vestibule but not aspirated) None  E. ASPIRATION: None  F. ESOPHAGEAL PHASE: (Screening of the upper esophagus) No abnormality within the viewable cervical esophagus  ASSESSMENT: This 74 year old man; with frequent coughing while eating/drinking and recent barium swallow study positive for laryngeal penetration; is presenting with functional oropharyngeal swallowing.  Oral control of the bolus including oral hold, rotary mastication, and anterior to posterior transfer is within functional limits.   Timing of pharyngeal swallow initiation is within normal limits.  Aspects of the pharyngeal stage of swallowing including tongue base retraction, hyolaryngeal excursion, epiglottic inversion, and duration/amplitude of UES opening are within normal limits.  There is no observed pharyngeal residue, laryngeal penetration, or tracheal aspiration.  Coughing was not reproduced during this study and does not appear to be due to aspiration.  The patient's description of coughing episodes is suggestive of a heightened cough reflex sensitivity; he may benefit from evaluation for chronic cough.  PLAN/RECOMMENDATIONS:   A. Diet: regular   B. Swallowing Precautions: no special precautions need for oropharyngeal swallowing   C. Recommended consultation to: work-up for chronic cough   D. Therapy recommendations: speech therapy is not indicated for swallowing   E. Results and recommendations were  discussed with the patient immediately following the study and the final report routed to the referring MD.   Dysphagia, unspecified type - Plan: DG SWALLOW FUNC OP MEDICARE SPEECH PATH, DG SWALLOW FUNC OP MEDICARE SPEECH PATH        Problem List Patient Active Problem List   Diagnosis Date Noted  . Angina pectoris ([place]) 03/29/2019  . Diastasis of rectus abdominis 03/18/2018  . Lymphedema 12/16/2015  . Chronic venous insufficiency 12/16/2015  . Leg swelling 12/16/2015  . 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 Anxiety 05/23/2015  . Acid reflux 05/23/2015  . HBV (hepatitis B virus) infection 05/23/2015  . History of cardiac catheterization 05/23/2015  . Combined fat and carbohydrate induced hyperlipemia 05/23/2015  . Arteriosclerosis of coronary artery 05/23/2015  . Clinical depression 05/23/2015  . Cough 07/26/2014  . Benign essential HTN 07/16/2014  . Breathlessness on exertion 02/12/2014  . 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  Bundle branch block, right 05/29/2012  . History of knee surgery 05/26/2012   Leroy Sea, MS/CCC- SLP  Lou Miner 01/22/2020, 1:26 PM  [facility] [place], [place], [address] Phone: [phone]   Fax:     Name: Johnny Mccoy MRN: [id number] Date of Birth: [birth year]

## 2020-01-23 ENCOUNTER — Ambulatory Visit
Admission: RE | Admit: 2020-01-23 | Discharge: 2020-01-23 | Disposition: A | Discharge status: Home / Self Care | Payer: Medicare Other | Source: Ambulatory Visit | Attending: Student | Admitting: Student

## 2020-01-23 ENCOUNTER — Other Ambulatory Visit: Payer: Self-pay

## 2020-01-23 DIAGNOSIS — M25572 Pain in left ankle and joints of left foot: Secondary | ICD-10-CM

## 2020-01-23 DIAGNOSIS — M7752 Other enthesopathy of left foot: Secondary | ICD-10-CM

## 2020-01-23 DIAGNOSIS — M25472 Effusion, left ankle: Secondary | ICD-10-CM | POA: Diagnosis not present

## 2020-02-16 ENCOUNTER — Other Ambulatory Visit: Payer: Self-pay | Admitting: Podiatry

## 2020-02-26 ENCOUNTER — Encounter: Payer: Self-pay | Admitting: Podiatry

## 2020-02-26 ENCOUNTER — Other Ambulatory Visit: Payer: Self-pay

## 2020-03-05 ENCOUNTER — Other Ambulatory Visit: Payer: Self-pay

## 2020-03-05 ENCOUNTER — Other Ambulatory Visit
Admission: RE | Admit: 2020-03-05 | Discharge: 2020-03-05 | Disposition: A | Discharge status: Home / Self Care | Payer: Medicare Other | Source: Ambulatory Visit | Attending: Podiatry | Admitting: Podiatry

## 2020-03-05 DIAGNOSIS — Z20822 Contact with and (suspected) exposure to covid-19: Secondary | ICD-10-CM | POA: Diagnosis not present

## 2020-03-05 DIAGNOSIS — Z01812 Encounter for preprocedural laboratory examination: Secondary | ICD-10-CM | POA: Diagnosis present

## 2020-03-05 LAB — SARS CORONAVIRUS 2 (TAT 6-24 HRS): SARS Coronavirus 2: NEGATIVE

## 2020-03-07 ENCOUNTER — Encounter: Payer: Self-pay | Admitting: Podiatry

## 2020-03-07 ENCOUNTER — Ambulatory Visit
Admission: RE | Admit: 2020-03-07 | Discharge: 2020-03-07 | Disposition: A | Discharge status: Home / Self Care | Payer: Medicare Other | Attending: Podiatry | Admitting: Podiatry

## 2020-03-07 ENCOUNTER — Other Ambulatory Visit: Payer: Self-pay

## 2020-03-07 ENCOUNTER — Ambulatory Visit: Payer: Medicare Other | Admitting: Anesthesiology

## 2020-03-07 ENCOUNTER — Encounter
Admission: RE | Disposition: A | Discharge status: Home / Self Care | Payer: Self-pay | Source: Home / Self Care | Attending: Podiatry

## 2020-03-07 DIAGNOSIS — Z79899 Other long term (current) drug therapy: Secondary | ICD-10-CM | POA: Insufficient documentation

## 2020-03-07 DIAGNOSIS — M25872 Other specified joint disorders, left ankle and foot: Secondary | ICD-10-CM | POA: Insufficient documentation

## 2020-03-07 DIAGNOSIS — Z7982 Long term (current) use of aspirin: Secondary | ICD-10-CM | POA: Insufficient documentation

## 2020-03-07 DIAGNOSIS — X58XXXA Exposure to other specified factors, initial encounter: Secondary | ICD-10-CM | POA: Insufficient documentation

## 2020-03-07 DIAGNOSIS — Z7951 Long term (current) use of inhaled steroids: Secondary | ICD-10-CM | POA: Insufficient documentation

## 2020-03-07 DIAGNOSIS — S86312A Strain of muscle(s) and tendon(s) of peroneal muscle group at lower leg level, left leg, initial encounter: Secondary | ICD-10-CM | POA: Insufficient documentation

## 2020-03-07 DIAGNOSIS — Z8616 Personal history of COVID-19: Secondary | ICD-10-CM | POA: Insufficient documentation

## 2020-03-07 DIAGNOSIS — Z09 Encounter for follow-up examination after completed treatment for conditions other than malignant neoplasm: Secondary | ICD-10-CM

## 2020-03-07 DIAGNOSIS — Z96651 Presence of right artificial knee joint: Secondary | ICD-10-CM | POA: Insufficient documentation

## 2020-03-07 DIAGNOSIS — M65872 Other synovitis and tenosynovitis, left ankle and foot: Secondary | ICD-10-CM | POA: Insufficient documentation

## 2020-03-07 HISTORY — DX: Sleep apnea, unspecified: G47.30

## 2020-03-07 HISTORY — PX: TENDON REPAIR: SHX5111

## 2020-03-07 HISTORY — PX: ORIF ANKLE FRACTURE: SHX5408

## 2020-03-07 HISTORY — PX: ANKLE ARTHROSCOPY: SHX545

## 2020-03-07 HISTORY — DX: Acute embolism and thrombosis of unspecified vein: I82.90

## 2020-03-07 SURGERY — ARTHROSCOPY, ANKLE
Anesthesia: General | Site: Ankle | Laterality: Left

## 2020-03-07 MED ORDER — PHENYLEPHRINE HCL (PRESSORS) 10 MG/ML IV SOLN
INTRAVENOUS | Status: DC | PRN
  Administered 2020-03-07 (×2): 100 ug via INTRAVENOUS
  Administered 2020-03-07: 200 ug via INTRAVENOUS
  Administered 2020-03-07: 50 ug via INTRAVENOUS
  Administered 2020-03-07 (×2): 200 ug via INTRAVENOUS
  Administered 2020-03-07 (×8): 100 ug via INTRAVENOUS

## 2020-03-07 MED ORDER — APIXABAN 2.5 MG PO TABS: 2.5000 mg | ORAL_TABLET | Freq: Two times a day (BID) | ORAL | 1 refills | Status: DC

## 2020-03-07 MED ORDER — ACETAMINOPHEN 325 MG PO TABS: 325.0000 mg | ORAL_TABLET | ORAL | Status: DC | PRN

## 2020-03-07 MED ORDER — ACETAMINOPHEN 160 MG/5ML PO SOLN: 325.0000 mg | ORAL | Status: DC | PRN

## 2020-03-07 MED ORDER — BUPIVACAINE LIPOSOME 1.3 % IJ SUSP
INTRAMUSCULAR | Status: DC | PRN
  Administered 2020-03-07: 20 mL

## 2020-03-07 MED ORDER — DEXAMETHASONE SODIUM PHOSPHATE 4 MG/ML IJ SOLN
INTRAMUSCULAR | Status: DC | PRN
  Administered 2020-03-07: 4 mg via INTRAVENOUS

## 2020-03-07 MED ORDER — PROPOFOL 10 MG/ML IV BOLUS
INTRAVENOUS | Status: DC | PRN
  Administered 2020-03-07: 150 mg via INTRAVENOUS

## 2020-03-07 MED ORDER — CEPHALEXIN 500 MG PO CAPS: 500.0000 mg | ORAL_CAPSULE | Freq: Three times a day (TID) | ORAL | 0 refills | Status: AC

## 2020-03-07 MED ORDER — LIDOCAINE HCL (CARDIAC) PF 100 MG/5ML IV SOSY
PREFILLED_SYRINGE | INTRAVENOUS | Status: DC | PRN
  Administered 2020-03-07: 20 mg via INTRATRACHEAL

## 2020-03-07 MED ORDER — LACTATED RINGERS IV SOLN: INTRAVENOUS | Status: DC

## 2020-03-07 MED ORDER — OXYCODONE HCL 5 MG PO TABS: 5.0000 mg | ORAL_TABLET | Freq: Once | ORAL | Status: DC | PRN

## 2020-03-07 MED ORDER — BUPIVACAINE HCL 0.5 % IJ SOLN
INTRAMUSCULAR | Status: DC | PRN
  Administered 2020-03-07: 20 mL

## 2020-03-07 MED ORDER — OXYCODONE-ACETAMINOPHEN 7.5-325 MG PO TABS
1.0000 | ORAL_TABLET | Freq: Four times a day (QID) | ORAL | 0 refills | Status: AC | PRN
Start: 2020-03-07 — End: 2020-03-14

## 2020-03-07 MED ORDER — CEFAZOLIN SODIUM-DEXTROSE 2-4 GM/100ML-% IV SOLN
2.0000 g | INTRAVENOUS | Status: AC
  Administered 2020-03-07: 2 g via INTRAVENOUS

## 2020-03-07 MED ORDER — OXYCODONE HCL 5 MG/5ML PO SOLN: 5.0000 mg | Freq: Once | ORAL | Status: DC | PRN

## 2020-03-07 MED ORDER — ONDANSETRON HCL 4 MG/2ML IJ SOLN: 4.0000 mg | Freq: Once | INTRAMUSCULAR | Status: DC | PRN

## 2020-03-07 MED ORDER — MIDAZOLAM HCL 5 MG/5ML IJ SOLN
INTRAMUSCULAR | Status: DC | PRN
  Administered 2020-03-07: 2 mg via INTRAVENOUS

## 2020-03-07 MED ORDER — FENTANYL CITRATE (PF) 100 MCG/2ML IJ SOLN
INTRAMUSCULAR | Status: DC | PRN
  Administered 2020-03-07: 50 ug via INTRAVENOUS

## 2020-03-07 MED ORDER — POVIDONE-IODINE 7.5 % EX SOLN: Freq: Once | CUTANEOUS | Status: DC

## 2020-03-07 MED ORDER — ONDANSETRON HCL 4 MG/2ML IJ SOLN
INTRAMUSCULAR | Status: DC | PRN
  Administered 2020-03-07: 4 mg via INTRAVENOUS

## 2020-03-07 SURGICAL SUPPLY — 65 items
BAG SNAP BAND KOVER 36X36 (MISCELLANEOUS) ×2 IMPLANT
BNDG CMPR STD VLCR NS LF 5.8X4 (GAUZE/BANDAGES/DRESSINGS) ×2
BNDG CMPR STD VLCR NS LF 5.8X6 (GAUZE/BANDAGES/DRESSINGS) ×1
BNDG COHESIVE 4X5 TAN STRL (GAUZE/BANDAGES/DRESSINGS) ×2 IMPLANT
BNDG ELASTIC 4X5.8 VLCR NS LF (GAUZE/BANDAGES/DRESSINGS) ×4 IMPLANT
BNDG ELASTIC 4X5.8 VLCR STR LF (GAUZE/BANDAGES/DRESSINGS) ×1 IMPLANT
BNDG ELASTIC 6X5.8 VLCR NS LF (GAUZE/BANDAGES/DRESSINGS) ×2 IMPLANT
BNDG ESMARK 4X12 TAN STRL LF (GAUZE/BANDAGES/DRESSINGS) ×2 IMPLANT
BNDG GAUZE 4.5X4.1 6PLY STRL (MISCELLANEOUS) ×2 IMPLANT
BUR AGGRESSIVE+ 2.5 (BURR) ×2 IMPLANT
COVER LIGHT HANDLE UNIVERSAL (MISCELLANEOUS) ×4 IMPLANT
CUFF TOURN SGL QUICK 30 (TOURNIQUET CUFF) ×2
CUFF TRNQT CYL 30X4X21-28X (TOURNIQUET CUFF) IMPLANT
DRAPE C-ARMOR (DRAPES) ×2 IMPLANT
DRAPE STERI 35X30 U-POUCH (DRAPES) ×2 IMPLANT
DURAPREP 26ML APPLICATOR (WOUND CARE) ×3 IMPLANT
ELECT REM PT RETURN 9FT ADLT (ELECTROSURGICAL) ×2
ELECTRODE REM PT RTRN 9FT ADLT (ELECTROSURGICAL) ×1 IMPLANT
ETHIBOND 2 0 GREEN CT 2 30IN (SUTURE) ×1 IMPLANT
GAUZE SPONGE 4X4 12PLY STRL (GAUZE/BANDAGES/DRESSINGS) ×2 IMPLANT
GAUZE XEROFORM 1X8 LF (GAUZE/BANDAGES/DRESSINGS) ×2 IMPLANT
GLOVE BIO SURGEON STRL SZ7 (GLOVE) ×2 IMPLANT
GLOVE BIOGEL PI IND STRL 7.0 (GLOVE) ×1 IMPLANT
GLOVE BIOGEL PI INDICATOR 7.0 (GLOVE) ×1
GOWN STRL REUS W/ TWL LRG LVL3 (GOWN DISPOSABLE) ×2 IMPLANT
GOWN STRL REUS W/TWL LRG LVL3 (GOWN DISPOSABLE) ×4
GRAFT DMB PUTTY 1 OPTIUM FD (Bone Implant) IMPLANT
IMPLANT SCP KIT FOOT ANKLE 3 (Orthopedic Implant) ×2 IMPLANT
IV LACTATED RINGER IRRG 3000ML (IV SOLUTION) ×8
IV LR IRRIG 3000ML ARTHROMATIC (IV SOLUTION) ×4 IMPLANT
IV NS 500ML (IV SOLUTION) ×2
IV NS 500ML BAXH (IV SOLUTION) IMPLANT
KIT ACCUFILL 3CC (Orthopedic Implant) IMPLANT
KIT TURNOVER KIT A (KITS) ×2 IMPLANT
MANIFOLD 4PT FOR NEPTUNE1 (MISCELLANEOUS) ×2 IMPLANT
NDL SPNL 18GX3.5 QUINCKE PK (NEEDLE) ×1 IMPLANT
NEEDLE SPNL 18GX3.5 QUINCKE PK (NEEDLE) ×2 IMPLANT
PACK EXTREMITY ARMC (MISCELLANEOUS) ×2 IMPLANT
PADDING CAST BLEND 4X4 NS (MISCELLANEOUS) ×8 IMPLANT
PENCIL SMOKE EVACUATOR (MISCELLANEOUS) ×2 IMPLANT
PUTTY DBM OPTIUM 1CC (Bone Implant) ×2 IMPLANT
SET TUBE SUCT SHAVER OUTFL 24K (TUBING) ×1 IMPLANT
SPLINT CAST 1 STEP 4X30 (MISCELLANEOUS) ×2 IMPLANT
SPONGE LAP 18X18 RF (DISPOSABLE) ×2 IMPLANT
STOCKINETTE IMPERVIOUS LG (DRAPES) ×2 IMPLANT
STOCKINETTE TUB 6IN (MISCELLANEOUS) ×2 IMPLANT
STRAP ANKLE DISTRACTOR (MISCELLANEOUS) ×1 IMPLANT
STRAP BODY AND KNEE 60X3 (MISCELLANEOUS) ×2 IMPLANT
SUT ETHILON 3-0 (SUTURE) ×1 IMPLANT
SUT ETHILON 3-0 FS-10 30 BLK (SUTURE) ×2
SUT ETHILON 4-0 (SUTURE) ×2
SUT ETHILON 4-0 FS2 18XMFL BLK (SUTURE) ×1
SUT MNCRL 4-0 (SUTURE) ×2
SUT MNCRL 4-0 27XMFL (SUTURE) ×1
SUT VIC AB 2-0 SH 27 (SUTURE) ×2
SUT VIC AB 2-0 SH 27XBRD (SUTURE) IMPLANT
SUT VIC AB 3-0 SH 27 (SUTURE) ×4
SUT VIC AB 3-0 SH 27X BRD (SUTURE) IMPLANT
SUTURE EHLN 3-0 FS-10 30 BLK (SUTURE) IMPLANT
SUTURE ETHLN 4-0 FS2 18XMF BLK (SUTURE) IMPLANT
SUTURE MNCRL 4-0 27XMF (SUTURE) IMPLANT
SYR 10ML LL (SYRINGE) ×2 IMPLANT
SYR BULB IRRIG 60ML STRL (SYRINGE) ×2 IMPLANT
TUBING ARTHRO INFLOW-ONLY STRL (TUBING) ×2 IMPLANT
WAND TOPAZ MICRO DEBRIDER (MISCELLANEOUS) ×2 IMPLANT

## 2020-03-07 NOTE — Anesthesia Procedure Notes (Signed)
Procedure Name: LMA Insertion Date/Time: 03/07/2020 7:49 AM Performed by: Maree Krabbe, CRNA Pre-anesthesia Checklist: Patient identified, Emergency Drugs available, Suction available, Timeout performed and Patient being monitored Patient Re-evaluated:Patient Re-evaluated prior to induction Oxygen Delivery Method: Circle system utilized Preoxygenation: Pre-oxygenation with 100% oxygen Induction Type: IV induction LMA: LMA inserted LMA Size: 4.0 Number of attempts: 1 Placement Confirmation: positive ETCO2 and breath sounds checked- equal and bilateral Tube secured with: Tape Dental Injury: Teeth and Oropharynx as per pre-operative assessment

## 2020-03-07 NOTE — Anesthesia Postprocedure Evaluation (Signed)
Anesthesia Post Note  Patient: Johnny Mccoy  Procedure(s) Performed: ANKLE ARTHROSCOPY (Left Ankle) OPEN REDUCTION INTERNAL FIXATION (ORIF) ANKLE FRACTURE (Left Ankle) FLEXOR TENDON REPAIR (Left Ankle)     Patient location during evaluation: PACU Anesthesia Type: General Level of consciousness: awake and alert and oriented Pain management: satisfactory to patient Vital Signs Assessment: post-procedure vital signs reviewed and stable Respiratory status: spontaneous breathing, nonlabored ventilation and respiratory function stable Cardiovascular status: blood pressure returned to baseline and stable Postop Assessment: Adequate PO intake and No signs of nausea or vomiting Anesthetic complications: no   No complications documented.  Cherly Beach

## 2020-03-07 NOTE — Anesthesia Preprocedure Evaluation (Addendum)
Anesthesia Evaluation  Patient identified by MRN, date of birth, ID band Patient awake    Reviewed: Allergy & Precautions, H&P , NPO status , Patient's Chart, lab work & pertinent test results  Airway Mallampati: III  TM Distance: >3 FB Neck ROM: full    Dental no notable dental hx.    Pulmonary asthma , sleep apnea and Continuous Positive Airway Pressure Ventilation ,    Pulmonary exam normal breath sounds clear to auscultation       Cardiovascular hypertension, + angina + CAD  + dysrhythmias + Valvular Problems/Murmurs AS  Rhythm:regular Rate:Normal + Systolic murmurs    Neuro/Psych Seizures -,  PSYCHIATRIC DISORDERS CVA    GI/Hepatic GERD  ,  Endo/Other    Renal/GU      Musculoskeletal   Abdominal   Peds  Hematology   Anesthesia Other Findings   Reproductive/Obstetrics                            Anesthesia Physical Anesthesia Plan  ASA: III  Anesthesia Plan: General LMA   Post-op Pain Management:  Regional for Post-op pain   Induction:   PONV Risk Score and Plan: 1 and Treatment may vary due to age or medical condition, Ondansetron, Dexamethasone and Midazolam  Airway Management Planned:   Additional Equipment:   Intra-op Plan:   Post-operative Plan:   Informed Consent: I have reviewed the patients History and Physical, chart, labs and discussed the procedure including the risks, benefits and alternatives for the proposed anesthesia with the patient or authorized representative who has indicated his/her understanding and acceptance.     Dental Advisory Given  Plan Discussed with: CRNA  Anesthesia Plan Comments:         Anesthesia Quick Evaluation

## 2020-03-07 NOTE — Anesthesia Procedure Notes (Signed)
Anesthesia Regional Block: Popliteal block   Pre-Anesthetic Checklist: ,, timeout performed, Correct Patient, Correct Site, Correct Laterality, Correct Procedure, Correct Position, site marked, Risks and benefits discussed,  Surgical consent,  Pre-op evaluation,  At surgeon's request and post-op pain management  Laterality: Left  Prep: chloraprep       Needles:  Injection technique: Single-shot  Needle Type: Echogenic Needle     Needle Length: 9cm  Needle Gauge: 21     Additional Needles:   Procedures:,,,, ultrasound used (permanent image in chart),,,,  Narrative:  Start time: 03/07/2020 7:27 AM End time: 03/07/2020 7:34 AM Injection made incrementally with aspirations every 5 mL.  Performed by: Personally  Anesthesiologist: Ranee Gosselin, MD  Additional Notes: Functioning IV was confirmed and monitors applied. Ultrasound guidance: relevant anatomy identified, needle position confirmed, local anesthetic spread visualized around nerve(s)., vascular puncture avoided.  Image printed for medical record.  Negative aspiration and no paresthesias; incremental administration of local anesthetic. The patient tolerated the procedure well. Vitals signes recorded in RN notes.  76ml

## 2020-03-07 NOTE — H&P (Signed)
HISTORY AND PHYSICAL INTERVAL NOTE:  03/07/2020  7:19 AM  Johnny Mccoy  has presented today for surgery, with the diagnosis of S86.312A  PERONEAL TENDON TEAR LEFT S82.832A OTHER CLOSED FRACTURE DISTAL END LEFT FIBULA M89.9, M94.9  OSTEOCHONDRAL LESION TALAR DOME.  The various methods of treatment have been discussed with the patient.  No guarantees were given.  After consideration of risks, benefits and other options for treatment, the patient has consented to surgery.  I have reviewed the patients' chart and labs.    PROCEDURE: 1. LEFT ANKLE ARTHROSCOPY WITH EXTENSIVE DEBRIDEMENT AND POSSIBLE OCD REPAIR (MICROFRACTURING) 2. LEFT PERONEAL TENDON REPAIR. DEBRIDEMENT, AND POSSIBLE ANASTOMOSIS 3. SUBCHONDROPLASTY LEFT FIBULA BONE CYST AND MARROW EDEMA (ORIF FIBULA)   A history and physical examination was performed in my office.  The patient was reexamined.  There have been no changes to this history and physical examination.  Rosetta Posner, DPM

## 2020-03-07 NOTE — Discharge Instructions (Signed)
[facility]  POST OPERATIVE INSTRUCTIONS FOR DR. TROXLER, DR. Ether Griffins, AND DR. Fate Caster [facility]   1. Take your medication as prescribed.  Pain medication should be taken only as needed.  You may take ibuprofen or Tylenol between doses.  2. Begin taking Eliquis medication tomorrow afternoon. 3. Take antibiotic as prescribed until gone.  4. Keep the dressing clean, dry and intact.  5. Keep your foot elevated above the heart level for the first 48 hours.  6. Walking to the bathroom and brief periods of walking are acceptable, unless we have instructed you to be non-weight bearing.  7. Recommend usage of crutches, knee scooter, wheelchair or other device to keep nonweightbearing on your left lower extremity at all times.  8. Do not take a shower. Baths are permissible as long as the foot is kept out of the water.   9. Every hour you are awake:  - Bend your knee 15 times. - Massage calf 15 times  10. Call [facility] [phone]) if any of the following problems occur: - You develop a temperature or fever. - The bandage becomes saturated with blood. - Medication does not stop your pain. - Injury of the foot occurs. - Any symptoms of infection including redness, odor, or red streaks running from wound.   General Anesthesia, Adult, Care After This sheet gives you information about how to care for yourself after your procedure. Your health care provider may also give you more specific instructions. If you have problems or questions, contact your health care provider. What can I expect after the procedure? After the procedure, the following side effects are common:  Pain or discomfort at the IV site.  Nausea.  Vomiting.  Sore throat.  Trouble concentrating.  Feeling cold or chills.  Weak or tired.  Sleepiness and fatigue.  Soreness and body aches. These side effects can affect parts of the body that  were not involved in surgery. Follow these instructions at home:  For at least 24 hours after the procedure:  Have a responsible adult stay with you. It is important to have someone help care for you until you are awake and alert.  Rest as needed.  Do not: ? Participate in activities in which you could fall or become injured. ? Drive. ? Use heavy machinery. ? Drink alcohol. ? Take sleeping pills or medicines that cause drowsiness. ? Make important decisions or sign legal documents. ? Take care of children on your own. Eating and drinking  Follow any instructions from your health care provider about eating or drinking restrictions.  When you feel hungry, start by eating small amounts of foods that are soft and easy to digest (bland), such as toast. Gradually return to your regular diet.  Drink enough fluid to keep your urine pale yellow.  If you vomit, rehydrate by drinking water, juice, or clear broth. General instructions  If you have sleep apnea, surgery and certain medicines can increase your risk for breathing problems. Follow instructions from your health care provider about wearing your sleep device: ? Anytime you are sleeping, including during daytime naps. ? While taking prescription pain medicines, sleeping medicines, or medicines that make you drowsy.  Return to your normal activities as told by your health care provider. Ask your health care provider what activities are safe for you.  Take over-the-counter and prescription medicines only as told by your health care provider.  If you smoke, do not smoke without supervision.  Keep all follow-up visits as told by your health care provider. This is important. Contact a health care provider if:  You have nausea or vomiting that does not get better with medicine.  You cannot eat or drink without vomiting.  You have pain that does not get better with medicine.  You are unable to pass urine.  You develop a skin  rash.  You have a fever.  You have redness around your IV site that gets worse. Get help right away if:  You have difficulty breathing.  You have chest pain.  You have blood in your urine or stool, or you vomit blood. Summary  After the procedure, it is common to have a sore throat or nausea. It is also common to feel tired.  Have a responsible adult stay with you for the first 24 hours after general anesthesia. It is important to have someone help care for you until you are awake and alert.  When you feel hungry, start by eating small amounts of foods that are soft and easy to digest (bland), such as toast. Gradually return to your regular diet.  Drink enough fluid to keep your urine pale yellow.  Return to your normal activities as told by your health care provider. Ask your health care provider what activities are safe for you. This information is not intended to replace advice given to you by your health care provider. Make sure you discuss any questions you have with your health care provider. Document Revised: 02/19/2017 Document Reviewed: 10/02/2016 Elsevier Patient Education  2020 [employer].

## 2020-03-07 NOTE — Anesthesia Procedure Notes (Signed)
Anesthesia Regional Block: Adductor canal block   Pre-Anesthetic Checklist: ,, timeout performed, Correct Patient, Correct Site, Correct Laterality, Correct Procedure, Correct Position, site marked, Risks and benefits discussed,  Surgical consent,  Pre-op evaluation,  At surgeon's request and post-op pain management  Laterality: Left  Prep: chloraprep       Needles:  Injection technique: Single-shot  Needle Type: Echogenic Needle     Needle Length: 9cm  Needle Gauge: 21     Additional Needles:   Procedures:,,,, ultrasound used (permanent image in chart),,,,  Narrative:  Start time: 03/07/2020 7:27 AM End time: 03/07/2020 7:34 AM Injection made incrementally with aspirations every 5 mL.  Performed by: Personally  Anesthesiologist: Ranee Gosselin, MD  Additional Notes: Functioning IV was confirmed and monitors applied. Ultrasound guidance: relevant anatomy identified, needle position confirmed, local anesthetic spread visualized around nerve(s)., vascular puncture avoided.  Image printed for medical record.  Negative aspiration and no paresthesias; incremental administration of local anesthetic. The patient tolerated the procedure well. Vitals signes recorded in RN notes.  26ml

## 2020-03-07 NOTE — Transfer of Care (Signed)
Immediate Anesthesia Transfer of Care Note  Patient: Johnny Mccoy  Procedure(s) Performed: ANKLE ARTHROSCOPY (Left Ankle) OPEN REDUCTION INTERNAL FIXATION (ORIF) ANKLE FRACTURE (Left Ankle) FLEXOR TENDON REPAIR (Left Ankle)  Patient Location: PACU  Anesthesia Type: General LMA  Level of Consciousness: awake, alert  and patient cooperative  Airway and Oxygen Therapy: Patient Spontanous Breathing and Patient connected to supplemental oxygen  Post-op Assessment: Post-op Vital signs reviewed, Patient's Cardiovascular Status Stable, Respiratory Function Stable, Patent Airway and No signs of Nausea or vomiting  Post-op Vital Signs: Reviewed and stable  Complications: No complications documented.

## 2020-03-07 NOTE — Op Note (Signed)
PODIATRY / FOOT AND ANKLE SURGERY OPERATIVE REPORT    SURGEON: Caroline More, DPM  PRE-OPERATIVE DIAGNOSIS:  1.  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 2.  Peroneus longus tendon tear with near complete rupture left 3.  Left peroneus brevis and longus tenosynovitis 4.  Reactive marrow edema left fibula with cystic posterior distal medial malleolus  POST-OPERATIVE DIAGNOSIS: Same  PROCEDURE(S): 1. Left ankle arthroscopy with extensive debridement 2. Left ankle osteochondral defect microfracture repair talus, 5 x 5 mm 3. Left peroneus brevis and peroneus longus tenosynovectomy's 4. Left peroneus longus to brevis tendon transfer with tendon repair 5. Left subchondroplasty distal fibula and lateral malleolus with application of demineralized bone matrix  HEMOSTASIS: Left thigh tourniquet  ANESTHESIA: general, preoperative popliteal block performed by anesthesia  ESTIMATED BLOOD LOSS: 30 cc  FINDING(S): 1.  Osteochondral defect at the lateral shoulder of the talus which measure approximately 5 mm by around 5 mm, mild arthritic changes seen within the ankle joint with mild to moderate synovitis 2.  Substantial peroneus longus tendon tear about the lateral malleolus, almost complete rupture, large amount of synovitis around the peroneus longus and brevis tendons 3.  Cystic change to the posterior aspect of the distal tip of the lateral malleolus  PATHOLOGY/SPECIMEN(S): None  INDICATIONS:   Johnny Mccoy is a 75 y.o. male who presents with chronic pain to the left lateral ankle.  Patient does not note any specific event that caused it but has been having lateral ankle pain for prolonged periods of time now.  Patient was seen by orthopedics and a MRI was performed eventually due to his lateral ankle pain showing a significant tear of the peroneus longus tendon with tenosynovitis of the peroneus brevis and  longus tendons as well as marrow edema to the distal fibula and a cystic change to the posterior lateral aspect of the tip lateral malleolus.  The MRI also showed a small osteochondral defect which measured around 5 x 5 mm preoperatively and an area of cartilaginous erosion.  Patient had been treated with conservative care of a walking boot and bracing as well as physical therapy type techniques.  Discussed all treatment options with the patient both conservative and surgical attempts at correction including potential risks and complications as patient has elected for procedure that was described above.  DESCRIPTION: After obtaining full informed written consent, the patient was brought back to the operating room and placed supine upon the operating table.  A preoperative popliteal block was performed by anesthesia to the left lower extremity.  The patient received IV antibiotics prior to induction.  A pneumatic thigh tourniquet was placed about the left thigh.  After obtaining adequate anesthesia, the patient was prepped and draped in the standard fashion.  Attention was then directed to the anterior aspect of the left ankle where the ankle arthroscopy distractor kit was applied.  The medial portal at the anterior medial aspect of the ankle was then identified medial to the tendon of the tibialis anterior.  Approximately 10 cc of normal sterile saline was injected through this portal into the ankle joint.  At this time the Esmarch bandage used to exsanguinate the right lower extremity pneumatic thigh tourniquet was inflated.  The ankle distractor was then placed and the ankle was distracted.  A small stab incision was made in the area of the injection at the anterior medial portal and blunt dissection was continued down  to the ankle joint capsule which was punctured with a hemostat and fluid was released indicating successful entry to the ankle joint.  The obturator and cannula was then placed and the cannula  was left in place once the obturator was removed and the scope was then placed through the anteromedial portal.  Transillumination was then performed at the anterior lateral aspect of the ankle and a small percutaneous stab incision was made and blunt dissection was continued down to the ankle joint capsule which was punctured with a hemostat and liquid was released indicating successful entry into the ankle joint.  The shaver was then placed to the anterior lateral portal.  Debridement was performed of all synovitic tissue to the anterior lateral and lateral aspect of the ankle joint.  Photos were taken during that time showing synovitic changes.  The osteochondral defect was then identified at the lateral aspect of the talar dome at the central portion along the shoulder.  This area was probed and noted to be soft.  A curette was used to remove some of the diseased cartilage to this area to subchondral bone.  The shaver was then used to remove some of the cartilaginous debris was removed.  The osteochondral defect itself after debridement measured approximately 5 x 5 mm as was seen on the MRI.  At this time the sharp angled pick was then placed at the area of the osteochondral defect in approximately 2 holes were punctured in the area for microfracturing.  This completed the procedure for this area.  The instrumentation was then switched in the medial ankle joint was also debrided further but minimal synovitis was noted.  The fluid was then withdrawn with use of the scope and shaver and instrumentation was removed.  The portals were then reapproximated well coapted with 3-0 nylon.  Attention was then directed to the lateral aspect of the ankle along the course the peroneal tendons where an incision was made from the posterior lateral aspect of the ankle following the course the peroneal tendons to the dorsal lateral foot.  The incision was deepened to the subcutaneous tissues utilizing sharp and blunt dissection  care was taken to identify and retract all vital neurovascular structures no venous contributories were cauterized as necessary.  The sural nerve was identified during this time and retracted throughout the remainder of the case.  The sural nerve did appear to have a branch that connected from the sural nerve to the superficial peroneal nerve to the dorsum of the foot but this branch was able to be saved throughout the remainder the case and retracted gently.  The peroneal tendon sheath is not identified and transected along the course the entire incision and the peroneus longus and brevis tendons were then identified.  Notable synovitic fluid was released from the tendon sheath once the incision was made indicating that these tendons were very inflamed.  There also appeared to be a large amount of synovitis around the peroneus longus tendon and a mild amount of synovitis around the peroneus brevis tendon.  The peroneus longus appeared to have a large tear and was very frayed and very thin at the level of the lateral malleolus both posteriorly and anteriorly but appeared to be less diseased distally and more proximally to that.  The tendon appeared to also not be taut at all and appeared to be very loose.  At least 50% of the normal tendon thickness was absent during this examination.  The peroneus brevis tendon on the other  hand appeared to be intact but did appear to have a mildly low-lying muscle belly.  This low-lying muscle belly was resected and passed off the operative site.  Any synovitic tissue from the peroneus brevis and longus tendons were resected and passed off the operative site.  Topaz instrumentation was then used to Topaz the peroneus longus tendon.  At this time it was determined to perform an anastomosis of the peroneus brevis and longus tendons due to the nature of the disease of the peroneus longus tendon.  After debridement was performed anastomosis was performed creating 1 solid tendon about  the lateral ankle with 2-0 Ethibond suture in a running interconnected stitch.  This was then reinforced further with 2-0 Vicryl.  The surgical site was flushed with copious amounts normal sterile saline.   At this time attention was then directed to the lateral malleolus under fluoroscopic guidance where the subchondroplasty kit from [employer] was then used to drill into the distal fibula through the posterior lateral malleolar bone cyst and into the area of bony inflammation it was seen on the MRI at the distal fibula.  Once the drill was placed with the cannula into this area the trocar was removed.  At this time approximately 1.5 cc of subchondroplasty material was then injected to the area while drawling the cannula back.  There did appear to be mild crack in the bone cyst and some of the subchondroplasty material was leaking out from this area.  This was held in the place until the material dried.  The cyst was then packed slightly further with demineralized bone chips.  The repair appeared to be solid and once again this was performed under fluoroscopic guidance.  The subchondroplasty material did appear to be injected within the distal fibula and the subchondral cyst appeared to be more dark indicating successful placement of bone graft.  The pneumatic thigh tourniquet was released during this time and not inflated throughout the remainder of the case.  The surgical site was flushed once again with copious amounts normal sterile saline.  The superior and inferior peroneal retinaculum's were then repaired with 3-0 Vicryl as well as the rest of the peroneal tendon sheath.  A portion of the distal peroneal tendon sheath was very diseased and it was unable to be repaired at the distal lateral aspect of the incision line.  The sural nerve once again was protected during this.  The subcutaneous tissue was then reapproximated well coapted with a combination of 3-0 Vicryl and 4-0 Monocryl.  The skin was then  reapproximated well coapted with 4-0 and 3-0 nylon in a combination of simple and horizontal mattress type stitching.  Final syringing was then taken once again showing that the cyst had been filled to the lateral malleolus at the posterior aspect and did appear to have subchondral plasty material within the distal fibula.  The ankle appeared to sit correct disposition overall and appeared to be clear.  The ankle mortise appeared to be intact.  The foot appeared to be secondary rectus position clinically.  Postop dressing was applied consisting of Xeroform to the incision lines followed by 4 x 4 gauze, Kerlix, web roll, posterior splint, Ace wrap.  Patient tolerated the procedure and anesthesia well was transferred to recovery room vital signs stable vascular status intact to all toes of the left foot.  Patient will be discharged home with the appropriate follow-up instructions and postoperative medications.  Patient to follow-up in clinic within 1 week of surgical date.  Patient is to call office with any further problems or concerns or seek medical attention more promptly.  COMPLICATIONS: none  CONDITION: good, stable  Caroline More, DPM

## 2020-03-08 ENCOUNTER — Encounter: Payer: Self-pay | Admitting: Podiatry

## 2020-06-06 ENCOUNTER — Ambulatory Visit
Admission: RE | Admit: 2020-06-06 | Discharge: 2020-06-06 | Disposition: A | Discharge status: Home / Self Care | Payer: Medicare Other | Source: Ambulatory Visit | Attending: Cardiology | Admitting: Cardiology

## 2020-06-06 ENCOUNTER — Other Ambulatory Visit: Payer: Self-pay

## 2020-06-06 DIAGNOSIS — I110 Hypertensive heart disease with heart failure: Secondary | ICD-10-CM | POA: Diagnosis not present

## 2020-06-06 DIAGNOSIS — E785 Hyperlipidemia, unspecified: Secondary | ICD-10-CM | POA: Insufficient documentation

## 2020-06-06 DIAGNOSIS — I2510 Atherosclerotic heart disease of native coronary artery without angina pectoris: Secondary | ICD-10-CM | POA: Diagnosis not present

## 2020-06-06 DIAGNOSIS — G4733 Obstructive sleep apnea (adult) (pediatric): Secondary | ICD-10-CM | POA: Diagnosis not present

## 2020-06-06 DIAGNOSIS — R600 Localized edema: Secondary | ICD-10-CM | POA: Insufficient documentation

## 2020-06-06 DIAGNOSIS — I5031 Acute diastolic (congestive) heart failure: Secondary | ICD-10-CM | POA: Diagnosis not present

## 2020-06-06 DIAGNOSIS — I38 Endocarditis, valve unspecified: Secondary | ICD-10-CM | POA: Insufficient documentation

## 2020-06-06 MED ORDER — SODIUM CHLORIDE FLUSH 0.9 % IV SOLN
INTRAVENOUS | Status: AC
  Filled 2020-06-06: qty 10

## 2020-06-06 MED ORDER — FUROSEMIDE 10 MG/ML IJ SOLN: 80.0000 mg | Freq: Once | INTRAMUSCULAR | Status: AC

## 2020-06-06 MED ORDER — FUROSEMIDE 10 MG/ML IJ SOLN
INTRAMUSCULAR | Status: AC
  Administered 2020-06-06: 80 mg via INTRAVENOUS
  Filled 2020-06-06: qty 8

## 2020-06-06 NOTE — Progress Notes (Signed)
Patient instructed to contact cardiologist if SOB continues or worsens or if not diuresing from IV lasix given.  Verbalized understanding.

## 2020-06-06 NOTE — Discharge Instructions (Signed)
Heart Failure, Diagnosis  Heart failure is a condition in which the heart has trouble pumping blood. This may mean that the heart cannot pump enough blood out to the body or that the heart does not fill up with enough blood. For some people with heart failure, fluid may back up into the lungs. There may also be swelling (edema) in the lower legs. Heart failure is usually a long-term (chronic) condition. It is important for you to take good care of yourself and follow the treatment plan from your health care provider. What are the causes? This condition may be caused by:  High blood pressure (hypertension). Hypertension causes the heart muscle to work harder than normal.  Coronary artery disease, or CAD. CAD is the buildup of cholesterol and fat (plaque) in the arteries of the heart.  Heart attack, also called myocardial infarction. This injures the heart muscle, making it hard for the heart to pump blood.  Abnormal heart valves. The valves do not open and close properly, forcing the heart to pump harder to keep the blood flowing.  Heart muscle disease, inflammation, or infection (cardiomyopathy or myocarditis). This is damage to the heart muscle. It can increase the risk of heart failure.  Lung disease. The heart works harder when the lungs are not healthy. What increases the risk? The risk of heart failure increases as a person ages. This condition is also more likely to develop in people who:  Are obese.  Are male.  Use tobacco or nicotine products.  Abuse alcohol or drugs.  Have taken medicines that can damage the heart, such as chemotherapy drugs.  Have any of these conditions: ? Diabetes. ? Abnormal heart rhythms. ? Thyroid problems. ? Low blood counts (anemia). ? Chronic kidney disease.  Have a family history of heart failure. What are the signs or symptoms? Symptoms of this condition include:  Shortness of breath with activity, such as when climbing stairs.  A cough  that does not go away.  Swelling of the feet, ankles, legs, or abdomen.  Losing or gaining weight for no reason.  Trouble breathing when lying flat.  Waking from sleep because of the need to sit up and get more air.  Rapid heartbeat.  Tiredness (fatigue) and loss of energy.  Feeling light-headed, dizzy, or close to fainting.  Nausea or loss of appetite.  Waking up more often during the night to urinate (nocturia).  Confusion. How is this diagnosed? This condition is diagnosed based on:  Your medical history, symptoms, and a physical exam.  Diagnostic tests, which may include: ? Echocardiogram. ? Electrocardiogram (ECG). ? Chest X-ray. ? Blood tests. ? Exercise stress test. ? Cardiac MRI. ? Cardiac catheterization and angiogram. ? Radionuclide scans. How is this treated? Treatment for this condition is aimed at managing the symptoms of heart failure. Medicines Treatment may include medicines that:  Help lower blood pressure by relaxing (dilating) the blood vessels. These medicines are called ACE inhibitors (angiotensin-converting enzyme), ARBs (angiotensin receptor blockers), or vasodilators.  Cause the kidneys to remove salt and water from the blood through urination (diuretics).  Improve heart muscle strength and prevent the heart from beating too fast (beta blockers).  Increase the force of the heartbeat (digoxin).  Lower heart rates. Certain diabetes medicines (SGLT-2 inhibitors) may also be used in treatment. Healthy behavior changes Treatment may also include making healthy lifestyle changes, such as:  Reaching and staying at a healthy weight.  Not using tobacco or nicotine products.  Eating heart-healthy foods.    Limiting or avoiding alcohol.  Stopping the use of illegal drugs.  Being physically active.  Participating in a cardiac rehabilitation program, which is a treatment program to improve your health and well-being through exercise training,  education, and counseling. Other treatments Other treatments may include:  Procedures to open blocked arteries or repair damaged valves.  Placing a pacemaker to improve heart function (cardiac resynchronization therapy).  Placing a device to treat serious abnormal heart rhythms (implantable cardioverter defibrillator, or ICD).  Placing a device to improve the pumping ability of the heart (left ventricular assist device, or LVAD).  Receiving a healthy heart from a donor (heart transplant). This is done when other treatments have not helped. Follow these instructions at home:  Manage other health conditions as told by your health care provider. These may include hypertension, diabetes, thyroid disease, or abnormal heart rhythms.  Get ongoing education and support as needed. Learn as much as you can about heart failure.  Keep all follow-up visits. This is important. Summary  Heart failure is a condition in which the heart has trouble pumping blood.  This condition is commonly caused by high blood pressure and other diseases of the heart and lungs.  Symptoms of this condition include shortness of breath, tiredness (fatigue), nausea, and swelling of the feet, ankles, legs, or abdomen.  Treatments for this condition may include medicines, lifestyle changes, and surgery.  Manage other health conditions as told by your health care provider. This information is not intended to replace advice given to you by your health care provider. Make sure you discuss any questions you have with your health care provider. Document Revised: 09/09/2019 Document Reviewed: 09/09/2019 Elsevier Patient Education  2021 [employer]  

## 2020-06-07 ENCOUNTER — Ambulatory Visit: Payer: Medicare Other

## 2020-06-10 ENCOUNTER — Other Ambulatory Visit: Payer: Self-pay

## 2020-06-10 ENCOUNTER — Ambulatory Visit
Admission: RE | Admit: 2020-06-10 | Discharge: 2020-06-10 | Disposition: A | Discharge status: Home / Self Care | Payer: Medicare Other | Source: Ambulatory Visit | Attending: Physician Assistant | Admitting: Physician Assistant

## 2020-06-10 ENCOUNTER — Emergency Department: Payer: Medicare Other

## 2020-06-10 ENCOUNTER — Inpatient Hospital Stay
Admission: EM | Admit: 2020-06-10 | Discharge: 2020-06-12 | DRG: 683 | Disposition: A | Discharge status: Home / Self Care | Payer: Medicare Other | Attending: Internal Medicine | Admitting: Internal Medicine

## 2020-06-10 ENCOUNTER — Other Ambulatory Visit: Payer: Self-pay | Admitting: Physician Assistant

## 2020-06-10 DIAGNOSIS — E782 Mixed hyperlipidemia: Secondary | ICD-10-CM

## 2020-06-10 DIAGNOSIS — E871 Hypo-osmolality and hyponatremia: Secondary | ICD-10-CM | POA: Diagnosis present

## 2020-06-10 DIAGNOSIS — N179 Acute kidney failure, unspecified: Secondary | ICD-10-CM

## 2020-06-10 DIAGNOSIS — Z6836 Body mass index (BMI) 36.0-36.9, adult: Secondary | ICD-10-CM

## 2020-06-10 DIAGNOSIS — Z20822 Contact with and (suspected) exposure to covid-19: Secondary | ICD-10-CM | POA: Diagnosis present

## 2020-06-10 DIAGNOSIS — G250 Essential tremor: Secondary | ICD-10-CM | POA: Diagnosis present

## 2020-06-10 DIAGNOSIS — F418 Other specified anxiety disorders: Secondary | ICD-10-CM | POA: Diagnosis present

## 2020-06-10 DIAGNOSIS — K4090 Unilateral inguinal hernia, without obstruction or gangrene, not specified as recurrent: Secondary | ICD-10-CM | POA: Diagnosis present

## 2020-06-10 DIAGNOSIS — M549 Dorsalgia, unspecified: Secondary | ICD-10-CM | POA: Diagnosis present

## 2020-06-10 DIAGNOSIS — G4730 Sleep apnea, unspecified: Secondary | ICD-10-CM | POA: Diagnosis present

## 2020-06-10 DIAGNOSIS — I4510 Unspecified right bundle-branch block: Secondary | ICD-10-CM | POA: Diagnosis present

## 2020-06-10 DIAGNOSIS — J45909 Unspecified asthma, uncomplicated: Secondary | ICD-10-CM | POA: Diagnosis present

## 2020-06-10 DIAGNOSIS — G8929 Other chronic pain: Secondary | ICD-10-CM | POA: Diagnosis present

## 2020-06-10 DIAGNOSIS — Z823 Family history of stroke: Secondary | ICD-10-CM

## 2020-06-10 DIAGNOSIS — K5900 Constipation, unspecified: Secondary | ICD-10-CM

## 2020-06-10 DIAGNOSIS — I2510 Atherosclerotic heart disease of native coronary artery without angina pectoris: Secondary | ICD-10-CM | POA: Diagnosis not present

## 2020-06-10 DIAGNOSIS — E860 Dehydration: Secondary | ICD-10-CM | POA: Diagnosis present

## 2020-06-10 DIAGNOSIS — I10 Essential (primary) hypertension: Secondary | ICD-10-CM | POA: Diagnosis present

## 2020-06-10 DIAGNOSIS — F419 Anxiety disorder, unspecified: Secondary | ICD-10-CM | POA: Diagnosis present

## 2020-06-10 DIAGNOSIS — Z981 Arthrodesis status: Secondary | ICD-10-CM

## 2020-06-10 DIAGNOSIS — I890 Lymphedema, not elsewhere classified: Secondary | ICD-10-CM | POA: Diagnosis present

## 2020-06-10 DIAGNOSIS — Z888 Allergy status to other drugs, medicaments and biological substances status: Secondary | ICD-10-CM

## 2020-06-10 DIAGNOSIS — Z8673 Personal history of transient ischemic attack (TIA), and cerebral infarction without residual deficits: Secondary | ICD-10-CM

## 2020-06-10 DIAGNOSIS — M1990 Unspecified osteoarthritis, unspecified site: Secondary | ICD-10-CM | POA: Diagnosis present

## 2020-06-10 DIAGNOSIS — Z825 Family history of asthma and other chronic lower respiratory diseases: Secondary | ICD-10-CM

## 2020-06-10 DIAGNOSIS — R600 Localized edema: Secondary | ICD-10-CM

## 2020-06-10 DIAGNOSIS — M7989 Other specified soft tissue disorders: Secondary | ICD-10-CM | POA: Diagnosis not present

## 2020-06-10 DIAGNOSIS — Z8616 Personal history of COVID-19: Secondary | ICD-10-CM

## 2020-06-10 DIAGNOSIS — G4700 Insomnia, unspecified: Secondary | ICD-10-CM | POA: Diagnosis present

## 2020-06-10 DIAGNOSIS — E785 Hyperlipidemia, unspecified: Secondary | ICD-10-CM | POA: Diagnosis present

## 2020-06-10 DIAGNOSIS — Z83438 Family history of other disorder of lipoprotein metabolism and other lipidemia: Secondary | ICD-10-CM

## 2020-06-10 DIAGNOSIS — F32A Depression, unspecified: Secondary | ICD-10-CM

## 2020-06-10 DIAGNOSIS — Z87898 Personal history of other specified conditions: Secondary | ICD-10-CM

## 2020-06-10 DIAGNOSIS — Z7901 Long term (current) use of anticoagulants: Secondary | ICD-10-CM

## 2020-06-10 DIAGNOSIS — Z8619 Personal history of other infectious and parasitic diseases: Secondary | ICD-10-CM

## 2020-06-10 DIAGNOSIS — Z7951 Long term (current) use of inhaled steroids: Secondary | ICD-10-CM

## 2020-06-10 DIAGNOSIS — Z808 Family history of malignant neoplasm of other organs or systems: Secondary | ICD-10-CM

## 2020-06-10 DIAGNOSIS — E669 Obesity, unspecified: Secondary | ICD-10-CM | POA: Diagnosis present

## 2020-06-10 DIAGNOSIS — Z8249 Family history of ischemic heart disease and other diseases of the circulatory system: Secondary | ICD-10-CM

## 2020-06-10 DIAGNOSIS — R569 Unspecified convulsions: Secondary | ICD-10-CM | POA: Diagnosis present

## 2020-06-10 DIAGNOSIS — Z79899 Other long term (current) drug therapy: Secondary | ICD-10-CM

## 2020-06-10 DIAGNOSIS — I872 Venous insufficiency (chronic) (peripheral): Secondary | ICD-10-CM | POA: Diagnosis present

## 2020-06-10 DIAGNOSIS — K219 Gastro-esophageal reflux disease without esophagitis: Secondary | ICD-10-CM | POA: Diagnosis present

## 2020-06-10 DIAGNOSIS — Z96651 Presence of right artificial knee joint: Secondary | ICD-10-CM | POA: Diagnosis present

## 2020-06-10 DIAGNOSIS — E781 Pure hyperglyceridemia: Secondary | ICD-10-CM | POA: Diagnosis present

## 2020-06-10 LAB — COMPREHENSIVE METABOLIC PANEL
ALT: 23 U/L (ref 0–44)
AST: 26 U/L (ref 15–41)
Albumin: 4.1 g/dL (ref 3.5–5.0)
Alkaline Phosphatase: 46 U/L (ref 38–126)
Anion gap: 12 (ref 5–15)
BUN: 51 mg/dL — ABNORMAL HIGH (ref 8–23)
CO2: 28 mmol/L (ref 22–32)
Calcium: 8.8 mg/dL — ABNORMAL LOW (ref 8.9–10.3)
Chloride: 86 mmol/L — ABNORMAL LOW (ref 98–111)
Creatinine, Ser: 2.36 mg/dL — ABNORMAL HIGH (ref 0.61–1.24)
GFR, Estimated: 28 mL/min — ABNORMAL LOW (ref >60)
Glucose, Bld: 176 mg/dL — ABNORMAL HIGH (ref 70–99)
Potassium: 4.4 mmol/L (ref 3.5–5.1)
Sodium: 126 mmol/L — ABNORMAL LOW (ref 135–145)
Total Bilirubin: 0.8 mg/dL (ref 0.3–1.2)
Total Protein: 6.8 g/dL (ref 6.5–8.1)

## 2020-06-10 LAB — URINALYSIS, COMPLETE (UACMP) WITH MICROSCOPIC
Bacteria, UA: NONE SEEN
Bilirubin Urine: NEGATIVE
Glucose, UA: NEGATIVE mg/dL
Hgb urine dipstick: NEGATIVE
Ketones, ur: NEGATIVE mg/dL
Leukocytes,Ua: NEGATIVE
Nitrite: NEGATIVE
Protein, ur: NEGATIVE mg/dL
Specific Gravity, Urine: 1.008 (ref 1.005–1.030)
Squamous Epithelial / HPF: NONE SEEN (ref 0–5)
WBC, UA: NONE SEEN WBC/hpf (ref 0–5)
pH: 6 (ref 5.0–8.0)

## 2020-06-10 LAB — CBC WITH DIFFERENTIAL/PLATELET
Abs Immature Granulocytes: 0.02 10*3/uL (ref 0.00–0.07)
Basophils Absolute: 0 10*3/uL (ref 0.0–0.1)
Basophils Relative: 1 %
Eosinophils Absolute: 0.4 10*3/uL (ref 0.0–0.5)
Eosinophils Relative: 8 %
HCT: 37.8 % — ABNORMAL LOW (ref 39.0–52.0)
Hemoglobin: 13.6 g/dL (ref 13.0–17.0)
Immature Granulocytes: 0 %
Lymphocytes Relative: 38 %
Lymphs Abs: 1.8 10*3/uL (ref 0.7–4.0)
MCH: 32.8 pg (ref 26.0–34.0)
MCHC: 36 g/dL (ref 30.0–36.0)
MCV: 91.1 fL (ref 80.0–100.0)
Monocytes Absolute: 0.7 10*3/uL (ref 0.1–1.0)
Monocytes Relative: 14 %
Neutro Abs: 1.8 10*3/uL (ref 1.7–7.7)
Neutrophils Relative %: 39 %
Platelets: 153 10*3/uL (ref 150–400)
RBC: 4.15 MIL/uL — ABNORMAL LOW (ref 4.22–5.81)
RDW: 12.9 % (ref 11.5–15.5)
WBC: 4.6 10*3/uL (ref 4.0–10.5)
nRBC: 0 % (ref 0.0–0.2)

## 2020-06-10 LAB — BASIC METABOLIC PANEL
Anion gap: 10 (ref 5–15)
BUN: 47 mg/dL — ABNORMAL HIGH (ref 8–23)
CO2: 29 mmol/L (ref 22–32)
Calcium: 8.8 mg/dL — ABNORMAL LOW (ref 8.9–10.3)
Chloride: 92 mmol/L — ABNORMAL LOW (ref 98–111)
Creatinine, Ser: 1.99 mg/dL — ABNORMAL HIGH (ref 0.61–1.24)
GFR, Estimated: 35 mL/min — ABNORMAL LOW (ref >60)
Glucose, Bld: 182 mg/dL — ABNORMAL HIGH (ref 70–99)
Potassium: 4.7 mmol/L (ref 3.5–5.1)
Sodium: 131 mmol/L — ABNORMAL LOW (ref 135–145)

## 2020-06-10 LAB — MAGNESIUM: Magnesium: 2 mg/dL (ref 1.7–2.4)

## 2020-06-10 LAB — BRAIN NATRIURETIC PEPTIDE: B Natriuretic Peptide: 17.7 pg/mL (ref 0.0–100.0)

## 2020-06-10 LAB — OSMOLALITY: Osmolality: 294 mOsm/kg (ref 275–295)

## 2020-06-10 MED ORDER — ALBUTEROL SULFATE HFA 108 (90 BASE) MCG/ACT IN AERS
2.0000 | INHALATION_SPRAY | Freq: Four times a day (QID) | RESPIRATORY_TRACT | Status: DC | PRN
  Filled 2020-06-10: qty 6.7

## 2020-06-10 MED ORDER — SODIUM CHLORIDE 0.9 % IV SOLN: INTRAVENOUS | Status: DC

## 2020-06-10 MED ORDER — GABAPENTIN 400 MG PO CAPS: 400.0000 mg | ORAL_CAPSULE | Freq: Two times a day (BID) | ORAL | Status: DC

## 2020-06-10 MED ORDER — GABAPENTIN 100 MG PO CAPS
200.0000 mg | ORAL_CAPSULE | Freq: Two times a day (BID) | ORAL | Status: DC
  Administered 2020-06-10 – 2020-06-12 (×4): 200 mg via ORAL
  Filled 2020-06-10 (×4): qty 2

## 2020-06-10 MED ORDER — LORAZEPAM 0.5 MG PO TABS
0.5000 mg | ORAL_TABLET | Freq: Two times a day (BID) | ORAL | Status: DC | PRN
  Administered 2020-06-10 – 2020-06-11 (×3): 0.5 mg via ORAL
  Filled 2020-06-10 (×3): qty 1

## 2020-06-10 MED ORDER — SODIUM CHLORIDE 0.9 % IV SOLN: Freq: Once | INTRAVENOUS | Status: AC

## 2020-06-10 MED ORDER — BUDESONIDE 0.25 MG/2ML IN SUSP: 0.2500 mg | Freq: Every day | RESPIRATORY_TRACT | Status: DC | PRN

## 2020-06-10 MED ORDER — ACETAMINOPHEN 325 MG PO TABS: 650.0000 mg | ORAL_TABLET | Freq: Four times a day (QID) | ORAL | Status: DC | PRN

## 2020-06-10 MED ORDER — ATORVASTATIN CALCIUM 80 MG PO TABS: 80.0000 mg | ORAL_TABLET | Freq: Every day | ORAL | Status: DC

## 2020-06-10 MED ORDER — MEMANTINE HCL ER 28 MG PO CP24: 28.0000 mg | ORAL_CAPSULE | Freq: Every day | ORAL | Status: DC

## 2020-06-10 MED ORDER — METOPROLOL SUCCINATE ER 50 MG PO TB24
50.0000 mg | ORAL_TABLET | Freq: Two times a day (BID) | ORAL | Status: DC
  Administered 2020-06-11 – 2020-06-12 (×2): 50 mg via ORAL
  Filled 2020-06-10 (×3): qty 1

## 2020-06-10 MED ORDER — POLYETHYLENE GLYCOL 3350 17 G PO PACK: 17.0000 g | PACK | Freq: Every day | ORAL | Status: DC | PRN

## 2020-06-10 MED ORDER — ENOXAPARIN SODIUM 40 MG/0.4ML ~~LOC~~ SOLN
40.0000 mg | SUBCUTANEOUS | Status: DC
  Administered 2020-06-10: 40 mg via SUBCUTANEOUS
  Filled 2020-06-10: qty 0.4

## 2020-06-10 MED ORDER — FLUTICASONE FUROATE-VILANTEROL 100-25 MCG/INH IN AEPB
1.0000 | INHALATION_SPRAY | Freq: Every day | RESPIRATORY_TRACT | Status: DC
  Administered 2020-06-11 – 2020-06-12 (×2): 1 via RESPIRATORY_TRACT
  Filled 2020-06-10: qty 28

## 2020-06-10 MED ORDER — APIXABAN 2.5 MG PO TABS: 2.5000 mg | ORAL_TABLET | Freq: Two times a day (BID) | ORAL | Status: DC

## 2020-06-10 MED ORDER — MONTELUKAST SODIUM 10 MG PO TABS
10.0000 mg | ORAL_TABLET | Freq: Every day | ORAL | Status: DC
  Administered 2020-06-10 – 2020-06-11 (×2): 10 mg via ORAL
  Filled 2020-06-10 (×3): qty 1

## 2020-06-10 MED ORDER — ASPIRIN EC 81 MG PO TBEC
81.0000 mg | DELAYED_RELEASE_TABLET | Freq: Every day | ORAL | Status: DC
  Administered 2020-06-11 – 2020-06-12 (×2): 81 mg via ORAL
  Filled 2020-06-10 (×2): qty 1

## 2020-06-10 MED ORDER — PANTOPRAZOLE SODIUM 40 MG PO TBEC
40.0000 mg | DELAYED_RELEASE_TABLET | Freq: Every day | ORAL | Status: DC
  Administered 2020-06-11 – 2020-06-12 (×2): 40 mg via ORAL
  Filled 2020-06-10 (×2): qty 1

## 2020-06-10 MED ORDER — LAMOTRIGINE 25 MG PO TABS
100.0000 mg | ORAL_TABLET | Freq: Every day | ORAL | Status: DC
  Administered 2020-06-10 – 2020-06-11 (×2): 100 mg via ORAL
  Filled 2020-06-10 (×2): qty 4

## 2020-06-10 MED ORDER — GEMFIBROZIL 600 MG PO TABS: 600.0000 mg | ORAL_TABLET | Freq: Two times a day (BID) | ORAL | Status: DC

## 2020-06-10 MED ORDER — VENLAFAXINE HCL ER 150 MG PO CP24
150.0000 mg | ORAL_CAPSULE | Freq: Every day | ORAL | Status: DC
  Administered 2020-06-11 – 2020-06-12 (×2): 150 mg via ORAL
  Filled 2020-06-10 (×2): qty 1

## 2020-06-10 MED ORDER — ACETAMINOPHEN 650 MG RE SUPP: 650.0000 mg | Freq: Four times a day (QID) | RECTAL | Status: DC | PRN

## 2020-06-10 MED ORDER — SODIUM CHLORIDE 0.9% FLUSH
3.0000 mL | Freq: Two times a day (BID) | INTRAVENOUS | Status: DC
  Administered 2020-06-10 – 2020-06-11 (×3): 3 mL via INTRAVENOUS

## 2020-06-10 NOTE — ED Triage Notes (Addendum)
Pt to ED via POV sent by [facility] for evaluation for possible AKI according to labwork.  Has been on oral diuretics for 1 week. Has lower leg edema, +1 pitting to knee noted on LLE.  Unable to have BM on own, using suppositories for past week.  Has been having trouble urinating, started on Flomax this week.  Pt states that starting this morning, pt has slightly blurry vision and can hardly keep eyes open. Same since this morning.  Pt in NAD with wife at bedside.

## 2020-06-10 NOTE — ED Provider Notes (Addendum)
[facility] Emergency Department Provider Note  ____________________________________________   Event DateTime   First MD Initiated Contact with Patient 06/10/20 1650     (approximate)  I have reviewed the triage vital signs and the nursing notes.   HISTORY  Chief Complaint Leg Swelling and Kidney problem    HPI Johnny Mccoy is a 75 y.o. male with past medical history as below here with worsening leg swelling and weakness.  The patient has had a fairly extensive recent work-up for increasing lower extremity edema and shortness of breath.  He was initially seen by cardiology and had his Lasix switched to torsemide.  This has been doubled over the last week.  He was also started on metolazone and had an echocardiogram, which was normal.  He states that despite this, he has had persistent lower extremity swelling as well as abdominal distention and generalized fatigue.  He said shortness of breath with exertion.  He had a CT scan ordered by his primary today for his abdominal distention which was also unremarkable.  He was sent here after repeat lab work today showed worsening kidney function.  He has no history of renal failure.  He has been taking the Lasix and metolazone as prescribed and states he has had actually significantly decreased urine output over the last several days.  He also has a history of nocturia and was started on Flomax, but states this is not helped his urine output.  Denies known history of urinary retention.  No fevers or chills.  No chest pain.        Past Medical History:  Diagnosis Date  . Anxiety    takes Lorazepam daily  . Arthritis   . Asthma    Albuterol as needed.Breo daily as well as Flovent  . Blood clot in vein    after r knee replacement.  filter placed and removed.  Marland Kitchen CAD (coronary artery disease)   . Cataract   . Cataracts, bilateral    immature  . Chronic back pain    compression  . Complication of anesthesia     seizures after partial knee in 2014.Surgery was in am and an still in post op until 9 that night. Was sent to [facility] for 5 days d/t uncontrollable shaking,incoherant (02/26/20 pt states was tremors from fentanyl)  . COVID-19 05/2018  . Depressed    takes Pristiq daily  . Essential tremor   . GERD (gastroesophageal reflux disease)    takes Dexilant daily as well as Zantac  . 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 History of bronchitis    Jan-Mar 2017  . History of hiatal hernia   . Hyperlipidemia    takes Atorvastatin daily  . Hypertension    takes HCTZ,Amlodipine,and Metoprolol daily  . Insomnia    takes Trazodone nightly   . Joint pain   . Muscle spasm    in neck.Takes Flexeril as needed  . Numbness    right knee,right hand,and toes on left  . Seizures (HCC)    [place]. takes Lamotrigine daily. Last seizure 6+ yrs ago  . Sleep apnea    CPAP    Patient Active Problem List   Diagnosis Date Noted  . AKI (acute kidney injury) ([place]) 06/10/2020  . Hyponatremia 06/10/2020  . History of seizures 06/10/2020  . Angina pectoris ([place]) 03/29/2019  . Diastasis of rectus abdominis 03/18/2018  . Lymphedema 12/16/2015  . Chronic venous insufficiency 12/16/2015  .  Leg swelling 12/16/2015  . 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 Anxiety 05/23/2015  . Acid reflux 05/23/2015  . HBV (hepatitis B virus) infection 05/23/2015  . History of cardiac catheterization 05/23/2015  . Combined fat and carbohydrate induced hyperlipemia 05/23/2015  . CAD (coronary artery disease) 05/23/2015  . Clinical depression 05/23/2015  . Cough 07/26/2014  . Benign essential HTN 07/16/2014  . Breathlessness on exertion 02/12/2014  . History of CVA (cerebrovascular accident) 06/01/2013  . Bundle branch block, right 05/29/2012  . History of knee surgery 05/26/2012    Past Surgical History:  Procedure Laterality Date  . ANKLE ARTHROSCOPY Left 03/07/2020   Procedure: ANKLE ARTHROSCOPY;   Surgeon: Caroline More, DPM;  Location: [facility];  Service: Podiatry;  Laterality: Left;  . BACK SURGERY     x3  . CARDIAC CATHETERIZATION     2015  . CARPAL TUNNEL RELEASE    . COLONOSCOPY    . CORONARY ANGIOPLASTY WITH STENT PLACEMENT     x 3  . dental implants    . ESOPHAGOGASTRODUODENOSCOPY    . EYE SURGERY Bilateral   . EYE SURGERY     both eyes in 60's d/t muscle causing to squint  . HERNIA REPAIR     umbilical  . JOINT REPLACEMENT Right    knee  . KNEE ARTHROSCOPY    . LEFT HEART CATH AND CORONARY ANGIOGRAPHY Left 04/12/2019   Procedure: LEFT HEART CATH AND CORONARY ANGIOGRAPHY;  Surgeon: Corey Skains, MD;  Location: [facility];  Service: Cardiovascular;  Laterality: Left;  Marland Kitchen MEDIAL PARTIAL KNEE REPLACEMENT Right   . MEDIASTERNOTOMY N/A 10/28/2015   Procedure: PARTIAL STERNOTOMY;  Surgeon: [name], MD;  Location: [facility];  Service: Thoracic;  Laterality: N/A;  . numerous hand surgery    . ORIF ANKLE FRACTURE Left 03/07/2020   Procedure: OPEN REDUCTION INTERNAL FIXATION (ORIF) ANKLE FRACTURE;  Surgeon: Caroline More, DPM;  Location: [place];  Service: Podiatry;  Laterality: Left;  . 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   Procedure: RESECTION OF anterier MEDIASTINAL TUMOR;  Surgeon: [name], MD;  Location: [place];  Service: Thoracic;  Laterality: N/A;  . right hand fusion    . 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 TENDON REPAIR Left 03/07/2020   Procedure: FLEXOR TENDON REPAIR;  Surgeon: Caroline More, DPM;  Location: [place];  Service: Podiatry;  Laterality: Left;  sleep apnea  . TONSILLECTOMY      Prior to Admission medications   Medication Sig Start Date End Date Taking? Authorizing Provider  acetaminophen (TYLENOL) 500 MG tablet Take 1,000 mg by mouth every 6 (six) hours as needed for fever or headache.    [provider]  albuterol (VENTOLIN HFA) 108 (90 Base) MCG/ACT  inhaler Inhale 2 puffs into the lungs every 6 (six) hours as needed for wheezing or shortness of breath.    [provider]  apixaban (ELIQUIS) 2.5 MG TABS tablet Take 1 tablet (2.5 mg total) by mouth 2 (two) times daily. 03/08/20   Caroline More, DPM  atorvastatin (LIPITOR) 80 MG tablet Take 80 mg by mouth at bedtime.  09/17/14 02/26/20  [provider]  BREO ELLIPTA 100-25 MCG/INH AEPB Inhale 1 puff into the lungs daily.  02/18/15   [provider]  budesonide (PULMICORT) 0.25 MG/2ML nebulizer solution Take 0.25 mg by nebulization daily as needed (shortness of breath).    [provider]  desvenlafaxine (  PRISTIQ) 100 MG 24 hr tablet Take 100 mg by mouth daily.    [provider]  furosemide (LASIX) 20 MG tablet Take 20 mg by mouth daily as needed for edema.  03/17/19   [provider]  gabapentin (NEURONTIN) 400 MG capsule Take 400 mg by mouth 2 (two) times daily.    [provider]  gemfibrozil (LOPID) 600 MG tablet Take 600 mg by mouth 2 (two) times daily.  03/07/18 02/26/20  [provider]  lamoTRIgine (LAMICTAL) 100 MG tablet Take 100 mg by mouth at bedtime. 03/02/19   [provider]  lansoprazole (PREVACID) 30 MG capsule Take 30 mg by mouth daily. 03/22/19   [provider]  LORazepam (ATIVAN) 0.5 MG tablet Take 0.5 mg by mouth 2 (two) times daily.  06/21/10   [provider]  metoprolol succinate (TOPROL-XL) 50 MG 24 hr tablet Take 50 mg by mouth 2 (two) times daily.  03/07/15   [provider]  montelukast (SINGULAIR) 10 MG tablet Take 10 mg by mouth at bedtime.    [provider]  NAMENDA XR 28 MG CP24 24 hr capsule Take 28 mg by mouth daily.  05/04/15   [provider]  nitroGLYCERIN (NITROLINGUAL) 0.4 MG/SPRAY spray Place 2 sprays under the tongue every 5 (five) minutes x 3 doses as needed for chest pain.  11/26/14   [provider]  olmesartan (BENICAR) 40 MG tablet  Take 40 mg by mouth daily.  01/31/18   [provider]  traZODone (DESYREL) 100 MG tablet Take 100 mg by mouth at bedtime.  04/21/10   [provider]  valACYclovir (VALTREX) 1000 MG tablet Take 1,000 mg by mouth as needed (Fever blisters).  11/04/17   [provider]    Allergies Fentanyl and Methocarbamol  Family History  Problem Relation Age of Onset  . Cancer Father 37       Bone   . COPD Father   . Heart disease Mother   . Hyperlipidemia Mother   . Stroke Mother   . Heart disease Maternal Grandfather   . Hyperlipidemia Maternal Grandfather     Social History Social History   Tobacco Use  . Smoking status: Never Smoker  . Smokeless tobacco: Never Used  Vaping Use  . Vaping Use: Never used  Substance Use Topics  . Alcohol use: Yes    Alcohol/week: 14.0 standard drinks    Types: 14 Shots of liquor per week    Comment: wine and couple shots of liquor each evening  . Drug use: No    Review of Systems  Review of Systems  Constitutional: Positive for fatigue. Negative for chills and fever.  HENT: Negative for sore throat.   Respiratory: Positive for shortness of breath.   Cardiovascular: Positive for leg swelling. Negative for chest pain.  Gastrointestinal: Negative for abdominal pain.  Genitourinary: Negative for flank pain.  Musculoskeletal: Negative for neck pain.  Skin: Negative for rash and wound.  Allergic/Immunologic: Negative for immunocompromised state.  Neurological: Positive for weakness. Negative for numbness.  Hematological: Does not bruise/bleed easily.  All other systems reviewed and are negative.    ____________________________________________  PHYSICAL EXAM:      VITAL SIGNS: ED Triage Vitals  Enc Vitals Group     BP 06/10/20 1647 126/68     Pulse Rate 06/10/20 1647 69     Resp 06/10/20 1647 16     Temp 06/10/20 1647 98.6 F (37 C)     Temp  Source 06/10/20 1647 Oral     SpO2 06/10/20 1647 96 %     Weight 06/10/20  1646 234 lb (106.1 kg)     Height 06/10/20 1646 5\' 7"  (1.702 m)     Head Circumference --      Peak Flow --      Pain Score 06/10/20 1645 2     Pain Loc --      Pain Edu? --      Excl. in [place]? --      Physical Exam Vitals and nursing note reviewed.  Constitutional:      General: He is not in acute distress.    Appearance: He is well-developed.  HENT:     Head: Normocephalic and atraumatic.  Eyes:     Conjunctiva/sclera: Conjunctivae normal.  Cardiovascular:     Rate and Rhythm: Normal rate and regular rhythm.     Heart sounds: Normal heart sounds. No murmur heard. No friction rub.  Pulmonary:     Effort: Pulmonary effort is normal. No respiratory distress.     Breath sounds: Normal breath sounds. No wheezing or rales.  Abdominal:     General: There is distension.     Palpations: Abdomen is soft.     Tenderness: There is no abdominal tenderness.  Musculoskeletal:     Cervical back: Neck supple.     Right lower leg: Edema (1+ pitting) present.     Left lower leg: Edema (1+ pitting) present.  Skin:    General: Skin is warm.     Capillary Refill: Capillary refill takes less than 2 seconds.  Neurological:     Mental Status: He is alert and oriented to person, place, and time.     Motor: No abnormal muscle tone.       ____________________________________________   LABS (all labs ordered are listed, but only abnormal results are displayed)  Labs Reviewed  CBC WITH DIFFERENTIAL/PLATELET - Abnormal; Notable for the following components:      Result Value   RBC 4.15 (*)    HCT 37.8 (*)    All other components within normal limits  COMPREHENSIVE METABOLIC PANEL - Abnormal; Notable for the following components:   Sodium 126 (*)    Chloride 86 (*)    Glucose, Bld 176 (*)    BUN 51 (*)    Creatinine, Ser 2.36 (*)    Calcium 8.8 (*)    GFR, Estimated 28 (*)    All other components within normal limits  URINALYSIS, COMPLETE (UACMP) WITH MICROSCOPIC - Abnormal; Notable  for the following components:   Color, Urine STRAW (*)    APPearance CLEAR (*)    All other components within normal limits  BASIC METABOLIC PANEL - Abnormal; Notable for the following components:   Sodium 131 (*)    Chloride 92 (*)    Glucose, Bld 182 (*)    BUN 47 (*)    Creatinine, Ser 1.99 (*)    Calcium 8.8 (*)    GFR, Estimated 35 (*)    All other components within normal limits  SARS CORONAVIRUS 2 (TAT 6-24 HRS)  BRAIN NATRIURETIC PEPTIDE  MAGNESIUM  OSMOLALITY  BASIC METABOLIC PANEL  BASIC METABOLIC PANEL  CBC  BASIC METABOLIC PANEL    ____________________________________________  EKG: Normal sinus rhythm, ventricular rate 65.  PR 174, QRS 166, QTc 468.  Right bundle branch block.  No acute ST elevations or depressions. ________________________________________  RADIOLOGY All imaging, including plain films, CT scans, and ultrasounds, independently  reviewed by me, and interpretations confirmed via formal radiology reads.  ED MD interpretation:   CT abdomen/pelvis: No acute abnormality  Official radiology report(s): CT ABDOMEN PELVIS WO CONTRAST  Result Date: 06/10/2020 CLINICAL DATA:  Abdominal swelling. EXAM: CT ABDOMEN AND PELVIS WITHOUT CONTRAST TECHNIQUE: Multidetector CT imaging of the abdomen and pelvis was performed following the standard protocol without IV contrast. COMPARISON:  February 25, 2018. FINDINGS: Lower chest: No acute abnormality. Hepatobiliary: No focal liver abnormality is seen. No gallstones, gallbladder wall thickening, or biliary dilatation. Pancreas: Unremarkable. No pancreatic ductal dilatation or surrounding inflammatory changes. Spleen: Normal in size without focal abnormality. Adrenals/Urinary Tract: Adrenal glands are unremarkable. Kidneys are normal, without renal calculi, focal lesion, or hydronephrosis. Bladder is unremarkable. Stomach/Bowel: Stomach is within normal limits. Appendix appears normal. No evidence of bowel wall thickening,  distention, or inflammatory changes. Vascular/Lymphatic: Aortic atherosclerosis. No enlarged abdominal or pelvic lymph nodes. Reproductive: Prostate is unremarkable. Other: Moderate size fat containing right inguinal hernia is noted. No ascites is noted. Musculoskeletal: Postsurgical and degenerative changes are noted in the lower lumbar spine. No acute abnormality is noted. IMPRESSION: No acute abnormality seen in the abdomen or pelvis. Moderate size fat containing right inguinal hernia is noted. Aortic Atherosclerosis (ICD10-I70.0). Electronically Signed   By: Marijo Conception M.D.   On: 06/10/2020 14:32   DG Chest 2 View  Result Date: 06/10/2020 CLINICAL DATA:  Shortness of breath EXAM: CHEST - 2 VIEW COMPARISON:  CT 10/10/2018, radiograph 02/13/2016 FINDINGS: No focal airspace disease or effusion. Upper sternotomy changes. Bronchitic changes. Stable cardiomediastinal silhouette. No pneumothorax. IMPRESSION: No active cardiopulmonary disease.  Bronchitic changes Electronically Signed   By: Donavan Foil M.D.   On: 06/10/2020 18:50    ____________________________________________  PROCEDURES   Procedure(s) performed (including Critical Care):  Procedures  ____________________________________________  INITIAL IMPRESSION / MDM / [place] / ED COURSE  As part of my medical decision making, I reviewed the following data within the [place] notes reviewed and incorporated, Old chart reviewed, Notes from prior ED visits, and [place] Controlled Substance Database       *LENWARD ABLE was evaluated in Emergency Department on 06/10/2020 for the symptoms described in the history of present illness. He was evaluated in the context of the global COVID-19 pandemic, which necessitated consideration that the patient might be at risk for infection with the SARS-CoV-2 virus that causes COVID-19. Institutional protocols and algorithms that pertain to the evaluation of patients  at risk for COVID-19 are in a state of rapid change based on information released by regulatory bodies including the CDC and federal and state organizations. These policies and algorithms were followed during the patient's care in the ED.  Some ED evaluations and interventions may be delayed as a result of limited staffing during the pandemic.*     Medical Decision Making:  75 yo M here with AKI. Suspect over diuresis in setting of recent bilateral leg edema. It is unclear why pt had worsening edema to begin with, however. His renal function at onset of edema was normal per revie of records. TTE obtained this week unremarkable. No known liver disease. Will start cautious fluids given marked BUN:Cr elevation and AKI on labs. No fever or signs of infection. UA unremarkable. Pt does endorse some sx of BPH so will check pre- and postvoid residual as well. Admit to medicine.No leukocytosis or signs of infection. CT from today reviewed and is unremarkable, negative.  ____________________________________________  FINAL CLINICAL  IMPRESSION(S) / ED DIAGNOSES  Final diagnoses:  AKI (acute kidney injury) ([place])  Dehydration     MEDICATIONS GIVEN DURING THIS VISIT:  Medications  metoprolol succinate (TOPROL-XL) 24 hr tablet 50 mg (has no administration in time range)  venlafaxine XR (EFFEXOR-XR) 24 hr capsule 150 mg (has no administration in time range)  LORazepam (ATIVAN) tablet 0.5 mg (0.5 mg Oral Given 06/10/20 2307)  pantoprazole (PROTONIX) EC tablet 40 mg (has no administration in time range)  lamoTRIgine (LAMICTAL) tablet 100 mg (100 mg Oral Given 06/10/20 2135)  albuterol (VENTOLIN HFA) 108 (90 Base) MCG/ACT inhaler 2 puff (has no administration in time range)  fluticasone furoate-vilanterol (BREO ELLIPTA) 100-25 MCG/INH 1 puff (has no administration in time range)  budesonide (PULMICORT) nebulizer solution 0.25 mg (has no administration in time range)  montelukast (SINGULAIR) tablet 10 mg (10 mg  Oral Given 06/10/20 2136)  sodium chloride flush (NS) 0.9 % injection 3 mL (3 mLs Intravenous Given 06/10/20 2137)  acetaminophen (TYLENOL) tablet 650 mg (has no administration in time range)    Or  acetaminophen (TYLENOL) suppository 650 mg (has no administration in time range)  polyethylene glycol (MIRALAX / GLYCOLAX) packet 17 g (has no administration in time range)  0.9 %  sodium chloride infusion ( Intravenous Rate/Dose Change 06/10/20 2307)  gabapentin (NEURONTIN) capsule 200 mg (200 mg Oral Given 06/10/20 2136)  aspirin EC tablet 81 mg (has no administration in time range)  enoxaparin (LOVENOX) injection 40 mg (40 mg Subcutaneous Given 06/10/20 2135)  0.9 %  sodium chloride infusion ( Intravenous New Bag/Given 06/10/20 1843)     ED Discharge Orders    None       Note:  This document was prepared using Dragon voice recognition software and may include unintentional dictation errors.   Duffy Bruce, MD 06/10/20 0349    Duffy Bruce, MD 06/10/20 [phone]

## 2020-06-10 NOTE — ED Triage Notes (Signed)
Patient sent to ED for evaluation of AKF from Dr. Doy Hutching office.  Patient is AAOx3.  Skin warm and dry. NAD

## 2020-06-10 NOTE — ED Notes (Signed)
Pt awake and alert GCS 15.  No acute distress.  RR even and unlabored on RA -- O2 sats 95%.  Pt does report sob upon exertion; denies at rest.  Denies.  Continues to have LE +2 pitting edema.  No urinary complaints (pt has just voided 375 light clear yellow urine).  20G posterior L FA with 1L bolus infusing via gravity; dressing dry and intact.  Pt agreeable with plan for admission and updated on bed assignment

## 2020-06-10 NOTE — ED Notes (Signed)
Late entry-- wife at bedside

## 2020-06-10 NOTE — ED Notes (Signed)
Bladder scan performed. Largest volume that could be found was 38mL. Repeated several times. Bladder does not appear distended and is not tender to palpation.

## 2020-06-10 NOTE — H&P (Signed)
History and Physical   BECKHAM CAPISTRAN PVX:[id number] DOB: [birth year] DOA: 06/10/2020  PCP: Idelle Crouch, MD   Patient coming from: Home  Chief Complaint: Abnormal labs and PCP visit.  Edema.  HPI: Johnny Mccoy is a 75 y.o. male with medical history significant of GERD, anxiety, depression, CAD, hyperlipidemia, hypertension, right bundle branch block, venous insufficiency, lymphedema, CVA, asthma, cataracts, seizure who presents with concerns for AKI on recent labs by PCP.  Patient has been being evaluated for increasing edema of his lower extremities during the end of March beginning of April.  He has a degree of chronic leg swelling due to lymphedema and venous insufficiency.  However his swelling was much more significant than normal.  He was initially started on Lasix for the swelling.  He then had his diuretic switched to torsemide 20 mg by his pulmonologist after he was found to have a lot of weight gain presumably due to fluid retention.  He later saw his cardiologist and had his dose increased to torsemide 40 mg daily with metolazone. He had noticed that it seemed like he was having difficulty emptying his bladder at his cardiologist visit and Flomax was added as well.   He followed up with his PCP recently who checked lab work and he was found to have had an increase in his creatinine and electrolyte abnormalities.  He was advised to go to the ED for further evaluation.  Of note, as a part of his work-up for edema he has had a repeat echo done on 4/5 which showed normal EF of 50-55% normal RV function and mild LVH.  He also reports constipation has been having to use suppositories x1 week.  For the difficulty urination he was prescribed Flomax this week. He also reports some blurry vision and lightheadedness.  He also feels that his abdomen is somewhat distended.  He denies fevers, chills, chest pain, abdominal pain, nausea, vomiting.  ED Course: Vital signs in ED were stable.  Lab  work-up showed CMP with sodium 126, chloride 86, BUN 51, creatinine 2.36 from a baseline of 0.9.  Glucose 176.  Calcium 8.8.  CBC within normal limits.  BNP normal.  Respiratory panel for flu and COVID pending.  Urinalysis pending.  Imaging included CT abdomen pelvis which showed no acute abnormality did demonstrate moderate fat-containing right inguinal hernia.  Patient was started on 100 cc normal saline in the ED.  Review of Systems: As per HPI otherwise all other systems reviewed and are negative.  Past Medical History:  Diagnosis Date  . Anxiety    takes Lorazepam daily  . Arthritis   . Asthma    Albuterol as needed.Breo daily as well as Flovent  . Blood clot in vein    after r knee replacement.  filter placed and removed.  Marland Kitchen CAD (coronary artery disease)   . Cataract   . Cataracts, bilateral    immature  . Chronic back pain    compression  . Complication of anesthesia    seizures after partial knee in 2014.Surgery was in am and an still in post op until 9 that night. Was sent to [facility] for 5 days d/t uncontrollable shaking,incoherant (02/26/20 pt states was tremors from fentanyl)  . COVID-19 05/2018  . Depressed    takes Pristiq daily  . Essential tremor   . GERD (gastroesophageal reflux disease)    takes Dexilant daily as well as Zantac  . 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 History of bronchitis    Jan-Mar 2017  . History of hiatal hernia   . Hyperlipidemia    takes Atorvastatin daily  . Hypertension    takes HCTZ,Amlodipine,and Metoprolol daily  . Insomnia    takes Trazodone nightly   . Joint pain   . Muscle spasm    in neck.Takes Flexeril as needed  . Numbness    right knee,right hand,and toes on left  . Seizures (HCC)    [place]. takes Lamotrigine daily. Last seizure 6+ yrs ago  . Sleep apnea    CPAP    Past Surgical History:  Procedure Laterality Date  . ANKLE ARTHROSCOPY Left 03/07/2020   Procedure: ANKLE ARTHROSCOPY;  Surgeon: Caroline More, DPM;   Location: [facility];  Service: Podiatry;  Laterality: Left;  . BACK SURGERY     x3  . CARDIAC CATHETERIZATION     2015  . CARPAL TUNNEL RELEASE    . COLONOSCOPY    . CORONARY ANGIOPLASTY WITH STENT PLACEMENT     x 3  . dental implants    . ESOPHAGOGASTRODUODENOSCOPY    . EYE SURGERY Bilateral   . EYE SURGERY     both eyes in 60's d/t muscle causing to squint  . HERNIA REPAIR     umbilical  . JOINT REPLACEMENT Right    knee  . KNEE ARTHROSCOPY    . LEFT HEART CATH AND CORONARY ANGIOGRAPHY Left 04/12/2019   Procedure: LEFT HEART CATH AND CORONARY ANGIOGRAPHY;  Surgeon: Corey Skains, MD;  Location: [place] [facility];  Service: Cardiovascular;  Laterality: Left;  Marland Kitchen MEDIAL PARTIAL KNEE REPLACEMENT Right   . MEDIASTERNOTOMY N/A 10/28/2015   Procedure: PARTIAL STERNOTOMY;  Surgeon: [name], MD;  Location: [place];  Service: Thoracic;  Laterality: N/A;  . numerous hand surgery    . ORIF ANKLE FRACTURE Left 03/07/2020   Procedure: OPEN REDUCTION INTERNAL FIXATION (ORIF) ANKLE FRACTURE;  Surgeon: Caroline More, DPM;  Location: [facility];  Service: Podiatry;  Laterality: Left;  . 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   Procedure: RESECTION OF anterier MEDIASTINAL TUMOR;  Surgeon: [name], MD;  Location: [place];  Service: Thoracic;  Laterality: N/A;  . right hand fusion    . 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 TENDON REPAIR Left 03/07/2020   Procedure: FLEXOR TENDON REPAIR;  Surgeon: Caroline More, DPM;  Location: [facility];  Service: Podiatry;  Laterality: Left;  sleep apnea  . TONSILLECTOMY      Social History  reports that he has never smoked. He has never used smokeless tobacco. He reports current alcohol use of about 14.0 standard drinks of alcohol per week. He reports that he does not use drugs.  Allergies  Allergen Reactions  . Fentanyl Other (See Comments)    Possible involuntary tremors  .  Methocarbamol Other (See Comments)    confusion    Family History  Problem Relation Age of Onset  . Cancer Father 33       Bone   . COPD Father   . Heart disease Mother   . Hyperlipidemia Mother   . Stroke Mother   . Heart disease Maternal Grandfather   . Hyperlipidemia Maternal Grandfather   Reviewed on admission  Prior to Admission medications   Medication Sig Start Date End Date Taking? Authorizing Provider  acetaminophen (TYLENOL) 500 MG tablet Take 1,000 mg by mouth every 6 (six) hours as needed for  fever or headache.    [provider]  albuterol (VENTOLIN HFA) 108 (90 Base) MCG/ACT inhaler Inhale 2 puffs into the lungs every 6 (six) hours as needed for wheezing or shortness of breath.    [provider]  apixaban (ELIQUIS) 2.5 MG TABS tablet Take 1 tablet (2.5 mg total) by mouth 2 (two) times daily. 03/08/20   Caroline More, DPM  atorvastatin (LIPITOR) 80 MG tablet Take 80 mg by mouth at bedtime.  09/17/14 02/26/20  [provider]  BREO ELLIPTA 100-25 MCG/INH AEPB Inhale 1 puff into the lungs daily.  02/18/15   [provider]  budesonide (PULMICORT) 0.25 MG/2ML nebulizer solution Take 0.25 mg by nebulization daily as needed (shortness of breath).    [provider]  desvenlafaxine (PRISTIQ) 100 MG 24 hr tablet Take 100 mg by mouth daily.    [provider]  furosemide (LASIX) 20 MG tablet Take 20 mg by mouth daily as needed for edema.  03/17/19   [provider]  gabapentin (NEURONTIN) 400 MG capsule Take 400 mg by mouth 2 (two) times daily.    [provider]  gemfibrozil (LOPID) 600 MG tablet Take 600 mg by mouth 2 (two) times daily.  03/07/18 02/26/20  [provider]  lamoTRIgine (LAMICTAL) 100 MG tablet Take 100 mg by mouth at bedtime. 03/02/19   [provider]  lansoprazole (PREVACID) 30 MG capsule Take 30 mg by mouth daily. 03/22/19   [provider]  LORazepam (ATIVAN) 0.5 MG  tablet Take 0.5 mg by mouth 2 (two) times daily.  06/21/10   [provider]  metoprolol succinate (TOPROL-XL) 50 MG 24 hr tablet Take 50 mg by mouth 2 (two) times daily.  03/07/15   [provider]  montelukast (SINGULAIR) 10 MG tablet Take 10 mg by mouth at bedtime.    [provider]  NAMENDA XR 28 MG CP24 24 hr capsule Take 28 mg by mouth daily.  05/04/15   [provider]  nitroGLYCERIN (NITROLINGUAL) 0.4 MG/SPRAY spray Place 2 sprays under the tongue every 5 (five) minutes x 3 doses as needed for chest pain.  11/26/14   [provider]  olmesartan (BENICAR) 40 MG tablet Take 40 mg by mouth daily.  01/31/18   [provider]  traZODone (DESYREL) 100 MG tablet Take 100 mg by mouth at bedtime.  04/21/10   [provider]  valACYclovir (VALTREX) 1000 MG tablet Take 1,000 mg by mouth as needed (Fever blisters).  11/04/17   [provider]    Physical Exam: Vitals:   06/10/20 1646 06/10/20 1647 06/10/20 1846  BP:  126/68 (!) 100/47  Pulse:  69 64  Resp:  16 16  Temp:  98.6 F (37 C)   TempSrc:  Oral   SpO2:  96% 93%  Weight: 106.1 kg    Height: 5\' 7"  (1.702 m)     Physical Exam Constitutional:      General: He is not in acute distress.    Appearance: Normal appearance. He is obese.  HENT:     Head: Normocephalic and atraumatic.     Mouth/Throat:     Mouth: Mucous membranes are moist.     Pharynx: Oropharynx is clear.  Eyes:     Extraocular Movements: Extraocular movements intact.     Pupils: Pupils are equal, round, and reactive to light.  Cardiovascular:     Rate and Rhythm: Normal rate and regular rhythm.     Pulses: Normal pulses.  Heart sounds: Normal heart sounds.  Pulmonary:     Effort: Pulmonary effort is normal. No respiratory distress.     Breath sounds: Normal breath sounds.  Abdominal:     General: Bowel sounds are normal. There is no distension.     Palpations: Abdomen is soft.     Tenderness:  There is no abdominal tenderness.  Musculoskeletal:        General: No swelling or deformity.     Right lower leg: Edema present.     Left lower leg: Edema present.  Skin:    General: Skin is warm and dry.  Neurological:     General: No focal deficit present.     Mental Status: Mental status is at baseline.    Labs on Admission: I have personally reviewed following labs and imaging studies  CBC: Recent Labs  Lab 06/10/20 1653  WBC 4.6  NEUTROABS 1.8  HGB 13.6  HCT 37.8*  MCV 91.1  PLT 416    Basic Metabolic Panel: Recent Labs  Lab 06/10/20 1653  NA 126*  K 4.4  CL 86*  CO2 28  GLUCOSE 176*  BUN 51*  CREATININE 2.36*  CALCIUM 8.8*  MG 2.0    GFR: Estimated Creatinine Clearance: 31.9 mL/min (A) (by C-G formula based on SCr of 2.36 mg/dL (H)).  Liver Function Tests: Recent Labs  Lab 06/10/20 1653  AST 26  ALT 23  ALKPHOS 46  BILITOT 0.8  PROT 6.8  ALBUMIN 4.1    Urine analysis:    Component Value Date/Time   COLORURINE STRAW (A) 06/10/2020 1720   APPEARANCEUR CLEAR (A) 06/10/2020 1720   APPEARANCEUR Clear 03/22/2013 0840   LABSPEC 1.008 06/10/2020 1720   LABSPEC 1.011 03/22/2013 0840   PHURINE 6.0 06/10/2020 1720   GLUCOSEU NEGATIVE 06/10/2020 1720   GLUCOSEU Negative 03/22/2013 0840   HGBUR NEGATIVE 06/10/2020 1720   BILIRUBINUR NEGATIVE 06/10/2020 1720   BILIRUBINUR Negative 03/22/2013 0840   KETONESUR NEGATIVE 06/10/2020 1720   PROTEINUR NEGATIVE 06/10/2020 1720   NITRITE NEGATIVE 06/10/2020 1720   LEUKOCYTESUR NEGATIVE 06/10/2020 1720   LEUKOCYTESUR Negative 03/22/2013 0840    Radiological Exams on Admission: CT ABDOMEN PELVIS WO CONTRAST  Result Date: 06/10/2020 CLINICAL DATA:  Abdominal swelling. EXAM: CT ABDOMEN AND PELVIS WITHOUT CONTRAST TECHNIQUE: Multidetector CT imaging of the abdomen and pelvis was performed following the standard protocol without IV contrast. COMPARISON:  February 25, 2018. FINDINGS: Lower chest: No acute  abnormality. Hepatobiliary: No focal liver abnormality is seen. No gallstones, gallbladder wall thickening, or biliary dilatation. Pancreas: Unremarkable. No pancreatic ductal dilatation or surrounding inflammatory changes. Spleen: Normal in size without focal abnormality. Adrenals/Urinary Tract: Adrenal glands are unremarkable. Kidneys are normal, without renal calculi, focal lesion, or hydronephrosis. Bladder is unremarkable. Stomach/Bowel: Stomach is within normal limits. Appendix appears normal. No evidence of bowel wall thickening, distention, or inflammatory changes. Vascular/Lymphatic: Aortic atherosclerosis. No enlarged abdominal or pelvic lymph nodes. Reproductive: Prostate is unremarkable. Other: Moderate size fat containing right inguinal hernia is noted. No ascites is noted. Musculoskeletal: Postsurgical and degenerative changes are noted in the lower lumbar spine. No acute abnormality is noted. IMPRESSION: No acute abnormality seen in the abdomen or pelvis. Moderate size fat containing right inguinal hernia is noted. Aortic Atherosclerosis (ICD10-I70.0). Electronically Signed   By: Marijo Conception M.D.   On: 06/10/2020 14:32   DG Chest 2 View  Result Date: 06/10/2020 CLINICAL DATA:  Shortness of breath EXAM: CHEST - 2 VIEW COMPARISON:  CT 10/10/2018, radiograph  02/13/2016 FINDINGS: No focal airspace disease or effusion. Upper sternotomy changes. Bronchitic changes. Stable cardiomediastinal silhouette. No pneumothorax. IMPRESSION: No active cardiopulmonary disease.  Bronchitic changes Electronically Signed   By: Donavan Foil M.D.   On: 06/10/2020 18:50   EKG: Independently reviewed.  Normal sinus rhythm at 65 bpm.  Right bundle branch block.  Nonspecific T wave abnormalities in inferior leads.  Similar to previous.  Assessment/Plan Principal Problem:   AKI (acute kidney injury) ([place]) Active Problems:   Anxiety   Acid reflux   Combined fat and carbohydrate induced hyperlipemia   Benign  essential HTN   CAD (coronary artery disease)   Clinical depression   History of CVA (cerebrovascular accident)   Lymphedema   Chronic venous insufficiency   Hyponatremia   History of seizures  AKI Moderate Hyponatremia > Creatinine elevated to 2.36 from baseline of around 0.9 after increasing diuresis outpatient which was performed for lower extremity edema. > Moderate hyponatremia with sodium 126 in the ED in the setting of diuresis and AKI as above.  No difficulty with mentation.  Does describe some blurry vision and lightheadedness likely related to his dehydration. > CT abdomen without ascites or hydronephrosis in ED.  Postvoid residual normal in ED. > Will replete sodium and fluids with normal saline at 150 cc/h.  Will monitor BMP every 4 hours to ensure sodium does not overcorrect. - Monitor on telemetry - BMP every 4 hours - Check Serum Osm, Ur studies likely to be inaccurate given diuretic use - Normal saline at 150 cc/h - Avoid nephrotoxic agents  GERD - Continue PPI  Anxiety and depression - Continue home venlafaxine, as needed Ativan  Hypertension - Holding home olmesartan in the setting of AKI  Venous insufficiency Lymphedema > Is worsening lower extremity edema likely due to this may not be able to be fully managed with diuretics alone. - Continue to monitor   History of seizure - Continue home gabapentin and Lamictal  Asthma - Continue home Breo - Continue as needed budesonide and albuterol - Continue home weakness  History of CVA Hyperlipidemia - He is currently holding his home atorvastatin and Lopid due to muscle aches  - Continue home ASA (he had been on Eliquis for postprocedural prophylaxis after ankle surgery but he states this has been stopped.)  CAD - Continue home ASA - metoprolol - Atorvastatin and Lopid on hold as above  DVT prophylaxis: Lovenox Code Status:   Full  Family Communication:  Wife updated at bedside Disposition Plan:    Patient is from:  Home  Anticipated DC to:  Home  Anticipated DC date:  1 to 4 days  Anticipated DC barriers: None  Consults called:  None Admission status:  Observation, MedSurg with telemetry   Severity of Illness: The appropriate patient status for this patient is OBSERVATION. Observation status is judged to be reasonable and necessary in order to provide the required intensity of service to ensure the patient's safety. The patient's presenting symptoms, physical exam findings, and initial radiographic and laboratory data in the context of their medical condition is felt to place them at decreased risk for further clinical deterioration. Furthermore, it is anticipated that the patient will be medically stable for discharge from the hospital within 2 midnights of admission. The following factors support the patient status of observation.   " The patient's presenting symptoms include edema, abnormal labs. " The physical exam findings include mild lower extremity swelling. " The initial radiographic and laboratory data are creatinine 2.36  from baseline 0.9, sodium 126, glucose 176, normal BMP, CTA without acute unreality but does show moderate fat-containing right inguinal hernia.Marcelyn Bruins MD [facility]  How to contact the [facility] Attending or Consulting provider [place] or covering provider during after hours [place], for this patient?   1. Check the care team in [facility] and look for a) attending/consulting [facility] provider listed and b) the [facility] team listed 2. Log into [email or web address] and use [place]'s universal password to access. If you do not have the password, please contact the hospital operator. 3. Locate the [facility] provider you are looking for under [facility] and page to a number that you can be directly reached. 4. If you still have difficulty reaching the provider, please page the [facility] (Director on Call) for the Hospitalists listed on amion for  assistance.  06/10/2020, 7:44 PM

## 2020-06-11 DIAGNOSIS — G8929 Other chronic pain: Secondary | ICD-10-CM | POA: Diagnosis present

## 2020-06-11 DIAGNOSIS — Z20822 Contact with and (suspected) exposure to covid-19: Secondary | ICD-10-CM | POA: Diagnosis present

## 2020-06-11 DIAGNOSIS — R569 Unspecified convulsions: Secondary | ICD-10-CM | POA: Diagnosis present

## 2020-06-11 DIAGNOSIS — N179 Acute kidney failure, unspecified: Secondary | ICD-10-CM | POA: Diagnosis present

## 2020-06-11 DIAGNOSIS — K4090 Unilateral inguinal hernia, without obstruction or gangrene, not specified as recurrent: Secondary | ICD-10-CM | POA: Diagnosis present

## 2020-06-11 DIAGNOSIS — E669 Obesity, unspecified: Secondary | ICD-10-CM | POA: Diagnosis present

## 2020-06-11 DIAGNOSIS — E860 Dehydration: Secondary | ICD-10-CM | POA: Diagnosis present

## 2020-06-11 DIAGNOSIS — E871 Hypo-osmolality and hyponatremia: Secondary | ICD-10-CM | POA: Diagnosis present

## 2020-06-11 DIAGNOSIS — I890 Lymphedema, not elsewhere classified: Secondary | ICD-10-CM | POA: Diagnosis present

## 2020-06-11 DIAGNOSIS — I4510 Unspecified right bundle-branch block: Secondary | ICD-10-CM | POA: Diagnosis present

## 2020-06-11 DIAGNOSIS — M7989 Other specified soft tissue disorders: Secondary | ICD-10-CM | POA: Diagnosis present

## 2020-06-11 DIAGNOSIS — G4730 Sleep apnea, unspecified: Secondary | ICD-10-CM | POA: Diagnosis present

## 2020-06-11 DIAGNOSIS — M1990 Unspecified osteoarthritis, unspecified site: Secondary | ICD-10-CM | POA: Diagnosis present

## 2020-06-11 DIAGNOSIS — I10 Essential (primary) hypertension: Secondary | ICD-10-CM | POA: Diagnosis present

## 2020-06-11 DIAGNOSIS — J45909 Unspecified asthma, uncomplicated: Secondary | ICD-10-CM | POA: Diagnosis present

## 2020-06-11 DIAGNOSIS — K219 Gastro-esophageal reflux disease without esophagitis: Secondary | ICD-10-CM | POA: Diagnosis present

## 2020-06-11 DIAGNOSIS — G250 Essential tremor: Secondary | ICD-10-CM | POA: Diagnosis present

## 2020-06-11 DIAGNOSIS — I872 Venous insufficiency (chronic) (peripheral): Secondary | ICD-10-CM | POA: Diagnosis present

## 2020-06-11 DIAGNOSIS — F419 Anxiety disorder, unspecified: Secondary | ICD-10-CM | POA: Diagnosis not present

## 2020-06-11 DIAGNOSIS — I2510 Atherosclerotic heart disease of native coronary artery without angina pectoris: Secondary | ICD-10-CM | POA: Diagnosis present

## 2020-06-11 DIAGNOSIS — E781 Pure hyperglyceridemia: Secondary | ICD-10-CM | POA: Diagnosis present

## 2020-06-11 DIAGNOSIS — E785 Hyperlipidemia, unspecified: Secondary | ICD-10-CM | POA: Diagnosis present

## 2020-06-11 DIAGNOSIS — G4700 Insomnia, unspecified: Secondary | ICD-10-CM | POA: Diagnosis present

## 2020-06-11 DIAGNOSIS — M549 Dorsalgia, unspecified: Secondary | ICD-10-CM | POA: Diagnosis present

## 2020-06-11 DIAGNOSIS — F418 Other specified anxiety disorders: Secondary | ICD-10-CM | POA: Diagnosis present

## 2020-06-11 DIAGNOSIS — Z6836 Body mass index (BMI) 36.0-36.9, adult: Secondary | ICD-10-CM | POA: Diagnosis not present

## 2020-06-11 LAB — BASIC METABOLIC PANEL
Anion gap: 7 (ref 5–15)
Anion gap: 9 (ref 5–15)
Anion gap: 7 (ref 5–15)
BUN: 45 mg/dL — ABNORMAL HIGH (ref 8–23)
BUN: 42 mg/dL — ABNORMAL HIGH (ref 8–23)
BUN: 36 mg/dL — ABNORMAL HIGH (ref 8–23)
CO2: 29 mmol/L (ref 22–32)
CO2: 28 mmol/L (ref 22–32)
CO2: 28 mmol/L (ref 22–32)
Calcium: 8.8 mg/dL — ABNORMAL LOW (ref 8.9–10.3)
Calcium: 9.1 mg/dL (ref 8.9–10.3)
Calcium: 9 mg/dL (ref 8.9–10.3)
Chloride: 94 mmol/L — ABNORMAL LOW (ref 98–111)
Chloride: 94 mmol/L — ABNORMAL LOW (ref 98–111)
Chloride: 95 mmol/L — ABNORMAL LOW (ref 98–111)
Creatinine, Ser: 1.83 mg/dL — ABNORMAL HIGH (ref 0.61–1.24)
Creatinine, Ser: 1.58 mg/dL — ABNORMAL HIGH (ref 0.61–1.24)
Creatinine, Ser: 1.58 mg/dL — ABNORMAL HIGH (ref 0.61–1.24)
GFR, Estimated: 38 mL/min — ABNORMAL LOW (ref >60)
GFR, Estimated: 46 mL/min — ABNORMAL LOW (ref >60)
GFR, Estimated: 46 mL/min — ABNORMAL LOW (ref >60)
Glucose, Bld: 169 mg/dL — ABNORMAL HIGH (ref 70–99)
Glucose, Bld: 151 mg/dL — ABNORMAL HIGH (ref 70–99)
Glucose, Bld: 145 mg/dL — ABNORMAL HIGH (ref 70–99)
Potassium: 4.7 mmol/L (ref 3.5–5.1)
Potassium: 5 mmol/L (ref 3.5–5.1)
Potassium: 5.1 mmol/L (ref 3.5–5.1)
Sodium: 130 mmol/L — ABNORMAL LOW (ref 135–145)
Sodium: 131 mmol/L — ABNORMAL LOW (ref 135–145)
Sodium: 130 mmol/L — ABNORMAL LOW (ref 135–145)

## 2020-06-11 LAB — CBC
HCT: 37.1 % — ABNORMAL LOW (ref 39.0–52.0)
Hemoglobin: 13.3 g/dL (ref 13.0–17.0)
MCH: 32.4 pg (ref 26.0–34.0)
MCHC: 35.8 g/dL (ref 30.0–36.0)
MCV: 90.5 fL (ref 80.0–100.0)
Platelets: 132 10*3/uL — ABNORMAL LOW (ref 150–400)
RBC: 4.1 MIL/uL — ABNORMAL LOW (ref 4.22–5.81)
RDW: 12.8 % (ref 11.5–15.5)
WBC: 3.5 10*3/uL — ABNORMAL LOW (ref 4.0–10.5)
nRBC: 0 % (ref 0.0–0.2)

## 2020-06-11 LAB — SARS CORONAVIRUS 2 (TAT 6-24 HRS): SARS Coronavirus 2: NEGATIVE

## 2020-06-11 MED ORDER — ROSUVASTATIN CALCIUM 10 MG PO TABS
10.0000 mg | ORAL_TABLET | Freq: Every day | ORAL | Status: DC
  Filled 2020-06-11: qty 1

## 2020-06-11 MED ORDER — ENOXAPARIN SODIUM 60 MG/0.6ML ~~LOC~~ SOLN
0.5000 mg/kg | SUBCUTANEOUS | Status: DC
Start: 2020-06-11 — End: 2020-06-12
  Administered 2020-06-11: 52.5 mg via SUBCUTANEOUS
  Filled 2020-06-11: qty 0.6

## 2020-06-11 MED ORDER — MEMANTINE HCL ER 28 MG PO CP24
28.0000 mg | ORAL_CAPSULE | Freq: Every day | ORAL | Status: DC
  Administered 2020-06-11 – 2020-06-12 (×2): 28 mg via ORAL
  Filled 2020-06-11 (×2): qty 1

## 2020-06-11 MED ORDER — TRAZODONE HCL 100 MG PO TABS
100.0000 mg | ORAL_TABLET | Freq: Every day | ORAL | Status: DC
  Administered 2020-06-11: 100 mg via ORAL
  Filled 2020-06-11: qty 1

## 2020-06-11 NOTE — Plan of Care (Signed)
  Problem: Clinical Measurements: Goal: Ability to maintain clinical measurements within normal limits will improve Outcome: Progressing Goal: Will remain free from infection Outcome: Progressing Goal: Diagnostic test results will improve Outcome: Progressing Goal: Respiratory complications will improve Outcome: Progressing Goal: Cardiovascular complication will be avoided Outcome: Progressing   Problem: Activity: Goal: Risk for activity intolerance will decrease Outcome: Progressing   Problem: Nutrition: Goal: Adequate nutrition will be maintained Outcome: Progressing   Problem: Coping: Goal: Level of anxiety will decrease Outcome: Progressing   Problem: Elimination: Goal: Will not experience complications related to bowel motility Outcome: Progressing Goal: Will not experience complications related to urinary retention Outcome: Progressing   Problem: Pain Managment: Goal: General experience of comfort will improve Outcome: Progressing   Problem: Skin Integrity: Goal: Risk for impaired skin integrity will decrease Outcome: Progressing   

## 2020-06-11 NOTE — Plan of Care (Signed)

## 2020-06-11 NOTE — Progress Notes (Addendum)
Progress Note    Johnny Mccoy  JKD:[id number] DOB: [birth year]  DOA: 06/10/2020 PCP: Idelle Crouch, MD    Brief Narrative:     Medical records reviewed and are as summarized below:  Johnny Mccoy is an 75 y.o. male with medical history significant of GERD, anxiety, depression, CAD, hyperlipidemia, hypertension, right bundle branch block, venous insufficiency, lymphedema, CVA, asthma, cataracts, seizure who presents with concerns for AKI on recent labs by PCP.  HE has been given increasing amounts of diuretics outpatient.  Has echo and 24 hour urine done as an outpatient already so unclear the source of the swelling.  Improved with IVF and elevation in ER.  Assessment/Plan:   Principal Problem:   AKI (acute kidney injury) ([place]) Active Problems:   Anxiety   Acid reflux   Combined fat and carbohydrate induced hyperlipemia   Benign essential HTN   CAD (coronary artery disease)   Clinical depression   History of CVA (cerebrovascular accident)   Lymphedema   Chronic venous insufficiency   Hyponatremia   History of seizures   AKI/Moderate Hyponatremia > Creatinine elevated to 2.36 from baseline of around 0.9 after increasing diuresis outpatient which was performed for lower extremity edema. > Moderate hyponatremia with sodium 126 in the ED in the setting of diuresis and AKI as above.  No difficulty with mentation.  Does describe some blurry vision and lightheadedness likely related to his dehydration. > CT abdomen without ascites or hydronephrosis in ED.  Postvoid residual normal in ED. -trend Na -d/c IVF to avoid volume overload - Avoid nephrotoxic agents- had been on increasing amount diuretics but unclear that he was volume overloaded -albumin normal -TSH/echo/U/A unrevealing  Venous insufficiency Lymphedema >  worsening lower extremity edema likely due to this may not be able to be fully managed with diuretics alone. -TED hose vs UNNA boots-- much improved with  elevation overnight - Continue to monitor  -U/A/ 24 hour: no protein -TSH outpatient normal -echo outpatient- preserved EF and no RHF per report  GERD - Continue PPI  Anxiety and depression - Continue home venlafaxine, as needed Ativan  Hypertension - Holding home olmesartan in the setting of AKI  History of seizure - Continue home gabapentin and Lamictal  Asthma - Continue home Breo - Continue as needed budesonide and albuterol  History of CVA Hyperlipidemia - He is currently holding his home atorvastatin and Lopid due to muscle aches  - Continue home ASA (he had been on Eliquis for postprocedural prophylaxis after ankle surgery but he states this has been stopped.)  CAD - Continue home ASA - metoprolol - Atorvastatin and Lopid on hold as above  obesity Body mass index is 36.65 kg/m.   Family Communication/Anticipated D/C date and plan/Code Status   DVT prophylaxis: Lovenox ordered. Code Status: Full Code.  Disposition Plan: Status is: Observation  The patient will require care spanning > 2 midnights and should be moved to inpatient because: Inpatient level of care appropriate due to severity of illness  Dispo: The patient is from: Home              Anticipated d/c is to: Home              Patient currently is not medically stable to d/c.   Difficult to place patient No         Medical Consultants:    None.     Subjective:   Has been having SOB And abdominal swelling as well  as LE swelling-- time line not clear but outpatient he was given IV lasix and increasing amounts of diuretics -still c/o SOB  Objective:    Vitals:   06/10/20 2057 06/11/20 0022 06/11/20 0401 06/11/20 0846  BP: (!) 152/67 (!) 134/52 129/64 (!) 149/69  Pulse: (!) 59 (!) 57 64 (!) 57  Resp: 18 17 18 16   Temp: 97.7 F (36.5 C) 97.9 F (36.6 C) 98.1 F (36.7 C) 97.8 F (36.6 C)  TempSrc: Oral     SpO2: 95% 96% 97% 95%  Weight:      Height:         Intake/Output Summary (Last 24 hours) at 06/11/2020 1126 Last data filed at 06/11/2020 1024 Gross per 24 hour  Intake 1130 ml  Output 3100 ml  Net -1970 ml   Filed Weights   06/10/20 1646  Weight: 106.1 kg    Exam:  General: Appearance:    Obese male in no acute distress     Lungs:     respirations unlabored  Heart:    Bradycardic. Normal rhythm. No murmurs, rubs, or gallops.   MS:   All extremities are intact.  +LE edema but non-pitting  Neurologic:   Awake, alert, oriented x 3.    Data Reviewed:   I have personally reviewed following labs and imaging studies:  Labs: Labs show the following:   Basic Metabolic Panel: Recent Labs  Lab 06/10/20 1653 06/10/20 2149 06/11/20 0056 06/11/20 0520 06/11/20 0852  NA 126* 131* 130* 131* 130*  K 4.4 4.7 4.7 5.0 5.1  CL 86* 92* 94* 94* 95*  CO2 28 29 29 28 28   GLUCOSE 176* 182* 169* 151* 145*  BUN 51* 47* 45* 42* 36*  CREATININE 2.36* 1.99* 1.83* 1.58* 1.58*  CALCIUM 8.8* 8.8* 8.8* 9.1 9.0  MG 2.0  --   --   --   --    GFR Estimated Creatinine Clearance: 47.6 mL/min (A) (by C-G formula based on SCr of 1.58 mg/dL (H)). Liver Function Tests: Recent Labs  Lab 06/10/20 1653  AST 26  ALT 23  ALKPHOS 46  BILITOT 0.8  PROT 6.8  ALBUMIN 4.1   No results for input(s): LIPASE, AMYLASE in the last 168 hours. No results for input(s): AMMONIA in the last 168 hours. Coagulation profile No results for input(s): INR, PROTIME in the last 168 hours.  CBC: Recent Labs  Lab 06/10/20 1653 06/11/20 0520  WBC 4.6 3.5*  NEUTROABS 1.8  --   HGB 13.6 13.3  HCT 37.8* 37.1*  MCV 91.1 90.5  PLT 153 132*   Cardiac Enzymes: No results for input(s): CKTOTAL, CKMB, CKMBINDEX, TROPONINI in the last 168 hours. BNP (last 3 results) No results for input(s): PROBNP in the last 8760 hours. CBG: No results for input(s): GLUCAP in the last 168 hours. D-Dimer: No results for input(s): DDIMER in the last 72 hours. Hgb A1c: No  results for input(s): HGBA1C in the last 72 hours. Lipid Profile: No results for input(s): CHOL, HDL, LDLCALC, TRIG, CHOLHDL, LDLDIRECT in the last 72 hours. Thyroid function studies: No results for input(s): TSH, T4TOTAL, T3FREE, THYROIDAB in the last 72 hours.  Invalid input(s): FREET3 Anemia work up: No results for input(s): VITAMINB12, FOLATE, FERRITIN, TIBC, IRON, RETICCTPCT in the last 72 hours. Sepsis Labs: Recent Labs  Lab 06/10/20 1653 06/11/20 0520  WBC 4.6 3.5*    Microbiology Recent Results (from the past 240 hour(s))  SARS CORONAVIRUS 2 (TAT 6-24 HRS) Nasopharyngeal Nasopharyngeal Swab  Status: None   Collection Time: 06/10/20  5:45 PM   Specimen: Nasopharyngeal Swab  Result Value Ref Range Status   SARS Coronavirus 2 NEGATIVE NEGATIVE Final    Comment: (NOTE) SARS-CoV-2 target nucleic acids are NOT DETECTED.  The SARS-CoV-2 RNA is generally detectable in upper and lower respiratory specimens during the acute phase of infection. Negative results do not preclude SARS-CoV-2 infection, do not rule out co-infections with other pathogens, and should not be used as the sole basis for treatment or other patient management decisions. Negative results must be combined with clinical observations, patient history, and epidemiological information. The expected result is Negative.  Fact Sheet for Patients: SugarRoll.be  Fact Sheet for Healthcare Providers: https://www.woods-mathews.com/  This test is not yet approved or cleared by the [place] FDA and  has been authorized for detection and/or diagnosis of SARS-CoV-2 by FDA under an Emergency Use Authorization (EUA). This EUA will remain  in effect (meaning this test can be used) for the duration of the COVID-19 declaration under Se ction 564(b)(1) of the Act, 21 U.S.C. section 360bbb-3(b)(1), unless the authorization is terminated or revoked sooner.  Performed at [facility], [place] [address] [place], [place] [address]     Procedures and diagnostic studies:  CT ABDOMEN PELVIS WO CONTRAST  Result Date: 06/10/2020 CLINICAL DATA:  Abdominal swelling. EXAM: CT ABDOMEN AND PELVIS WITHOUT CONTRAST TECHNIQUE: Multidetector CT imaging of the abdomen and pelvis was performed following the standard protocol without IV contrast. COMPARISON:  February 25, 2018. FINDINGS: Lower chest: No acute abnormality. Hepatobiliary: No focal liver abnormality is seen. No gallstones, gallbladder wall thickening, or biliary dilatation. Pancreas: Unremarkable. No pancreatic ductal dilatation or surrounding inflammatory changes. Spleen: Normal in size without focal abnormality. Adrenals/Urinary Tract: Adrenal glands are unremarkable. Kidneys are normal, without renal calculi, focal lesion, or hydronephrosis. Bladder is unremarkable. Stomach/Bowel: Stomach is within normal limits. Appendix appears normal. No evidence of bowel wall thickening, distention, or inflammatory changes. Vascular/Lymphatic: Aortic atherosclerosis. No enlarged abdominal or pelvic lymph nodes. Reproductive: Prostate is unremarkable. Other: Moderate size fat containing right inguinal hernia is noted. No ascites is noted. Musculoskeletal: Postsurgical and degenerative changes are noted in the lower lumbar spine. No acute abnormality is noted. IMPRESSION: No acute abnormality seen in the abdomen or pelvis. Moderate size fat containing right inguinal hernia is noted. Aortic Atherosclerosis (ICD10-I70.0). Electronically Signed   By: Marijo Conception M.D.   On: 06/10/2020 14:32   DG Chest 2 View  Result Date: 06/10/2020 CLINICAL DATA:  Shortness of breath EXAM: CHEST - 2 VIEW COMPARISON:  CT 10/10/2018, radiograph 02/13/2016 FINDINGS: No focal airspace disease or effusion. Upper sternotomy changes. Bronchitic changes. Stable cardiomediastinal silhouette. No pneumothorax. IMPRESSION: No active cardiopulmonary disease.   Bronchitic changes Electronically Signed   By: Donavan Foil M.D.   On: 06/10/2020 18:50    Medications:   . aspirin EC  81 mg Oral Daily  . enoxaparin (LOVENOX) injection  0.5 mg/kg Subcutaneous Q24H  . fluticasone furoate-vilanterol  1 puff Inhalation Daily  . gabapentin  200 mg Oral BID  . lamoTRIgine  100 mg Oral QHS  . memantine  28 mg Oral Daily  . metoprolol succinate  50 mg Oral BID  . montelukast  10 mg Oral QHS  . pantoprazole  40 mg Oral Daily  . sodium chloride flush  3 mL Intravenous Q12H  . traZODone  100 mg Oral QHS  . venlafaxine XR  150 mg Oral Q breakfast   Continuous  Infusions:   LOS: 0 days   Geradine Girt  [facility]   How to contact the [facility] Attending or Consulting provider [place] or covering provider during after hours [place], for this patient?  1. Check the care team in [facility] and look for a) attending/consulting [facility] provider listed and b) the [facility] team listed 2. Log into [email or web address] and use [place]'s universal password to access. If you do not have the password, please contact the hospital operator. 3. Locate the [facility] provider you are looking for under [facility] and page to a number that you can be directly reached. 4. If you still have difficulty reaching the provider, please page the [facility] (Director on Call) for the Hospitalists listed on amion for assistance.  06/11/2020, 11:26 AM

## 2020-06-11 NOTE — Progress Notes (Signed)
Anticoagulation monitoring(Lovenox):  75yo  M ordered Lovenox 40 mg Q24h  Filed Weights   06/10/20 1646  Weight: 106.1 kg (234 lb)   BMI 36.6   Lab Results  Component Value Date   CREATININE 1.58 (H) 06/11/2020   CREATININE 1.83 (H) 06/11/2020   CREATININE 1.99 (H) 06/10/2020   Estimated Creatinine Clearance: 47.6 mL/min (A) (by C-G formula based on SCr of 1.58 mg/dL (H)). Hemoglobin & Hematocrit     Component Value Date/Time   HGB 13.3 06/11/2020 0520   HGB 11.0 (L) 04/10/2013 1711   HCT 37.1 (L) 06/11/2020 0520   HCT 30.9 (L) 04/10/2013 1711     Per Protocol for Patient with estCrcl > 30 ml/min and BMI > 30, will transition to Lovenox 0.5 mg/kg Q24h.     Chinita Greenland PharmD Clinical Pharmacist 06/11/2020

## 2020-06-12 DIAGNOSIS — F419 Anxiety disorder, unspecified: Secondary | ICD-10-CM

## 2020-06-12 DIAGNOSIS — F32A Depression, unspecified: Secondary | ICD-10-CM

## 2020-06-12 DIAGNOSIS — I872 Venous insufficiency (chronic) (peripheral): Secondary | ICD-10-CM

## 2020-06-12 DIAGNOSIS — Z87898 Personal history of other specified conditions: Secondary | ICD-10-CM

## 2020-06-12 LAB — BASIC METABOLIC PANEL
Anion gap: 10 (ref 5–15)
BUN: 27 mg/dL — ABNORMAL HIGH (ref 8–23)
CO2: 26 mmol/L (ref 22–32)
Calcium: 9.8 mg/dL (ref 8.9–10.3)
Chloride: 94 mmol/L — ABNORMAL LOW (ref 98–111)
Creatinine, Ser: 1.09 mg/dL (ref 0.61–1.24)
GFR, Estimated: >60 mL/min (ref >60)
Glucose, Bld: 140 mg/dL — ABNORMAL HIGH (ref 70–99)
Potassium: 4.8 mmol/L (ref 3.5–5.1)
Sodium: 130 mmol/L — ABNORMAL LOW (ref 135–145)

## 2020-06-12 LAB — LIPID PANEL
Cholesterol: 304 mg/dL — ABNORMAL HIGH (ref 0–200)
HDL: 33 mg/dL — ABNORMAL LOW (ref >40)
LDL Cholesterol: UNDETERMINED mg/dL (ref 0–99)
Total CHOL/HDL Ratio: 9.2 RATIO
Triglycerides: 632 mg/dL — ABNORMAL HIGH (ref <150)
VLDL: UNDETERMINED mg/dL (ref 0–40)

## 2020-06-12 LAB — CBC
HCT: 38.4 % — ABNORMAL LOW (ref 39.0–52.0)
Hemoglobin: 13.7 g/dL (ref 13.0–17.0)
MCH: 32.5 pg (ref 26.0–34.0)
MCHC: 35.7 g/dL (ref 30.0–36.0)
MCV: 91.2 fL (ref 80.0–100.0)
Platelets: 144 10*3/uL — ABNORMAL LOW (ref 150–400)
RBC: 4.21 MIL/uL — ABNORMAL LOW (ref 4.22–5.81)
RDW: 12.8 % (ref 11.5–15.5)
WBC: 4.7 10*3/uL (ref 4.0–10.5)
nRBC: 0 % (ref 0.0–0.2)

## 2020-06-12 LAB — LDL CHOLESTEROL, DIRECT: Direct LDL: 88.5 mg/dL (ref 0–99)

## 2020-06-12 MED ORDER — POLYETHYLENE GLYCOL 3350 17 G PO PACK: 17.0000 g | PACK | Freq: Every day | ORAL | 0 refills | Status: DC | PRN

## 2020-06-12 MED ORDER — GABAPENTIN 400 MG PO CAPS: 400.0000 mg | ORAL_CAPSULE | Freq: Every day | ORAL | Status: DC

## 2020-06-12 MED ORDER — TAMSULOSIN HCL 0.4 MG PO CAPS: 0.4000 mg | ORAL_CAPSULE | Freq: Every day | ORAL | 0 refills | Status: DC

## 2020-06-12 MED ORDER — TAMSULOSIN HCL 0.4 MG PO CAPS: 0.4000 mg | ORAL_CAPSULE | Freq: Every day | ORAL | Status: DC

## 2020-06-12 NOTE — Progress Notes (Signed)
Pt provided discharge instructions. All questions and concerns addressed by RN at this time. Pts belongings returned and pt is ready for discharge. Method of transport at discharge is wheelchair accompanied by his wife.   06/12/20 0758  Vitals  Temp 98.5 F (36.9 C)  BP 139/68  MAP (mmHg) 89  BP Location Right Arm  BP Method Automatic  Patient Position (if appropriate) Lying  Pulse Rate 66  Pulse Rate Source Monitor  Resp 16  MEWS COLOR  MEWS Score Color Green  Oxygen Therapy  SpO2 94 %  O2 Device Room Air  Pain Assessment  Pain Scale 0-10  Pain Score 0    06/12/20 0758  Vitals  Temp 98.5 F (36.9 C)  BP 139/68  MAP (mmHg) 89  BP Location Right Arm  BP Method Automatic  Patient Position (if appropriate) Lying  Pulse Rate 66  Pulse Rate Source Monitor  Resp 16  MEWS COLOR  MEWS Score Color Green  Oxygen Therapy  SpO2 94 %  O2 Device Room Air  Pain Assessment  Pain Scale 0-10  Pain Score 0

## 2020-06-12 NOTE — Discharge Summary (Signed)
[facility] NAME: Johnny Mccoy    MR#:  [id number]  DATE OF BIRTH:  December 10, 1945  DATE OF ADMISSION:  06/10/2020 ADMITTING PHYSICIAN: Geradine Girt, DO  DATE OF DISCHARGE: 06/12/2020 10:13 AM  PRIMARY CARE PHYSICIAN: Idelle Crouch, MD    ADMISSION DIAGNOSIS:  AKI (acute kidney injury) ([place]) [N17.9]  DISCHARGE DIAGNOSIS:  Principal Problem:   AKI (acute kidney injury) ([place]) Active Problems:   Anxiety   Acid reflux   Combined fat and carbohydrate induced hyperlipemia   Benign essential HTN   CAD (coronary artery disease)   Clinical depression   History of CVA (cerebrovascular accident)   Lymphedema   Chronic venous insufficiency   Hyponatremia   History of seizures   SECONDARY DIAGNOSIS:   Past Medical History:  Diagnosis Date  . Anxiety    takes Lorazepam daily  . Arthritis   . Asthma    Albuterol as needed.Breo daily as well as Flovent  . Blood clot in vein    after r knee replacement.  filter placed and removed.  [place] CAD (coronary artery disease)   . Cataract   . Cataracts, bilateral    immature  . Chronic back pain    compression  . Complication of anesthesia    seizures after partial knee in 2014.Surgery was in am and an still in post op until 9 that night. Was sent to [facility] for 5 days d/t uncontrollable shaking,incoherant (02/26/20 pt states was tremors from fentanyl)  . COVID-19 05/2018  . Depressed    takes Pristiq daily  . Essential tremor   . GERD (gastroesophageal reflux disease)    takes Dexilant daily as well as Zantac  . 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 History of bronchitis    Jan-Mar 2017  . History of hiatal hernia   . Hyperlipidemia    takes Atorvastatin daily  . Hypertension    takes HCTZ,Amlodipine,and Metoprolol daily  . Insomnia    takes Trazodone nightly   . Joint pain   . Muscle spasm    in neck.Takes Flexeril as needed  . Numbness    right knee,right hand,and toes  on left  . Seizures (HCC)    [place]. takes Lamotrigine daily. Last seizure 6+ yrs ago  . Sleep apnea    CPAP    HOSPITAL COURSE:   1.  Acute kidney injury on presentation with a creatinine of 2.36.  With IV fluid hydration creatinine came down to 1.07.  Continue to hold water pills at the time of discharge.  Continue Flomax to help out with urination. 2.  Chronic hyponatremia.  Continue to watch as outpatient. 3.  Venous insufficiency and lymphedema.  The patient states that he has TED hose at home and he will go back to wearing that. 4.  Anxiety depression continue Effexor and as needed Ativan 5.  Essential hypertension continue to hold telmisartan since he is recovering from acute kidney injury.  Continue Toprol. 6.  History of seizure continue lower dose gabapentin and Lamictal 7.  Asthma continue home Breo inhaler 8.  Hyperlipidemia unspecified, hypertriglyceridemia.  Patient is currently holding atorvastatin and Lopid secondary to muscle aches. Can consider [facility] instead as outpatient. 9.  Prior history of nonhemorrhagic CVA.  On aspirin 10.  Difficulty getting his urine out empirically started on Flomax.  Patient also said he had a drop of blood in his underwear.  Will refer  to urology as outpatient.   DISCHARGE CONDITIONS:   Satisfactory  CONSULTS OBTAINED:  None  DRUG ALLERGIES:   Allergies  Allergen Reactions  . Fentanyl Other (See Comments)    Possible involuntary tremors  . Methocarbamol Other (See Comments)    confusion    DISCHARGE MEDICATIONS:   Allergies as of 06/12/2020      Reactions   Fentanyl Other (See Comments)   Possible involuntary tremors   Methocarbamol Other (See Comments)   confusion      Medication List    STOP taking these medications   acetaminophen 500 MG tablet Commonly known as: TYLENOL   apixaban 2.5 MG Tabs tablet Commonly known as: Eliquis   olmesartan 40 MG tablet Commonly known as: BENICAR     TAKE these medications    albuterol 108 (90 Base) MCG/ACT inhaler Commonly known as: VENTOLIN HFA Inhale 2 puffs into the lungs every 6 (six) hours as needed for wheezing or shortness of breath.   aspirin EC 81 MG tablet Take 81 mg by mouth daily. Swallow whole.   Breo Ellipta 100-25 MCG/INH Aepb Generic drug: fluticasone furoate-vilanterol Inhale 1 puff into the lungs daily.   budesonide 0.25 MG/2ML nebulizer solution Commonly known as: PULMICORT Take 0.25 mg by nebulization daily as needed (shortness of breath).   desvenlafaxine 100 MG 24 hr tablet Commonly known as: PRISTIQ Take 100 mg by mouth daily.   gabapentin 400 MG capsule Commonly known as: NEURONTIN Take 1 capsule (400 mg total) by mouth at bedtime. What changed: how much to take   lamoTRIgine 100 MG tablet Commonly known as: LAMICTAL Take 100 mg by mouth at bedtime.   lansoprazole 30 MG capsule Commonly known as: PREVACID Take 30 mg by mouth daily.   LORazepam 0.5 MG tablet Commonly known as: ATIVAN Take 0.5 mg by mouth 2 (two) times daily.   metoprolol succinate 50 MG 24 hr tablet Commonly known as: TOPROL-XL Take 50 mg by mouth 2 (two) times daily.   montelukast 10 MG tablet Commonly known as: SINGULAIR Take 10 mg by mouth at bedtime.   Namenda XR 28 MG Cp24 24 hr capsule Generic drug: memantine Take 28 mg by mouth daily.   nitroGLYCERIN 0.4 MG/SPRAY spray Commonly known as: NITROLINGUAL Place 2 sprays under the tongue every 5 (five) minutes x 3 doses as needed for chest pain.   polyethylene glycol 17 g packet Commonly known as: MIRALAX / GLYCOLAX Take 17 g by mouth daily as needed for mild constipation.   tamsulosin 0.4 MG Caps capsule Commonly known as: FLOMAX Take 1 capsule (0.4 mg total) by mouth daily after supper.   traZODone 100 MG tablet Commonly known as: DESYREL Take 100 mg by mouth at bedtime.   valACYclovir 1000 MG tablet Commonly known as: VALTREX Take 1,000 mg by mouth as needed (Fever  blisters).        DISCHARGE INSTRUCTIONS:   Follow-up PMD 5 days Follow-up urology in a few weeks  If you experience worsening of your admission symptoms, develop shortness of breath, life threatening emergency, suicidal or homicidal thoughts you must seek medical attention immediately by calling 911 or calling your MD immediately  if symptoms less severe.  You Must read complete instructions/literature along with all the possible adverse reactions/side effects for all the Medicines you take and that have been prescribed to you. Take any new Medicines after you have completely understood and accept all the possible adverse reactions/side effects.   Please note  You were cared  for by a hospitalist during your hospital stay. If you have any questions about your discharge medications or the care you received while you were in the hospital after you are discharged, you can call the unit and asked to speak with the hospitalist on call if the hospitalist that took care of you is not available. Once you are discharged, your primary care physician will handle any further medical issues. Please note that NO REFILLS for any discharge medications will be authorized once you are discharged, as it is imperative that you return to your primary care physician (or establish a relationship with a primary care physician if you do not have one) for your aftercare needs so that they can reassess your need for medications and monitor your lab values.    Today   CHIEF COMPLAINT:   Chief Complaint  Patient presents with  . Leg Swelling  . Kidney problem    HISTORY OF PRESENT ILLNESS:  Johnny Mccoy  is a 75 y.o. male came in with acute kidney injury and leg swelling   Mccoy SIGNS:  Blood pressure 139/68, pulse 66, temperature 98.5 F (36.9 C), resp. rate 16, height 5\' 7"  (1.702 m), weight 106.1 kg, SpO2 94 %.  I/O:    Intake/Output Summary (Last 24 hours) at 06/12/2020 1546 Last data filed at  06/12/2020 0524 Gross per 24 hour  Intake --  Output 1550 ml  Net -1550 ml    PHYSICAL EXAMINATION:  GENERAL:  75 y.o.-year-old patient lying in the bed with no acute distress.  EYES: Pupils equal, round, reactive to light and accommodation. No scleral icterus.  HEENT: Head atraumatic, normocephalic. Oropharynx and nasopharynx clear.  LUNGS: Normal breath sounds bilaterally, no wheezing, rales,rhonchi or crepitation. No use of accessory muscles of respiration.  CARDIOVASCULAR: S1, S2 normal. No murmurs, rubs, or gallops.  ABDOMEN: Soft, non-tender, non-distended.  EXTREMITIES: 2+ pedal edema, no cyanosis, or clubbing.  NEUROLOGIC: Cranial nerves II through XII are intact. PSYCHIATRIC: The patient is alert and oriented x 3.  SKIN: No obvious rash, lesion, or ulcer.   DATA REVIEW:   CBC Recent Labs  Lab 06/12/20 0459  WBC 4.7  HGB 13.7  HCT 38.4*  PLT 144*    Chemistries  Recent Labs  Lab 06/10/20 1653 06/10/20 2149 06/12/20 0459  NA 126*   < > 130*  K 4.4   < > 4.8  CL 86*   < > 94*  CO2 28   < > 26  GLUCOSE 176*   < > 140*  BUN 51*   < > 27*  CREATININE 2.36*   < > 1.09  CALCIUM 8.8*   < > 9.8  MG 2.0  --   --   AST 26  --   --   ALT 23  --   --   ALKPHOS 46  --   --   BILITOT 0.8  --   --    < > = values in this interval not displayed.     Microbiology Results  Results for orders placed or performed during the hospital encounter of 06/10/20  SARS CORONAVIRUS 2 (TAT 6-24 HRS) Nasopharyngeal Nasopharyngeal Swab     Status: None   Collection Time: 06/10/20  5:45 PM   Specimen: Nasopharyngeal Swab  Result Value Ref Range Status   SARS Coronavirus 2 NEGATIVE NEGATIVE Final    Comment: (NOTE) SARS-CoV-2 target nucleic acids are NOT DETECTED.  The SARS-CoV-2 RNA is generally detectable in upper and lower respiratory  specimens during the acute phase of infection. Negative results do not preclude SARS-CoV-2 infection, do not rule out co-infections with other  pathogens, and should not be used as the sole basis for treatment or other patient management decisions. Negative results must be combined with clinical observations, patient history, and epidemiological information. The expected result is Negative.  Fact Sheet for Patients: SugarRoll.be  Fact Sheet for Healthcare Providers: https://www.woods-mathews.com/  This test is not yet approved or cleared by the [place] FDA and  has been authorized for detection and/or diagnosis of SARS-CoV-2 by FDA under an Emergency Use Authorization (EUA). This EUA will remain  in effect (meaning this test can be used) for the duration of the COVID-19 declaration under Se ction 564(b)(1) of the Act, 21 U.S.C. section 360bbb-3(b)(1), unless the authorization is terminated or revoked sooner.  Performed at [facility], [place] [address] [place], [place] [address]     RADIOLOGY:  DG Chest 2 View  Result Date: 06/10/2020 CLINICAL DATA:  Shortness of breath EXAM: CHEST - 2 VIEW COMPARISON:  CT 10/10/2018, radiograph 02/13/2016 FINDINGS: No focal airspace disease or effusion. Upper sternotomy changes. Bronchitic changes. Stable cardiomediastinal silhouette. No pneumothorax. IMPRESSION: No active cardiopulmonary disease.  Bronchitic changes Electronically Signed   By: Donavan Foil M.D.   On: 06/10/2020 18:50     Management plans discussed with the patient, and he is agreement.  He deferred me calling family at this time.  CODE STATUS:  Code Status History    Date Active Date Inactive Code Status Order ID Comments User Context   06/10/2020 1849 06/12/2020 1518 Full Code [id number]  Marcelyn Bruins, MD ED   04/12/2019 1422 04/13/2019 1158 Full Code [id number]  Corey Skains, MD Inpatient   10/28/2015 1006 10/31/2015 1453 Full Code [id number]  Kem Giovanni, PA-C Inpatient   Advance Care Planning Activity    Questions for Most Recent Historical Code Status  (Order [id number])       TOTAL TIME TAKING CARE OF THIS PATIENT: 32 minutes.    Loletha Grayer M.D on 06/12/2020 at 3:46 PM  Between 7am to 6pm - Pager - [phone]  After 6pm go to [email or web address] - password [email or web address]  [facility]  CC: Primary care physician; Idelle Crouch, MD

## 2020-06-12 NOTE — Discharge Instructions (Signed)
Acute Kidney Injury, Adult  Acute kidney injury is a sudden worsening of kidney function. The kidneys are organs that have several jobs. They filter the blood to remove waste products and extra fluid. They also maintain a healthy balance of minerals and hormones in the body, which helps control blood pressure and keep bones strong. With this condition, your kidneys do not do their jobs as well as they should. This condition ranges from mild to severe. Over time, it may develop into long-lasting (chronic) kidney disease. Early detection and treatment may prevent acute kidney injury from developing into a chronic condition. What are the causes? Common causes of this condition include:  A problem with blood flow to the kidneys. This may be caused by: ? Low blood pressure (hypotension) or shock. ? Blood loss. ? Heart and blood vessel (cardiovascular) disease. ? Severe burns. ? Liver disease.  Direct damage to the kidneys. This may be caused by: ? Certain medicines. ? A kidney infection. ? Poisoning. ? Being around or in contact with toxic substances. ? A surgical wound. ? A hard, direct hit to the kidney area.  A sudden blockage of urine flow. This may be caused by: ? Cancer. ? Kidney stones. ? An enlarged prostate in males. What increases the risk? You are more likely to develop this condition if you:  Are older than age 65.  Are male.  Are hospitalized, especially if you are in critical condition.  Have certain conditions, such as: ? Chronic kidney disease. ? Diabetes. ? Coronary artery disease and heart failure. ? Pulmonary disease. ? Chronic liver disease. What are the signs or symptoms? Symptoms of this condition may not be obvious until the condition becomes severe. Symptoms of this condition can include:  Tiredness (lethargy) or difficulty staying awake.  Nausea or vomiting.  Swelling (edema) of the face, legs, ankles, or feet.  Problems with urination, such  as: ? Pain in the abdomen, or pain along the side of your stomach (flank). ? Producing little or no urine. ? Passing urine with a weak flow.  Muscle twitches and cramps, especially in the legs.  Confusion or trouble concentrating.  Loss of appetite.  Fever. How is this diagnosed? Your health care provider can diagnose this condition based on your symptoms, medical history, and a physical exam.  You may also have other tests, such as:  Blood tests.  Urine tests.  Imaging tests.  A test in which a sample of tissue is removed from the kidneys to be examined under a microscope (kidney biopsy). How is this treated? Treatment for this condition depends on the cause and how severe the condition is. In mild cases, treatment may not be needed. The kidneys may heal on their own. In more severe cases, treatment will involve:  Treating the cause of the kidney injury. This may involve changing any medicines you are taking or adjusting your dosage.  Fluids. You may need specialized IV fluids to balance your body's needs.  Having a catheter placed to drain urine and prevent blockages.  Preventing problems from occurring. This may mean avoiding certain medicines or procedures that can cause further injury to the kidneys. In some cases, treatment may also require:  A procedure to remove toxic wastes from the body (dialysis or continuous renal replacement therapy, CRRT).  Surgery. This may be done to repair a torn kidney or to remove the blockage from the urinary system. Follow these instructions at home: Medicines  Take over-the-counter and prescription medicines only as   told by your health care provider.  Do not take any new medicines without your health care provider's approval. Many medicines can worsen your kidney damage.  Do not take any vitamin and mineral supplements without your health care provider's approval. Many nutritional supplements can worsen your kidney  damage. Lifestyle  If your health care provider prescribed changes to your diet, follow them. You may need to decrease the amount of protein you eat.  Achieve and maintain a healthy weight. If you need help with this, ask your health care provider.  Start or continue an exercise plan. Try to exercise at least 30 minutes a day, 5 days a week.  Do not use any products that contain nicotine or tobacco, such as cigarettes, e-cigarettes, and chewing tobacco. If you need help quitting, ask your health care provider.   General instructions  Keep track of your blood pressure. Report changes in your blood pressure as told by your health care provider.  Stay up to date with your vaccines. Ask your health care provider which vaccines you need.  Keep all follow-up visits as told by your health care provider. This is important.   Where to find more information  American Association of Kidney Patients: www.aakp.org  National Kidney Foundation: www.kidney.org  American Kidney Fund: www.akfinc.org  Life Options Rehabilitation Program: ? www.lifeoptions.org ? www.kidneyschool.org Contact a health care provider if:  Your symptoms get worse.  You develop new symptoms. Get help right away if:  You develop symptoms of worsening kidney disease, which include: ? Headaches. ? Abnormally dark or light skin. ? Easy bruising. ? Frequent hiccups. ? Chest pain. ? Shortness of breath. ? End of menstruation in women. ? Seizures. ? Confusion or altered mental status. ? Abdominal or back pain. ? Itchiness.  You have a fever.  Your body is producing less urine.  You have pain or bleeding when you urinate. Summary  Acute kidney injury is a sudden worsening of kidney function.  Acute kidney injury can be caused by problems with blood flow to the kidneys, direct damage to the kidneys, and sudden blockage of urine flow.  Symptoms of this condition may not be obvious until it becomes severe.  Symptoms may include edema, lethargy, confusion, nausea or vomiting, and problems passing urine.  This condition can be diagnosed with blood tests, urine tests, and imaging tests. Sometimes a kidney biopsy is done to diagnose this condition.  Treatment for this condition often involves treating the underlying cause. It is treated with fluids, medicines, diet changes, dialysis, or surgery. This information is not intended to replace advice given to you by your health care provider. Make sure you discuss any questions you have with your health care provider. Document Revised: 12/27/2018 Document Reviewed: 12/27/2018 Elsevier Patient Education  2021 [employer].  

## 2020-06-22 NOTE — Progress Notes (Signed)
06/26/2020  10:08 AM   Johnny Mccoy Sep 20, 1945 [id number]  Referring provider: Idelle Crouch, MD [facility] [place],  [place] [address] Chief Complaint  Patient presents with  . Hematuria    HPI: Johnny Mccoy is a 75 y.o. male with a personal history of acute kidney injury, nocturia, who presents today for a hospital follow up.   On 06/10/2020 Johnny Mccoy visited the ER for worsening lower extremity edema and weakness.  Prior to this, he been working with cardiology on diuresis due to lower extremity edema.  Likely as a result of diuretics, his creatinine bumped to 2.36 and ultimately ended up being admitted for IV fluids.  During the admission, he started to have difficulty emptying his bladder and decreased urine output per his report.  Started empirically on Flomax and advised to follow-up.  Had a CT abdomen and pelvis without contrast scan for abdominal distention which was unremarkable.   He also notes that he saw a drop of blood in his underwear near the level of his penis and felt that it was likely from his bladder/urine.  This only occurred on 1 occasion.  He never saw blood mixed in with his urine.  UA w microscopic from 4/26 was negative. Creatinine has normalized as of yesterday to 1.0.  UA from ED admission also negative.  PSA was 0.27 in 03/2019  He expressed that he feels like his testicles are larger and that his penis seems to be retracting.  Never smoker.  He denies a personal history of urinary retention or renal failure prior to this.  Today, his urinary symptoms are well controlled and back to baseline.      IPSS    Row Name 06/26/20 0900         International Prostate Symptom Score   How often have you had the sensation of not emptying your bladder? Not at All     How often have you had to urinate less than every two hours? Less than half the time     How often have you found you stopped and started again several  times when you urinated? Less than half the time     How often have you found it difficult to postpone urination? Less than half the time     How often have you had a weak urinary stream? Less than half the time     How often have you had to strain to start urination? Not at All     How many times did you typically get up at night to urinate? 1 Time     Total IPSS Score 9           Quality of Life due to urinary symptoms   If you were to spend the rest of your life with your urinary condition just the way it is now how would you feel about that? Mostly Satisfied            Score:  1-7 Mild 8-19 Moderate 20-35 Severe   PMH: Past Medical History:  Diagnosis Date  . Anxiety    takes Lorazepam daily  . Arthritis   . Asthma    Albuterol as needed.Breo daily as well as Flovent  . Blood clot in vein    after r knee replacement.  filter placed and removed.  Marland Kitchen CAD (coronary artery disease)   . Cataract   . Cataracts, bilateral    immature  . Chronic  back pain    compression  . Complication of anesthesia    seizures after partial knee in 2014.Surgery was in am and an still in post op until 9 that night. Was sent to [facility] for 5 days d/t uncontrollable shaking,incoherant (02/26/20 pt states was tremors from fentanyl)  . COVID-19 05/2018  . Depressed    takes Pristiq daily  . Essential tremor   . GERD (gastroesophageal reflux disease)    takes Dexilant daily as well as Zantac  . 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 History of bronchitis    Jan-Mar 2017  . History of hiatal hernia   . Hyperlipidemia    takes Atorvastatin daily  . Hypertension    takes HCTZ,Amlodipine,and Metoprolol daily  . Insomnia    takes Trazodone nightly   . Joint pain   . Muscle spasm    in neck.Takes Flexeril as needed  . Numbness    right knee,right hand,and toes on left  . Seizures (HCC)    [place]. takes Lamotrigine daily. Last seizure 6+ yrs ago  . Sleep apnea    CPAP    Surgical  History: Past Surgical History:  Procedure Laterality Date  . ANKLE ARTHROSCOPY Left 03/07/2020   Procedure: ANKLE ARTHROSCOPY;  Surgeon: Caroline More, DPM;  Location: [facility];  Service: Podiatry;  Laterality: Left;  . BACK SURGERY     x3  . CARDIAC CATHETERIZATION     2015  . CARPAL TUNNEL RELEASE    . COLONOSCOPY    . CORONARY ANGIOPLASTY WITH STENT PLACEMENT     x 3  . dental implants    . ESOPHAGOGASTRODUODENOSCOPY    . EYE SURGERY Bilateral   . EYE SURGERY     both eyes in 60's d/t muscle causing to squint  . HERNIA REPAIR     umbilical  . JOINT REPLACEMENT Right    knee  . KNEE ARTHROSCOPY    . LEFT HEART CATH AND CORONARY ANGIOGRAPHY Left 04/12/2019   Procedure: LEFT HEART CATH AND CORONARY ANGIOGRAPHY;  Surgeon: Corey Skains, MD;  Location: [facility];  Service: Cardiovascular;  Laterality: Left;  Marland Kitchen MEDIAL PARTIAL KNEE REPLACEMENT Right   . MEDIASTERNOTOMY N/A 10/28/2015   Procedure: PARTIAL STERNOTOMY;  Surgeon: [name], MD;  Location: [facility];  Service: Thoracic;  Laterality: N/A;  . numerous hand surgery    . ORIF ANKLE FRACTURE Left 03/07/2020   Procedure: OPEN REDUCTION INTERNAL FIXATION (ORIF) ANKLE FRACTURE;  Surgeon: Caroline More, DPM;  Location: [facility];  Service: Podiatry;  Laterality: Left;  . 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   Procedure: RESECTION OF anterier MEDIASTINAL TUMOR;  Surgeon: [name], MD;  Location: [facility];  Service: Thoracic;  Laterality: N/A;  . right hand fusion    . 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 TENDON REPAIR Left 03/07/2020   Procedure: FLEXOR TENDON REPAIR;  Surgeon: Caroline More, DPM;  Location: [facility];  Service: Podiatry;  Laterality: Left;  sleep apnea  . TONSILLECTOMY      Home Medications:  Allergies as of 06/26/2020      Reactions   Fentanyl Other (See Comments)   Possible involuntary tremors   Methocarbamol Other (See Comments)    confusion      Medication List       Accurate as of June 26, 2020 10:08 AM. If you have any questions, ask your nurse  or doctor.        STOP taking these medications   traZODone 100 MG tablet Commonly known as: DESYREL Stopped by: Hollice Espy, MD     TAKE these medications   albuterol 108 (90 Base) MCG/ACT inhaler Commonly known as: VENTOLIN HFA Inhale 2 puffs into the lungs every 6 (six) hours as needed for wheezing or shortness of breath.   aspirin EC 81 MG tablet Take 81 mg by mouth daily. Swallow whole.   atorvastatin 80 MG tablet Commonly known as: LIPITOR Take 1 tablet by mouth daily.   Breo Ellipta 100-25 MCG/INH Aepb Generic drug: fluticasone furoate-vilanterol Inhale 1 puff into the lungs daily.   budesonide 0.25 MG/2ML nebulizer solution Commonly known as: PULMICORT Take 0.25 mg by nebulization daily as needed (shortness of breath).   desvenlafaxine 100 MG 24 hr tablet Commonly known as: PRISTIQ Take 100 mg by mouth daily.   gabapentin 400 MG capsule Commonly known as: NEURONTIN Take 1 capsule (400 mg total) by mouth at bedtime.   gemfibrozil 600 MG tablet Commonly known as: LOPID Take 600 mg by mouth 2 (two) times daily.   lamoTRIgine 100 MG tablet Commonly known as: LAMICTAL Take 100 mg by mouth at bedtime.   lansoprazole 30 MG capsule Commonly known as: PREVACID Take 30 mg by mouth daily.   LORazepam 0.5 MG tablet Commonly known as: ATIVAN Take 0.5 mg by mouth 2 (two) times daily.   metoprolol succinate 50 MG 24 hr tablet Commonly known as: TOPROL-XL Take 50 mg by mouth 2 (two) times daily.   montelukast 10 MG tablet Commonly known as: SINGULAIR Take 10 mg by mouth at bedtime.   Namenda XR 28 MG Cp24 24 hr capsule Generic drug: memantine Take 28 mg by mouth daily.   nitroGLYCERIN 0.4 MG/SPRAY spray Commonly known as: NITROLINGUAL Place 2 sprays under the tongue every 5 (five) minutes x 3 doses as needed for chest pain.    polyethylene glycol 17 g packet Commonly known as: MIRALAX / GLYCOLAX Take 17 g by mouth daily as needed for mild constipation.   potassium chloride 8 MEQ tablet Commonly known as: KLOR-CON Take by mouth.   tamsulosin 0.4 MG Caps capsule Commonly known as: FLOMAX Take 1 capsule (0.4 mg total) by mouth daily after supper.   torsemide 20 MG tablet Commonly known as: DEMADEX Take 20 mg by mouth daily.   valACYclovir 1000 MG tablet Commonly known as: VALTREX Take 1,000 mg by mouth as needed (Fever blisters).       Allergies: Allergies  Allergen Reactions  . Fentanyl Other (See Comments)    Possible involuntary tremors  . Methocarbamol Other (See Comments)    confusion    Family History: Family History  Problem Relation Age of Onset  . Cancer Father 41       Bone   . COPD Father   . Heart disease Mother   . Hyperlipidemia Mother   . Stroke Mother   . Heart disease Maternal Grandfather   . Hyperlipidemia Maternal Grandfather     Social History:   reports that he has never smoked. He has never used smokeless tobacco. He reports current alcohol use of about 14.0 standard drinks of alcohol per week. He reports that he does not use drugs.  ROS: Pertinent ROS in HPI.  Physical Exam: BP (!) 170/83   Pulse 69   Ht 5\' 7"  (1.702 m)   Wt 247 lb (112 kg)   BMI 38.69 kg/m   Constitutional:  Alert and oriented, No acute distress. HEENT: [place] AT, moist mucus membranes.  Trachea midline, no masses. Cardiovascular: No clubbing, cyanosis, or edema. Respiratory: Normal respiratory effort, no increased work of breathing. GU: Bilateral descended testicles with mild bilateral hydroceles (testicles still easily palpable) with buried phallus. Rectal: Prostate is mildly enlarged 40 grams, no nodules, non-tender, with normal sphincter tone. Skin: No rashes, bruises or suspicious lesions. Neurologic: Grossly intact, no focal deficits, moving all 4 extremities. Psychiatric: Normal  mood and affect.  Laboratory Data:  Lab Results  Component Value Date   CREATININE 1.09 06/12/2020   I have reviewed the labs.  Urinalysis  Negative  I have reviewed the labs.   Pertinent Imaging:  Results for orders placed or performed in visit on 06/26/20  Bladder Scan (Post Void Residual) in office  Result Value Ref Range   Scan Result 0      CLINICAL DATA:  Abdominal swelling.  EXAM: CT ABDOMEN AND PELVIS WITHOUT CONTRAST  TECHNIQUE: Multidetector CT imaging of the abdomen and pelvis was performed following the standard protocol without IV contrast.  COMPARISON:  February 25, 2018.  FINDINGS: Lower chest: No acute abnormality.  Hepatobiliary: No focal liver abnormality is seen. No gallstones, gallbladder wall thickening, or biliary dilatation.  Pancreas: Unremarkable. No pancreatic ductal dilatation or surrounding inflammatory changes.  Spleen: Normal in size without focal abnormality.  Adrenals/Urinary Tract: Adrenal glands are unremarkable. Kidneys are normal, without renal calculi, focal lesion, or hydronephrosis. Bladder is unremarkable.  Stomach/Bowel: Stomach is within normal limits. Appendix appears normal. No evidence of bowel wall thickening, distention, or inflammatory changes.  Vascular/Lymphatic: Aortic atherosclerosis. No enlarged abdominal or pelvic lymph nodes.  Reproductive: Prostate is unremarkable.  Other: Moderate size fat containing right inguinal hernia is noted. No ascites is noted.  Musculoskeletal: Postsurgical and degenerative changes are noted in the lower lumbar spine. No acute abnormality is noted.  IMPRESSION: No acute abnormality seen in the abdomen or pelvis.  Moderate size fat containing right inguinal hernia is noted.  Aortic Atherosclerosis (ICD10-I70.0).   Electronically Signed   By: Marijo Conception M.D.   On: 06/10/2020 14:32  I have personally reviewed the images and agree with  radiologist interpretation.    Assessment & Plan:     1. Gross hematuria Based on the nature of the blood, is unclear whether or not this is related to his GU tract although most likely based on the location  Petra Kuba is suggestive of lower tract and or prostatic bleeding  Multiple urinalysis negative for any evidence of microscopic blood  In light of most recent noncontrast CT scan which is unremarkable, will hold off on further upper tract imaging at this time  We did discuss the option of cystoscopy, including risk and benefits.  He would like to proceed with his to rule out any underlying pathology.  All questions answered.  2. 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 Aquired buried penis Discussed the pathophysiology of this.  May improve with weight loss.   Follow Up:  Schedule cystoscopy  I, Ardyth Gal, am acting as a scribe for Dr. Hollice Espy.   I have reviewed the above documentation for accuracy and completeness, and I agree with the above.   Hollice Espy, MD   [facility] [address], [place], [place] [address] [phone]

## 2020-06-26 ENCOUNTER — Other Ambulatory Visit: Payer: Self-pay

## 2020-06-26 ENCOUNTER — Ambulatory Visit (INDEPENDENT_AMBULATORY_CARE_PROVIDER_SITE_OTHER): Payer: Medicare Other | Admitting: Urology

## 2020-06-26 ENCOUNTER — Encounter: Payer: Self-pay | Admitting: Urology

## 2020-06-26 VITALS — BP 170/83 | HR 69 | Ht 67.0 in | Wt 247.0 lb

## 2020-06-26 DIAGNOSIS — R319 Hematuria, unspecified: Secondary | ICD-10-CM | POA: Diagnosis not present

## 2020-06-26 LAB — BLADDER SCAN AMB NON-IMAGING: Scan Result: 0

## 2020-06-26 NOTE — Patient Instructions (Signed)
Cystoscopy Cystoscopy is a procedure that is used to help diagnose and sometimes treat conditions that affect the lower urinary tract. The lower urinary tract includes the bladder and the urethra. The urethra is the tube that drains urine from the bladder. Cystoscopy is done using a thin, tube-shaped instrument with a light and camera at the end (cystoscope). The cystoscope may be hard or flexible, depending on the goal of the procedure. The cystoscope is inserted through the urethra, into the bladder. Cystoscopy may be recommended if you have:  Urinary tract infections that keep coming back.  Blood in the urine (hematuria).  An inability to control when you urinate (urinary incontinence) or an overactive bladder.  Unusual cells found in a urine sample.  A blockage in the urethra, such as a urinary stone.  Painful urination.  An abnormality in the bladder found during an intravenous pyelogram (IVP) or CT scan. Cystoscopy may also be done to remove a sample of tissue to be examined under a microscope (biopsy). What are the risks? Generally, this is a safe procedure. However, problems may occur, including:  Infection.  Bleeding.  What happens during the procedure?  1. You will be given one or more of the following: ? A medicine to numb the area (local anesthetic). 2. The area around the opening of your urethra will be cleaned. 3. The cystoscope will be passed through your urethra into your bladder. 4. Germ-free (sterile) fluid will flow through the cystoscope to fill your bladder. The fluid will stretch your bladder so that your health care provider can clearly examine your bladder walls. 5. Your doctor will look at the urethra and bladder. 6. The cystoscope will be removed The procedure may vary among health care providers  What can I expect after the procedure? After the procedure, it is common to have: 1. Some soreness or pain in your abdomen and urethra. 2. Urinary symptoms.  These include: ? Mild pain or burning when you urinate. Pain should stop within a few minutes after you urinate. This may last for up to 1 week. ? A small amount of blood in your urine for several days. ? Feeling like you need to urinate but producing only a small amount of urine. Follow these instructions at home: General instructions  Return to your normal activities as told by your health care provider.   Do not drive for 24 hours if you were given a sedative during your procedure.  Watch for any blood in your urine. If the amount of blood in your urine increases, call your health care provider.  If a tissue sample was removed for testing (biopsy) during your procedure, it is up to you to get your test results. Ask your health care provider, or the department that is doing the test, when your results will be ready.  Drink enough fluid to keep your urine pale yellow.  Keep all follow-up visits as told by your health care provider. This is important. Contact a health care provider if you:  Have pain that gets worse or does not get better with medicine, especially pain when you urinate.  Have trouble urinating.  Have more blood in your urine. Get help right away if you:  Have blood clots in your urine.  Have abdominal pain.  Have a fever or chills.  Are unable to urinate. Summary  Cystoscopy is a procedure that is used to help diagnose and sometimes treat conditions that affect the lower urinary tract.  Cystoscopy is done using   a thin, tube-shaped instrument with a light and camera at the end.  After the procedure, it is common to have some soreness or pain in your abdomen and urethra.  Watch for any blood in your urine. If the amount of blood in your urine increases, call your health care provider.  If you were prescribed an antibiotic medicine, take it as told by your health care provider. Do not stop taking the antibiotic even if you start to feel better. This  information is not intended to replace advice given to you by your health care provider. Make sure you discuss any questions you have with your health care provider. Document Revised: 02/08/2018 Document Reviewed: 02/08/2018 Elsevier Patient Education  2020 [employer]   

## 2020-06-28 LAB — URINALYSIS, COMPLETE
Bilirubin, UA: NEGATIVE
Glucose, UA: NEGATIVE
Leukocytes,UA: NEGATIVE
Nitrite, UA: NEGATIVE
Protein,UA: NEGATIVE
RBC, UA: NEGATIVE
Specific Gravity, UA: 1.015 (ref 1.005–1.030)
Urobilinogen, Ur: 0.2 mg/dL (ref 0.2–1.0)
pH, UA: 6.5 (ref 5.0–7.5)

## 2020-06-28 LAB — MICROSCOPIC EXAMINATION
Bacteria, UA: NONE SEEN
Epithelial Cells (non renal): NONE SEEN /hpf (ref 0–10)
WBC, UA: NONE SEEN /hpf (ref 0–5)

## 2020-07-01 ENCOUNTER — Other Ambulatory Visit: Payer: Self-pay

## 2020-07-01 ENCOUNTER — Ambulatory Visit (INDEPENDENT_AMBULATORY_CARE_PROVIDER_SITE_OTHER): Payer: Medicare Other | Admitting: Vascular Surgery

## 2020-07-01 VITALS — BP 154/77 | HR 70 | Ht 67.0 in | Wt 243.0 lb

## 2020-07-01 DIAGNOSIS — B029 Zoster without complications: Secondary | ICD-10-CM | POA: Insufficient documentation

## 2020-07-01 DIAGNOSIS — I890 Lymphedema, not elsewhere classified: Secondary | ICD-10-CM | POA: Diagnosis not present

## 2020-07-01 DIAGNOSIS — E782 Mixed hyperlipidemia: Secondary | ICD-10-CM

## 2020-07-01 DIAGNOSIS — I10 Essential (primary) hypertension: Secondary | ICD-10-CM

## 2020-07-01 DIAGNOSIS — H269 Unspecified cataract: Secondary | ICD-10-CM | POA: Insufficient documentation

## 2020-07-01 DIAGNOSIS — J690 Pneumonitis due to inhalation of food and vomit: Secondary | ICD-10-CM | POA: Insufficient documentation

## 2020-07-01 DIAGNOSIS — I872 Venous insufficiency (chronic) (peripheral): Secondary | ICD-10-CM

## 2020-07-01 DIAGNOSIS — I2510 Atherosclerotic heart disease of native coronary artery without angina pectoris: Secondary | ICD-10-CM

## 2020-07-01 DIAGNOSIS — M1990 Unspecified osteoarthritis, unspecified site: Secondary | ICD-10-CM | POA: Insufficient documentation

## 2020-07-01 NOTE — Progress Notes (Signed)
MRN : [id number]  Johnny Mccoy is a 75 y.o. (Feb 12, 1946) male who presents with chief complaint of  Chief Complaint  Patient presents with  . New Patient (Initial Visit)    BIL leg edema   .  History of Present Illness:   Patient is seen for evaluation of leg pain and leg swelling. The patient first noticed the swelling remotely. The swelling is associated with pain and discoloration. The pain and swelling worsens with prolonged dependency and improves with elevation. The pain is unrelated to activity.  The patient notes that in the morning the legs are significantly improved but they steadily worsened throughout the course of the day. The patient also notes a steady worsening of the discoloration in the ankle and shin area.   The patient denies claudication symptoms.  The patient denies symptoms consistent with rest pain.  The patient denies and extensive history of DJD and LS spine disease.  The patient has no had any past angiography, interventions or vascular surgery.  Elevation makes the leg symptoms better, dependency makes them much worse. There is no history of ulcerations. The patient denies any recent changes in medications.  The patient has been wearing graduated compression socks for several years.  In the past, he seemed to have much better control but over the recent few months his edema seems to have worsened substantially in spite of his compression  The patient denies a history of DVT or PE. There is no prior history of phlebitis. There is no history of primary lymphedema.  No history of malignancies. No history of trauma or groin or pelvic surgery. There is no history of radiation treatment to the groin or pelvis  The patient denies amaurosis fugax or recent TIA symptoms. There are no recent neurological changes noted. The patient denies recent episodes of angina or shortness of breath  Current Meds  Medication Sig  . albuterol (VENTOLIN HFA) 108 (90 Base)  MCG/ACT inhaler Inhale 2 puffs into the lungs every 6 (six) hours as needed for wheezing or shortness of breath.  Marland Kitchen aspirin EC 81 MG tablet Take 81 mg by mouth daily. Swallow whole.  Marland Kitchen BREO ELLIPTA 100-25 MCG/INH AEPB Inhale 1 puff into the lungs daily.   . budesonide (PULMICORT) 0.25 MG/2ML nebulizer solution Take 0.25 mg by nebulization daily as needed (shortness of breath).  Marland Kitchen desvenlafaxine (PRISTIQ) 100 MG 24 hr tablet Take 100 mg by mouth daily.  Marland Kitchen gabapentin (NEURONTIN) 400 MG capsule Take 1 capsule (400 mg total) by mouth at bedtime.  . lamoTRIgine (LAMICTAL) 100 MG tablet Take 100 mg by mouth at bedtime.  . lansoprazole (PREVACID) 30 MG capsule Take 30 mg by mouth daily.  Marland Kitchen LORazepam (ATIVAN) 0.5 MG tablet Take 0.5 mg by mouth 2 (two) times daily.   . metoprolol succinate (TOPROL-XL) 50 MG 24 hr tablet Take 50 mg by mouth 2 (two) times daily.   . montelukast (SINGULAIR) 10 MG tablet Take 10 mg by mouth at bedtime.  Marland Kitchen NAMENDA XR 28 MG CP24 24 hr capsule Take 28 mg by mouth daily.   . nitroGLYCERIN (NITROLINGUAL) 0.4 MG/SPRAY spray Place 2 sprays under the tongue every 5 (five) minutes x 3 doses as needed for chest pain.   . polyethylene glycol (MIRALAX / GLYCOLAX) 17 g packet Take 17 g by mouth daily as needed for mild constipation.  . potassium chloride (KLOR-CON) 8 MEQ tablet Take by mouth.  . rosuvastatin (CRESTOR) 10 MG tablet Take 10 mg by  mouth daily.  Marland Kitchen torsemide (DEMADEX) 20 MG tablet Take 20 mg by mouth daily.  . valACYclovir (VALTREX) 1000 MG tablet Take 1,000 mg by mouth as needed (Fever blisters).     Past Medical History:  Diagnosis Date  . Anxiety    takes Lorazepam daily  . Arthritis   . Asthma    Albuterol as needed.Breo daily as well as Flovent  . Blood clot in vein    after r knee replacement.  filter placed and removed.  Marland Kitchen CAD (coronary artery disease)   . Cataract   . Cataracts, bilateral    immature  . Chronic back pain    compression  . Complication of  anesthesia    seizures after partial knee in 2014.Surgery was in am and an still in post op until 9 that night. Was sent to [facility] for 5 days d/t uncontrollable shaking,incoherant (02/26/20 pt states was tremors from fentanyl)  . COVID-19 05/2018  . Depressed    takes Pristiq daily  . Essential tremor   . GERD (gastroesophageal reflux disease)    takes Dexilant daily as well as Zantac  . 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 History of bronchitis    Jan-Mar 2017  . History of hiatal hernia   . Hyperlipidemia    takes Atorvastatin daily  . Hypertension    takes HCTZ,Amlodipine,and Metoprolol daily  . Insomnia    takes Trazodone nightly   . Joint pain   . Muscle spasm    in neck.Takes Flexeril as needed  . Numbness    right knee,right hand,and toes on left  . Seizures (HCC)    [place]. takes Lamotrigine daily. Last seizure 6+ yrs ago  . Sleep apnea    CPAP    Past Surgical History:  Procedure Laterality Date  . ANKLE ARTHROSCOPY Left 03/07/2020   Procedure: ANKLE ARTHROSCOPY;  Surgeon: Caroline More, DPM;  Location: [place];  Service: Podiatry;  Laterality: Left;  . BACK SURGERY     x3  . CARDIAC CATHETERIZATION     2015  . CARPAL TUNNEL RELEASE    . COLONOSCOPY    . CORONARY ANGIOPLASTY WITH STENT PLACEMENT     x 3  . dental implants    . ESOPHAGOGASTRODUODENOSCOPY    . EYE SURGERY Bilateral   . EYE SURGERY     both eyes in 60's d/t muscle causing to squint  . HERNIA REPAIR     umbilical  . JOINT REPLACEMENT Right    knee  . KNEE ARTHROSCOPY    . LEFT HEART CATH AND CORONARY ANGIOGRAPHY Left 04/12/2019   Procedure: LEFT HEART CATH AND CORONARY ANGIOGRAPHY;  Surgeon: Corey Skains, MD;  Location: [place] [facility];  Service: Cardiovascular;  Laterality: Left;  Marland Kitchen MEDIAL PARTIAL KNEE REPLACEMENT Right   . MEDIASTERNOTOMY N/A 10/28/2015   Procedure: PARTIAL STERNOTOMY;  Surgeon: [name], MD;  Location: [place];  Service: Thoracic;   Laterality: N/A;  . numerous hand surgery    . ORIF ANKLE FRACTURE Left 03/07/2020   Procedure: OPEN REDUCTION INTERNAL FIXATION (ORIF) ANKLE FRACTURE;  Surgeon: Caroline More, DPM;  Location: [place];  Service: Podiatry;  Laterality: Left;  . 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   Procedure: RESECTION OF anterier MEDIASTINAL TUMOR;  Surgeon: [name], MD;  Location: [place];  Service: Thoracic;  Laterality: N/A;  . right hand fusion    . 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 TENDON REPAIR Left 03/07/2020   Procedure: FLEXOR TENDON REPAIR;  Surgeon: Caroline More, DPM;  Location: [place];  Service: Podiatry;  Laterality: Left;  sleep apnea  . TONSILLECTOMY      Social History Social History   Tobacco Use  . Smoking status: Never Smoker  . Smokeless tobacco: Never Used  Vaping Use  . Vaping Use: Never used  Substance Use Topics  . Alcohol use: Yes    Alcohol/week: 14.0 standard drinks    Types: 14 Shots of liquor per week    Comment: wine and couple shots of liquor each evening  . Drug use: No    Family History Family History  Problem Relation Age of Onset  . Cancer Father 89       Bone   . COPD Father   . Heart disease Mother   . Hyperlipidemia Mother   . Stroke Mother   . Heart disease Maternal Grandfather   . Hyperlipidemia Maternal Grandfather   No family history of bleeding/clotting disorders, porphyria or autoimmune disease   Allergies  Allergen Reactions  . Fentanyl Other (See Comments)    Possible involuntary tremors  . Methocarbamol Other (See Comments)    confusion   REVIEW OF SYSTEMS (Negative unless checked)  Constitutional: [] Weight loss  [] Fever  [] Chills Cardiac: [] Chest pain   [] Chest pressure   [] Palpitations   [] Shortness of breath when laying flat   [] Shortness of breath with exertion. Vascular:  [] Pain in legs with walking   [x] Pain in legs at rest  [] History of DVT   [] Phlebitis    [x] Swelling in legs   [] Varicose veins   [] Non-healing ulcers Pulmonary:   [] Uses home oxygen   [] Productive cough   [] Hemoptysis   [] Wheeze  [] COPD   [] Asthma Neurologic:  [] Dizziness   [] Seizures   [] History of stroke   [] History of TIA  [] Aphasia   [] Vissual changes   [] Weakness or numbness in arm   [] Weakness or numbness in leg Musculoskeletal:   [] Joint swelling   [x] Joint pain   [] Low back pain Hematologic:  [] Easy bruising  [] Easy bleeding   [] Hypercoagulable state   [] Anemic Gastrointestinal:  [] Diarrhea   [] Vomiting  [] Gastroesophageal reflux/heartburn   [] Difficulty swallowing. Genitourinary:  [] Chronic kidney disease   [] Difficult urination  [] Frequent urination   [] Blood in urine Skin:  [] Rashes   [] Ulcers  Psychological:  [] History of anxiety   []  History of major depression.  Physical Examination  Vitals:   07/01/20 1103  BP: (!) 154/77  Pulse: 70  Weight: 243 lb (110.2 kg)  Height: 5\' 7"  (1.702 m)   Body mass index is 38.06 kg/m. Gen: WD/WN, NAD Head: [place]/AT, No temporalis wasting.  Ear/Nose/Throat: Hearing grossly intact, nares w/o erythema or drainage, poor dentition Eyes: PER, EOMI, sclera nonicteric.  Neck: Supple, no masses.  No bruit or JVD.  Pulmonary:  Good air movement, clear to auscultation bilaterally, no use of accessory muscles.  Cardiac: RRR, normal S1, S2, no Murmurs. Vascular: scattered varicosities present bilaterally.  Moderate venous stasis changes to the legs bilaterally.  4+ soft pitting edema. Vessel Right Left  Radial Palpable Palpable  PT  trace palpable  trace palpable  DP Palpable Palpable  Gastrointestinal: soft, non-distended. No guarding/no peritoneal signs.  Musculoskeletal: M/S 5/5 throughout.  No deformity or atrophy.  Neurologic: CN 2-12 intact. Pain and light touch intact in extremities.  Symmetrical.  Speech is fluent. Motor exam as listed above. Psychiatric: Judgment intact, Mood &  affect appropriate for pt's clinical  situation. Dermatologic: Moderate venous rashes no ulcers noted.  No changes consistent with cellulitis. Lymph : + lichenification or skin changes of chronic lymphedema.  CBC Lab Results  Component Value Date   WBC 4.7 06/12/2020   HGB 13.7 06/12/2020   HCT 38.4 (L) 06/12/2020   MCV 91.2 06/12/2020   PLT 144 (L) 06/12/2020    BMET    Component Value Date/Time   NA 130 (L) 06/12/2020 0459   NA 140 03/02/2014 0420   K 4.8 06/12/2020 0459   K 4.1 03/02/2014 0420   CL 94 (L) 06/12/2020 0459   CL 105 03/02/2014 0420   CO2 26 06/12/2020 0459   CO2 30 03/02/2014 0420   GLUCOSE 140 (H) 06/12/2020 0459   GLUCOSE 105 (H) 03/02/2014 0420   BUN 27 (H) 06/12/2020 0459   BUN 11 03/02/2014 0420   CREATININE 1.09 06/12/2020 0459   CREATININE 0.92 03/02/2014 0420   CALCIUM 9.8 06/12/2020 0459   CALCIUM 7.9 (L) 03/02/2014 0420   GFRNONAA >60 06/12/2020 0459   GFRNONAA >60 03/02/2014 0420   GFRNONAA >60 04/10/2013 1711   GFRAA >60 10/30/2015 1703   GFRAA >60 03/02/2014 0420   GFRAA >60 04/10/2013 1711   Estimated Creatinine Clearance: 70.4 mL/min (by C-G formula based on SCr of 1.09 mg/dL).  COAG Lab Results  Component Value Date   INR 0.99 10/23/2015   INR 0.9 03/22/2013    Radiology CT ABDOMEN PELVIS WO CONTRAST  Result Date: 06/10/2020 CLINICAL DATA:  Abdominal swelling. EXAM: CT ABDOMEN AND PELVIS WITHOUT CONTRAST TECHNIQUE: Multidetector CT imaging of the abdomen and pelvis was performed following the standard protocol without IV contrast. COMPARISON:  February 25, 2018. FINDINGS: Lower chest: No acute abnormality. Hepatobiliary: No focal liver abnormality is seen. No gallstones, gallbladder wall thickening, or biliary dilatation. Pancreas: Unremarkable. No pancreatic ductal dilatation or surrounding inflammatory changes. Spleen: Normal in size without focal abnormality. Adrenals/Urinary Tract: Adrenal glands are unremarkable. Kidneys are normal, without renal calculi, focal  lesion, or hydronephrosis. Bladder is unremarkable. Stomach/Bowel: Stomach is within normal limits. Appendix appears normal. No evidence of bowel wall thickening, distention, or inflammatory changes. Vascular/Lymphatic: Aortic atherosclerosis. No enlarged abdominal or pelvic lymph nodes. Reproductive: Prostate is unremarkable. Other: Moderate size fat containing right inguinal hernia is noted. No ascites is noted. Musculoskeletal: Postsurgical and degenerative changes are noted in the lower lumbar spine. No acute abnormality is noted. IMPRESSION: No acute abnormality seen in the abdomen or pelvis. Moderate size fat containing right inguinal hernia is noted. Aortic Atherosclerosis (ICD10-I70.0). Electronically Signed   By: Marijo Conception M.D.   On: 06/10/2020 14:32   DG Chest 2 View  Result Date: 06/10/2020 CLINICAL DATA:  Shortness of breath EXAM: CHEST - 2 VIEW COMPARISON:  CT 10/10/2018, radiograph 02/13/2016 FINDINGS: No focal airspace disease or effusion. Upper sternotomy changes. Bronchitic changes. Stable cardiomediastinal silhouette. No pneumothorax. IMPRESSION: No active cardiopulmonary disease.  Bronchitic changes Electronically Signed   By: Donavan Foil M.D.   On: 06/10/2020 18:50     Assessment/Plan 1. Lymphedema I have had a long discussion with the patient regarding swelling and why it  causes symptoms.  Patient will begin wearing graduated compression stockings class 1 (20-30 mmHg) on a daily basis a prescription was given. The patient will  beginning wearing the stockings first thing in the morning and removing them in the evening. The patient is instructed specifically not to sleep in the stockings.   In addition, behavioral modification  will be initiated.  This will include frequent elevation, use of over the counter pain medications and exercise such as walking.  I have reviewed systemic causes for chronic edema such as liver, kidney and cardiac etiologies.  The patient denies  problems with these organ systems.    Consideration for a lymph pump will also be made based upon the effectiveness of conservative therapy.  This would help to improve the edema control and prevent sequela such as ulcers and infections   Patient should undergo duplex ultrasound of the venous system to ensure that DVT or reflux is not present.  The patient will follow-up with me after the ultrasound.   - VAS [place] LOWER EXTREMITY VENOUS REFLUX; Future  2. Chronic venous insufficiency I have had a long discussion with the patient regarding swelling and why it  causes symptoms.  Patient will begin wearing graduated compression stockings class 1 (20-30 mmHg) on a daily basis a prescription was given. The patient will  beginning wearing the stockings first thing in the morning and removing them in the evening. The patient is instructed specifically not to sleep in the stockings.   In addition, behavioral modification will be initiated.  This will include frequent elevation, use of over the counter pain medications and exercise such as walking.  I have reviewed systemic causes for chronic edema such as liver, kidney and cardiac etiologies.  The patient denies problems with these organ systems.    Consideration for a lymph pump will also be made based upon the effectiveness of conservative therapy.  This would help to improve the edema control and prevent sequela such as ulcers and infections   Patient should undergo duplex ultrasound of the venous system to ensure that DVT or reflux is not present.  The patient will follow-up with me after the ultrasound.   - VAS [place] LOWER EXTREMITY VENOUS REFLUX; Future  3. Coronary artery disease involving native heart, unspecified vessel or lesion type, unspecified whether angina present Continue cardiac and antihypertensive medications as already ordered and reviewed, no changes at this time.  Continue statin as ordered and reviewed, no changes at this  time  Nitrates PRN for chest pain   4. Essential hypertension Continue antihypertensive medications as already ordered, these medications have been reviewed and there are no changes at this time.   5. Mixed hyperlipidemia Continue statin as ordered and reviewed, no changes at this time     Hortencia Pilar, MD  07/01/2020 11:11 AM

## 2020-07-01 NOTE — Progress Notes (Signed)
   07/03/2020   CC:  Chief Complaint  Patient presents with  . Cysto    HPI: Johnny Mccoy is a 75 y.o. male with a personal history of gross hematuria who presents today for a cystoscopy.    On 06/10/2020 [name] visited the ER for worsening lower extremity edema and weakness.  Prior to this, he been working with cardiology on diuresis due to lower extremity edema.  Likely as a result of diuretics, his creatinine bumped to 2.36 and ultimately ended up being admitted for IV fluids.  During the admission, he started to have difficulty emptying his bladder and decreased urine output per his report.  Started empirically on Flomax and advised to follow-up.  Had a CT abdomen and pelvis without contrast scan for abdominal distention which was unremarkable.   He also notes that he saw a drop of blood in his underwear near the level of his penis and felt that it was likely from his bladder/urine.  This only occurred on 1 occasion.  He never saw blood mixed in with his urine.  UA w microscopic from 4/26 was negative. Creatinine has normalized as of yesterday to 1.0.  UA from ED admission also negative.  UA today also negative  PSA was 0.27 in 03/2019  Never smoker.   Blood pressure (!) 149/68, pulse 76. NED. A&Ox3.   No respiratory distress   Abd soft, NT, ND Normal phallus with bilateral descended testicles  Cystoscopy Procedure Note  Patient identification was confirmed, informed consent was obtained, and patient was prepped using Betadine solution.  Lidocaine jelly was administered per urethral meatus.     Pre-Procedure: - Inspection reveals a normal caliber ureteral meatus.  Procedure: The flexible cystoscope was introduced without difficulty - No urethral strictures/lesions are present. - Normal prostate  - Normal bladder neck  - Bilateral ureteral orifices identified - Bladder mucosa  reveals no ulcers, tumors, or lesions - No bladder stones - No  trabeculation  Retroflexion unremarkable.    Post-Procedure: - Patient tolerated the procedure well  Assessment/ Plan:  1. Gross hematuria No evidence of microscopic hematuria.  Cysto unremarkable.  PCP check annually, if he every has microscopic hematuria he can have a repeat cysto and CT urogram urogram.  Return if develops gross hematuria or urinary issues  Follow up as needed.   Jaclyn Shaggy, am acting as a scribe for Dr. Hollice Espy.    I have reviewed the above documentation for accuracy and completeness, and I agree with the above.   Hollice Espy, MD

## 2020-07-02 ENCOUNTER — Encounter (INDEPENDENT_AMBULATORY_CARE_PROVIDER_SITE_OTHER): Payer: Self-pay | Admitting: Vascular Surgery

## 2020-07-03 ENCOUNTER — Encounter: Payer: Self-pay | Admitting: Urology

## 2020-07-03 ENCOUNTER — Other Ambulatory Visit: Payer: Self-pay

## 2020-07-03 ENCOUNTER — Ambulatory Visit (INDEPENDENT_AMBULATORY_CARE_PROVIDER_SITE_OTHER): Payer: Medicare Other | Admitting: Urology

## 2020-07-03 VITALS — BP 149/68 | HR 76

## 2020-07-03 DIAGNOSIS — R319 Hematuria, unspecified: Secondary | ICD-10-CM | POA: Diagnosis not present

## 2020-07-03 LAB — MICROSCOPIC EXAMINATION
Bacteria, UA: NONE SEEN
Epithelial Cells (non renal): NONE SEEN /hpf (ref 0–10)
RBC, Urine: NONE SEEN /hpf (ref 0–2)

## 2020-07-03 LAB — URINALYSIS, COMPLETE
Bilirubin, UA: NEGATIVE
Glucose, UA: NEGATIVE
Ketones, UA: NEGATIVE
Leukocytes,UA: NEGATIVE
Nitrite, UA: NEGATIVE
Protein,UA: NEGATIVE
RBC, UA: NEGATIVE
Specific Gravity, UA: 1.005 (ref 1.005–1.030)
Urobilinogen, Ur: 0.2 mg/dL (ref 0.2–1.0)
pH, UA: 5 (ref 5.0–7.5)

## 2020-07-30 NOTE — Progress Notes (Signed)
MRN : [id number]  Johnny Mccoy is a 75 y.o. (02-27-1946) male who presents with chief complaint of No chief complaint on file. Marland Kitchen  History of Present Illness:   The patient returns to the office for followup evaluation regarding leg swelling.  The swelling has persisted and the pain associated with swelling continues. There have not been any interval development of a ulcerations or wounds.  Since the previous visit the patient has been wearing graduated compression stockings and has noted little if any improvement in the lymphedema. The patient has been using compression routinely morning until night.  The patient also states elevation during the day and exercise is being done too.   No outpatient medications have been marked as taking for the 08/01/20 encounter (Appointment) with Delana Meyer, Dolores Lory, MD.    Past Medical History:  Diagnosis Date  . Anxiety    takes Lorazepam daily  . Arthritis   . Asthma    Albuterol as needed.Breo daily as well as Flovent  . Blood clot in vein    after r knee replacement.  filter placed and removed.  Marland Kitchen CAD (coronary artery disease)   . Cataract   . Cataracts, bilateral    immature  . Chronic back pain    compression  . Complication of anesthesia    seizures after partial knee in 2014.Surgery was in am and an still in post op until 9 that night. Was sent to [facility] for 5 days d/t uncontrollable shaking,incoherant (02/26/20 pt states was tremors from fentanyl)  . COVID-19 05/2018  . Depressed    takes Pristiq daily  . Essential tremor   . GERD (gastroesophageal reflux disease)    takes Dexilant daily as well as Zantac  . 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 History of bronchitis    Jan-Mar 2017  . History of hiatal hernia   . Hyperlipidemia    takes Atorvastatin daily  . Hypertension    takes HCTZ,Amlodipine,and Metoprolol daily  . Insomnia    takes Trazodone nightly   . Joint pain   . Muscle spasm    in neck.Takes Flexeril as  needed  . Numbness    right knee,right hand,and toes on left  . Seizures (HCC)    [place]. takes Lamotrigine daily. Last seizure 6+ yrs ago  . Sleep apnea    CPAP    Past Surgical History:  Procedure Laterality Date  . ANKLE ARTHROSCOPY Left 03/07/2020   Procedure: ANKLE ARTHROSCOPY;  Surgeon: Caroline More, DPM;  Location: [place];  Service: Podiatry;  Laterality: Left;  . BACK SURGERY     x3  . CARDIAC CATHETERIZATION     2015  . CARPAL TUNNEL RELEASE    . COLONOSCOPY    . CORONARY ANGIOPLASTY WITH STENT PLACEMENT     x 3  . dental implants    . ESOPHAGOGASTRODUODENOSCOPY    . EYE SURGERY Bilateral   . EYE SURGERY     both eyes in 60's d/t muscle causing to squint  . HERNIA REPAIR     umbilical  . JOINT REPLACEMENT Right    knee  . KNEE ARTHROSCOPY    . LEFT HEART CATH AND CORONARY ANGIOGRAPHY Left 04/12/2019   Procedure: LEFT HEART CATH AND CORONARY ANGIOGRAPHY;  Surgeon: Corey Skains, MD;  Location: [facility];  Service: Cardiovascular;  Laterality: Left;  Marland Kitchen MEDIAL PARTIAL KNEE REPLACEMENT Right   . MEDIASTERNOTOMY N/A 10/28/2015  Procedure: PARTIAL STERNOTOMY;  Surgeon: [name], MD;  Location: [place];  Service: Thoracic;  Laterality: N/A;  . numerous hand surgery    . ORIF ANKLE FRACTURE Left 03/07/2020   Procedure: OPEN REDUCTION INTERNAL FIXATION (ORIF) ANKLE FRACTURE;  Surgeon: Caroline More, DPM;  Location: [place];  Service: Podiatry;  Laterality: Left;  . 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   Procedure: RESECTION OF anterier MEDIASTINAL TUMOR;  Surgeon: [name], MD;  Location: [place];  Service: Thoracic;  Laterality: N/A;  . right hand fusion    . 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 TENDON REPAIR Left 03/07/2020   Procedure: FLEXOR TENDON REPAIR;  Surgeon: Caroline More, DPM;  Location: [place];  Service: Podiatry;  Laterality: Left;  sleep apnea  . TONSILLECTOMY       Social History Social History   Tobacco Use  . Smoking status: Never Smoker  . Smokeless tobacco: Never Used  Vaping Use  . Vaping Use: Never used  Substance Use Topics  . Alcohol use: Yes    Alcohol/week: 14.0 standard drinks    Types: 14 Shots of liquor per week    Comment: wine and couple shots of liquor each evening  . Drug use: No    Family History Family History  Problem Relation Age of Onset  . Cancer Father 20       Bone   . COPD Father   . Heart disease Mother   . Hyperlipidemia Mother   . Stroke Mother   . Heart disease Maternal Grandfather   . Hyperlipidemia Maternal Grandfather     Allergies  Allergen Reactions  . Fentanyl Other (See Comments)    Possible involuntary tremors  . Methocarbamol Other (See Comments)    confusion     REVIEW OF SYSTEMS (Negative unless checked)  Constitutional: [] Weight loss  [] Fever  [] Chills Cardiac: [] Chest pain   [] Chest pressure   [] Palpitations   [] Shortness of breath when laying flat   [] Shortness of breath with exertion. Vascular:  [] Pain in legs with walking   [x] Pain in legs at rest  [] History of DVT   [] Phlebitis   [x] Swelling in legs   [] Varicose veins   [] Non-healing ulcers Pulmonary:   [] Uses home oxygen   [] Productive cough   [] Hemoptysis   [] Wheeze  [] COPD   [] Asthma Neurologic:  [] Dizziness   [] Seizures   [] History of stroke   [] History of TIA  [] Aphasia   [] Vissual changes   [] Weakness or numbness in arm   [] Weakness or numbness in leg Musculoskeletal:   [] Joint swelling   [] Joint pain   [] Low back pain Hematologic:  [] Easy bruising  [] Easy bleeding   [] Hypercoagulable state   [] Anemic Gastrointestinal:  [] Diarrhea   [] Vomiting  [x] Gastroesophageal reflux/heartburn   [] Difficulty swallowing. Genitourinary:  [] Chronic kidney disease   [] Difficult urination  [] Frequent urination   [] Blood in urine Skin:  [] Rashes   [] Ulcers  Psychological:  [] History of anxiety   []  History of major depression.  Physical  Examination  There were no vitals filed for this visit. There is no height or weight on file to calculate BMI. Gen: WD/WN, NAD Head: [place]/AT, No temporalis wasting.  Ear/Nose/Throat: Hearing grossly intact, nares w/o erythema or drainage Eyes: PER, EOMI, sclera nonicteric.  Neck: Supple, no large masses.   Pulmonary:  Good air movement, no audible wheezing bilaterally, no use of accessory muscles.  Cardiac: RRR, no JVD Vascular: scattered varicosities present bilaterally.  Mild venous stasis changes to the legs bilaterally.  2+ soft pitting edema Vessel Right Left  Radial Palpable Palpable  PT Palpable Palpable  DP Palpable Palpable  Gastrointestinal: Non-distended. No guarding/no peritoneal signs.  Musculoskeletal: M/S 5/5 throughout.  No deformity or atrophy.  Neurologic: CN 2-12 intact. Symmetrical.  Speech is fluent. Motor exam as listed above. Psychiatric: Judgment intact, Mood & affect appropriate for pt's clinical situation. Dermatologic: Mild venous rashes no ulcers noted.  No changes consistent with cellulitis. Lymph : No lichenification or skin changes of chronic lymphedema.  CBC Lab Results  Component Value Date   WBC 4.7 06/12/2020   HGB 13.7 06/12/2020   HCT 38.4 (L) 06/12/2020   MCV 91.2 06/12/2020   PLT 144 (L) 06/12/2020    BMET    Component Value Date/Time   NA 130 (L) 06/12/2020 0459   NA 140 03/02/2014 0420   K 4.8 06/12/2020 0459   K 4.1 03/02/2014 0420   CL 94 (L) 06/12/2020 0459   CL 105 03/02/2014 0420   CO2 26 06/12/2020 0459   CO2 30 03/02/2014 0420   GLUCOSE 140 (H) 06/12/2020 0459   GLUCOSE 105 (H) 03/02/2014 0420   BUN 27 (H) 06/12/2020 0459   BUN 11 03/02/2014 0420   CREATININE 1.09 06/12/2020 0459   CREATININE 0.92 03/02/2014 0420   CALCIUM 9.8 06/12/2020 0459   CALCIUM 7.9 (L) 03/02/2014 0420   GFRNONAA >60 06/12/2020 0459   GFRNONAA >60 03/02/2014 0420   GFRNONAA >60 04/10/2013 1711   GFRAA >60 10/30/2015 1703   GFRAA >60  03/02/2014 0420   GFRAA >60 04/10/2013 1711   CrCl cannot be calculated (Patient's most recent lab result is older than the maximum 21 days allowed.).  COAG Lab Results  Component Value Date   INR 0.99 10/23/2015   INR 0.9 03/22/2013    Radiology No results found.   Assessment/Plan 1. Lymphedema Recommend:  No surgery or intervention at this point in time.    I have reviewed my previous discussion with the patient regarding swelling and why it causes symptoms.  Patient will continue wearing graduated compression stockings class 1 (20-30 mmHg) on a daily basis. The patient will  beginning wearing the stockings first thing in the morning and removing them in the evening. The patient is instructed specifically not to sleep in the stockings.    In addition, behavioral modification including several periods of elevation of the lower extremities during the day will be continued.  This was reviewed with the patient during the initial visit.  The patient will also continue routine exercise, especially walking on a daily basis as was discussed during the initial visit.    Despite conservative treatments including graduated compression therapy class 1 and behavioral modification including exercise and elevation the patient  has not obtained adequate control of the lymphedema.  The patient still has stage 3 lymphedema and therefore, I believe that a lymph pump should be added to improve the control of the patient's lymphedema.  Additionally, a lymph pump is warranted because it will reduce the risk of cellulitis and ulceration in the future.  Patient should follow-up in six months    2. Chronic venous insufficiency Recommend:  No surgery or intervention at this point in time.    I have reviewed my previous discussion with the patient regarding swelling and why it causes symptoms.  Patient will continue wearing graduated compression stockings class 1 (20-30 mmHg) on a daily basis. The  patient will  beginning  wearing the stockings first thing in the morning and removing them in the evening. The patient is instructed specifically not to sleep in the stockings.    In addition, behavioral modification including several periods of elevation of the lower extremities during the day will be continued.  This was reviewed with the patient during the initial visit.  The patient will also continue routine exercise, especially walking on a daily basis as was discussed during the initial visit.    Despite conservative treatments including graduated compression therapy class 1 and behavioral modification including exercise and elevation the patient  has not obtained adequate control of the lymphedema.  The patient still has stage 3 lymphedema and therefore, I believe that a lymph pump should be added to improve the control of the patient's lymphedema.  Additionally, a lymph pump is warranted because it will reduce the risk of cellulitis and ulceration in the future.  Patient should follow-up in six months    3. Essential hypertension Continue antihypertensive medications as already ordered, these medications have been reviewed and there are no changes at this time.   4. Coronary artery disease involving native heart, unspecified vessel or lesion type, unspecified whether angina present Continue cardiac and antihypertensive medications as already ordered and reviewed, no changes at this time.  Continue statin as ordered and reviewed, no changes at this time  Nitrates PRN for chest pain   5. Mixed hyperlipidemia Continue statin as ordered and reviewed, no changes at this time    Hortencia Pilar, MD  07/30/2020 5:44 PM

## 2020-08-01 ENCOUNTER — Other Ambulatory Visit: Payer: Self-pay

## 2020-08-01 ENCOUNTER — Ambulatory Visit (INDEPENDENT_AMBULATORY_CARE_PROVIDER_SITE_OTHER): Payer: Medicare Other | Admitting: Vascular Surgery

## 2020-08-01 ENCOUNTER — Ambulatory Visit (INDEPENDENT_AMBULATORY_CARE_PROVIDER_SITE_OTHER): Payer: Medicare Other

## 2020-08-01 ENCOUNTER — Encounter (INDEPENDENT_AMBULATORY_CARE_PROVIDER_SITE_OTHER): Payer: Self-pay | Admitting: Vascular Surgery

## 2020-08-01 VITALS — BP 147/78 | HR 75 | Ht 67.0 in | Wt 245.0 lb

## 2020-08-01 DIAGNOSIS — I872 Venous insufficiency (chronic) (peripheral): Secondary | ICD-10-CM | POA: Diagnosis not present

## 2020-08-01 DIAGNOSIS — I10 Essential (primary) hypertension: Secondary | ICD-10-CM

## 2020-08-01 DIAGNOSIS — I890 Lymphedema, not elsewhere classified: Secondary | ICD-10-CM

## 2020-08-01 DIAGNOSIS — E782 Mixed hyperlipidemia: Secondary | ICD-10-CM

## 2020-08-01 DIAGNOSIS — I2510 Atherosclerotic heart disease of native coronary artery without angina pectoris: Secondary | ICD-10-CM

## 2020-09-16 ENCOUNTER — Other Ambulatory Visit: Payer: Self-pay | Admitting: Physical Medicine and Rehabilitation

## 2020-09-16 DIAGNOSIS — M5416 Radiculopathy, lumbar region: Secondary | ICD-10-CM

## 2020-09-24 ENCOUNTER — Ambulatory Visit
Admission: RE | Admit: 2020-09-24 | Discharge: 2020-09-24 | Disposition: A | Discharge status: Home / Self Care | Payer: Medicare Other | Source: Ambulatory Visit | Attending: Physical Medicine and Rehabilitation | Admitting: Physical Medicine and Rehabilitation

## 2020-09-24 ENCOUNTER — Other Ambulatory Visit: Payer: Self-pay

## 2020-09-24 DIAGNOSIS — M5416 Radiculopathy, lumbar region: Secondary | ICD-10-CM | POA: Diagnosis present

## 2020-10-31 ENCOUNTER — Telehealth (INDEPENDENT_AMBULATORY_CARE_PROVIDER_SITE_OTHER): Payer: Self-pay

## 2020-10-31 ENCOUNTER — Encounter (INDEPENDENT_AMBULATORY_CARE_PROVIDER_SITE_OTHER): Payer: Self-pay

## 2020-10-31 NOTE — Telephone Encounter (Signed)
Pt called and left a VM on the nurses line saying that he has yet to hear anything about his Compression pumps and its been 3 months I looked in the pt's chart an I didn't seen a Bio tab form had been sent off so I made the pt aware that I would start the process an that it would take some time to actually get the compression pumps. The pt voiced understanding.

## 2021-01-03 DIAGNOSIS — R29898 Other symptoms and signs involving the musculoskeletal system: Secondary | ICD-10-CM | POA: Insufficient documentation

## 2021-01-03 DIAGNOSIS — R2689 Other abnormalities of gait and mobility: Secondary | ICD-10-CM | POA: Insufficient documentation

## 2021-01-06 ENCOUNTER — Other Ambulatory Visit (HOSPITAL_COMMUNITY): Payer: Self-pay | Admitting: Medical

## 2021-01-06 ENCOUNTER — Other Ambulatory Visit: Payer: Self-pay | Admitting: Medical

## 2021-01-06 DIAGNOSIS — R2689 Other abnormalities of gait and mobility: Secondary | ICD-10-CM

## 2021-01-12 DIAGNOSIS — T84032A Mechanical loosening of internal right knee prosthetic joint, initial encounter: Secondary | ICD-10-CM | POA: Insufficient documentation

## 2021-01-15 DIAGNOSIS — G5623 Lesion of ulnar nerve, bilateral upper limbs: Secondary | ICD-10-CM | POA: Insufficient documentation

## 2021-01-17 ENCOUNTER — Other Ambulatory Visit: Payer: Self-pay | Admitting: Orthopedic Surgery

## 2021-01-17 DIAGNOSIS — T84032A Mechanical loosening of internal right knee prosthetic joint, initial encounter: Secondary | ICD-10-CM

## 2021-01-22 ENCOUNTER — Other Ambulatory Visit: Payer: Self-pay

## 2021-01-22 ENCOUNTER — Ambulatory Visit
Admission: RE | Admit: 2021-01-22 | Discharge: 2021-01-22 | Disposition: A | Discharge status: Home / Self Care | Payer: Medicare Other | Source: Ambulatory Visit | Attending: Medical | Admitting: Medical

## 2021-01-22 DIAGNOSIS — R2689 Other abnormalities of gait and mobility: Secondary | ICD-10-CM

## 2021-01-27 ENCOUNTER — Encounter
Admission: RE | Admit: 2021-01-27 | Discharge: 2021-01-27 | Disposition: A | Discharge status: Home / Self Care | Payer: Medicare Other | Source: Ambulatory Visit | Attending: Orthopedic Surgery | Admitting: Orthopedic Surgery

## 2021-01-27 ENCOUNTER — Other Ambulatory Visit: Payer: Self-pay

## 2021-01-27 DIAGNOSIS — T84032A Mechanical loosening of internal right knee prosthetic joint, initial encounter: Secondary | ICD-10-CM | POA: Diagnosis not present

## 2021-01-27 MED ORDER — TECHNETIUM TC 99M MEDRONATE IV KIT
20.0000 | PACK | Freq: Once | INTRAVENOUS | Status: AC | PRN
  Administered 2021-01-27: 09:00:00 22.08 via INTRAVENOUS

## 2021-01-28 NOTE — Progress Notes (Signed)
MRN : [id number]  Johnny Mccoy is a 75 y.o. ([birth year]) male who presents with chief complaint of check swelling.  History of Present Illness:   The patient returns to the office for followup evaluation regarding leg swelling.  The swelling has persisted but with the lymph pump is much, much better controlled. The pain associated with swelling is essentially eliminated. There have not been any interval development of a ulcerations or wounds.  The patient denies problems with the pump, noting it is working well and the leggings are in good condition.  Since the previous visit the patient has been wearing graduated compression stockings and using the lymph pump on a routine basis and  has noted significant improvement in the lymphedema.   Patient stated the lymph pump has been a very positive factor in her care.    No outpatient medications have been marked as taking for the 01/30/21 encounter (Appointment) with Delana Meyer, Dolores Lory, MD.    Past Medical History:  Diagnosis Date   Anxiety    takes Lorazepam daily   Arthritis    Asthma    Albuterol as needed.Breo daily as well as Flovent   Blood clot in vein    after r knee replacement.  filter placed and removed.   CAD (coronary artery disease)    Cataract    Cataracts, bilateral    immature   Chronic back pain    compression   Complication of anesthesia    seizures after partial knee in 2014.Surgery was in am and an still in post op until 9 that night. 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 takes Lamotrigine daily. Last seizure 6+ yrs ago   Sleep apnea    CPAP    Past Surgical History:  Procedure Laterality Date   ANKLE ARTHROSCOPY Left 03/07/2020   Procedure: ANKLE ARTHROSCOPY;  Surgeon: Caroline More, DPM;  Location: [facility];  Service: Podiatry;  Laterality: Left;   BACK SURGERY     x3   CARDIAC CATHETERIZATION     2015   CARPAL TUNNEL RELEASE     COLONOSCOPY     CORONARY ANGIOPLASTY WITH STENT PLACEMENT     x 3   dental implants     ESOPHAGOGASTRODUODENOSCOPY     EYE SURGERY Bilateral    EYE SURGERY     both eyes in 60's d/t muscle causing to squint   HERNIA REPAIR     umbilical   JOINT REPLACEMENT Right    knee   KNEE ARTHROSCOPY     LEFT HEART CATH AND CORONARY ANGIOGRAPHY Left 04/12/2019   Procedure: LEFT HEART CATH AND CORONARY ANGIOGRAPHY;  Surgeon: Serafina Royals  J, MD;  Location: [facility];  Service: Cardiovascular;  Laterality: Left;   MEDIAL PARTIAL KNEE REPLACEMENT Right    MEDIASTERNOTOMY N/A 10/28/2015   Procedure: PARTIAL STERNOTOMY;  Surgeon: [name], MD;  Location: [facility];  Service: Thoracic;  Laterality: N/A;   numerous hand surgery     ORIF ANKLE FRACTURE Left 03/07/2020   Procedure: OPEN REDUCTION INTERNAL FIXATION (ORIF) ANKLE FRACTURE;  Surgeon: Caroline More, DPM;  Location: [facility];  Service: Podiatry;  Laterality: Left;   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   Procedure: RESECTION OF anterier MEDIASTINAL TUMOR;  Surgeon: [name], MD;  Location: [place] [facility];  Service: Thoracic;  Laterality: N/A;   right hand fusion     rotorblator  03/1997   anteriograde amnesia resulted   TENDON REPAIR Left 03/07/2020   Procedure: FLEXOR TENDON REPAIR;  Surgeon: Caroline More, DPM;  Location:  [place];  Service: Podiatry;  Laterality: Left;  sleep apnea   TONSILLECTOMY      Social History Social History   Tobacco Use   Smoking status: Never   Smokeless tobacco: Never  Vaping Use   Vaping Use: Never used  Substance Use Topics   Alcohol use: Yes    Alcohol/week: 14.0 standard drinks    Types: 14 Shots of liquor per week    Comment: wine and couple shots of liquor each evening   Drug use: No    Family History Family History  Problem Relation Age of Onset   Cancer Father 17       Bone    COPD Father    Heart disease Mother    Hyperlipidemia Mother    Stroke Mother    Heart disease Maternal Grandfather    Hyperlipidemia Maternal Grandfather     Allergies  Allergen Reactions   Fentanyl Other (See Comments)    Possible involuntary tremors   Methocarbamol Other (See Comments)    confusion     REVIEW OF SYSTEMS (Negative unless checked)  Constitutional: [] Weight loss  [] Fever  [] Chills Cardiac: [] Chest pain   [] Chest pressure   [] Palpitations   [] Shortness of breath when laying flat   [] Shortness of breath with exertion. Vascular:  [] Pain in legs with walking   [] Pain in legs at rest  [] History of DVT   [] Phlebitis   [x] Swelling in legs   [] Varicose veins   [] Non-healing ulcers Pulmonary:   [] Uses home oxygen   [] Productive cough   [] Hemoptysis   [] Wheeze  [] COPD   [] Asthma Neurologic:  [] Dizziness   [] Seizures   [] History of stroke   [] History of TIA  [] Aphasia   [] Vissual changes   [] Weakness or numbness in arm   [] Weakness or numbness in leg Musculoskeletal:   [] Joint swelling   [x] Joint pain   [] Low back pain Hematologic:  [] Easy bruising  [] Easy bleeding   [] Hypercoagulable state   [] Anemic Gastrointestinal:  [] Diarrhea   [] Vomiting  [x] Gastroesophageal reflux/heartburn   [] Difficulty swallowing. Genitourinary:  [] Chronic kidney disease   [] Difficult urination  [] Frequent urination   [] Blood in urine Skin:  [] Rashes   [] Ulcers  Psychological:   [] History of anxiety   []  History of major depression.  Physical Examination  There were no vitals filed for this visit. There is no height or weight on file to calculate BMI. Gen: WD/WN, NAD Head: [place]/AT, No temporalis wasting.  Ear/Nose/Throat: Hearing grossly intact, nares w/o erythema or drainage, pinna without lesions Eyes: PER, EOMI, sclera nonicteric.  Neck: Supple,  no gross masses.  No JVD.  Pulmonary:  Good air movement, no audible wheezing, no use of accessory muscles.  Cardiac: RRR, precordium not hyperdynamic. Vascular:  scattered varicosities present bilaterally.  Mild venous stasis changes to the legs bilaterally.  2-3+ soft pitting edema  Vessel Right Left  Radial Palpable Palpable  Gastrointestinal: soft, non-distended. No guarding/no peritoneal signs.  Musculoskeletal: M/S 5/5 throughout.  No deformity.  Neurologic: CN 2-12 intact. Pain and light touch intact in extremities.  Symmetrical.  Speech is fluent. Motor exam as listed above. Psychiatric: Judgment intact, Mood & affect appropriate for pt's clinical situation. Dermatologic: Venous rashes no ulcers noted.  No changes consistent with cellulitis. Lymph : No lichenification or skin changes of chronic lymphedema.  CBC Lab Results  Component Value Date   WBC 4.7 06/12/2020   HGB 13.7 06/12/2020   HCT 38.4 (L) 06/12/2020   MCV 91.2 06/12/2020   PLT 144 (L) 06/12/2020    BMET    Component Value Date/Time   NA 130 (L) 06/12/2020 0459   NA 140 03/02/2014 0420   K 4.8 06/12/2020 0459   K 4.1 03/02/2014 0420   CL 94 (L) 06/12/2020 0459   CL 105 03/02/2014 0420   CO2 26 06/12/2020 0459   CO2 30 03/02/2014 0420   GLUCOSE 140 (H) 06/12/2020 0459   GLUCOSE 105 (H) 03/02/2014 0420   BUN 27 (H) 06/12/2020 0459   BUN 11 03/02/2014 0420   CREATININE 1.09 06/12/2020 0459   CREATININE 0.92 03/02/2014 0420   CALCIUM 9.8 06/12/2020 0459   CALCIUM 7.9 (L) 03/02/2014 0420   GFRNONAA >60 06/12/2020 0459   GFRNONAA  >60 03/02/2014 0420   GFRNONAA >60 04/10/2013 1711   GFRAA >60 10/30/2015 1703   GFRAA >60 03/02/2014 0420   GFRAA >60 04/10/2013 1711   CrCl cannot be calculated (Patient's most recent lab result is older than the maximum 21 days allowed.).  COAG Lab Results  Component Value Date   INR 0.99 10/23/2015   INR 0.9 03/22/2013    Radiology MR BRAIN WO CONTRAST  Result Date: 01/22/2021 CLINICAL DATA:  Other abnormalities of gait and mobility. Additional history provided by scanning technologist: Patient reports issues with both hands for 3 months, patient reports fingers stiffen and lock up "into a claw." EXAM: MRI HEAD WITHOUT CONTRAST TECHNIQUE: Multiplanar, multiecho pulse sequences of the brain and surrounding structures were obtained without intravenous contrast. COMPARISON:  Head CT 12/21/2019. FINDINGS: Brain: Mild intermittent motion degradation. Mild generalized cerebral and cerebellar atrophy. No cortical encephalomalacia is identified. No significant cerebral white matter disease. There is no acute infarct. No evidence of an intracranial mass. No chronic intracranial blood products. No extra-axial fluid collection. No midline shift. Vascular: Maintained flow voids within the proximal large arterial vessels. Skull and upper cervical spine: No focal suspicious marrow lesion. Incompletely assessed cervical spondylosis. Sinuses/Orbits: Visualized orbits show no acute finding. Bilateral lens replacements. Minimal mucosal thickening within the right frontal, bilateral ethmoid, bilateral sphenoid and bilateral maxillary sinuses. Other: Small bilateral mastoid effusions. IMPRESSION: No evidence of acute intracranial abnormality. Mild generalized cerebral and cerebellar atrophy. Minimal paranasal sinus mucosal thickening, as described. Small bilateral mastoid effusions. Electronically Signed   By: Kellie Simmering D.O.   On: 01/22/2021 11:04   MR CERVICAL SPINE WO CONTRAST  Result Date:  01/22/2021 CLINICAL DATA:  Other abnormalities of gait and mobility. Additional history provided by scanning technologist: Patient reports issues with both hands for 3 months. Patient reports fingers stiffen and "lock into  a claw." EXAM: MRI CERVICAL SPINE WITHOUT CONTRAST TECHNIQUE: Multiplanar, multisequence MR imaging of the cervical spine was performed. No intravenous contrast was administered. COMPARISON:  No pertinent prior exams available for comparison. FINDINGS: Mild intermittent motion degradation. Alignment: Straightening of the expected cervical lordosis. No significant spondylolisthesis. Vertebrae: Vertebral body height is maintained. Mild multilevel degenerative endplate irregularity. No significant marrow edema or focal suspicious osseous lesion. Cord: No signal abnormality identified within the cervical spinal cord. Posterior Fossa, vertebral arteries, paraspinal tissues: Posterior fossa better assessed on same-day brain MRI. Flow voids preserved within the imaged cervical vertebral arteries. Paraspinal soft tissues unremarkable. Disc levels: Multilevel disc degeneration. Most notably, there is moderate disc degeneration at C3-C4 and mild-to-moderate disc degeneration at the C4-C5 through C7-T1 levels. C2-C3: Shallow disc bulge. Mild facet arthrosis on the left. No significant spinal canal or foraminal stenosis. C3-C4: Shallow posterior disc osteophyte complex with right-sided disc osteophyte ridge/uncinate hypertrophy. Minimal facet arthrosis. Mild relative spinal canal narrowing with possible contact upon the ventral spinal cord. Mild/moderate bilateral neural foraminal narrowing. C4-C5: Disc bulge. Superimposed broad-based left center disc protrusion. Mild bilateral uncovertebral hypertrophy. Minimal facet arthrosis. Mild relative spinal canal narrowing. The disc protrusion may contact the ventral aspect of the spinal cord. Mild left neural foraminal narrowing. C5-C6: Shallow disc bulge.  Uncovertebral hypertrophy on the right. Minimal facet arthrosis. No significant spinal canal stenosis. Moderate right neural foraminal narrowing. C6-C7: Shallow disc bulge. Right-sided disc osteophyte ridge/uncinate hypertrophy. Mild uncovertebral hypertrophy also present on the left. No significant spinal canal stenosis. Bilateral neural foraminal narrowing (moderate/severe right, mild left). C7-T1: Shallow disc bulge. Right-sided disc osteophyte ridge. No significant spinal canal stenosis. Moderate right neural foraminal narrowing. IMPRESSION: Cervical spondylosis, as outlined. No more than mild spinal canal stenosis. Multilevel foraminal stenosis, as detailed and greatest bilaterally at C3-C4 (mild/moderate), on the right at C5-C6 (moderate), on the right at C6-C7 (moderate/severe) and on the right at C7-T1 (moderate). Straightening of the expected cervical lordosis. Electronically Signed   By: Kellie Simmering D.O.   On: 01/22/2021 12:03   NM Bone Scan 3 Phase  Result Date: 01/27/2021 CLINICAL DATA:  RIGHT knee pain and swelling, fell in August of 2022. Prior RIGHT total knee arthroplasty in 2015 EXAM: NUCLEAR MEDICINE 3-PHASE BONE SCAN TECHNIQUE: Radionuclide angiographic images, immediate static blood pool images, and 3-hour delayed static images were obtained of the knees after intravenous injection of radiopharmaceutical. CT imaging of the knees was performed concurrently. Fused SPECT/CT data sets are reviewed in 3 planes. RADIOPHARMACEUTICALS:  22.08 mCi Tc-17m MDP IV COMPARISON:  Concurrent CT FINDINGS: Vascular phase: Increased blood flow to periarticular regions of RIGHT knee Blood pool phase: Increased blood pool RIGHT knee at the proximal tibia and medial compartment Delayed phase: No abnormal tracer uptake LEFT knee. Marked increased tracer uptake at medial tibial plateau and proximal tibial metaphysis consistent with aseptic loosening or infection. No abnormal tracer uptake seen adjacent to the  femoral component of the prosthesis. Additional CT findings: Large RIGHT knee joint effusion. No definite fractures. IMPRESSION: Abnormal blood flow, blood pool and delayed uptake of tracer in the proximal tibia adjacent to the RIGHT knee prosthesis consistent with either aseptic loosening or infection. Large RIGHT knee joint effusion. Electronically Signed   By: Lavonia Dana M.D.   On: 01/27/2021 16:40     Assessment/Plan 1. Lymphedema  No surgery or intervention at this point in time.    I have reviewed my discussion with the patient regarding lymphedema and why it  causes symptoms.  Patient will continue wearing graduated compression stockings class 1 (20-30 mmHg) on a daily basis a prescription was given. The patient is reminded to put the stockings on first thing in the morning and removing them in the evening. The patient is instructed specifically not to sleep in the stockings.   In addition, behavioral modification throughout the day will be continued.  This will include frequent elevation (such as in a recliner), use of over the counter pain medications as needed and exercise such as walking.  I have reviewed systemic causes for chronic edema such as liver, kidney and cardiac etiologies and there does not appear to be any significant changes in these organ systems over the past year.  The patient is under the impression that these organ systems are all stable and unchanged.    The patient will continue aggressive use of the  lymph pump.  This will continue to improve the edema control and prevent sequela such as ulcers and infections.   The patient will follow-up with me with a venous duplex prior to his redo knee surgery   - VAS [place] LOWER EXTREMITY VENOUS (DVT); Future  2. Mixed hyperlipidemia Continue statin as ordered and reviewed, no changes at this time   3. Essential hypertension Continue antihypertensive medications as already ordered, these medications have been reviewed and  there are no changes at this time.   4. Coronary artery disease involving native heart, unspecified vessel or lesion type, unspecified whether angina present Continue cardiac and antihypertensive medications as already ordered and reviewed, no changes at this time.  Continue statin as ordered and reviewed, no changes at this time  Nitrates PRN for chest pain   5. Gastroesophageal reflux disease without esophagitis Continue PPI as already ordered, this medication has been reviewed and there are no changes at this time.  Avoidence of caffeine and alcohol  Moderate elevation of the head of the bed      Hortencia Pilar, MD  01/28/2021 6:54 PM

## 2021-01-30 ENCOUNTER — Ambulatory Visit (INDEPENDENT_AMBULATORY_CARE_PROVIDER_SITE_OTHER): Payer: Medicare Other | Admitting: Vascular Surgery

## 2021-01-30 ENCOUNTER — Other Ambulatory Visit: Payer: Self-pay

## 2021-01-30 VITALS — BP 151/75 | HR 72 | Ht 67.0 in | Wt 258.0 lb

## 2021-01-30 DIAGNOSIS — I890 Lymphedema, not elsewhere classified: Secondary | ICD-10-CM

## 2021-01-30 DIAGNOSIS — E782 Mixed hyperlipidemia: Secondary | ICD-10-CM | POA: Diagnosis not present

## 2021-01-30 DIAGNOSIS — I10 Essential (primary) hypertension: Secondary | ICD-10-CM | POA: Diagnosis not present

## 2021-01-30 DIAGNOSIS — K219 Gastro-esophageal reflux disease without esophagitis: Secondary | ICD-10-CM

## 2021-01-30 DIAGNOSIS — I2510 Atherosclerotic heart disease of native coronary artery without angina pectoris: Secondary | ICD-10-CM

## 2021-02-02 ENCOUNTER — Encounter (INDEPENDENT_AMBULATORY_CARE_PROVIDER_SITE_OTHER): Payer: Self-pay | Admitting: Vascular Surgery

## 2021-02-26 ENCOUNTER — Encounter
Admission: RE | Admit: 2021-02-26 | Discharge: 2021-02-26 | Disposition: A | Discharge status: Home / Self Care | Payer: Medicare Other | Source: Ambulatory Visit | Attending: Orthopedic Surgery | Admitting: Orthopedic Surgery

## 2021-02-26 ENCOUNTER — Encounter (INDEPENDENT_AMBULATORY_CARE_PROVIDER_SITE_OTHER): Payer: Self-pay | Admitting: Vascular Surgery

## 2021-02-26 ENCOUNTER — Telehealth (INDEPENDENT_AMBULATORY_CARE_PROVIDER_SITE_OTHER): Payer: Self-pay

## 2021-02-26 ENCOUNTER — Other Ambulatory Visit: Payer: Self-pay

## 2021-02-26 VITALS — BP 114/57 | HR 61 | Resp 15 | Ht 67.0 in | Wt 241.0 lb

## 2021-02-26 DIAGNOSIS — T84032A Mechanical loosening of internal right knee prosthetic joint, initial encounter: Secondary | ICD-10-CM | POA: Insufficient documentation

## 2021-02-26 DIAGNOSIS — Y838 Other surgical procedures as the cause of abnormal reaction of the patient, or of later complication, without mention of misadventure at the time of the procedure: Secondary | ICD-10-CM | POA: Insufficient documentation

## 2021-02-26 DIAGNOSIS — Z01818 Encounter for other preprocedural examination: Secondary | ICD-10-CM | POA: Insufficient documentation

## 2021-02-26 DIAGNOSIS — D696 Thrombocytopenia, unspecified: Secondary | ICD-10-CM | POA: Insufficient documentation

## 2021-02-26 DIAGNOSIS — T84032D Mechanical loosening of internal right knee prosthetic joint, subsequent encounter: Secondary | ICD-10-CM

## 2021-02-26 DIAGNOSIS — Z01810 Encounter for preprocedural cardiovascular examination: Secondary | ICD-10-CM | POA: Diagnosis not present

## 2021-02-26 DIAGNOSIS — N178 Other acute kidney failure: Secondary | ICD-10-CM

## 2021-02-26 DIAGNOSIS — N179 Acute kidney failure, unspecified: Secondary | ICD-10-CM | POA: Insufficient documentation

## 2021-02-26 DIAGNOSIS — E876 Hypokalemia: Secondary | ICD-10-CM | POA: Diagnosis not present

## 2021-02-26 DIAGNOSIS — Z86711 Personal history of pulmonary embolism: Secondary | ICD-10-CM | POA: Insufficient documentation

## 2021-02-26 LAB — COMPREHENSIVE METABOLIC PANEL
ALT: 35 U/L (ref 0–44)
AST: 20 U/L (ref 15–41)
Albumin: 4.1 g/dL (ref 3.5–5.0)
Alkaline Phosphatase: 49 U/L (ref 38–126)
Anion gap: 9 (ref 5–15)
BUN: 27 mg/dL — ABNORMAL HIGH (ref 8–23)
CO2: 25 mmol/L (ref 22–32)
Calcium: 9.4 mg/dL (ref 8.9–10.3)
Chloride: 92 mmol/L — ABNORMAL LOW (ref 98–111)
Creatinine, Ser: 1.08 mg/dL (ref 0.61–1.24)
GFR, Estimated: >60 mL/min (ref >60)
Glucose, Bld: 130 mg/dL — ABNORMAL HIGH (ref 70–99)
Potassium: 4.5 mmol/L (ref 3.5–5.1)
Sodium: 126 mmol/L — ABNORMAL LOW (ref 135–145)
Total Bilirubin: 0.9 mg/dL (ref 0.3–1.2)
Total Protein: 7 g/dL (ref 6.5–8.1)

## 2021-02-26 LAB — SEDIMENTATION RATE: Sed Rate: 5 mm/hr (ref 0–20)

## 2021-02-26 LAB — PROTIME-INR
INR: 1 (ref 0.8–1.2)
Prothrombin Time: 12.9 seconds (ref 11.4–15.2)

## 2021-02-26 LAB — URINALYSIS, ROUTINE W REFLEX MICROSCOPIC
Bilirubin Urine: NEGATIVE
Glucose, UA: NEGATIVE mg/dL
Hgb urine dipstick: NEGATIVE
Ketones, ur: NEGATIVE mg/dL
Leukocytes,Ua: NEGATIVE
Nitrite: NEGATIVE
Protein, ur: NEGATIVE mg/dL
Specific Gravity, Urine: 1.009 (ref 1.005–1.030)
pH: 6 (ref 5.0–8.0)

## 2021-02-26 LAB — C-REACTIVE PROTEIN: CRP: 1.3 mg/dL — ABNORMAL HIGH (ref <1.0)

## 2021-02-26 LAB — TYPE AND SCREEN
ABO/RH(D): A NEG
Antibody Screen: NEGATIVE

## 2021-02-26 LAB — CBC
HCT: 41.8 % (ref 39.0–52.0)
Hemoglobin: 15.2 g/dL (ref 13.0–17.0)
MCH: 34 pg (ref 26.0–34.0)
MCHC: 36.4 g/dL — ABNORMAL HIGH (ref 30.0–36.0)
MCV: 93.5 fL (ref 80.0–100.0)
Platelets: 178 10*3/uL (ref 150–400)
RBC: 4.47 MIL/uL (ref 4.22–5.81)
RDW: 12.4 % (ref 11.5–15.5)
WBC: 8.3 10*3/uL (ref 4.0–10.5)
nRBC: 0 % (ref 0.0–0.2)

## 2021-02-26 LAB — SURGICAL PCR SCREEN
MRSA, PCR: NEGATIVE
Staphylococcus aureus: POSITIVE — AB

## 2021-02-26 LAB — APTT: aPTT: 20 seconds — ABNORMAL LOW (ref 24–36)

## 2021-02-26 NOTE — Patient Instructions (Signed)
Your procedure is scheduled on: 03/10/21 Report to [facility]. To find out your arrival time please call [phone] between 1PM - 3PM on 03/07/21.  Remember: Instructions that are not followed completely may result in serious medical risk, up to and including death, or upon the discretion of your surgeon and anesthesiologist your surgery may need to be rescheduled.     _X__ 1. Do not eat food after midnight the night before your procedure.                 No gum chewing or hard candies. You may drink clear liquids up to 2 hours                 before you are scheduled to arrive for your surgery- DO not drink clear                 liquids within 2 hours of the start of your surgery.                 Clear Liquids include:  water, apple juice without pulp, clear carbohydrate                 drink such as Clearfast or Gatorade, Black Coffee or Tea (Do not add                 anything to coffee or tea). Diabetics water only  DRINK THE ENSURE "CLEAR" PRE SURGERY DRINK 2 HOURS BEFORE ARRIVING TO SURGERY  __X__2.  On the morning of surgery brush your teeth with toothpaste and water, you                 may rinse your mouth with mouthwash if you wish.  Do not swallow any              toothpaste of mouthwash.     _X__ 3.  No Alcohol for 24 hours before or after surgery.   _X__ 4.  Do Not Smoke or use e-cigarettes For 24 Hours Prior to Your Surgery.                 Do not use any chewable tobacco products for at least 6 hours prior to                 surgery.  ____  5.  Bring all medications with you on the day of surgery if instructed.   __X__  6.  Notify your doctor if there is any change in your medical condition      (cold, fever, infections).     Do not wear jewelry, make-up, hairpins, clips or nail polish. Do not wear lotions, powders, or perfumes.  Do not shave body hair 48 hours prior to surgery. Men may shave face and neck. Do not  bring valuables to the hospital.    [facility] is not responsible for any belongings or valuables.  Contacts, dentures/partials or body piercings may not be worn into surgery. Bring a case for your contacts, glasses or hearing aids, a denture cup will be supplied. Leave your suitcase in the car. After surgery it may be brought to your room. For patients admitted to the hospital, discharge time is determined by your treatment team.   Patients discharged the day of surgery will not be allowed to drive home.   Please read over the following fact sheets that you were given:   MRSA Information,  CHG soap, Incentive Spirometer, Ensure  __X__ Take these medicines the morning of surgery with A SIP OF WATER:    1. DULoxetine (CYMBALTA) 30 MG capsule  2. lansoprazole (PREVACID) 30 MG capsule  3. LORazepam (ATIVAN) 0.5 MG tablet  4. memantine (NAMENDA XR) 28 MG CP24 24 hr capsule  5. metoprolol succinate (TOPROL-XL) 50 MG 24 hr tablet  6. olmesartan (BENICAR) 40 MG tablet  7. valACYclovir (VALTREX) 1000 MG tablet if needed  8. HYDROcodone-acetaminophen (NORCO/VICODIN) 5-325 MG tablet  ____ Fleet Enema (as directed)   __X__ Use CHG Soap/SAGE wipes as directed  __X__ Use inhalers on the day of surgery  USE YOUR NEBULIZER THE MORNING OF SURGERY  ____ Stop metformin/Janumet/Farxiga 2 days prior to surgery    ____ Take 1/2 of usual insulin dose the night before surgery. No insulin the morning          of surgery.   ____ Stop Blood Thinners Coumadin/Plavix/Xarelto/Pleta/Pradaxa/Eliquis/Effient/Aspirin  on   Or contact your Surgeon, Cardiologist or Medical Doctor regarding  ability to stop your blood thinners  __X__ Stop Anti-inflammatories 7 days before surgery such as Advil, Ibuprofen, Motrin,  BC or Goodies Powder, Naprosyn, Naproxen, Aleve, Aspirin   YOU MAY USE TYLENOL IF NEEDED  __X__ Stop all herbals or supplements, fish oil or vitamins  for 1 week prior to surgery RED YEAST  RICE  __X__ Bring C-Pap to the hospital.

## 2021-02-26 NOTE — Telephone Encounter (Signed)
Patient is scheduled with Dr. Delana Meyer for a IVC filter placement on 01/03//23 at [facility]. Patient has been informed of this procedure.

## 2021-02-26 NOTE — Telephone Encounter (Signed)
Patient called speaking of having an IVC filter placed before his knee replacement on 03/10/21. Spoke with Eulogio Ditch NP and the patient has been scheduled with Dr. Delana Meyer for 03/04/21 with a 12:00 pm arrival time to the [facility]. Pre-procedure instructions were discussed and will be mailed.

## 2021-02-28 ENCOUNTER — Encounter: Payer: Self-pay | Admitting: Orthopedic Surgery

## 2021-02-28 NOTE — Progress Notes (Signed)
Perioperative Services  Pre-Admission/Anesthesia Testing Clinical Review  Date: 03/04/21  Patient Demographics:  Name: Johnny Mccoy DOB:   1945-03-30 MRN:   [id number]  Planned Surgical Procedure(s):    Case: [id number] Date/Time: 03/10/21 1117   Procedure: TOTAL KNEE REVISION (Right: Knee)   Anesthesia type: Choice   Pre-op diagnosis:      Loosening of prosthesis of right total knee replacement, initial encounter  T84.032A     Status post revision of total knee replacement, right   Location: [facility] OR ROOM 01 / [place] [facility]   Surgeons: Dereck Leep, MD   NOTE: Available PAT nursing documentation and vital signs have been reviewed. Clinical nursing staff has updated patient's PMH/PSHx, current medication list, and drug allergies/intolerances to ensure comprehensive history available to assist in medical decision making as it pertains to the aforementioned surgical procedure and anticipated anesthetic course. Extensive review of available clinical information performed. [place] [facility] and PSHx updated with any diagnoses/procedures that  may have been inadvertently omitted during his intake with the pre-admission testing department's nursing staff.  Clinical Discussion:  Johnny Mccoy is a 75 y.o. male who is submitted for pre-surgical anesthesia review and clearance prior to him undergoing the above procedure. Patient has never been a smoker. Pertinent PMH includes: HFpEF, aortic stenosis, RBBB, VTE, CVA, HTN, HLD, asthma, OSAH (requires nocturnal PAP therapy), GERD (no daily Tx), seizures, chronic venous insufficiency, essential tremor, chronic back pain, lymphedema, anxiety (on daily BZO).   Patient is followed by cardiology Saralyn Pilar, MD). He was last seen in the cardiology clinic on 11/01/2020; notes reviewed.  At the time of his clinic visit, patient reported to be doing "okay".  He denied any episodes of chest pain, however has been experiencing episodes of  exertional dyspnea.  He denied any PND, orthopnea, palpitations, vertiginous symptoms, or presyncope/syncope.  Patient with bilateral peripheral edema for which she was taking diuretics.  PMH significant for cardiovascular diagnoses.  Patient has undergone multiple cardiac catheterizations (?? x 8) with stent placements (?? x 3) in the past. Records for all of his interventions are unavailable for review at time of consult. I do see where he had a 2.75 x 32 mm Taxus stent placed to the RCA on 01/30/2003.   Myocardial perfusion imaging study performed on 06/20/2015 revealed a low normal LVEF 52%.  There was normal myocardial thickening and wall motion.  There was no evidence of stress-induced myocardial ischemia or arrhythmia, however there was some artifact noted on exam.  Study was determined to be normal and low risk overall.  Last diagnostic left heart catheterization on 04/12/2019 revealing a normal LVEF of 60%.  There was multivessel CAD; 45% mid LCx, 40% proximal LAD, 45% mid LAD, 20% distal RCA, 20% ostial to mid RCA, and 30% mid RCA.  Intervention was deferred opting for aggressive medical management.  Last TTE was performed on 06/04/2020 revealing normal left ventricular systolic function with mild LVH.  LVEF 50-55%.  There was trivial mitral and mild tricuspid/pulmonary valve regurgitation.  Additionally, there was mild aortic stenosis noted; mean pressure gradient 9.6 mmHg; AVA (VTI) equals 1.91 cm.  Blood pressure elevated at 150/72 on prescribed beta-blocker, ARB, and diuretic therapies.  Patient is on a PCSK9i for his HLD diagnosis and further ASCVD prevention.  Patient has as needed SL nitrates to use for anginal episodes; denied recent use. He is not diabetic. Functional capacity, as defined by DASI, is documented as being >/= 4 METS.  No changes  were made to his medication regimen.  Patient follow-up with his outpatient cardiologist in 4 months or sooner if needed.  Johnny Mccoy is  scheduled for an elective RIGHT TOTAL KNEE REVISION on 03/10/2021 with Dr. Skip Estimable, MD. Given patient's past medical history significant for cardiovascular diagnoses, presurgical cardiac clearance was sought by the PAT team. Per cardiology, "this patient is optimized for surgery and may proceed with the planned procedural course with a LOW risk of significant perioperative cardiovascular complications".  In review of his medication reconciliation, it is noted the patient is on daily antiplatelet therapy.  He has been instructed on recommendations from his cardiologist and orthopedic surgeon for holding his daily low-dose ASA prior to his procedure.  Patient reports previous perioperative complications with anesthesia in the past. He has experienced (+) postoperative amnesia, (+) "seizures"/fentanyl induced tremors, and (+) anesthesia awareness. In review of the available records, it is noted that patient underwent a general anesthetic course at [facility] ([facility] in 03/2020 without documented complications.   Vitals with BMI 02/26/2021 01/30/2021 09/24/2020  Height 5\' 7"  5\' 7"  -  Weight 241 lbs 258 lbs 245 lbs  BMI 07.37 10.6 -  Systolic 269 485 -  Diastolic 57 75 -  Pulse 61 72 -  Some encounter information is confidential and restricted. Go to Review Flowsheets activity to see all data.    Providers/Specialists:   NOTE: Primary physician provider listed below. Patient may have been seen by APP or partner within same practice.   PROVIDER ROLE / SPECIALTY LAST OV  Hooten, Laurice Record, MD Orthopedic Surgeon 02/12/2021  Idelle Crouch, MD Primary Care Provider 01/17/2021  Isaias Cowman, MD Cardiology 11/01/2020   Allergies:  Fentanyl and Methocarbamol  Current Home Medications:   No current facility-administered medications for this encounter.    albuterol (VENTOLIN HFA) 108 (90 Base) MCG/ACT inhaler   aspirin EC 81 MG tablet   BREO ELLIPTA 100-25 MCG/INH AEPB    budesonide (PULMICORT) 0.5 MG/2ML nebulizer solution   diazepam (VALIUM) 2 MG tablet   DULoxetine (CYMBALTA) 30 MG capsule   ergocalciferol (VITAMIN D2) 1.25 MG (50000 UT) capsule   gabapentin (NEURONTIN) 400 MG capsule   HYDROcodone-acetaminophen (NORCO/VICODIN) 5-325 MG tablet   lamoTRIgine (LAMICTAL) 100 MG tablet   lansoprazole (PREVACID) 30 MG capsule   LORazepam (ATIVAN) 0.5 MG tablet   memantine (NAMENDA XR) 28 MG CP24 24 hr capsule   metoprolol succinate (TOPROL-XL) 50 MG 24 hr tablet   montelukast (SINGULAIR) 10 MG tablet   naloxone (NARCAN) nasal spray 4 mg/0.1 mL   nitroGLYCERIN (NITROLINGUAL) 0.4 MG/SPRAY spray   nystatin-triamcinolone (MYCOLOG II) cream   olmesartan (BENICAR) 40 MG tablet   Red Yeast Rice 600 MG CAPS   REPATHA SURECLICK 462 MG/ML SOAJ   Semaglutide,0.25 or 0.5MG /DOS, (OZEMPIC, 0.25 OR 0.5 MG/DOSE,) 2 MG/1.5ML SOPN   torsemide (DEMADEX) 20 MG tablet   traZODone (DESYREL) 100 MG tablet   valACYclovir (VALTREX) 1000 MG tablet   cyanocobalamin (,VITAMIN B-12,) 1000 MCG/ML injection   Magnesium 500 MG CAPS   History:   Past Medical History:  Diagnosis Date   (HFpEF) heart failure with preserved ejection fraction (HCC) 02/27/2014   a.) TTE 02/27/14: EF >55%; mod LVH; mild panvalvular regurgitation; (+) global stress induced HK. b.) TTE 09/20/14: EF 55%; triv AR/MR, mild TR/PR; mild AS; BAE; c.) TTE 06/20/15: EF 55%, triv AR/MR; BAE; mild AS. d.) TTE 02/11/17: EF 55; significant  RV dilation with mod systolic dysfunction; mild AS.  e.) TTE 02/27/19: EF 55%; mild AS. f.) TTE 06/04/20: EF 50%; mild AS   Anxiety    Aortic stenosis 09/20/2014   a.) TTE 09/20/2014: mild; MPG?. b.) TTE 06/20/2015: mild; MPG 8.0 mmHg. c.) TTE 02/11/2017: mild; MPG 12.4 mmHg. d.) TTE 02/27/2019: mild; MPG 11.4 mmHg. e.) TTE 06/04/2020: mild; MPG 9.6 mmHg.   Arthritis    Asthma    CAD (coronary artery disease)    Cataracts, bilateral    Chronic back pain    Chronic venous  insufficiency    Complication of anesthesia    a.) postoperative amnesia (1999). b.) seizures after partial knee in 2014; sent to [facility] x 5 days d/t uncontrollable shaking and AMS; ?? fentanyl induced tremors. c.) anesthesia awareness   COVID-19 05/2018   CVA (cerebral vascular accident) [facility])    Depressed    Essential tremor    GERD (gastroesophageal reflux disease)    Hepatitis B    a.) h/o in the 90s; reports not active at present   History of bronchitis    Jan-Mar 2017   History of hiatal hernia    Hyperlipidemia    Hypertension    Insomnia    Joint pain    Lymphedema    Mild cognitive impairment    Muscle spasm    in neck.Takes Flexeril as needed   Numbness    right knee,right hand,and toes on left   RBBB    Seizures (HCC)    [place]. takes Lamotrigine daily. Last seizure 6+ yrs ago   Sleep apnea with use of continuous positive airway pressure (CPAP)    VTE (venous thromboembolism)    a.) s/p RIGHT TKA   Past Surgical History:  Procedure Laterality Date   ANKLE ARTHROSCOPY Left 03/07/2020   Procedure: ANKLE ARTHROSCOPY;  Surgeon: Caroline More, DPM;  Location: [place];  Service: Podiatry;  Laterality: Left;   BACK SURGERY     x3   BLEPHAROPLASTY     CARDIAC CATHETERIZATION     2015   CARPAL TUNNEL RELEASE     COLONOSCOPY     CORONARY ANGIOPLASTY WITH STENT PLACEMENT     x 3   CORONARY ANGIOPLASTY WITH STENT PLACEMENT Left 01/30/2003   Procedure: CORONARY ANGIOPLASTY WITH STENT PLACEMENT (2.75 x 32 mm Taxus stent to RCA); Location: [facility]; Surgeon: Courtney Paris, MD   dental implants     ESOPHAGOGASTRODUODENOSCOPY     EYE SURGERY     both eyes in 60's d/t muscle causing to squint   JOINT REPLACEMENT Right    knee   KNEE ARTHROSCOPY     LEFT HEART CATH AND CORONARY ANGIOGRAPHY Left 04/12/2019   Procedure: LEFT HEART CATH AND CORONARY ANGIOGRAPHY;  Surgeon: Corey Skains, MD;  Location: [place] [facility];  Service: Cardiovascular;   Laterality: Left;   MEDIAL PARTIAL KNEE REPLACEMENT Right    MEDIASTERNOTOMY N/A 10/28/2015   Procedure: PARTIAL STERNOTOMY;  Surgeon: [name], MD;  Location: [place];  Service: Thoracic;  Laterality: N/A;   numerous hand surgery     ORIF ANKLE FRACTURE Left 03/07/2020   Procedure: OPEN REDUCTION INTERNAL FIXATION (ORIF) ANKLE FRACTURE;  Surgeon: Caroline More, DPM;  Location: [place];  Service: Podiatry;  Laterality: Left;   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   Procedure: RESECTION OF anterier MEDIASTINAL TUMOR;  Surgeon: [name], MD;  Location: [place];  Service: Thoracic;  Laterality: N/A;   right hand fusion     rotorblator  03/1997   anteriograde amnesia resulted   TENDON REPAIR Left 03/07/2020   Procedure: FLEXOR TENDON REPAIR;  Surgeon: Caroline More, DPM;  Location: [place];  Service: Podiatry;  Laterality: Left;  sleep apnea   TONSILLECTOMY     UMBILICAL HERNIA REPAIR     Family History  Problem Relation Age of Onset   Cancer Father 80       Bone    COPD Father    Heart disease Mother    Hyperlipidemia Mother    Stroke Mother    Heart disease Maternal Grandfather    Hyperlipidemia Maternal Grandfather    Social History   Tobacco Use   Smoking status: Never   Smokeless tobacco: Never  Vaping Use   Vaping Use: Never used  Substance Use Topics   Alcohol use: Yes    Alcohol/week: 14.0 standard drinks    Types: 14 Shots of liquor per week    Comment: wine and couple shots of liquor each evening   Drug use: No    Pertinent Clinical Results:  LABS: Labs reviewed: Acceptable for surgery.  Hospital Outpatient Visit on 02/26/2021  Component Date Value Ref Range Status   CRP 02/26/2021 1.3 (H)  <1.0 mg/dL Final   Performed at [facility], [address] [place], [place] [address]   Sed Rate 02/26/2021 5  0 - 20 mm/hr Final   Performed at [facility], [address] [place], [place] [address]    MRSA, PCR 02/26/2021 NEGATIVE  NEGATIVE Final   Staphylococcus aureus 02/26/2021 POSITIVE (A)  NEGATIVE Final   Comment: (NOTE) The Xpert SA Assay (FDA approved for NASAL specimens in patients 42 years of age and older), is one component of a comprehensive surveillance program. It is not intended to diagnose infection nor to guide or monitor treatment. Performed at [facility], [address] [place], [place] [address]    WBC 02/26/2021 8.3  4.0 - 10.5 K/uL Final   RBC 02/26/2021 4.47  4.22 - 5.81 MIL/uL Final   Hemoglobin 02/26/2021 15.2  13.0 - 17.0 g/dL Final   HCT 02/26/2021 41.8  39.0 - 52.0 % Final   MCV 02/26/2021 93.5  80.0 - 100.0 fL Final   MCH 02/26/2021 34.0  26.0 - 34.0 pg Final   MCHC 02/26/2021 36.4 (H)  30.0 - 36.0 g/dL Final   RDW 02/26/2021 12.4  11.5 - 15.5 % Final   Platelets 02/26/2021 178  150 - 400 K/uL Final   nRBC 02/26/2021 0.0  0.0 - 0.2 % Final   Performed at [facility], [address] [place], [place] [address]   Sodium 02/26/2021 126 (L)  135 - 145 mmol/L Final   Potassium 02/26/2021 4.5  3.5 - 5.1 mmol/L Final   Chloride 02/26/2021 92 (L)  98 - 111 mmol/L Final   CO2 02/26/2021 25  22 - 32 mmol/L Final   Glucose, Bld 02/26/2021 130 (H)  70 - 99 mg/dL Final   Glucose reference range applies only to samples taken after fasting for at least 8 hours.   BUN 02/26/2021 27 (H)  8 - 23 mg/dL Final   Creatinine, Ser 02/26/2021 1.08  0.61 - 1.24 mg/dL Final   Calcium 02/26/2021 9.4  8.9 - 10.3 mg/dL Final   Total Protein 02/26/2021 7.0  6.5 - 8.1 g/dL Final   Albumin 02/26/2021 4.1  3.5 - 5.0 g/dL Final   AST 02/26/2021 20  15 - 41 U/L Final  ALT 02/26/2021 35  0 - 44 U/L Final   Alkaline Phosphatase 02/26/2021 49  38 - 126 U/L Final   Total Bilirubin 02/26/2021 0.9  0.3 - 1.2 mg/dL Final   GFR, Estimated 02/26/2021 >60  >60 mL/min Final   Comment: (NOTE) Calculated using the CKD-EPI Creatinine Equation (2021)    Anion gap  02/26/2021 9  5 - 15 Final   Performed at [facility], [address] [place], [place] [address]   Prothrombin Time 02/26/2021 12.9  11.4 - 15.2 seconds Final   INR 02/26/2021 1.0  0.8 - 1.2 Final   Comment: (NOTE) INR goal varies based on device and disease states. Performed at [facility], [address] [place], [place] [address]    aPTT 02/26/2021 20 (L)  24 - 36 seconds Final   Performed at [facility], [place], [place] [address]   Color, Urine 02/26/2021 YELLOW (A)  YELLOW Final   APPearance 02/26/2021 CLEAR (A)  CLEAR Final   Specific Gravity, Urine 02/26/2021 1.009  1.005 - 1.030 Final   pH 02/26/2021 6.0  5.0 - 8.0 Final   Glucose, UA 02/26/2021 NEGATIVE  NEGATIVE mg/dL Final   Hgb urine dipstick 02/26/2021 NEGATIVE  NEGATIVE Final   Bilirubin Urine 02/26/2021 NEGATIVE  NEGATIVE Final   Ketones, ur 02/26/2021 NEGATIVE  NEGATIVE mg/dL Final   Protein, ur 02/26/2021 NEGATIVE  NEGATIVE mg/dL Final   Nitrite 02/26/2021 NEGATIVE  NEGATIVE Final   Leukocytes,Ua 02/26/2021 NEGATIVE  NEGATIVE Final   Performed at [facility], [address] [place], [place] [address]   ABO/RH(D) 02/26/2021 A NEG   Final   Antibody Screen 02/26/2021 NEG   Final   Sample Expiration 02/26/2021 03/12/2021,2359   Final   Extend sample reason 02/26/2021    Final                   Value:NO TRANSFUSIONS OR PREGNANCY IN THE PAST 3 MONTHS Performed at [facility], [address] [place], [place] [address]     ECG: Date: 02/26/2021 Time ECG obtained: 1259 PM Rate: 56 bpm Rhythm:  Sinus bradycardia; RBBB; LAFB Axis (leads I and aVF): Left Intervals: PR 162 ms. QRS 160 ms. QTc 411 ms. ST segment and T wave changes: No evidence of acute ST segment elevation or depression.  Evidence of an age undetermined anterior infarct present. Comparison: Similar to previous tracing obtained on 06/10/2020.  LAFB now present.   IMAGING /  PROCEDURES: DIAGNOSTIC RADIOGRAPHS OF RIGHT KNEE 3 views performed on 01/10/2021 Total knee implants present Radiolucencies noted at the bone cement interfaces along the posterior lateral aspects of the tibial tray There is anterior slope of the tibial component Relative varus alignment is noted on the AP view No evidence of fractures or dislocations  LEFT HEART CATHETERIZATION AND CORONARY ANGIOGRAPHY performed on 04/12/2019 LVEF 60% Multivessel CAD 45% stenosis of the mid LCx 40% stenosis of the proximal LAD 45% stenosis of the mid LAD 20% stenosis of the distal RCA 20% stenosis of the ostial to mid RCA 30% stenosis of the mid RCA Recommendations: Defer intervention at this time. Continue medical management for anginal symptoms and/or shortness of breath.   MYOCARDIAL PERFUSION IMAGING STUDY (LEXISCAN) performed on 09/13/2015 LVEF 52% Normal myocardial thickening and wall motion Artifacts were noted Left ventricular cavity size normal No evidence of stress-induced myocardial ischemia or arrhythmia  TRANSTHORACIC ECHOCARDIOGRAM performed on 09/13/2015 LVEF 55% Left ventricular systolic function Right ventricular systolic  function Mild to moderate tricuspid and mitral valve insufficiency Trace aortic valve insufficiency Mild right ventricular enlargement Mild biatrial enlargement Sclerotic aortic valve without significant stenosis  Impression and Plan:  Johnny Mccoy has been referred for pre-anesthesia review and clearance prior to him undergoing the planned anesthetic and procedural courses. Available labs, pertinent testing, and imaging results were personally reviewed by me. This patient has been appropriately cleared by cardiology with an overall LOW risk of significant perioperative cardiovascular complications.  Based on clinical review performed today (03/04/21), barring any significant acute changes in the patient's overall condition, it is anticipated that he will  be able to proceed with the planned surgical intervention. Any acute changes in clinical condition may necessitate his procedure being postponed and/or cancelled. Patient will meet with anesthesia team (MD and/or CRNA) on the day of his procedure for preoperative evaluation/assessment. Questions regarding anesthetic course will be fielded at that time.   Pre-surgical instructions were reviewed with the patient during his PAT appointment and questions were fielded by PAT clinical staff. Patient was advised that if any questions or concerns arise prior to his procedure then he should return a call to PAT and/or his surgeon's office to discuss.  Honor Loh, MSN, APRN, FNP-C, CEN [facility]  Peri-operative Services Nurse Practitioner Phone: [phone] Fax: [phone] 03/04/21 9:02 AM  NOTE: This note has been prepared using Dragon dictation software. Despite my best ability to proofread, there is always the potential that unintentional transcriptional errors may still occur from this process.

## 2021-03-03 ENCOUNTER — Other Ambulatory Visit (INDEPENDENT_AMBULATORY_CARE_PROVIDER_SITE_OTHER): Payer: Self-pay | Admitting: Nurse Practitioner

## 2021-03-03 DIAGNOSIS — Z86711 Personal history of pulmonary embolism: Secondary | ICD-10-CM

## 2021-03-04 ENCOUNTER — Encounter: Payer: Self-pay | Admitting: Vascular Surgery

## 2021-03-04 ENCOUNTER — Ambulatory Visit
Admission: RE | Admit: 2021-03-04 | Discharge: 2021-03-04 | Disposition: A | Discharge status: Home / Self Care | Payer: Medicare Other | Source: Ambulatory Visit | Attending: Vascular Surgery | Admitting: Vascular Surgery

## 2021-03-04 ENCOUNTER — Encounter: Payer: Self-pay | Admitting: Orthopedic Surgery

## 2021-03-04 ENCOUNTER — Other Ambulatory Visit: Payer: Self-pay

## 2021-03-04 ENCOUNTER — Encounter
Admission: RE | Disposition: A | Discharge status: Home / Self Care | Payer: Self-pay | Source: Ambulatory Visit | Attending: Vascular Surgery

## 2021-03-04 DIAGNOSIS — M1711 Unilateral primary osteoarthritis, right knee: Secondary | ICD-10-CM | POA: Insufficient documentation

## 2021-03-04 DIAGNOSIS — Z86711 Personal history of pulmonary embolism: Secondary | ICD-10-CM | POA: Insufficient documentation

## 2021-03-04 DIAGNOSIS — Z86718 Personal history of other venous thrombosis and embolism: Secondary | ICD-10-CM | POA: Diagnosis not present

## 2021-03-04 DIAGNOSIS — I82409 Acute embolism and thrombosis of unspecified deep veins of unspecified lower extremity: Secondary | ICD-10-CM

## 2021-03-04 HISTORY — PX: IVC FILTER INSERTION: CATH118245

## 2021-03-04 SURGERY — IVC FILTER INSERTION
Anesthesia: Moderate Sedation

## 2021-03-04 MED ORDER — SODIUM CHLORIDE 0.9 % IV SOLN: INTRAVENOUS | Status: DC

## 2021-03-04 MED ORDER — CEFAZOLIN SODIUM-DEXTROSE 2-4 GM/100ML-% IV SOLN: 2.0000 g | Freq: Once | INTRAVENOUS | Status: DC

## 2021-03-04 MED ORDER — CEFAZOLIN SODIUM-DEXTROSE 2-4 GM/100ML-% IV SOLN
INTRAVENOUS | Status: AC
  Filled 2021-03-04: qty 100

## 2021-03-04 MED ORDER — MIDAZOLAM HCL 2 MG/ML PO SYRP: 8.0000 mg | ORAL_SOLUTION | Freq: Once | ORAL | Status: DC | PRN

## 2021-03-04 MED ORDER — FENTANYL CITRATE PF 50 MCG/ML IJ SOSY
PREFILLED_SYRINGE | INTRAMUSCULAR | Status: AC
  Filled 2021-03-04: qty 1

## 2021-03-04 MED ORDER — DIPHENHYDRAMINE HCL 50 MG/ML IJ SOLN: 50.0000 mg | Freq: Once | INTRAMUSCULAR | Status: DC | PRN

## 2021-03-04 MED ORDER — MIDAZOLAM HCL 5 MG/5ML IJ SOLN
INTRAMUSCULAR | Status: AC
  Filled 2021-03-04: qty 5

## 2021-03-04 MED ORDER — FENTANYL CITRATE (PF) 100 MCG/2ML IJ SOLN
INTRAMUSCULAR | Status: DC | PRN
  Administered 2021-03-04: 50 ug via INTRAVENOUS

## 2021-03-04 MED ORDER — HEPARIN SODIUM (PORCINE) 1000 UNIT/ML IJ SOLN
INTRAMUSCULAR | Status: AC
  Filled 2021-03-04: qty 10

## 2021-03-04 MED ORDER — IODIXANOL 320 MG/ML IV SOLN
INTRAVENOUS | Status: DC | PRN
  Administered 2021-03-04: 30 mL

## 2021-03-04 MED ORDER — METHYLPREDNISOLONE SODIUM SUCC 125 MG IJ SOLR: 125.0000 mg | Freq: Once | INTRAMUSCULAR | Status: DC | PRN

## 2021-03-04 MED ORDER — ONDANSETRON HCL 4 MG/2ML IJ SOLN: 4.0000 mg | Freq: Four times a day (QID) | INTRAMUSCULAR | Status: DC | PRN

## 2021-03-04 MED ORDER — MIDAZOLAM HCL 2 MG/2ML IJ SOLN
INTRAMUSCULAR | Status: DC | PRN
  Administered 2021-03-04: 2 mg via INTRAVENOUS
  Administered 2021-03-04 (×2): 1 mg via INTRAVENOUS

## 2021-03-04 MED ORDER — FAMOTIDINE 20 MG PO TABS: 40.0000 mg | ORAL_TABLET | Freq: Once | ORAL | Status: DC | PRN

## 2021-03-04 MED ORDER — HYDROMORPHONE HCL 1 MG/ML IJ SOLN: 1.0000 mg | Freq: Once | INTRAMUSCULAR | Status: DC | PRN

## 2021-03-04 MED ORDER — CEFAZOLIN SODIUM-DEXTROSE 1-4 GM/50ML-% IV SOLN
INTRAVENOUS | Status: DC | PRN
  Administered 2021-03-04: 2 g via INTRAVENOUS

## 2021-03-04 SURGICAL SUPPLY — 5 items
CANNULA 5F STIFF (CANNULA) ×1 IMPLANT
COVER PROBE U/S 5X48 (MISCELLANEOUS) ×1 IMPLANT
KIT FEM OPTION ELITE FILTER (Filter) ×1 IMPLANT
PACK ANGIOGRAPHY (CUSTOM PROCEDURE TRAY) ×2 IMPLANT
WIRE GUIDERIGHT .035X150 (WIRE) ×1 IMPLANT

## 2021-03-04 NOTE — H&P (Signed)
@LOGO @   MRN : [id number]  Johnny Mccoy is a 76 y.o. (March 18, 1945) male who presents with chief complaint of here to have filter put in.  History of Present Illness:   Patient presents to [facility] today for placement of an IVC filter.  He has a history of pulmonary embolism and is now scheduled for a right total knee replacement on the ninth of this month.  Given these findings he is undergoing IVC filter placement prior to his high risk surgery.  Current Meds  Medication Sig   albuterol (VENTOLIN HFA) 108 (90 Base) MCG/ACT inhaler Inhale 2 puffs into the lungs every 4 (four) hours as needed for wheezing or shortness of breath.   aspirin EC 81 MG tablet Take 81 mg by mouth daily. Swallow whole.   BREO ELLIPTA 100-25 MCG/INH AEPB Inhale 1 puff into the lungs daily.    DULoxetine (CYMBALTA) 30 MG capsule Take 30 mg by mouth daily.   ergocalciferol (VITAMIN D2) 1.25 MG (50000 UT) capsule Take 50,000 Units by mouth every Tuesday.   gabapentin (NEURONTIN) 400 MG capsule Take 1 capsule (400 mg total) by mouth at bedtime.   HYDROcodone-acetaminophen (NORCO/VICODIN) 5-325 MG tablet Take 1 tablet by mouth 2 (two) times daily as needed for moderate pain.   lamoTRIgine (LAMICTAL) 100 MG tablet Take 100 mg by mouth at bedtime.   lansoprazole (PREVACID) 30 MG capsule Take 30 mg by mouth in the morning and at bedtime.   LORazepam (ATIVAN) 0.5 MG tablet Take 0.5 mg by mouth 2 (two) times daily.    Magnesium 500 MG CAPS Take 1 capsule by mouth daily in the afternoon.   memantine (NAMENDA XR) 28 MG CP24 24 hr capsule Take 1 capsule by mouth daily.   metoprolol succinate (TOPROL-XL) 50 MG 24 hr tablet Take 50 mg by mouth 2 (two) times daily.    montelukast (SINGULAIR) 10 MG tablet Take 10 mg by mouth at bedtime.   nystatin-triamcinolone (MYCOLOG II) cream Apply 1 application topically 2 (two) times daily.   olmesartan (BENICAR) 40 MG tablet Take 40 mg by mouth daily.   REPATHA SURECLICK 383 MG/ML  SOAJ Inject 140 mg into the skin every 14 (fourteen) days.   Semaglutide,0.25 or 0.5MG /DOS, (OZEMPIC, 0.25 OR 0.5 MG/DOSE,) 2 MG/1.5ML SOPN Inject 0.5 mg into the skin every Tuesday.   torsemide (DEMADEX) 20 MG tablet Take 10 mg by mouth daily.   traZODone (DESYREL) 100 MG tablet Take 100 mg by mouth at bedtime.    Past Medical History:  Diagnosis Date   (HFpEF) heart failure with preserved ejection fraction ([place]) 02/27/2014   a.) TTE 02/27/14: EF >55%; mod LVH; mild panvalvular regurgitation; (+) global stress induced HK. b.) TTE 09/20/14: EF 55%; triv AR/MR, mild TR/PR; mild AS; BAE; c.) TTE 06/20/15: EF 55%, triv AR/MR; BAE; mild AS. d.) TTE 02/11/17: EF 55; significant  RV dilation with mod systolic dysfunction; mild AS. e.) TTE 02/27/19: EF 55%; mild AS. f.) TTE 06/04/20: EF 50%; mild AS   Anxiety    Aortic stenosis 09/20/2014   a.) TTE 09/20/2014: mild; MPG?. b.) TTE 06/20/2015: mild; MPG 8.0 mmHg. c.) TTE 02/11/2017: mild; MPG 12.4 mmHg. d.) TTE 02/27/2019: mild; MPG 11.4 mmHg. e.) TTE 06/04/2020: mild; MPG 9.6 mmHg.   Arthritis    Asthma    CAD (coronary artery disease)    Cataracts, bilateral    Chronic back pain    Chronic venous insufficiency    Complication of anesthesia    a.)  postoperative amnesia (1999). b.) seizures after partial knee in 2014; sent to [facility] x 5 days d/t uncontrollable shaking and AMS; ?? fentanyl induced tremors. c.) anesthesia awareness   COVID-19 05/2018   CVA (cerebral vascular accident) [facility])    Depressed    Essential tremor    GERD (gastroesophageal reflux disease)    Hepatitis B    a.) h/o in the 90s; reports not active at present   History of bronchitis    Jan-Mar 2017   History of hiatal hernia    Hyperlipidemia    Hypertension    Insomnia    Joint pain    Lymphedema    Mild cognitive impairment    Muscle spasm    in neck.Takes Flexeril as needed   Numbness    right knee,right hand,and toes on left   RBBB    Seizures (HCC)    [place]. takes Lamotrigine daily. Last seizure 6+ yrs ago   Sleep apnea with use of continuous positive airway pressure (CPAP)    VTE (venous thromboembolism)    a.) s/p RIGHT TKA    Past Surgical History:  Procedure Laterality Date   ANKLE ARTHROSCOPY Left 03/07/2020   Procedure: ANKLE ARTHROSCOPY;  Surgeon: Caroline More, DPM;  Location: [place];  Service: Podiatry;  Laterality: Left;   BACK SURGERY     x3   BLEPHAROPLASTY     CARDIAC CATHETERIZATION     2015   CARPAL TUNNEL RELEASE     COLONOSCOPY     CORONARY ANGIOPLASTY WITH STENT PLACEMENT     x 3   CORONARY ANGIOPLASTY WITH STENT PLACEMENT Left 01/30/2003   Procedure: CORONARY ANGIOPLASTY WITH STENT PLACEMENT (2.75 x 32 mm Taxus stent to RCA); Location: [facility]; Surgeon: Courtney Paris, MD   dental implants     ESOPHAGOGASTRODUODENOSCOPY     EYE SURGERY     both eyes in 60's d/t muscle causing to squint   JOINT REPLACEMENT Right    knee   KNEE ARTHROSCOPY     LEFT HEART CATH AND CORONARY ANGIOGRAPHY Left 04/12/2019   Procedure: LEFT HEART CATH AND CORONARY ANGIOGRAPHY;  Surgeon: Corey Skains, MD;  Location: [place] [facility];  Service: Cardiovascular;  Laterality: Left;   MEDIAL PARTIAL KNEE REPLACEMENT Right    MEDIASTERNOTOMY N/A 10/28/2015   Procedure: PARTIAL STERNOTOMY;  Surgeon: [name], MD;  Location: [place];  Service: Thoracic;  Laterality: N/A;   numerous hand surgery     ORIF ANKLE FRACTURE Left 03/07/2020   Procedure: OPEN REDUCTION INTERNAL FIXATION (ORIF) ANKLE FRACTURE;  Surgeon: Caroline More, DPM;  Location: [place];  Service: Podiatry;  Laterality: Left;   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   Procedure: RESECTION OF anterier MEDIASTINAL TUMOR;  Surgeon: [name], MD;  Location: [place];  Service: Thoracic;  Laterality: N/A;   right hand fusion     rotorblator  03/1997   anteriograde amnesia resulted   TENDON REPAIR Left 03/07/2020   Procedure:  FLEXOR TENDON REPAIR;  Surgeon: Caroline More, DPM;  Location: [place];  Service: Podiatry;  Laterality: Left;  sleep apnea   TONSILLECTOMY     UMBILICAL HERNIA REPAIR      Social History Social History   Tobacco Use   Smoking status: Never   Smokeless tobacco: Never  Vaping Use   Vaping Use: Never used  Substance Use Topics   Alcohol use: Yes    Alcohol/week: 14.0 standard drinks  Types: 14 Shots of liquor per week    Comment: wine and couple shots of liquor each evening   Drug use: No    Family History Family History  Problem Relation Age of Onset   Cancer Father 21       Bone    COPD Father    Heart disease Mother    Hyperlipidemia Mother    Stroke Mother    Heart disease Maternal Grandfather    Hyperlipidemia Maternal Grandfather     Allergies  Allergen Reactions   Fentanyl Other (See Comments)    Possible involuntary tremors   Methocarbamol Other (See Comments)    confusion     REVIEW OF SYSTEMS (Negative unless checked)  Constitutional: [] Weight loss  [] Fever  [] Chills Cardiac: [] Chest pain   [] Chest pressure   [] Palpitations   [] Shortness of breath when laying flat   [] Shortness of breath with exertion. Vascular:  [] Pain in legs with walking   [] Pain in legs at rest  [x] History of DVT   [] Phlebitis   [x] Swelling in legs   [] Varicose veins   [] Non-healing ulcers Pulmonary:   [] Uses home oxygen   [] Productive cough   [] Hemoptysis   [] Wheeze  [] COPD   [] Asthma Neurologic:  [] Dizziness   [] Seizures   [] History of stroke   [] History of TIA  [] Aphasia   [] Vissual changes   [] Weakness or numbness in arm   [] Weakness or numbness in leg Musculoskeletal:   [] Joint swelling   [] Joint pain   [] Low back pain Hematologic:  [] Easy bruising  [] Easy bleeding   [] Hypercoagulable state   [] Anemic Gastrointestinal:  [] Diarrhea   [] Vomiting  [] Gastroesophageal reflux/heartburn   [] Difficulty swallowing. Genitourinary:  [] Chronic kidney disease   [] Difficult  urination  [] Frequent urination   [] Blood in urine Skin:  [] Rashes   [] Ulcers  Psychological:  [] History of anxiety   []  History of major depression.  Physical Examination  Vitals:   03/04/21 1304  BP: (!) 143/67  Pulse: (!) 56  Resp: 17  Temp: 97.8 F (36.6 C)  TempSrc: Oral  SpO2: 96%  Weight: 104.3 kg  Height: 5\' 7"  (1.702 m)   Body mass index is 36.02 kg/m. Gen: WD/WN, NAD Head: [place]/AT, No temporalis wasting.  Ear/Nose/Throat: Hearing grossly intact, nares w/o erythema or drainage, pinna without lesions Eyes: PER, EOMI, sclera nonicteric.  Neck: Supple, no gross masses.  No JVD.  Pulmonary:  Good air movement, no audible wheezing, no use of accessory muscles.  Cardiac: RRR, precordium not hyperdynamic. Vascular:  scattered varicosities present bilaterally.  Mild venous stasis changes to the legs bilaterally.  2+ soft pitting edema  Vessel Right Left  Radial Palpable Palpable  Gastrointestinal: soft, non-distended. No guarding/no peritoneal signs.  Musculoskeletal: M/S 5/5 throughout.  No deformity.  Neurologic: CN 2-12 intact. Pain and light touch intact in extremities.  Symmetrical.  Speech is fluent. Motor exam as listed above. Psychiatric: Judgment intact, Mood & affect appropriate for pt's clinical situation. Dermatologic: Venous rashes no ulcers noted.  No changes consistent with cellulitis. Lymph : No lichenification or skin changes of chronic lymphedema.  CBC Lab Results  Component Value Date   WBC 8.3 02/26/2021   HGB 15.2 02/26/2021   HCT 41.8 02/26/2021   MCV 93.5 02/26/2021   PLT 178 02/26/2021    BMET    Component Value Date/Time   NA 126 (L) 02/26/2021 1253   NA 140 03/02/2014 0420   K 4.5 02/26/2021 1253   K 4.1 03/02/2014 0420  CL 92 (L) 02/26/2021 1253   CL 105 03/02/2014 0420   CO2 25 02/26/2021 1253   CO2 30 03/02/2014 0420   GLUCOSE 130 (H) 02/26/2021 1253   GLUCOSE 105 (H) 03/02/2014 0420   BUN 27 (H) 02/26/2021 1253   BUN 11  03/02/2014 0420   CREATININE 1.08 02/26/2021 1253   CREATININE 0.92 03/02/2014 0420   CALCIUM 9.4 02/26/2021 1253   CALCIUM 7.9 (L) 03/02/2014 0420   GFRNONAA >60 02/26/2021 1253   GFRNONAA >60 03/02/2014 0420   GFRNONAA >60 04/10/2013 1711   GFRAA >60 10/30/2015 1703   GFRAA >60 03/02/2014 0420   GFRAA >60 04/10/2013 1711   Estimated Creatinine Clearance: 69.1 mL/min (by C-G formula based on SCr of 1.08 mg/dL).  COAG Lab Results  Component Value Date   INR 1.0 02/26/2021   INR 0.99 10/23/2015   INR 0.9 03/22/2013    Radiology No results found.   Assessment/Plan History of PE: IVC filter is indicated prior to his revision and right total knee replacement.  Risks and benefits of been discussed with the patient all questions been answered patient has agreed to proceed and we are placing the filter prior to his orthopedic procedure.   Hortencia Pilar, MD  03/04/2021 1:58 PM

## 2021-03-04 NOTE — Interval H&P Note (Signed)
History and Physical Interval Note:  03/04/2021 2:03 PM  Johnny Mccoy  has presented today for surgery, with the diagnosis of IVC Filter Placement   DVT.  The various methods of treatment have been discussed with the patient and family. After consideration of risks, benefits and other options for treatment, the patient has consented to  Procedure(s): IVC FILTER INSERTION (N/A) as a surgical intervention.  The patient's history has been reviewed, patient examined, no change in status, stable for surgery.  I have reviewed the patient's chart and labs.  Questions were answered to the patient's satisfaction.     Hortencia Pilar

## 2021-03-04 NOTE — Op Note (Signed)
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   Ultrasound guidance for vascular access to the right femoral vein 2.   Catheter placement into the inferior vena cava 3.   Inferior venacavogram 4.   Placement of a option Elite IVC filter  SURGEON: Hortencia Pilar  ASSISTANT(S): None  ANESTHESIA: Conscious sedation was administered by the interventional radiology RN under my direct supervision. IV Versed plus fentanyl were utilized. Continuous ECG, pulse oximetry and blood pressure was monitored throughout the entire procedure. Conscious sedation was for a total of 25 minutes and 10 seconds.  ESTIMATED BLOOD LOSS: minimal  FINDING(S): 1.  Patent IVC  SPECIMEN(S):  none  INDICATIONS:   [name] is a 76 y.o. y.o. male who presents with upcoming revision of his right total knee.  He has a history of PE and DVT and given his upcoming Mccoy inferior vena cava filter is indicated for this reason.  Risks and benefits including filter thrombosis, migration, fracture, bleeding, and infection were all discussed.  We discussed that all IVC filters that we place can be removed if desired from the patient once the need for the filter has passed.    DESCRIPTION: After obtaining full informed written consent, the patient was brought back to the vascular suite. The skin was sterilely prepped and draped in a sterile surgical field was created. Ultrasound was placed in a sterile sleeve. The right common femoral vein was echolucent and compressible indicating patency. Image was recorded for the permanent record. The puncture was made under continuous real-time ultrasound guidance.  The right common femoral vein was accessed under direct ultrasound guidance without difficulty with a micropuncture needle. Microwire was  then advanced under fluoroscopic guidance without difficulty. Micro-sheath was then inserted and a J-wire was then placed. The dilator is passed over the wire and the delivery sheath was placed into the inferior vena cava.  Inferior venacavogram was performed. This demonstrated a patent IVC with the level of the renal veins at L1.  The filter was then deployed into the inferior vena cava at the level of L2 just below the renal veins. The delivery sheath was then removed. Pressure was held. Sterile dressings were placed. The patient tolerated the procedure well and was taken to the recovery room in stable condition.  Interpretation: IVC is widely patent.  Option Elite filter is deployed at the L2 level in good orientation  COMPLICATIONS: None  CONDITION: Stable  Hortencia Pilar  03/04/2021, 2:33 PM

## 2021-03-04 NOTE — Progress Notes (Signed)
Discussed with patient the allergy to Fentanyl and he states he had an episode of tremors during a surgical procedure in 2014 and was evaluated by multiple specialists and felt it was related to the Fentanyl.  Since that surgery, patient states has had 4 or 5 other surgeries and had no issues.  Upon review in Care Everywhere with [facility], surgery with the Neurosurgery team on 10/04/2019, the Anesthesia report lists patient receiving Fentanyl during the procedure with no documented issues or tremors.

## 2021-03-05 ENCOUNTER — Encounter: Payer: Self-pay | Admitting: Vascular Surgery

## 2021-03-06 ENCOUNTER — Other Ambulatory Visit
Admission: RE | Admit: 2021-03-06 | Discharge: 2021-03-06 | Disposition: A | Discharge status: Home / Self Care | Payer: Medicare Other | Source: Ambulatory Visit | Attending: Orthopedic Surgery | Admitting: Orthopedic Surgery

## 2021-03-06 ENCOUNTER — Other Ambulatory Visit: Payer: Self-pay

## 2021-03-06 DIAGNOSIS — Z01812 Encounter for preprocedural laboratory examination: Secondary | ICD-10-CM | POA: Diagnosis present

## 2021-03-06 DIAGNOSIS — Z20822 Contact with and (suspected) exposure to covid-19: Secondary | ICD-10-CM | POA: Insufficient documentation

## 2021-03-06 LAB — SARS CORONAVIRUS 2 (TAT 6-24 HRS): SARS Coronavirus 2: NEGATIVE

## 2021-03-09 NOTE — H&P (Signed)
ORTHOPAEDIC HISTORY & PHYSICAL Regino Bellow, PA - 03/05/2021 2:45 PM EST Formatting of this note is different from the original. Images from the original note were not included. Chief Complaint Chief Complaint  Patient presents with   Right Knee - Pain  History & Physical Right TKA Revision 03/10/21 [facility]   Reason for Visit Johnny Mccoy is a 76 y.o. who presents today for a history and physical. He is to undergo a right total knee arthroplasty revision on 03/10/2021. Last seen in the clinic on 01/31/2021. No increased discomfort noted. No change in his condition since that date.  He is almost 7 years status post conversion of a medial unicompartmental knee arthroplasty to a right total knee arthroplasty. He has had swelling to the knee and some increased pain with weightbearing activities. He had a fall in June 2021 but states that he did not directly strike the right knee. He is not using any ambulatory aids. He is not exercising on a regular basis. He does have bilateral lower extremity lymphedema and just recently began using lymphedema pumps. He denies any fevers, chills, erythema.  Past Medical History Past Medical History:  Diagnosis Date   Acute diastolic heart failure due to valvular disease (CMS-HCC) 06/06/2020   Acute respiratory failure (CMS-HCC) 06/2016   Amnesia  retro and anterograde perioperative amnesia s/p prior cardiac surgery   Anesthesia complication  0233 amnesia after cardiac procedure /severe shaking after orthopedic procedure in 2015   Anxiety   Arthritis   Asthma without status asthmaticus, unspecified   Awareness under anesthesia   CAD (coronary artery disease)  s/p DES mid RCA 2004, prior BMS to LCx. 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   Shingles   Sleep apnea  does not use CPAP- new diagnosis 06/2017   Spells  pt endorses lapses in time for which he was started on lamictal   Stroke (CMS-HCC)  no residual   Transient alteration of awareness   Past Surgical History Past Surgical History:  Procedure Laterality Date   UMBILICAL HERNIA REPAIR 4356   ENDOSCOPIC CARPAL TUNNEL RELEASE Right 2012   EYELID SURGERY 2012   ARTHROSCOPY KNEE Right 05/26/2012   PARTIAL KNEE REPLACEMENT Right 05/26/2012   RIGHT TOTAL KNEE REVISION Right 04/05/2013   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   AUTOGRAFT OBTAINED SAME INCISION FOR SPINE SURGERY N/A 09/23/2016  Procedure: AUTOGRAFT FOR SPINE SURGERY ONLY (INCL HARVESTING GRAFT); LOCAL (EG, RIBS, SPINOUS PROCESS, OR LAMINAR FRAGMENT) OBTAINED FROM SAME INCISION (LIST IN ADDITION TO PRIMARY PROCEDURE); Surgeon: Gara Kroner, MD; Location: [facility]; Service: Neurosurgery; Laterality: N/A;   INSERTION MORSELIZED BONE ALLOGRAFT FOR SPINE SURGERY N/A 09/23/2016  Procedure: ALLOGRAFT, MORSELIZED, OR PLACEMENT OF OSTEOPROMOTIVE MATERIAL, FOR SPINE SURGERY ONLY (LIST IN ADDITION TO PRIMARY PROCEDURE); Surgeon: Gara Kroner, MD; Location: [place]; Service: Neurosurgery; Laterality: N/A;   INSTRUMENTATION POSTERIOR SPINE 3 TO 6 VERTEBRAL SEGMENTS N/A 09/23/2016  Procedure: POSTERIOR SEGMENTAL INSTRUMENTATION (EG, PEDICLE FIXATION, DUAL RODS WITH MULTIPLE HOOKS  AND SUBLAMINAR WIRES); 3 TO 6 VERTEBRAL SEGMENTS (LIST IN ADDITION TO PRIMARY PROCEDURE); Surgeon: Gara Kroner, MD; Location: [place]; Service: Neurosurgery; Laterality: N/A;   ASPIRATION BONE MARROW N/A 09/23/2016  Procedure: DIAGNOSTIC BONE MARROW; ASPIRATION(S); Surgeon: Gara Kroner, MD; Location: [facility] OR; Service: Neurosurgery; Laterality: N/A;   ARTHRODESIS SACROILIAC JOINT Right 07/29/2017  Procedure: RIGHT SACROILIAC JOINT FUSION; Surgeon: Janice Coffin, MD; Location: [place]; Service: Neurosurgery; Laterality: Right;   EXTRACTION CATARACT EXTRACAPSULAR W/INSERTION INTRAOCULAR PROSTHESIS Left 08/17/2019  Procedure: L - EXTRACAPSULAR CATARACT PHACO REMOVAL WITH INSERTION OF INTRAOCULAR LENS PROSTHESIS (1 STAGE PROCEDURE), MANUAL OR MECHANICAL TECHNIQUE; WITHOUT ENDOSCOPIC CYCLOPHOTOCOAGULATION; Surgeon: Nelda Bucks, MD; Location: [facility] OR; Service: Ophthalmology; Laterality: Left;   OBLIQUE LATERAL INTERBODY FUSION LUMBAR N/A 10/04/2019  Procedure: L3-4 XLIF with plate; Surgeon: Gara Kroner, MD; Location: [place]; Service: Neurosurgery; Laterality: N/A;   INSERTION INTERBODY BIOMECHANICAL DEVICE W/ INSTRUMENTATION N/A 10/04/2019  Procedure: INSERTION INTERBODY BIOMECHANICAL DEVICE WITH INTEGRAL ANTERIOR INSTRUMENTATION, TO INTERVERTEBRAL DISC SPACE IN CONJUNCTION INTERBODY ARTHRODESIS, EACH INTERSPACE (LIST CODE FOR PRIMARY PROCEDURE); Surgeon: Gara Kroner, MD; Location: [facility] OR; Service: Neurosurgery; Laterality: N/A;   CARDIAC CATHETERIZATION  heart cath x10   COLONOSCOPY   LEFT WRIST SURGERY   RIGHT HAND SURGERY 2010 and 2011   right hand surgery   TONSILLECTOMY   trigger finger release  multiple fingers on right hand   Past Family History Family History  Problem Relation Age of Onset   Stroke Mother   Bone cancer Father   No Known Problems Sister   No Known Problems Brother   No Known Problems Maternal Aunt   No Known Problems Maternal Uncle   No Known Problems Paternal Aunt   No Known Problems Paternal Uncle   No Known Problems Maternal Grandmother   No Known Problems Maternal Grandfather   No Known Problems Paternal Grandmother   No Known Problems Paternal Grandfather   Coronary Artery Disease (Blocked arteries around heart) Neg Hx    Myocardial Infarction (Heart attack) Neg Hx   Sudden death (unexpected death due to unknown cause) Neg Hx   Anesthesia problems Neg Hx   Malignant hypertension Neg Hx   Malignant hyperthermia Neg Hx   Allergies Neg Hx   Asthma Neg Hx   Cancer Neg Hx   Clotting disorder Neg Hx   Diabetes Neg Hx   Hearing loss Neg Hx   Heart disease Neg Hx   Neurological disorder Neg Hx   Thyroid disease Neg Hx   Medications Current Outpatient Medications Ordered in Epic  Medication Sig Dispense Refill   albuterol 90 mcg/actuation inhaler Inhale 2 inhalations into the lungs every 4 (four) hours as needed for Wheezing DAW - VENTOLIN HFA QID PRN 8 g 2   budesonide (PULMICORT) 0.5 mg/2 mL nebulizer solution Take 2 mLs (0.5 mg total) by nebulization once daily (Patient taking differently: Take 0.5 mg by nebulization once daily as needed) 60 mL 0   diazePAM (VALIUM) 2 MG tablet 2-3 tablets po 30 minutes before injection 3 tablet 0   DULoxetine (CYMBALTA) 60 MG DR capsule Take 60 mg by mouth once daily   ergocalciferol, vitamin D2, 1,250 mcg (50,000 unit) capsule Take 1 capsule (50,000 Units total) by mouth once a week 12 capsule 3   evolocumab (REPATHA SURECLICK) 332 mg/mL PnIj Inject 1 mL subcutaneously every 14 (fourteen) days 1 mL 6   fluticasone furoate-vilanteroL (BREO ELLIPTA) 100-25 mcg/dose DsDv inhaler Inhale 1 inhalation into the lungs once daily  180 each 3   gabapentin (NEURONTIN) 400 MG capsule TAKE 1 CAPSULE BY MOUTH 3 TIMES DAILY. (Patient taking differently: Take 400 mg by mouth at bedtime) 90 capsule 3   HYDROcodone-acetaminophen (NORCO) 5-325 mg tablet 1/2-1 po bid prn 40 tablet 0   lamoTRIgine (LAMICTAL) 100 MG tablet Take 100 mg by mouth nightly.    lansoprazole (PREVACID) 30 MG DR capsule Take 1 capsule (30 mg total) by mouth 2 (two) times daily 180 capsule 3   LORazepam (ATIVAN) 0.5 MG tablet Take 0.5 mg by mouth every 12 (twelve) hours   magnesium oxide (MAG-OX) 400 mg (241.3 mg  magnesium) tablet Take 400 mg by mouth once daily   metoprolol succinate (TOPROL-XL) 50 MG XL tablet Take 1 tablet (50 mg total) by mouth 2 (two) times daily 180 tablet 3   montelukast (SINGULAIR) 10 mg tablet Take 1 tablet (10 mg total) by mouth at bedtime 30 tablet 11   naloxone (NARCAN) 4 mg/actuation nasal spray Place 1 spray (4 mg total) into one nostril once as needed (if not breathing.) for up to 1 dose 1 each 1   NAMENDA XR 28 mg CSpX Take 1 capsule by mouth once daily.    nitroGLYcerin (NITROLINGUAL) 400 mcg/spray spray Place 1 spray under the tongue every 5 (five) minutes as needed for Chest pain May take up to 3 doses. 12 g 2   nystatin-triamcinolone cream APPLY SPARINGLY TO AFFECTED AREAS THREE TIMES DAILY   olmesartan (BENICAR) 40 MG tablet TAKE 1 TABLET BY MOUTH EVERY DAY 90 tablet 3   semaglutide (OZEMPIC) 0.25 mg or 0.5 mg(2 mg/1.5 mL) pen injector Inject 0.375 mLs (0.5 mg total) subcutaneously once a week for 30 days 1.5 mL 3   TORsemide (DEMADEX) 20 MG tablet Take 0.5 tablets (10 mg total) by mouth once daily 45 tablet 3   traZODone (DESYREL) 100 MG tablet Take by mouth at bedtime   valACYclovir (VALTREX) 1000 MG tablet TAKE 1 TABLET BY MOUTH THREE TIMES A DAY 90 tablet 1   aspirin 81 MG EC tablet Take 81 mg by mouth once daily (Patient not taking: Reported on 03/05/2021)   desvenlafaxine succinate (PRISTIQ) 100 MG ER tablet Take 100 mg by mouth once daily.  (Patient not taking: Reported on 03/05/2021)   Current Facility-Administered Medications Ordered in Epic  Medication Dose Route Frequency Provider Last Rate Last Admin   cyanocobalamin (VITAMIN B12) injection 1,000 mcg 1,000 mcg Intramuscular Q30 Days Idelle Crouch, MD 1,000 mcg at 02/06/21 1331   Allergies Allergies  Allergen Reactions   Fentanyl Other (See Comments)  Involuntary tremors. Patient said this was looked into and it was not the cause of the tremors   Robaxin [Methocarbamol] Other (See Comments)   confusion    Review of Systems A comprehensive 14 point ROS was performed, reviewed, and the pertinent orthopaedic findings are documented in the HPI.  Exam BP 130/84 (BP Location: Left upper arm, Patient Position: Sitting, BP Cuff Size: Adult)   Ht 170.2 cm (5' 7")   Wt (!) 108.6 kg (239 lb 6.4 oz)   BMI 37.50 kg/m   General: Well-developed well-nourished male seen in no acute distress.   HEENT: Atraumatic,normocephalic. Pupils are equal and reactive to light. Oropharynx is clear with moist mucosa  Lungs: Clear to auscultation bilaterally   Cardiovascular: Regular rate and rhythm. Normal S1, S2. No murmurs. No appreciable gallops or rubs. Peripheral pulses are palpable.  Abdomen: Soft, non-tender, nondistended. Bowel sounds present  Extremity: Right  Knee:    Soft tissue swelling: moderate    Effusion:                   moderate    Erythema:                 none    Crepitance:               none   Tenderness:              medial    Alignment:                relative varus    Mediolateral laxity:   stable    Patellar tracking:      Good tracking without evidence of subluxation or tilt    Atrophy:                    No significant atrophy.  Quadriceps tone was fair to good.    Range of motion:     0/0/124 degrees  Neurological:  The patient is alert and oriented Sensation to light touch appears to be intact and within normal limits Gross motor strength appeared to be equal to 5/5  Vascular :  Peripheral pulses felt to be palpable. Capillary refill appears to be intact and within normal limits  X-ray  NUCLEAR MEDICINE 3-PHASE BONE SCAN   TECHNIQUE:  Radionuclide angiographic images, immediate static blood pool  images, and 3-hour delayed static images were obtained of the knees  after intravenous injection of radiopharmaceutical. CT imaging of  the knees was performed concurrently. Fused SPECT/CT data sets are  reviewed in 3 planes.   RADIOPHARMACEUTICALS:   22.08 mCi Tc-56mMDP IV   COMPARISON:  Concurrent CT   FINDINGS:  Vascular phase: Increased blood flow to periarticular regions of  RIGHT knee   Blood pool phase: Increased blood pool RIGHT knee at the proximal  tibia and medial compartment   Delayed phase: No abnormal tracer uptake LEFT knee. Marked increased  tracer uptake at medial tibial plateau and proximal tibial  metaphysis consistent with aseptic loosening or infection. No  abnormal tracer uptake seen adjacent to the femoral component of the  prosthesis.   Additional CT findings: Large RIGHT knee joint effusion. No definite  fractures.   IMPRESSION:  Abnormal blood flow, blood pool and delayed uptake of tracer in the  proximal tibia adjacent to the RIGHT knee prosthesis consistent with  either aseptic loosening or infection.   Large RIGHT knee joint effusion.   Electronically Signed    By: MLavonia DanaM.D.    On: 01/27/2021 16:40  Impression  1. Right total knee arthroplasty with implant loosening (tibial component)  Plan   1. Patient has discontinued his aspirin. 2. Return to clinic 2 weeks postop. Sooner if any problems 3. Patient did have his IVC filter inserted yesterday by Dr. SDelana Meyer4. Patient was seen by cardiology this morning. Note pending  This note was generated in part with voice recognition software and I apologize for any typographical errors that were not detected and corrected   JWatt ClimesPA Electronically signed by WRegino Bellow PA at 03/05/2021 3:14 PM EST

## 2021-03-10 ENCOUNTER — Inpatient Hospital Stay
Admission: RE | Admit: 2021-03-10 | Discharge: 2021-03-12 | DRG: 467 | Disposition: A | Discharge status: Home Health | Payer: Medicare Other | Attending: Orthopedic Surgery | Admitting: Orthopedic Surgery

## 2021-03-10 ENCOUNTER — Inpatient Hospital Stay: Payer: Medicare Other

## 2021-03-10 ENCOUNTER — Encounter: Payer: Self-pay | Admitting: Orthopedic Surgery

## 2021-03-10 ENCOUNTER — Encounter
Admission: RE | Disposition: A | Discharge status: Home Health | Payer: Self-pay | Source: Home / Self Care | Attending: Orthopedic Surgery

## 2021-03-10 ENCOUNTER — Inpatient Hospital Stay: Payer: Medicare Other | Admitting: Urgent Care

## 2021-03-10 ENCOUNTER — Other Ambulatory Visit: Payer: Self-pay

## 2021-03-10 DIAGNOSIS — K219 Gastro-esophageal reflux disease without esophagitis: Secondary | ICD-10-CM | POA: Diagnosis present

## 2021-03-10 DIAGNOSIS — E876 Hypokalemia: Secondary | ICD-10-CM

## 2021-03-10 DIAGNOSIS — Z955 Presence of coronary angioplasty implant and graft: Secondary | ICD-10-CM | POA: Diagnosis not present

## 2021-03-10 DIAGNOSIS — Z825 Family history of asthma and other chronic lower respiratory diseases: Secondary | ICD-10-CM | POA: Diagnosis not present

## 2021-03-10 DIAGNOSIS — T84032D Mechanical loosening of internal right knee prosthetic joint, subsequent encounter: Secondary | ICD-10-CM

## 2021-03-10 DIAGNOSIS — Z8249 Family history of ischemic heart disease and other diseases of the circulatory system: Secondary | ICD-10-CM

## 2021-03-10 DIAGNOSIS — E785 Hyperlipidemia, unspecified: Secondary | ICD-10-CM | POA: Diagnosis present

## 2021-03-10 DIAGNOSIS — N178 Other acute kidney failure: Secondary | ICD-10-CM

## 2021-03-10 DIAGNOSIS — T84032A Mechanical loosening of internal right knee prosthetic joint, initial encounter: Secondary | ICD-10-CM | POA: Diagnosis present

## 2021-03-10 DIAGNOSIS — Z96659 Presence of unspecified artificial knee joint: Secondary | ICD-10-CM

## 2021-03-10 DIAGNOSIS — I5032 Chronic diastolic (congestive) heart failure: Secondary | ICD-10-CM | POA: Diagnosis present

## 2021-03-10 DIAGNOSIS — Z7982 Long term (current) use of aspirin: Secondary | ICD-10-CM

## 2021-03-10 DIAGNOSIS — Z8616 Personal history of COVID-19: Secondary | ICD-10-CM | POA: Diagnosis not present

## 2021-03-10 DIAGNOSIS — Z86711 Personal history of pulmonary embolism: Secondary | ICD-10-CM

## 2021-03-10 DIAGNOSIS — G40A09 Absence epileptic syndrome, not intractable, without status epilepticus: Secondary | ICD-10-CM | POA: Diagnosis present

## 2021-03-10 DIAGNOSIS — I110 Hypertensive heart disease with heart failure: Secondary | ICD-10-CM | POA: Diagnosis present

## 2021-03-10 DIAGNOSIS — Z823 Family history of stroke: Secondary | ICD-10-CM

## 2021-03-10 DIAGNOSIS — G609 Hereditary and idiopathic neuropathy, unspecified: Secondary | ICD-10-CM | POA: Diagnosis present

## 2021-03-10 DIAGNOSIS — Z7951 Long term (current) use of inhaled steroids: Secondary | ICD-10-CM | POA: Diagnosis not present

## 2021-03-10 DIAGNOSIS — Z79899 Other long term (current) drug therapy: Secondary | ICD-10-CM | POA: Diagnosis not present

## 2021-03-10 DIAGNOSIS — Z9181 History of falling: Secondary | ICD-10-CM

## 2021-03-10 DIAGNOSIS — Z8673 Personal history of transient ischemic attack (TIA), and cerebral infarction without residual deficits: Secondary | ICD-10-CM | POA: Diagnosis not present

## 2021-03-10 DIAGNOSIS — I2510 Atherosclerotic heart disease of native coronary artery without angina pectoris: Secondary | ICD-10-CM | POA: Diagnosis present

## 2021-03-10 DIAGNOSIS — Y792 Prosthetic and other implants, materials and accessory orthopedic devices associated with adverse incidents: Secondary | ICD-10-CM | POA: Diagnosis present

## 2021-03-10 DIAGNOSIS — Z808 Family history of malignant neoplasm of other organs or systems: Secondary | ICD-10-CM

## 2021-03-10 DIAGNOSIS — F32A Depression, unspecified: Secondary | ICD-10-CM | POA: Diagnosis present

## 2021-03-10 DIAGNOSIS — N179 Acute kidney failure, unspecified: Secondary | ICD-10-CM

## 2021-03-10 HISTORY — PX: TOTAL KNEE REVISION: SHX996

## 2021-03-10 HISTORY — DX: Unspecified right bundle-branch block: I45.10

## 2021-03-10 HISTORY — DX: Unspecified viral hepatitis B without hepatic coma: B19.10

## 2021-03-10 HISTORY — DX: Acute embolism and thrombosis of unspecified vein: I82.90

## 2021-03-10 HISTORY — DX: Mild cognitive impairment of uncertain or unknown etiology: G31.84

## 2021-03-10 HISTORY — DX: Sleep apnea, unspecified: G47.30

## 2021-03-10 HISTORY — DX: Venous insufficiency (chronic) (peripheral): I87.2

## 2021-03-10 HISTORY — DX: Cerebral infarction, unspecified: I63.9

## 2021-03-10 HISTORY — DX: Lymphedema, not elsewhere classified: I89.0

## 2021-03-10 LAB — CBC
HCT: 30.8 % — ABNORMAL LOW (ref 39.0–52.0)
Hemoglobin: 11.3 g/dL — ABNORMAL LOW (ref 13.0–17.0)
MCH: 33.4 pg (ref 26.0–34.0)
MCHC: 36.7 g/dL — ABNORMAL HIGH (ref 30.0–36.0)
MCV: 91.1 fL (ref 80.0–100.0)
Platelets: 145 10*3/uL — ABNORMAL LOW (ref 150–400)
RBC: 3.38 MIL/uL — ABNORMAL LOW (ref 4.22–5.81)
RDW: 11.9 % (ref 11.5–15.5)
WBC: 6.9 10*3/uL (ref 4.0–10.5)
nRBC: 0 % (ref 0.0–0.2)

## 2021-03-10 LAB — BASIC METABOLIC PANEL
Anion gap: 7 (ref 5–15)
BUN: 16 mg/dL (ref 8–23)
CO2: 21 mmol/L — ABNORMAL LOW (ref 22–32)
Calcium: 7.9 mg/dL — ABNORMAL LOW (ref 8.9–10.3)
Chloride: 94 mmol/L — ABNORMAL LOW (ref 98–111)
Creatinine, Ser: 0.93 mg/dL (ref 0.61–1.24)
GFR, Estimated: >60 mL/min (ref >60)
Glucose, Bld: 175 mg/dL — ABNORMAL HIGH (ref 70–99)
Potassium: 4.9 mmol/L (ref 3.5–5.1)
Sodium: 122 mmol/L — ABNORMAL LOW (ref 135–145)

## 2021-03-10 LAB — MAGNESIUM: Magnesium: 1.8 mg/dL (ref 1.7–2.4)

## 2021-03-10 LAB — TROPONIN I (HIGH SENSITIVITY): Troponin I (High Sensitivity): 7 ng/L (ref <18)

## 2021-03-10 SURGERY — TOTAL KNEE REVISION
Anesthesia: Spinal | Site: Knee | Laterality: Right

## 2021-03-10 MED ORDER — FERROUS SULFATE 325 (65 FE) MG PO TABS
325.0000 mg | ORAL_TABLET | Freq: Two times a day (BID) | ORAL | Status: DC
  Administered 2021-03-11 – 2021-03-12 (×3): 325 mg via ORAL
  Filled 2021-03-10 (×3): qty 1

## 2021-03-10 MED ORDER — TETRACAINE HCL 1 % IJ SOLN
INTRAMUSCULAR | Status: DC | PRN
Start: 2021-03-10 — End: 2021-03-10
  Administered 2021-03-10: 10 mg via INTRASPINAL

## 2021-03-10 MED ORDER — TRANEXAMIC ACID-NACL 1000-0.7 MG/100ML-% IV SOLN: 1000.0000 mg | Freq: Once | INTRAVENOUS | Status: AC

## 2021-03-10 MED ORDER — OXYCODONE HCL 5 MG PO TABS
5.0000 mg | ORAL_TABLET | ORAL | Status: DC | PRN
  Filled 2021-03-10 (×2): qty 1

## 2021-03-10 MED ORDER — TETRACAINE HCL 1 % IJ SOLN
INTRAMUSCULAR | Status: AC
  Filled 2021-03-10: qty 2

## 2021-03-10 MED ORDER — LACTATED RINGERS IV SOLN: INTRAVENOUS | Status: DC

## 2021-03-10 MED ORDER — CELECOXIB 200 MG PO CAPS
ORAL_CAPSULE | ORAL | Status: AC
  Administered 2021-03-10: 400 mg via ORAL
  Filled 2021-03-10: qty 1

## 2021-03-10 MED ORDER — MORPHINE SULFATE (PF) 4 MG/ML IV SOLN
INTRAVENOUS | Status: AC
  Administered 2021-03-10: 2 mg via INTRAVENOUS
  Filled 2021-03-10: qty 1

## 2021-03-10 MED ORDER — ONDANSETRON HCL 4 MG/2ML IJ SOLN: 4.0000 mg | Freq: Four times a day (QID) | INTRAMUSCULAR | Status: DC | PRN

## 2021-03-10 MED ORDER — SODIUM CHLORIDE 0.9 % IR SOLN
Status: DC | PRN
  Administered 2021-03-10: 3000 mL

## 2021-03-10 MED ORDER — NYSTATIN-TRIAMCINOLONE 100000-0.1 UNIT/GM-% EX CREA
1.0000 "application " | TOPICAL_CREAM | Freq: Two times a day (BID) | CUTANEOUS | Status: DC
  Administered 2021-03-11 – 2021-03-12 (×3): 1 via TOPICAL
  Filled 2021-03-10: qty 15

## 2021-03-10 MED ORDER — SENNOSIDES-DOCUSATE SODIUM 8.6-50 MG PO TABS
1.0000 | ORAL_TABLET | Freq: Two times a day (BID) | ORAL | Status: DC
  Administered 2021-03-10 – 2021-03-12 (×4): 1 via ORAL
  Filled 2021-03-10 (×4): qty 1

## 2021-03-10 MED ORDER — BUPIVACAINE HCL 0.25 % IJ SOLN
INTRAMUSCULAR | Status: DC | PRN
  Administered 2021-03-10: 60 mL

## 2021-03-10 MED ORDER — LAMOTRIGINE 25 MG PO TABS
100.0000 mg | ORAL_TABLET | Freq: Every day | ORAL | Status: DC
  Administered 2021-03-10 – 2021-03-11 (×2): 100 mg via ORAL
  Filled 2021-03-10 (×2): qty 4

## 2021-03-10 MED ORDER — MONTELUKAST SODIUM 10 MG PO TABS
10.0000 mg | ORAL_TABLET | Freq: Every day | ORAL | Status: DC
  Administered 2021-03-10 – 2021-03-11 (×2): 10 mg via ORAL
  Filled 2021-03-10 (×2): qty 1

## 2021-03-10 MED ORDER — FENTANYL CITRATE (PF) 100 MCG/2ML IJ SOLN
INTRAMUSCULAR | Status: AC
  Administered 2021-03-10: 25 ug via INTRAVENOUS
  Filled 2021-03-10: qty 2

## 2021-03-10 MED ORDER — FENTANYL CITRATE (PF) 100 MCG/2ML IJ SOLN
INTRAMUSCULAR | Status: AC
  Filled 2021-03-10: qty 2

## 2021-03-10 MED ORDER — ACETAMINOPHEN 10 MG/ML IV SOLN
INTRAVENOUS | Status: AC
  Filled 2021-03-10: qty 100

## 2021-03-10 MED ORDER — TRANEXAMIC ACID-NACL 1000-0.7 MG/100ML-% IV SOLN
INTRAVENOUS | Status: AC
  Administered 2021-03-10: 1000 mg via INTRAVENOUS
  Filled 2021-03-10: qty 100

## 2021-03-10 MED ORDER — CEFAZOLIN SODIUM-DEXTROSE 2-4 GM/100ML-% IV SOLN
2.0000 g | Freq: Four times a day (QID) | INTRAVENOUS | Status: AC
  Administered 2021-03-10 – 2021-03-11 (×2): 2 g via INTRAVENOUS
  Filled 2021-03-10 (×2): qty 100

## 2021-03-10 MED ORDER — TRAZODONE HCL 100 MG PO TABS
100.0000 mg | ORAL_TABLET | Freq: Every day | ORAL | Status: DC
  Administered 2021-03-10 – 2021-03-11 (×2): 100 mg via ORAL
  Filled 2021-03-10 (×2): qty 1

## 2021-03-10 MED ORDER — TORSEMIDE 20 MG PO TABS
10.0000 mg | ORAL_TABLET | Freq: Every day | ORAL | Status: DC
  Administered 2021-03-11 – 2021-03-12 (×2): 10 mg via ORAL
  Filled 2021-03-10 (×2): qty 1

## 2021-03-10 MED ORDER — BISACODYL 10 MG RE SUPP
10.0000 mg | Freq: Every day | RECTAL | Status: DC | PRN
  Administered 2021-03-12: 10 mg via RECTAL
  Filled 2021-03-10: qty 1

## 2021-03-10 MED ORDER — NEOMYCIN-POLYMYXIN B GU 40-200000 IR SOLN
Status: DC | PRN
  Administered 2021-03-10: 14 mL

## 2021-03-10 MED ORDER — TRANEXAMIC ACID-NACL 1000-0.7 MG/100ML-% IV SOLN: 1000.0000 mg | INTRAVENOUS | Status: DC

## 2021-03-10 MED ORDER — BUPIVACAINE HCL (PF) 0.5 % IJ SOLN
INTRAMUSCULAR | Status: AC
  Filled 2021-03-10: qty 10

## 2021-03-10 MED ORDER — MORPHINE SULFATE (PF) 4 MG/ML IV SOLN
2.0000 mg | Freq: Once | INTRAVENOUS | Status: AC
  Administered 2021-03-10: 2 mg via INTRAVENOUS

## 2021-03-10 MED ORDER — 0.9 % SODIUM CHLORIDE (POUR BTL) OPTIME
TOPICAL | Status: DC | PRN
  Administered 2021-03-10: 500 mL

## 2021-03-10 MED ORDER — CELECOXIB 200 MG PO CAPS
200.0000 mg | ORAL_CAPSULE | Freq: Two times a day (BID) | ORAL | Status: DC
  Administered 2021-03-10 – 2021-03-12 (×3): 200 mg via ORAL
  Filled 2021-03-10 (×4): qty 1

## 2021-03-10 MED ORDER — DEXAMETHASONE SODIUM PHOSPHATE 10 MG/ML IJ SOLN: 8.0000 mg | Freq: Once | INTRAMUSCULAR | Status: AC

## 2021-03-10 MED ORDER — NITROGLYCERIN 0.4 MG SL SUBL: 0.4000 mg | SUBLINGUAL_TABLET | SUBLINGUAL | Status: DC | PRN

## 2021-03-10 MED ORDER — SODIUM CHLORIDE 0.9 % IV SOLN: INTRAVENOUS | Status: DC

## 2021-03-10 MED ORDER — ORAL CARE MOUTH RINSE: 15.0000 mL | Freq: Once | OROMUCOSAL | Status: AC

## 2021-03-10 MED ORDER — EPHEDRINE SULFATE 50 MG/ML IJ SOLN
INTRAMUSCULAR | Status: DC | PRN
  Administered 2021-03-10: 5 mg via INTRAVENOUS
  Administered 2021-03-10 (×2): 10 mg via INTRAVENOUS

## 2021-03-10 MED ORDER — SODIUM CHLORIDE 0.9 % IV SOLN
INTRAVENOUS | Status: DC | PRN
  Administered 2021-03-10: 60 mL

## 2021-03-10 MED ORDER — FLUTICASONE FUROATE-VILANTEROL 100-25 MCG/ACT IN AEPB
1.0000 | INHALATION_SPRAY | Freq: Every day | RESPIRATORY_TRACT | Status: DC
  Administered 2021-03-11 – 2021-03-12 (×2): 1 via RESPIRATORY_TRACT
  Filled 2021-03-10: qty 28

## 2021-03-10 MED ORDER — TRAMADOL HCL 50 MG PO TABS
50.0000 mg | ORAL_TABLET | ORAL | Status: DC | PRN
  Administered 2021-03-11 – 2021-03-12 (×3): 100 mg via ORAL
  Filled 2021-03-10 (×3): qty 2

## 2021-03-10 MED ORDER — BUDESONIDE 0.5 MG/2ML IN SUSP: 0.5000 mg | Freq: Two times a day (BID) | RESPIRATORY_TRACT | Status: DC | PRN

## 2021-03-10 MED ORDER — CELECOXIB 200 MG PO CAPS: 400.0000 mg | ORAL_CAPSULE | Freq: Once | ORAL | Status: AC

## 2021-03-10 MED ORDER — FENTANYL CITRATE (PF) 100 MCG/2ML IJ SOLN
INTRAMUSCULAR | Status: DC | PRN
Start: 2021-03-10 — End: 2021-03-10
  Administered 2021-03-10 (×2): 50 ug via INTRAVENOUS
  Administered 2021-03-10 (×2): 25 ug via INTRAVENOUS

## 2021-03-10 MED ORDER — VITAMIN D (ERGOCALCIFEROL) 1.25 MG (50000 UNIT) PO CAPS
50000.0000 [IU] | ORAL_CAPSULE | ORAL | Status: DC
  Administered 2021-03-11: 50000 [IU] via ORAL
  Filled 2021-03-10: qty 1

## 2021-03-10 MED ORDER — GABAPENTIN 300 MG PO CAPS: 300.0000 mg | ORAL_CAPSULE | Freq: Once | ORAL | Status: AC

## 2021-03-10 MED ORDER — PROPOFOL 10 MG/ML IV BOLUS
INTRAVENOUS | Status: AC
  Filled 2021-03-10: qty 20

## 2021-03-10 MED ORDER — PHENYLEPHRINE HCL-NACL 20-0.9 MG/250ML-% IV SOLN
INTRAVENOUS | Status: DC | PRN
  Administered 2021-03-10: 50 ug/min via INTRAVENOUS

## 2021-03-10 MED ORDER — MORPHINE SULFATE (PF) 4 MG/ML IV SOLN
INTRAVENOUS | Status: AC
  Filled 2021-03-10: qty 1

## 2021-03-10 MED ORDER — BUPIVACAINE HCL (PF) 0.5 % IJ SOLN
INTRAMUSCULAR | Status: DC | PRN
  Administered 2021-03-10: 2 mL via INTRATHECAL

## 2021-03-10 MED ORDER — OXYCODONE HCL 5 MG PO TABS
10.0000 mg | ORAL_TABLET | ORAL | Status: DC | PRN
  Administered 2021-03-10 – 2021-03-12 (×4): 10 mg via ORAL
  Filled 2021-03-10 (×3): qty 2

## 2021-03-10 MED ORDER — IRBESARTAN 150 MG PO TABS
300.0000 mg | ORAL_TABLET | Freq: Every day | ORAL | Status: DC
  Administered 2021-03-11 – 2021-03-12 (×2): 300 mg via ORAL
  Filled 2021-03-10 (×2): qty 2

## 2021-03-10 MED ORDER — TRANEXAMIC ACID-NACL 1000-0.7 MG/100ML-% IV SOLN
INTRAVENOUS | Status: AC
  Filled 2021-03-10: qty 100

## 2021-03-10 MED ORDER — PROPOFOL 10 MG/ML IV BOLUS
INTRAVENOUS | Status: AC
  Filled 2021-03-10: qty 60

## 2021-03-10 MED ORDER — MAGNESIUM OXIDE -MG SUPPLEMENT 400 (240 MG) MG PO TABS
400.0000 mg | ORAL_TABLET | Freq: Every day | ORAL | Status: DC
  Administered 2021-03-10 – 2021-03-11 (×2): 400 mg via ORAL
  Filled 2021-03-10 (×2): qty 1

## 2021-03-10 MED ORDER — SODIUM CHLORIDE (PF) 0.9 % IJ SOLN
INTRAMUSCULAR | Status: AC
  Filled 2021-03-10: qty 20

## 2021-03-10 MED ORDER — FENTANYL CITRATE (PF) 100 MCG/2ML IJ SOLN
50.0000 ug | Freq: Once | INTRAMUSCULAR | Status: AC
  Administered 2021-03-10: 50 ug via INTRAVENOUS

## 2021-03-10 MED ORDER — CEFAZOLIN SODIUM-DEXTROSE 2-4 GM/100ML-% IV SOLN
INTRAVENOUS | Status: AC
  Filled 2021-03-10: qty 100

## 2021-03-10 MED ORDER — DIPHENHYDRAMINE HCL 12.5 MG/5ML PO ELIX: 12.5000 mg | ORAL_SOLUTION | ORAL | Status: DC | PRN

## 2021-03-10 MED ORDER — ACETAMINOPHEN 10 MG/ML IV SOLN
1000.0000 mg | Freq: Four times a day (QID) | INTRAVENOUS | Status: AC
  Administered 2021-03-10 – 2021-03-11 (×4): 1000 mg via INTRAVENOUS
  Filled 2021-03-10 (×5): qty 100

## 2021-03-10 MED ORDER — MEMANTINE HCL ER 28 MG PO CP24
28.0000 mg | ORAL_CAPSULE | Freq: Every day | ORAL | Status: DC
  Administered 2021-03-11 – 2021-03-12 (×2): 28 mg via ORAL
  Filled 2021-03-10 (×2): qty 1

## 2021-03-10 MED ORDER — ACETAMINOPHEN 325 MG PO TABS: 325.0000 mg | ORAL_TABLET | Freq: Four times a day (QID) | ORAL | Status: DC | PRN

## 2021-03-10 MED ORDER — METOCLOPRAMIDE HCL 10 MG PO TABS
10.0000 mg | ORAL_TABLET | Freq: Three times a day (TID) | ORAL | Status: DC
  Administered 2021-03-10 – 2021-03-12 (×6): 10 mg via ORAL
  Filled 2021-03-10 (×6): qty 1

## 2021-03-10 MED ORDER — ACETAMINOPHEN 10 MG/ML IV SOLN
INTRAVENOUS | Status: DC | PRN
  Administered 2021-03-10: 1000 mg via INTRAVENOUS

## 2021-03-10 MED ORDER — CHLORHEXIDINE GLUCONATE 0.12 % MT SOLN
OROMUCOSAL | Status: AC
  Administered 2021-03-10: 15 mL via OROMUCOSAL
  Filled 2021-03-10: qty 15

## 2021-03-10 MED ORDER — PHENYLEPHRINE HCL (PRESSORS) 10 MG/ML IV SOLN
INTRAVENOUS | Status: DC | PRN
  Administered 2021-03-10: 160 ug via INTRAVENOUS
  Administered 2021-03-10: 80 ug via INTRAVENOUS

## 2021-03-10 MED ORDER — GABAPENTIN 300 MG PO CAPS
ORAL_CAPSULE | ORAL | Status: AC
  Administered 2021-03-10: 300 mg via ORAL
  Filled 2021-03-10: qty 1

## 2021-03-10 MED ORDER — PANTOPRAZOLE SODIUM 40 MG PO TBEC
40.0000 mg | DELAYED_RELEASE_TABLET | Freq: Two times a day (BID) | ORAL | Status: DC
  Administered 2021-03-10 – 2021-03-12 (×4): 40 mg via ORAL
  Filled 2021-03-10 (×4): qty 1

## 2021-03-10 MED ORDER — PROPOFOL 500 MG/50ML IV EMUL
INTRAVENOUS | Status: DC | PRN
  Administered 2021-03-10: 50 ug/kg/min via INTRAVENOUS

## 2021-03-10 MED ORDER — HYDROMORPHONE HCL 1 MG/ML IJ SOLN
0.5000 mg | INTRAMUSCULAR | Status: DC | PRN
  Administered 2021-03-10 – 2021-03-12 (×5): 1 mg via INTRAVENOUS
  Filled 2021-03-10 (×5): qty 1

## 2021-03-10 MED ORDER — GABAPENTIN 400 MG PO CAPS
400.0000 mg | ORAL_CAPSULE | Freq: Every day | ORAL | Status: DC
  Administered 2021-03-10 – 2021-03-11 (×2): 400 mg via ORAL
  Filled 2021-03-10 (×2): qty 1

## 2021-03-10 MED ORDER — DULOXETINE HCL 30 MG PO CPEP
30.0000 mg | ORAL_CAPSULE | Freq: Every day | ORAL | Status: DC
  Administered 2021-03-11 – 2021-03-12 (×2): 30 mg via ORAL
  Filled 2021-03-10 (×2): qty 1

## 2021-03-10 MED ORDER — FENTANYL CITRATE (PF) 100 MCG/2ML IJ SOLN
25.0000 ug | INTRAMUSCULAR | Status: DC | PRN
  Administered 2021-03-10 (×3): 25 ug via INTRAVENOUS

## 2021-03-10 MED ORDER — CHLORHEXIDINE GLUCONATE 4 % EX LIQD: 60.0000 mL | Freq: Once | CUTANEOUS | Status: DC

## 2021-03-10 MED ORDER — VALACYCLOVIR HCL 500 MG PO TABS
1000.0000 mg | ORAL_TABLET | Freq: Every day | ORAL | Status: DC | PRN
  Filled 2021-03-10: qty 2

## 2021-03-10 MED ORDER — PHENOL 1.4 % MT LIQD
1.0000 | OROMUCOSAL | Status: DC | PRN
  Filled 2021-03-10: qty 177

## 2021-03-10 MED ORDER — SODIUM CHLORIDE 0.9 % IV SOLN: INTRAVENOUS | Status: DC | PRN

## 2021-03-10 MED ORDER — ARTIFICIAL TEARS OPHTHALMIC OINT
TOPICAL_OINTMENT | OPHTHALMIC | Status: DC | PRN
  Administered 2021-03-10: 1 via OPHTHALMIC

## 2021-03-10 MED ORDER — CELECOXIB 200 MG PO CAPS
ORAL_CAPSULE | ORAL | Status: AC
  Administered 2021-03-11: 200 mg via ORAL
  Filled 2021-03-10: qty 1

## 2021-03-10 MED ORDER — FENTANYL CITRATE (PF) 100 MCG/2ML IJ SOLN
INTRAMUSCULAR | Status: AC
  Administered 2021-03-10: 50 ug via INTRAVENOUS
  Filled 2021-03-10: qty 2

## 2021-03-10 MED ORDER — MAGNESIUM HYDROXIDE 400 MG/5ML PO SUSP
30.0000 mL | Freq: Every day | ORAL | Status: DC
  Administered 2021-03-11 – 2021-03-12 (×2): 30 mL via ORAL
  Filled 2021-03-10 (×2): qty 30

## 2021-03-10 MED ORDER — PROPOFOL 500 MG/50ML IV EMUL
INTRAVENOUS | Status: AC
  Filled 2021-03-10: qty 100

## 2021-03-10 MED ORDER — CEFAZOLIN SODIUM-DEXTROSE 2-4 GM/100ML-% IV SOLN
2.0000 g | INTRAVENOUS | Status: AC
  Administered 2021-03-10 (×2): 2 g via INTRAVENOUS

## 2021-03-10 MED ORDER — ONDANSETRON HCL 4 MG PO TABS: 4.0000 mg | ORAL_TABLET | Freq: Four times a day (QID) | ORAL | Status: DC | PRN

## 2021-03-10 MED ORDER — ALUM & MAG HYDROXIDE-SIMETH 200-200-20 MG/5ML PO SUSP: 30.0000 mL | ORAL | Status: DC | PRN

## 2021-03-10 MED ORDER — METOPROLOL SUCCINATE ER 50 MG PO TB24
50.0000 mg | ORAL_TABLET | Freq: Two times a day (BID) | ORAL | Status: DC
  Administered 2021-03-10 – 2021-03-12 (×4): 50 mg via ORAL
  Filled 2021-03-10 (×4): qty 1

## 2021-03-10 MED ORDER — MORPHINE SULFATE (PF) 4 MG/ML IV SOLN: 2.0000 mg | Freq: Once | INTRAVENOUS | Status: AC

## 2021-03-10 MED ORDER — LORAZEPAM 0.5 MG PO TABS
0.5000 mg | ORAL_TABLET | Freq: Two times a day (BID) | ORAL | Status: DC
  Administered 2021-03-10 – 2021-03-12 (×4): 0.5 mg via ORAL
  Filled 2021-03-10 (×4): qty 1

## 2021-03-10 MED ORDER — LIDOCAINE HCL (PF) 2 % IJ SOLN
INTRAMUSCULAR | Status: AC
  Filled 2021-03-10: qty 5

## 2021-03-10 MED ORDER — ENSURE PRE-SURGERY PO LIQD
296.0000 mL | Freq: Once | ORAL | Status: DC
  Filled 2021-03-10: qty 296

## 2021-03-10 MED ORDER — PRONTOSAN WOUND IRRIGATION OPTIME
TOPICAL | Status: DC | PRN
  Administered 2021-03-10: 1 via TOPICAL

## 2021-03-10 MED ORDER — ENOXAPARIN SODIUM 30 MG/0.3ML IJ SOSY
30.0000 mg | PREFILLED_SYRINGE | Freq: Two times a day (BID) | INTRAMUSCULAR | Status: DC
  Administered 2021-03-11 – 2021-03-12 (×3): 30 mg via SUBCUTANEOUS
  Filled 2021-03-10 (×3): qty 0.3

## 2021-03-10 MED ORDER — PROPOFOL 1000 MG/100ML IV EMUL
INTRAVENOUS | Status: AC
  Filled 2021-03-10: qty 100

## 2021-03-10 MED ORDER — ARTIFICIAL TEARS OPHTHALMIC OINT
TOPICAL_OINTMENT | OPHTHALMIC | Status: AC
  Filled 2021-03-10: qty 3.5

## 2021-03-10 MED ORDER — ALBUTEROL SULFATE (2.5 MG/3ML) 0.083% IN NEBU: 3.0000 mL | INHALATION_SOLUTION | RESPIRATORY_TRACT | Status: DC | PRN

## 2021-03-10 MED ORDER — CHLORHEXIDINE GLUCONATE 0.12 % MT SOLN: 15.0000 mL | Freq: Once | OROMUCOSAL | Status: AC

## 2021-03-10 MED ORDER — CEFAZOLIN SODIUM 1 G IJ SOLR
INTRAMUSCULAR | Status: AC
  Filled 2021-03-10: qty 20

## 2021-03-10 MED ORDER — MENTHOL 3 MG MT LOZG
1.0000 | LOZENGE | OROMUCOSAL | Status: DC | PRN
  Filled 2021-03-10: qty 9

## 2021-03-10 MED ORDER — ONDANSETRON HCL 4 MG/2ML IJ SOLN: 4.0000 mg | Freq: Once | INTRAMUSCULAR | Status: DC | PRN

## 2021-03-10 MED ORDER — DEXAMETHASONE SODIUM PHOSPHATE 10 MG/ML IJ SOLN
INTRAMUSCULAR | Status: AC
  Administered 2021-03-10: 8 mg via INTRAVENOUS
  Filled 2021-03-10: qty 1

## 2021-03-10 MED ORDER — FLEET ENEMA 7-19 GM/118ML RE ENEM: 1.0000 | ENEMA | Freq: Once | RECTAL | Status: DC | PRN

## 2021-03-10 SURGICAL SUPPLY — 82 items
BLADE OSCILLATING/SAGITTAL (BLADE) ×1
BLADE SAGITTAL 25.0X1.19X90 (BLADE) ×1 IMPLANT
BLADE SAW 70X12.5 (BLADE) ×1 IMPLANT
BLADE SAW 90X13X1.19 OSCILLAT (BLADE) ×2 IMPLANT
BLADE SAW 90X25X1.19 OSCILLAT (BLADE) ×2 IMPLANT
BLADE SAW SGTL 13X75X1.27 (BLADE) ×1 IMPLANT
BLADE SW THK.38XMED LNG THN (BLADE) ×1 IMPLANT
BONE CEMENT GENTAMICIN (Cement) ×4 IMPLANT
CEMENT BONE GENTAMICIN 40 (Cement) ×2 IMPLANT
CNTNR SPEC 2.5X3XGRAD LEK (MISCELLANEOUS) ×2
CONT SPEC 4OZ STER OR WHT (MISCELLANEOUS) ×2
CONTAINER SPEC 2.5X3XGRAD LEK (MISCELLANEOUS) ×4 IMPLANT
COOLER POLAR GLACIER W/PUMP (MISCELLANEOUS) ×2 IMPLANT
COVER BACK TABLE REUSABLE LG (DRAPES) ×2 IMPLANT
DRAPE 3/4 80X56 (DRAPES) ×4 IMPLANT
DRESSING PEEL AND PLAC PRVNA20 (GAUZE/BANDAGES/DRESSINGS) IMPLANT
DRSG DERMACEA 8X12 NADH (GAUZE/BANDAGES/DRESSINGS) ×2 IMPLANT
DRSG MEPILEX SACRM 8.7X9.8 (GAUZE/BANDAGES/DRESSINGS) ×2 IMPLANT
DRSG OPSITE POSTOP 4X14 (GAUZE/BANDAGES/DRESSINGS) ×2 IMPLANT
DRSG PEEL AND PLACE PREVENA 20 (GAUZE/BANDAGES/DRESSINGS) ×2
DRSG TEGADERM 4X4.75 (GAUZE/BANDAGES/DRESSINGS) ×2 IMPLANT
DURAPREP 26ML APPLICATOR (WOUND CARE) ×4 IMPLANT
ELECT CAUTERY BLADE 6.4 (BLADE) ×2 IMPLANT
ELECT REM PT RETURN 9FT ADLT (ELECTROSURGICAL) ×2
ELECTRODE REM PT RTRN 9FT ADLT (ELECTROSURGICAL) ×1 IMPLANT
GLOVE SRG 8 PF TXTR STRL LF DI (GLOVE) ×1 IMPLANT
GLOVE SURG ENC TEXT LTX SZ7.5 (GLOVE) ×10 IMPLANT
GLOVE SURG UNDER LTX SZ8 (GLOVE) ×2 IMPLANT
GLOVE SURG UNDER POLY LF SZ7.5 (GLOVE) ×2 IMPLANT
GLOVE SURG UNDER POLY LF SZ8 (GLOVE) ×1
GOWN STRL REUS W/ TWL LRG LVL3 (GOWN DISPOSABLE) ×2 IMPLANT
GOWN STRL REUS W/TWL LRG LVL3 (GOWN DISPOSABLE) ×4
HANDPIECE VERSAJET DEBRIDEMENT (MISCELLANEOUS) ×1 IMPLANT
HEMOVAC 400CC 10FR (MISCELLANEOUS) ×2 IMPLANT
HOLDER FOLEY CATH W/STRAP (MISCELLANEOUS) ×2 IMPLANT
INSERT TIBIAL PFC 3 17.5 (Knees) ×1 IMPLANT
IV NS IRRIG 3000ML ARTHROMATIC (IV SOLUTION) ×2 IMPLANT
KIT DRSG PREVENA PLUS 7DAY 125 (MISCELLANEOUS) ×1 IMPLANT
KIT TURNOVER KIT A (KITS) ×2 IMPLANT
KNIFE SCULPS 14X20 (INSTRUMENTS) ×2 IMPLANT
MANIFOLD NEPTUNE II (INSTRUMENTS) ×4 IMPLANT
NDL SAFETY ECLIPSE 18X1.5 (NEEDLE) ×1 IMPLANT
NDL SPNL 18GX3.5 QUINCKE PK (NEEDLE) ×1 IMPLANT
NDL SPNL 20GX3.5 QUINCKE YW (NEEDLE) ×2 IMPLANT
NEEDLE HYPO 18GX1.5 SHARP (NEEDLE) ×1
NEEDLE SPNL 18GX3.5 QUINCKE PK (NEEDLE) ×2 IMPLANT
NEEDLE SPNL 20GX3.5 QUINCKE YW (NEEDLE) ×4 IMPLANT
NS IRRIG 500ML POUR BTL (IV SOLUTION) ×1 IMPLANT
PACK TOTAL KNEE (MISCELLANEOUS) ×2 IMPLANT
PAD ABD DERMACEA PRESS 5X9 (GAUZE/BANDAGES/DRESSINGS) ×2 IMPLANT
PAD WRAPON POLAR KNEE (MISCELLANEOUS) ×1 IMPLANT
PATELLA DOME PFC 38MM (Knees) ×1 IMPLANT
PENCIL SMOKE EVACUATOR COATED (MISCELLANEOUS) ×2 IMPLANT
PULSAVAC PLUS IRRIG FAN TIP (DISPOSABLE) ×4
SLEEVE MBT PROUS ML 29MM (Knees) ×1 IMPLANT
SOL PREP PVP 2OZ (MISCELLANEOUS) ×2
SOLUTION PREP PVP 2OZ (MISCELLANEOUS) ×1 IMPLANT
SOLUTION PRONTOSAN WOUND 350ML (IRRIGATION / IRRIGATOR) ×2 IMPLANT
SPONGE DRAIN TRACH 4X4 STRL 2S (GAUZE/BANDAGES/DRESSINGS) ×2 IMPLANT
SPONGE T-LAP 18X18 ~~LOC~~+RFID (SPONGE) ×9 IMPLANT
STAPLER SKIN PROX 35W (STAPLE) ×2 IMPLANT
STEM UNIVERSAL REVISION 75X12 (Stem) ×1 IMPLANT
SUCTION FRAZIER HANDLE 10FR (MISCELLANEOUS) ×1
SUCTION TUBE FRAZIER 10FR DISP (MISCELLANEOUS) ×1 IMPLANT
SUT MNCRL 0 1X36 CT-1 (SUTURE) ×1 IMPLANT
SUT MON AB 2-0 CT1 36 (SUTURE) ×2 IMPLANT
SUT MONOCRYL 0 (SUTURE) ×1
SUT PROLENE 1 CT 1 30 (SUTURE) ×2 IMPLANT
SUT VIC AB 0 CT1 36 (SUTURE) ×2 IMPLANT
SUT VIC AB 1 CT1 36 (SUTURE) ×5 IMPLANT
SUT VIC AB 2-0 CT1 (SUTURE) ×2 IMPLANT
SWAB CULTURE AMIES ANAERIB BLU (MISCELLANEOUS) ×11 IMPLANT
SYR 20ML LL LF (SYRINGE) ×2 IMPLANT
SYR 30ML LL (SYRINGE) ×4 IMPLANT
TIP BRUSH PULSAVAC PLUS 24.33 (MISCELLANEOUS) ×2 IMPLANT
TIP FAN IRRIG PULSAVAC PLUS (DISPOSABLE) ×1 IMPLANT
TOWEL OR 17X26 4PK STRL BLUE (TOWEL DISPOSABLE) ×2 IMPLANT
TOWER CARTRIDGE SMART MIX (DISPOSABLE) ×2 IMPLANT
TRAY FOLEY MTR SLVR 16FR STAT (SET/KITS/TRAYS/PACK) ×2 IMPLANT
TRAY REVISION SZ 3 15MM (Joint) ×1 IMPLANT
WATER STERILE IRR 1000ML POUR (IV SOLUTION) ×2 IMPLANT
WRAPON POLAR PAD KNEE (MISCELLANEOUS) ×2

## 2021-03-10 NOTE — Anesthesia Procedure Notes (Signed)
Date/Time: 03/10/2021 12:24 PM Performed by: Demetrius Charity, CRNA Pre-anesthesia Checklist: Patient identified, Patient being monitored, Timeout performed, Emergency Drugs available and Suction available Patient Re-evaluated:Patient Re-evaluated prior to induction Oxygen Delivery Method: Nasal cannula Ventilation: Oral airway inserted - appropriate to patient size Dental Injury: Teeth and Oropharynx as per pre-operative assessment

## 2021-03-10 NOTE — Anesthesia Preprocedure Evaluation (Signed)
Anesthesia Evaluation  Patient identified by MRN, date of birth, ID band Patient awake    Reviewed: Allergy & Precautions, H&P , NPO status , Patient's Chart, lab work & pertinent test results, reviewed documented beta blocker date and time   History of Anesthesia Complications Negative for: history of anesthetic complications  Airway Mallampati: III  TM Distance: >3 FB Neck ROM: full    Dental  (+) Dental Advidsory Given, Caps   Pulmonary neg shortness of breath, asthma , sleep apnea and Continuous Positive Airway Pressure Ventilation , neg COPD, neg recent URI,    Pulmonary exam normal breath sounds clear to auscultation       Cardiovascular Exercise Tolerance: Good hypertension, (-) angina+ CAD, + Cardiac Stents and +CHF  (-) Past MI and (-) CABG Normal cardiovascular exam+ dysrhythmias (RBBB) + Valvular Problems/Murmurs  Rhythm:regular Rate:Normal     Neuro/Psych Seizures - (none since the 90s), Well Controlled,  PSYCHIATRIC DISORDERS Anxiety Depression  Neuromuscular disease    GI/Hepatic Neg liver ROS, hiatal hernia, GERD  ,  Endo/Other  negative endocrine ROS  Renal/GU negative Renal ROS  negative genitourinary   Musculoskeletal   Abdominal   Peds  Hematology negative hematology ROS (+)   Anesthesia Other Findings Past Medical History: 02/27/2014: (HFpEF) heart failure with preserved ejection fraction  ([place])     Comment:  a.) TTE 02/27/14: EF >55%; mod LVH; mild panvalvular               regurgitation; (+) global stress induced HK. b.) TTE               09/20/14: EF 55%; triv AR/MR, mild TR/PR; mild AS; BAE;               c.) TTE 06/20/15: EF 55%, triv AR/MR; BAE; mild AS. d.)               TTE 02/11/17: EF 55; significant  RV dilation with mod               systolic dysfunction; mild AS. e.) TTE 02/27/19: EF 55%;               mild AS. f.) TTE 06/04/20: EF 50%; mild AS No date: Anxiety 09/20/2014: Aortic  stenosis     Comment:  a.) TTE 09/20/2014: mild; MPG?. b.) TTE 06/20/2015:               mild; MPG 8.0 mmHg. c.) TTE 02/11/2017: mild; MPG 12.4               mmHg. d.) TTE 02/27/2019: mild; MPG 11.4 mmHg. e.) TTE               06/04/2020: mild; MPG 9.6 mmHg. No date: Arthritis No date: Asthma No date: CAD (coronary artery disease) No date: Cataracts, bilateral No date: Chronic back pain No date: Chronic venous insufficiency No date: Complication of anesthesia     Comment:  a.) postoperative amnesia (1999). b.) seizures after               partial knee in 2014; sent to [facility] x 5 days d/t               uncontrollable shaking and AMS; ?? fentanyl induced               tremors. c.) anesthesia awareness 05/2018: COVID-19 No date: CVA (cerebral vascular accident) ([place]) No date: Depressed No date: Essential tremor No date: GERD (gastroesophageal reflux disease)  No date: Hepatitis B     Comment:  a.) h/o in the 90s; reports not active at present No date: History of bronchitis     Comment:  Jan-Mar 2017 No date: History of hiatal hernia No date: Hyperlipidemia No date: Hypertension No date: Insomnia No date: Joint pain No date: Lymphedema No date: Mild cognitive impairment No date: Muscle spasm     Comment:  in neck.Takes Flexeril as needed No date: Numbness     Comment:  right knee,right hand,and toes on left No date: RBBB No date: Seizures (HCC)     Comment:  Petit Mal. takes Lamotrigine daily. Last seizure 6+ yrs               ago No date: Sleep apnea with use of continuous positive airway pressure  (CPAP) No date: VTE (venous thromboembolism)     Comment:  a.) s/p RIGHT TKA   Reproductive/Obstetrics negative OB ROS                             Anesthesia Physical Anesthesia Plan  ASA: 3  Anesthesia Plan: Spinal   Post-op Pain Management:    Induction:   PONV Risk Score and Plan: 1 and Midazolam, Propofol infusion and TIVA  Airway  Management Planned: Natural Airway and Simple Face Mask  Additional Equipment:   Intra-op Plan:   Post-operative Plan:   Informed Consent: I have reviewed the patients History and Physical, chart, labs and discussed the procedure including the risks, benefits and alternatives for the proposed anesthesia with the patient or authorized representative who has indicated his/her understanding and acceptance.     Dental Advisory Given  Plan Discussed with: Anesthesiologist, CRNA and Surgeon  Anesthesia Plan Comments:         Anesthesia Quick Evaluation

## 2021-03-10 NOTE — Anesthesia Procedure Notes (Signed)
Spinal ° °Patient location during procedure: OR °Start time: 03/10/2021 11:55 AM °End time: 03/10/2021 12:10 PM °Reason for block: surgical anesthesia °Staffing °Performed: resident/CRNA  °Anesthesiologist: Karenz, Andrew, MD °Resident/CRNA: , , CRNA °Other anesthesia staff: Bachich, Jennifer, CRNA °Preanesthetic Checklist °Completed: patient identified, IV checked, site marked, risks and benefits discussed, surgical consent, monitors and equipment checked, pre-op evaluation and timeout performed °Spinal Block °Patient position: sitting °Prep: ChloraPrep °Patient monitoring: heart rate, continuous pulse ox, blood pressure and cardiac monitor °Approach: midline °Location: L2-3 °Injection technique: single-shot °Needle °Needle type: Whitacre and Introducer  °Needle gauge: 25 G °Needle length: 9 cm °Assessment °Sensory level: T10 °Events: CSF return °Additional Notes °C with unsuccessful attempt.  Dr. Karenz notified and J Bachich with successful attempt.  Negative paresthesia. Negative blood return. Positive free-flowing CSF. Expiration date of kit checked and confirmed. Patient tolerated procedure well, without complications. ° ° ° ° ° °

## 2021-03-10 NOTE — Anesthesia Postprocedure Evaluation (Signed)
Anesthesia Post Note  Patient: [name]  Procedure(s) Performed: TOTAL KNEE REVISION (Right: Knee)  Patient location during evaluation: PACU Anesthesia Type: Spinal Level of consciousness: awake and alert Pain management: pain level controlled Vital Signs Assessment: post-procedure vital signs reviewed and stable Respiratory status: spontaneous breathing and respiratory function stable Cardiovascular status: blood pressure returned to baseline and stable Postop Assessment: spinal receding and no headache Anesthetic complications: no Comments: Patient initially reporting chest pain and knee pain in PACU.  Ordered 12-lead ECG, unchanged from prior.  Labs normal.  Treated pain with morphine and fentanyl with resolution of chest pain, and improvement of knee pain.   No notable events documented.   Last Vitals:  Vitals:   03/10/21 2035 03/10/21 2040  BP:  108/66  Pulse: 82 90  Resp: (!) 7 (!) 24  Temp:    SpO2: 97% 95%    Last Pain:  Vitals:   03/10/21 2030  TempSrc:   PainSc: 0-No pain                 Darrin Nipper

## 2021-03-10 NOTE — Transfer of Care (Signed)
Immediate Anesthesia Transfer of Care Note  Patient: Johnny Mccoy  Procedure(s) Performed: TOTAL KNEE REVISION (Right: Knee)  Patient Location: PACU  Anesthesia Type:Spinal  Level of Consciousness: drowsy and patient cooperative  Airway & Oxygen Therapy: Patient Spontanous Breathing and Patient connected to nasal cannula oxygen  Post-op Assessment: Report given to RN  Post vital signs: Reviewed and stable  Last Vitals:  Vitals Value Taken Time  BP 128/75 03/10/21 1845  Temp    Pulse 89 03/10/21 1850  Resp 29 03/10/21 1850  SpO2 97 % 03/10/21 1850  Vitals shown include unvalidated device data.  Last Pain:  Vitals:   03/10/21 0946  TempSrc: Temporal  PainSc: 0-No pain         Complications: No notable events documented.

## 2021-03-10 NOTE — Op Note (Signed)
OPERATIVE NOTE  DATE OF SURGERY:  03/10/2021  PATIENT NAME:  Johnny Mccoy   DOB: [birth year]  MRN: [id number]  PRE-OPERATIVE DIAGNOSIS: Loose implants, status post right total knee arthroplasty  POST-OPERATIVE DIAGNOSIS:  Same  PROCEDURE:  Right knee revision arthroplasty (tibial and patellar components)  SURGEON:  Marciano Sequin. M.D.  ASSISTANT: Cassell Smiles, PA-C (present and scrubbed throughout the case, critical for assistance with exposure, retraction, instrumentation, and closure)  ANESTHESIA: spinal  ESTIMATED BLOOD LOSS: 150 mL  FLUIDS REPLACED: 2000 mL of crystalloid  TOURNIQUET TIME: #1-120 minutes #2-31 minutes  DRAINS: 2 medium Hemovac drains  IMPLANTS UTILIZED: DePuy size 3 15 mm MBT revision thick tray, 29 mm MBT revision metaphyseal sleeve (porous-coated), 75 mm x 12 mm universal fluted stem, 17.5 mm stabilized rotating platform polyethylene insert, and a 38 mm oval dome patella.  INDICATIONS FOR SURGERY: Johnny Mccoy is a 76 y.o. year old male who previously underwent conversion of a right unicompartmental knee arthroplasty to a total knee arthroplasty.  He did well for a number of years but apparently sustained a fall and had increased pain and swelling to the right knee.  Radiographs and three-phase bone scan demonstrated evidence of implant loosening. After discussion of the risks and benefits of surgical intervention, the patient expressed understanding of the risks benefits and agree with plans for revision total knee arthroplasty.   The risks, benefits, and alternatives were discussed at length including but not limited to the risks of infection, bleeding, nerve injury, stiffness, blood clots, the need for revision surgery, cardiopulmonary complications, among others, and they were willing to proceed.  PROCEDURE IN DETAIL: The patient was brought into the operating room and, after adequate spinal anesthesia was achieved, a tourniquet was placed on the patient's  upper thigh. The patient's knee and leg were cleaned and prepped with alcohol and DuraPrep and draped in the usual sterile fashion. A "timeout" was performed as per usual protocol. The lower extremity was exsanguinated using an Esmarch, and the tourniquet was inflated to 300 mmHg. An anterior longitudinal incision was made in line with the previous surgical scar.  A standard medial parapatellar approach was utilized.  Swabs were obtained of the synovial fluid and submitted for stat gram stain and cultures.. The deep fibers of the medial collateral ligament were elevated in a subperiosteal fashion off of the medial flare of the tibia so as to maintain a continuous soft tissue sleeve. The patella was subluxed laterally.  Inspection of the patellar component demonstrated significant wear with evidence of delamination of the polyethylene.  The medial and lateral gutters were reestablished by performing an extensive synovectomy of the fibrotic tissue.  Tissue was submitted for pathology and cultures.  Next, the sagittal saw was used to cut the polyethylene post so as to easily remove the polyethylene insert.  There was no significant wear of the polyethylene insert.  The implant bone interface of the femoral component was debrided of soft tissue using curettes and rongeurs.  The interface was intact without evidence of loosening.  Next, attention was directed to the tibial component.  There was gross motion of the tibial component.  The tibial component was elevated without difficulty.  Inspection of the underlying bone demonstrated fibrotic tissue and delamination of the cement along the medial plateau.  The cement mantle was debrided using osteotomes and pituitary rongeurs.  The intramedullary canal of the tibia was reamed in a sequential fashion up to a 12 mm diameter in preparation for  a 75 mm along stem.  The intramedullary tibia cutting guide was positioned and a cleanup cut was performed.  The tourniquet was  deflated after initial tourniquet time of 120 minutes.  Next, a 29 mm broach was inserted and the proximal tibia was sized.  Given the amount of bony destruction medially, it was elected to proceed with a thick tray.  A trial construct consisting of a size 3 MBT revision tray, a 29 mm metaphyseal sleeve, and a 75 mm x 12 mm stem was inserted.  Next, the 15 mm insert was placed and trial reduction was performed with first a 15 and then a 17.5 mm polyethylene trial.  Full extension was achieved and excellent flexion was noted.  Next, attention was directed to the patella.  A TPS saw was used to interrupt the bone cement interface and the patellar component was removed without difficulty.  Residual cement was debrided using curettes and rongeurs.  A cleanup cut was performed using a sagittal saw.  A 38 mm template was placed over the patella and the lug holes were drilled.  The right lower extremity was exsanguinated again using an Esmarch and the tourniquet was reinflated to 300 mmHg.  Trial components were removed.  The cut surfaces of bone were irrigated with copious amounts normal saline with antibiotic solution using pulsatile lavage and suctioned dry. Polymethylmethacrylate cement with gentamicin was prepared in the usual fashion using a vacuum mixer. Cement was applied to the cut surface of the proximal tibia as well as along the undersurface of a size 3 15 mm MBT revision thick tray rotating platform tibial component with a 29 mm porous-coated sleeve and a 75 mm x 12 mm universal fluted stem. Tibial component was positioned and impacted into place. Excess cement was removed using [employer] fast streamer.  A 17.5 mm polyethylene trial was inserted and the knee was brought into full extension with steady axial compression applied. Finally, cement was applied to the backside of a 38 mm dome patella and the patellar component was positioned and patellar clamp applied. Excess cement was removed using [employer] fast streamer.  After adequate curing of the cement, the tourniquet was deflated after a second tourniquet time of 51 minutes. Hemostasis was achieved using electrocautery. The knee was irrigated with copious amounts of normal saline using pulsatile lavage followed by 350 ml of Prontosan and then suctioned dry. 20 mL of 1.3% Exparel and 60 mL of 0.25% Marcaine in 40 mL of normal saline was injected along the posterior capsule, medial and lateral gutters, and along the arthrotomy site. A 17.5 mm stabilized rotating platform polyethylene insert was inserted and the knee was placed through a range of motion with excellent mediolateral soft tissue balancing appreciated and excellent patellar tracking noted. 2 medium drains were placed in the wound bed and brought out through separate stab incisions. The medial parapatellar portion of the incision was reapproximated using interrupted sutures of #1 Vicryl. Subcutaneous tissue was approximated in layers using first #0 Vicryl followed #2-0 Vicryl. The skin was approximated with skin staples. A sterile dressing was applied.  The patient tolerated the procedure well and was transported to the recovery room in stable condition.    Narciso Stoutenburg P. Holley Bouche., M.D.

## 2021-03-10 NOTE — H&P (Signed)
The patient has been re-examined, and the chart reviewed, and there have been no interval changes to the documented history and physical.    The risks, benefits, and alternatives have been discussed at length. The patient expressed understanding of the risks benefits and agreed with plans for surgical intervention.  Trevan Messman P. Siler Mavis, Jr. M.D.    

## 2021-03-10 NOTE — Progress Notes (Signed)
C/o chest pain upon arrival.  Anesthesia notified.12 lead Ekg done and medicated with morphine and fentanyl.  No change in ekg noted by Dr Erenest Rasher. Labs drawn at [facility].

## 2021-03-11 LAB — SURGICAL PATHOLOGY

## 2021-03-11 NOTE — Progress Notes (Signed)
°  Subjective: 1 Day Post-Op Procedure(s) (LRB): TOTAL KNEE REVISION (Right) Patient reports pain as 6 on 0-10 scale.   Patient is well, and has had no acute complaints or problems Plan is to go Home after hospital stay. Negative for chest pain and shortness of breath Fever: no Gastrointestinal: negative for nausea and vomiting.   Patient has not had a bowel movement.  Objective: Vital signs in last 24 hours: Temp:  [97 F (36.1 C)-98.9 F (37.2 C)] 97.4 F (36.3 C) (01/10 0628) Pulse Rate:  [64-94] 71 (01/10 0628) Resp:  [6-38] 19 (01/10 0628) BP: (80-128)/(48-94) 113/61 (01/10 0628) SpO2:  [93 %-99 %] 94 % (01/10 0628) Weight:  [104.3 kg] 104.3 kg (01/09 0946)  Intake/Output from previous day:  Intake/Output Summary (Last 24 hours) at 03/11/2021 0839 Last data filed at 03/11/2021 0636 Gross per 24 hour  Intake 4285.27 ml  Output 1150 ml  Net 3135.27 ml    Intake/Output this shift: No intake/output data recorded.  Labs: Recent Labs    03/10/21 1919  HGB 11.3*   Recent Labs    03/10/21 1919  WBC 6.9  RBC 3.38*  HCT 30.8*  PLT 145*   Recent Labs    03/10/21 1919  NA 122*  K 4.9  CL 94*  CO2 21*  BUN 16  CREATININE 0.93  GLUCOSE 175*  CALCIUM 7.9*   No results for input(s): LABPT, INR in the last 72 hours.   EXAM General - Patient is Alert, Appropriate, and Oriented Extremity - Neurovascular intact Dorsiflexion/Plantar flexion intact Compartment soft Dressing/Incision -Postoperative dressing remains in place., Polar Care in place and working. , Hemovac in place.  Motor Function - intact, moving foot and toes well on exam. Able to perform independent SLR.  Cardiovascular- Regular rate and rhythm, no murmurs/rubs/gallops Respiratory- Lungs clear to auscultation bilaterally Gastrointestinal- soft, nontender, and active bowel sounds   Assessment/Plan: 1 Day Post-Op Procedure(s) (LRB): TOTAL KNEE REVISION (Right) Principal Problem:   History of  revision of total knee arthroplasty  Estimated body mass index is 36.02 kg/m as calculated from the following:   Height as of this encounter: 5\' 7"  (1.702 m).   Weight as of this encounter: 104.3 kg. Advance diet Up with therapy  Likely d/c this PM.      DVT Prophylaxis - Lovenox, Ted hose, and SCDs Weight-Bearing as tolerated to right leg  Cassell Smiles, PA-C [facility] 03/11/2021, 8:39 AM

## 2021-03-11 NOTE — Evaluation (Signed)
Physical Therapy Evaluation Patient Details Name: Johnny Mccoy MRN: [id number] DOB: [birth year] Today's Date: 03/11/2021  History of Present Illness  Pt is a 76 y.o. male s/p R TKA revision. PMH includes: CHF, CAD, hx of CVA, essential tremor, HLD, HTN.  Clinical Impression  Pt admitted with above diagnosis. Pt received in bed agreeable to PT. Pt reports 6/10 pain in R knee despite pain medications and reports being "loopy". Able to report home lay out, DME, PLOF, assist that can be provided at home without difficulty. Reports at baseline being independent with ambulation with intermittent use of SPC. With ADL's pt has required assistance with donning socks, picking items up from ground, and getting pants on due to limited R knee AROM, and pain. Tolerating supine therex with good understanding of form/technique with ability to SLR without quad lag. Today pt is mod-I with bed mobility to reach seated EOB with HOB elevated with no use of bed rails required. Able to stand with minguard and safe hand placement to RW. Initially unsteady with RW in standing with lateral tipping of RW but as time progresses pt steadies himself without external support. Progressed ambulation today within room ~25' with consistent RLE heel strike but does rely on RW for support and antalgic gait but safe use of RW with transfers and turns requiring minguard throughout. Pt placed in recliner with safe hand use all needs in reach. Pt presenting with deficits in ambulation tolerance, pain, strength/AROM of R knee thus will benefit from [facility] PT services. Anticipate pt will be safe to d/c home pending progressive ambulation and stair training. Will f/u this afternoon for BID session for stair training. Pt currently with functional limitations due to the deficits listed below (see PT Problem List). Pt will benefit from skilled PT to increase their independence and safety with mobility to allow discharge to the venue listed below.       Recommendations for follow up therapy are one component of a multi-disciplinary discharge planning process, led by the attending physician.  Recommendations may be updated based on patient status, additional functional criteria and insurance authorization.  Follow Up Recommendations Home health PT    Assistance Recommended at Discharge Set up Supervision/Assistance  Patient can return home with the following  A little help with walking and/or transfers;A little help with bathing/dressing/bathroom;Assistance with cooking/housework;Assist for transportation;Help with stairs or ramp for entrance    Equipment Recommendations Rolling walker (2 wheels);BSC/3in1  Recommendations for Other Services       Functional Status Assessment Patient has had a recent decline in their functional status and demonstrates the ability to make significant improvements in function in a reasonable and predictable amount of time.     Precautions / Restrictions Precautions Precautions: Knee Precaution Booklet Issued: No Restrictions Weight Bearing Restrictions: Yes RLE Weight Bearing: Weight bearing as tolerated      Mobility  Bed Mobility Overal bed mobility: Modified Independent             General bed mobility comments: HOB elevated Patient Response: Cooperative  Transfers Overall transfer level: Needs assistance Equipment used: Rolling walker (2 wheels) Transfers: Sit to/from Stand Sit to Stand: Min guard           General transfer comment: Safe hand placement with standing to RW.    Ambulation/Gait Ambulation/Gait assistance: Min guard Gait Distance (Feet): 25 Feet Assistive device: Rolling walker (2 wheels) Gait Pattern/deviations: Step-to pattern;Decreased step length - left;Decreased stance time - right;Antalgic       General  Gait Details: Consistent heel strike with RLE.  Stairs            Wheelchair Mobility    Modified Rankin (Stroke Patients Only)        Balance Overall balance assessment: Needs assistance Sitting-balance support: Feet supported;No upper extremity supported Sitting balance-Leahy Scale: Fair     Standing balance support: Bilateral upper extremity supported;Reliant on assistive device for balance Standing balance-Leahy Scale: Fair Standing balance comment: Initially unsteady standing at RW. As time progress pt becomes more steady without rocking of RW.                             Pertinent Vitals/Pain Pain Assessment: 0-10 Pain Score: 6  Pain Descriptors / Indicators: Aching;Burning;Discomfort;Grimacing Pain Intervention(s): Limited activity within patient's tolerance;Monitored during session;Premedicated before session;Repositioned;Ice applied    Home Living Family/patient expects to be discharged to:: Private residence Living Arrangements: Spouse/significant other Available Help at Discharge: Available 24 hours/day;Family Type of Home: House Home Access: Stairs to enter Entrance Stairs-Rails: Right Entrance Stairs-Number of Steps: 4 Alternate Level Stairs-Number of Steps: 1 into bedroom Home Layout: Multi-level;Able to live on main level with bedroom/bathroom Home Equipment: Rollator (4 wheels);Shower seat - built Clinical cytogeneticist - single point;Wheelchair - manual      Prior Function Prior Level of Function : Independent/Modified Independent             Mobility Comments: Initial need for SPC but after losing weight he has not needed an AD. ADLs Comments: Donning socks, getting pants on, getting help picking up an item on floor due to knee pain and lack of mobility.     Hand Dominance        Extremity/Trunk Assessment   Upper Extremity Assessment Upper Extremity Assessment: Overall WFL for tasks assessed;Defer to OT evaluation    Lower Extremity Assessment Lower Extremity Assessment: Generalized weakness;RLE deficits/detail RLE Deficits / Details: R TKA RLE Sensation: WNL     Cervical / Trunk Assessment Cervical / Trunk Assessment: Normal  Communication      Cognition Arousal/Alertness: Awake/alert Behavior During Therapy: WFL for tasks assessed/performed Overall Cognitive Status: Within Functional Limits for tasks assessed                                          General Comments      Exercises Total Joint Exercises Ankle Circles/Pumps: AROM;Supine;Both;15 reps Quad Sets: AROM;Strengthening;Right;10 reps;Supine Heel Slides: AROM;Supine;Right;5 reps Hip ABduction/ADduction: AROM;Supine;Right;10 reps Straight Leg Raises: AROM;Supine;Right;10 reps Goniometric ROM: 7-61 Marching in Standing: AROM;Both;5 reps;Standing Other Exercises Other Exercises: Role of PT in acute setting, d/c recs, DME recs, HEP.   Assessment/Plan    PT Assessment Patient needs continued PT services  PT Problem List Decreased strength;Pain;Decreased range of motion;Decreased activity tolerance;Decreased balance;Decreased mobility       PT Treatment Interventions DME instruction;Therapeutic exercise;Gait training;Balance training;Stair training;Neuromuscular re-education;Functional mobility training;Therapeutic activities;Patient/family education    PT Goals (Current goals can be found in the Care Plan section)  Acute Rehab PT Goals Patient Stated Goal: To go home PT Goal Formulation: With patient Time For Goal Achievement: 03/25/21 Potential to Achieve Goals: Good    Frequency BID     Co-evaluation               AM-PAC PT "6 Clicks" Mobility  Outcome Measure Help needed turning from your back to your  side while in a flat bed without using bedrails?: None Help needed moving from lying on your back to sitting on the side of a flat bed without using bedrails?: None Help needed moving to and from a bed to a chair (including a wheelchair)?: A Little Help needed standing up from a chair using your arms (e.g., wheelchair or bedside chair)?: A  Little Help needed to walk in hospital room?: A Little Help needed climbing 3-5 steps with a railing? : A Lot 6 Click Score: 19    End of Session Equipment Utilized During Treatment: Gait belt Activity Tolerance: Patient tolerated treatment well Patient left: in chair;with call bell/phone within reach Nurse Communication: Mobility status PT Visit Diagnosis: Unsteadiness on feet (R26.81);Other abnormalities of gait and mobility (R26.89);Muscle weakness (generalized) (M62.81);Pain Pain - Right/Left: Right Pain - part of body: Knee    Time: 9604-5409 PT Time Calculation (min) (ACUTE ONLY): 33 min   Charges:   PT Evaluation $PT Eval Low Complexity: 1 Low PT Treatments $Therapeutic Exercise: 8-22 mins       Auston Halfmann M. Fairly IV, PT, DPT Physical Therapist- [facility]  03/11/2021, 10:27 AM

## 2021-03-11 NOTE — Progress Notes (Signed)
Physical Therapy Treatment Patient Details Name: Johnny Mccoy MRN: [id number] DOB: [birth year] Today's Date: 03/11/2021   History of Present Illness Pt is a 76 y.o. male s/p R TKA revision. PMH includes: CHF, CAD, hx of CVA, essential tremor, HLD, HTN.    PT Comments    Pt received upright in recliner. Agreeable to afternoon PT treatment session. Spouse present. PT educated spouse and pt on HEP packet with exercises, frequency, reps, sets. Pt with difficulty keeping eyes open during communication. RN and spouse stated pt received Dilaudid prior to PT.  Deferred stair training today due to current state, but pt able to perform HEP in entirety with intermittent TC/VC's for form/technique. Pt able to stand to RW and amb 80' with minguard with min cuing to reduce gait speed, obstacle navigation, and sequencing LE's with RW with turns. Anticipate this need is due to medication as pt did not have this difficulty in morning session. Is able to begin progressing to step through pattern with education with heavy UE reliance throughout on RW. Pt returned to supine in bed with MinA at LE's with all needs in reach. Will plan for stair training in morning session before d/c back to home environment. D/c recs remain appropriate pending stair navigation.   Recommendations for follow up therapy are one component of a multi-disciplinary discharge planning process, led by the attending physician.  Recommendations may be updated based on patient status, additional functional criteria and insurance authorization.  Follow Up Recommendations  Home health PT     Assistance Recommended at Discharge Set up Supervision/Assistance  Patient can return home with the following A little help with walking and/or transfers;A little help with bathing/dressing/bathroom;Assistance with cooking/housework;Assist for transportation;Help with stairs or ramp for entrance   Equipment Recommendations  Rolling walker (2 wheels);BSC/3in1     Recommendations for Other Services       Precautions / Restrictions Precautions Precautions: Knee Precaution Booklet Issued: Yes (comment) Restrictions Weight Bearing Restrictions: Yes RLE Weight Bearing: Weight bearing as tolerated     Mobility  Bed Mobility Overal bed mobility: Needs Assistance Bed Mobility: Sit to Supine       Sit to supine: Min assist   General bed mobility comments: NT, up in recliner. minA at LE's returning to bed. Patient Response: Cooperative  Transfers Overall transfer level: Needs assistance Equipment used: Rolling walker (2 wheels) Transfers: Sit to/from Stand Sit to Stand: Min guard           General transfer comment: Safe hand placement with standing to RW.    Ambulation/Gait Ambulation/Gait assistance: Min guard Gait Distance (Feet): 80 Feet Assistive device: Rolling walker (2 wheels) Gait Pattern/deviations: Step-to pattern;Decreased step length - left;Decreased stance time - right;Antalgic       General Gait Details: Able to initiate step through pattern with cuing but deos increased antalgic gait. Cuing for sequencing LE's and RW with turns.   Stairs             Wheelchair Mobility    Modified Rankin (Stroke Patients Only)       Balance Overall balance assessment: Needs assistance Sitting-balance support: Feet supported;No upper extremity supported Sitting balance-Leahy Scale: Good     Standing balance support: Bilateral upper extremity supported;Reliant on assistive device for balance Standing balance-Leahy Scale: Fair Standing balance comment: No unsteadiness in afternoon session with standing at RW.  Cognition Arousal/Alertness: Awake/alert;Suspect due to medications Behavior During Therapy: [facility] for tasks assessed/performed Overall Cognitive Status: Within Functional Limits for tasks assessed                                 General Comments:  Difficulty keeping eyes open throughout session. Requires occasional redirection and cuing for therex and ambulation.        Exercises Total Joint Exercises Ankle Circles/Pumps: AROM;Supine;Both;15 reps Quad Sets: AROM;Strengthening;Right;Supine;15 reps Short Arc Quad: AROM;Strengthening;Right;15 reps;Seated Heel Slides: AROM;Right;15 reps;Seated Hip ABduction/ADduction: AROM;Supine;Right;15 reps Straight Leg Raises: AROM;Supine;Right;15 reps Other Exercises Other Exercises: Safe turning with RW. HEP packet, frequency, reps, sets. Educated spouse on [place] precautions. Other Exercises: Pt/spouse educated in home/routines modifications, AE/DME for ADL, falls prevention strategies, polar care mgt, and compression stocking mgt. Handout provided    General Comments        Pertinent Vitals/Pain Pain Assessment: 0-10 Pain Score: 4  Pain Descriptors / Indicators: Aching;Discomfort Pain Intervention(s): Limited activity within patient's tolerance;Monitored during session;Repositioned;Premedicated before session    Home Living Family/patient expects to be discharged to:: Private residence Living Arrangements: Spouse/significant other Available Help at Discharge: Available 24 hours/day;Family Type of Home: House Home Access: Stairs to enter Entrance Stairs-Rails: Right Entrance Stairs-Number of Steps: 4 Alternate Level Stairs-Number of Steps: 1 into bedroom Home Layout: Multi-level;Able to live on main level with bedroom/bathroom Home Equipment: Rollator (4 wheels);Shower seat - built Clinical cytogeneticist - single point;Wheelchair - manual      Prior Function            PT Goals (current goals can now be found in the care plan section) Acute Rehab PT Goals Patient Stated Goal: To go home PT Goal Formulation: With patient Time For Goal Achievement: 03/25/21 Potential to Achieve Goals: Good Progress towards PT goals: Progressing toward goals    Frequency    BID       PT Plan Current plan remains appropriate    Co-evaluation              AM-PAC PT "6 Clicks" Mobility   Outcome Measure  Help needed turning from your back to your side while in a flat bed without using bedrails?: None Help needed moving from lying on your back to sitting on the side of a flat bed without using bedrails?: None Help needed moving to and from a bed to a chair (including a wheelchair)?: A Little Help needed standing up from a chair using your arms (e.g., wheelchair or bedside chair)?: A Little Help needed to walk in hospital room?: A Little Help needed climbing 3-5 steps with a railing? : A Lot 6 Click Score: 19    End of Session Equipment Utilized During Treatment: Gait belt Activity Tolerance: Patient tolerated treatment well;Treatment limited secondary to medical complications (Comment) (pain medication limiting safe stair training.) Patient left: in bed;with family/visitor present;with call bell/phone within reach;with bed alarm set Nurse Communication: Mobility status PT Visit Diagnosis: Unsteadiness on feet (R26.81);Other abnormalities of gait and mobility (R26.89);Muscle weakness (generalized) (M62.81);Pain Pain - Right/Left: Right Pain - part of body: Knee     Time: 1300-1340 PT Time Calculation (min) (ACUTE ONLY): 40 min  Charges:  $Gait Training: 8-22 mins $Therapeutic Exercise: 23-37 mins                     Alveria Mcglaughlin M. Fairly IV, PT, DPT Physical Therapist- [facility]  03/11/2021, 2:26 PM

## 2021-03-11 NOTE — Evaluation (Signed)
Occupational Therapy Evaluation Patient Details Name: Johnny Mccoy MRN: [id number] DOB: September 11, 1945 Today's Date: 03/11/2021   History of Present Illness Pt is a 76 y.o. male s/p R TKA revision. PMH includes: CHF, CAD, hx of CVA, essential tremor, HLD, HTN.   Clinical Impression   Pt seen for OT evaluation this date, POD#1 from above surgery. Pt was independent in all ADL prior to surgery, however occasionally using SPC due to R knee pain. Pt is eager to return to PLOF with less pain and improved safety and independence. Pt currently requires minimal assist for LB dressing and bathing while in seated position due to pain and limited AROM of R knee. Pt instructed in polar care mgt, falls prevention strategies, home/routines modifications, DME/AE for LB bathing and dressing tasks, and compression stocking mgt. Spouse entered near end of session. Additional instruction provided for pt/spouse to support recall/carryover as well as handout provided. Pt/spouse verbalized understanding. Pt would benefit from skilled OT services including additional instruction in dressing techniques with or without assistive devices for dressing and bathing skills to support recall and carryover prior to discharge and ultimately to maximize safety, independence, and minimize falls risk and caregiver burden. Do not currently anticipate any OT needs following this hospitalization.      Recommendations for follow up therapy are one component of a multi-disciplinary discharge planning process, led by the attending physician.  Recommendations may be updated based on patient status, additional functional criteria and insurance authorization.   Follow Up Recommendations  No OT follow up    Assistance Recommended at Discharge PRN  Patient can return home with the following A little help with walking and/or transfers;A little help with bathing/dressing/bathroom;Assistance with cooking/housework;Help with stairs or ramp for  entrance;Assist for transportation    Functional Status Assessment  Patient has had a recent decline in their functional status and demonstrates the ability to make significant improvements in function in a reasonable and predictable amount of time.  Equipment Recommendations  BSC/3in1    Recommendations for Other Services       Precautions / Restrictions Precautions Precautions: Knee Precaution Booklet Issued: Yes (comment) Restrictions Weight Bearing Restrictions: Yes RLE Weight Bearing: Weight bearing as tolerated      Mobility Bed Mobility              General bed mobility comments: NT, up in recliner    Transfers Overall transfer level: Needs assistance Equipment used: Rolling walker (2 wheels) Transfers: Sit to/from Stand Sit to Stand: Min guard           General transfer comment: Safe hand placement with standing to RW.      Balance Overall balance assessment: Needs assistance Sitting-balance support: Feet supported;No upper extremity supported Sitting balance-Leahy Scale: Fair     Standing balance support: Bilateral upper extremity supported;Reliant on assistive device for balance Standing balance-Leahy Scale: Fair Standing balance comment: Initially unsteady standing at RW. As time progress pt becomes more steady without rocking of RW.                           ADL either performed or assessed with clinical judgement   ADL Overall ADL's : Needs assistance/impaired                                       General ADL Comments: Pt currently requires MIN A for  LB bathing and dressing tasks and CGA for ADL transfers 2/2 decreased strength/ROM and pain in R knee. Spouse able to provide needed level of assist.     Vision         Perception     Praxis      Pertinent Vitals/Pain Pain Assessment: 0-10 Pain Score: 2  Pain Descriptors / Indicators: Aching;Discomfort Pain Intervention(s): Limited activity within  patient's tolerance;Monitored during session;Premedicated before session;Repositioned;Ice applied     Hand Dominance     Extremity/Trunk Assessment Upper Extremity Assessment Upper Extremity Assessment: Overall WFL for tasks assessed   Lower Extremity Assessment Lower Extremity Assessment: RLE deficits/detail RLE Deficits / Details: R TKA RLE Sensation: WNL   Cervical / Trunk Assessment Cervical / Trunk Assessment: Normal   Communication Communication Communication: HOH   Cognition Arousal/Alertness: Awake/alert;Suspect due to medications Behavior During Therapy: [facility] for tasks assessed/performed Overall Cognitive Status: Within Functional Limits for tasks assessed                                 General Comments: a bit groggy after receiving pain medication     General Comments       Exercises Other Exercises: Pt/spouse educated in home/routines modifications, AE/DME for ADL, falls prevention strategies, polar care mgt, and compression stocking mgt. Handout provided   Shoulder Instructions      Home Living Family/patient expects to be discharged to:: Private residence Living Arrangements: Spouse/significant other Available Help at Discharge: Available 24 hours/day;Family Type of Home: House Home Access: Stairs to enter [employer] of Steps: 4 Entrance Stairs-Rails: Right Home Layout: Multi-level;Able to live on main level with bedroom/bathroom Alternate Level Stairs-Number of Steps: 1 into bedroom   Bathroom Shower/Tub: Occupational psychologist: Handicapped height Bathroom Accessibility: Yes   Home Equipment: Rollator (4 wheels);Shower seat - built Clinical cytogeneticist - single point;Wheelchair - manual          Prior Functioning/Environment Prior Level of Function : Independent/Modified Independent             Mobility Comments: Initial need for SPC but after losing weight he has not needed an AD. ADLs Comments:  Donning socks, getting pants on, getting help picking up an item on floor due to knee pain and lack of mobility.        OT Problem List: Decreased strength;Decreased range of motion;Pain;Impaired balance (sitting and/or standing);Decreased knowledge of use of DME or AE      OT Treatment/Interventions: Self-care/ADL training;Therapeutic exercise;Therapeutic activities;DME and/or AE instruction;Patient/family education;Balance training    OT Goals(Current goals can be found in the care plan section) Acute Rehab OT Goals Patient Stated Goal: go home OT Goal Formulation: With patient/family Time For Goal Achievement: 03/25/21 Potential to Achieve Goals: Good ADL Goals Pt Will Perform Lower Body Dressing: with caregiver independent in assisting;with modified independence Pt Will Transfer to Toilet: with modified independence;ambulating (elevated commode, LRAD PRN) Additional ADL Goal #1: Pt will independently instruct family/caregiver in polar care mgt Additional ADL Goal #2: Pt will independently instruct family/caregiver in compression stocking mgt  OT Frequency: Min 2X/week    Co-evaluation              AM-PAC OT "6 Clicks" Daily Activity     Outcome Measure Help from another person eating meals?: None Help from another person taking care of personal grooming?: None Help from another person toileting, which includes using toliet, bedpan, or urinal?: A  Little Help from another person bathing (including washing, rinsing, drying)?: A Little Help from another person to put on and taking off regular upper body clothing?: None Help from another person to put on and taking off regular lower body clothing?: A Little 6 Click Score: 21   End of Session    Activity Tolerance: Patient tolerated treatment well Patient left: in chair;with call bell/phone within reach;with chair alarm set;with family/visitor present;Other (comment) (polar care inplace)  OT Visit Diagnosis: Other  abnormalities of gait and mobility (R26.89);Pain Pain - Right/Left: Right Pain - part of body: Knee                Time: 6168-3729 OT Time Calculation (min): 34 min Charges:  OT General Charges $OT Visit: 1 Visit OT Evaluation $OT Eval Moderate Complexity: 1 Mod OT Treatments $Self Care/Home Management : 23-37 mins  Ardeth Perfect., MPH, MS, OTR/L ascom [phone] 03/11/21, 1:04 PM

## 2021-03-11 NOTE — Plan of Care (Signed)

## 2021-03-11 NOTE — Progress Notes (Signed)
Met with the patient at the bedside to discuss DC plan and needs He lives at home with his wife His wife provides transportation He can afford his medication He is set up with [facility] PT He needs a RW and a 3 in 1, he has a Corporate investment banker and a standard walker, [employer] will deliver to the room prior to DC TOC to continue to monitor for needs

## 2021-03-12 ENCOUNTER — Encounter: Payer: Self-pay | Admitting: Orthopedic Surgery

## 2021-03-12 MED ORDER — ENOXAPARIN SODIUM 40 MG/0.4ML IJ SOSY: 40.0000 mg | PREFILLED_SYRINGE | INTRAMUSCULAR | 0 refills | Status: DC

## 2021-03-12 MED ORDER — TRAMADOL HCL 50 MG PO TABS: 50.0000 mg | ORAL_TABLET | ORAL | 0 refills | Status: DC | PRN

## 2021-03-12 MED ORDER — CELECOXIB 200 MG PO CAPS: 200.0000 mg | ORAL_CAPSULE | Freq: Two times a day (BID) | ORAL | 0 refills | Status: DC

## 2021-03-12 MED ORDER — OXYCODONE HCL 5 MG PO TABS: 5.0000 mg | ORAL_TABLET | ORAL | 0 refills | Status: DC | PRN

## 2021-03-12 NOTE — Progress Notes (Signed)
°  Subjective: 2 Days Post-Op Procedure(s) (LRB): TOTAL KNEE REVISION (Right) Patient reports pain as moderate.  Did have dilaudid this AM. Patient is well, but has had some minor complaints of swollen testicles which happens every time he gets a foley, per the patient/.  Plan is to go Home after hospital stay. Negative for chest pain and shortness of breath Fever: no Gastrointestinal: negative for nausea and vomiting.   Patient has not had a bowel movement.  Objective: Vital signs in last 24 hours: Temp:  [97.7 F (36.5 C)-98.6 F (37 C)] 97.7 F (36.5 C) (01/11 0241) Pulse Rate:  [56-66] 59 (01/11 0241) Resp:  [14-17] 14 (01/11 0241) BP: (100-137)/(49-67) 115/67 (01/11 0241) SpO2:  [95 %-97 %] 95 % (01/11 0241)  Intake/Output from previous day:  Intake/Output Summary (Last 24 hours) at 03/12/2021 0709 Last data filed at 03/12/2021 0551 Gross per 24 hour  Intake 1966.16 ml  Output 3050 ml  Net -1083.84 ml    Intake/Output this shift: No intake/output data recorded.  Labs: Recent Labs    03/10/21 1919  HGB 11.3*   Recent Labs    03/10/21 1919  WBC 6.9  RBC 3.38*  HCT 30.8*  PLT 145*   Recent Labs    03/10/21 1919  NA 122*  K 4.9  CL 94*  CO2 21*  BUN 16  CREATININE 0.93  GLUCOSE 175*  CALCIUM 7.9*   No results for input(s): LABPT, INR in the last 72 hours.   EXAM General - Patient is Alert, Appropriate, and Oriented Extremity - Neurovascular intact Dorsiflexion/Plantar flexion intact Compartment soft Dressing/Incision -Postoperative dressing remains in place., Polar Care in place and working. , Hemovac in place. , Following removal of post-op dressing, dressing saturated with blood Motor Function - intact, moving foot and toes well on exam. Able to perform independent SLR.  Cardiovascular- Regular rate and rhythm, no murmurs/rubs/gallops Respiratory- Lungs clear to auscultation bilaterally Gastrointestinal- soft and active bowel sounds, slightly  distended   Assessment/Plan: 2 Days Post-Op Procedure(s) (LRB): TOTAL KNEE REVISION (Right) Principal Problem:   History of revision of total knee arthroplasty  Estimated body mass index is 36.02 kg/m as calculated from the following:   Height as of this encounter: 5\' 7"  (1.702 m).   Weight as of this encounter: 104.3 kg. Advance diet Up with therapy    Post-op dressing removed. , Hemovac removed., Mini compression dressing applied. , and Fresh honeycomb dressing applied.   DVT Prophylaxis - Lovenox, Ted hose, and SCDs Weight-Bearing as tolerated to right leg  Cassell Smiles, PA-C [facility] 03/12/2021, 7:09 AM

## 2021-03-12 NOTE — Progress Notes (Signed)
Physical Therapy Treatment Patient Details Name: Johnny Mccoy MRN: [id number] DOB: [birth year] Today's Date: 03/12/2021   History of Present Illness Pt is a 76 y.o. male s/p R TKA revision. PMH includes: CHF, CAD, hx of CVA, essential tremor, HLD, HTN.    PT Comments    Pt was alert long sitting in bed upon arriving. He uis eager for upcoming DC. Agrees to PT session and remand cooperative throughout. Was able to exit L side of bed, stand, and ambulate without physical assistance. Performed ascending/descending stairs without difficulty or safety concern. Pt demonstrates good ROM. 0-84 degrees AROM. Overall pt is progressing as per POC. Recommend DC home with HHPT to follow. He will continue to benefit from skilled PT to address deficits with ROM, strength, and overall independence with ADLs.      Recommendations for follow up therapy are one component of a multi-disciplinary discharge planning process, led by the attending physician.  Recommendations may be updated based on patient status, additional functional criteria and insurance authorization.  Follow Up Recommendations  Home health PT     Assistance Recommended at Discharge PRN  Patient can return home with the following A little help with walking and/or transfers;A little help with bathing/dressing/bathroom;Assistance with cooking/housework;Assist for transportation;Help with stairs or ramp for entrance   Equipment Recommendations  None recommended by PT (Pt states he has all equipment needs already)       Precautions / Restrictions Precautions Precautions: Knee Precaution Booklet Issued: Yes (comment) Restrictions Weight Bearing Restrictions: Yes RLE Weight Bearing: Weight bearing as tolerated     Mobility  Bed Mobility Overal bed mobility: Modified Independent Bed Mobility: Supine to Sit     Supine to sit: Modified independent (Device/Increase time)     General bed mobility comments: Pt was able to exit bed without  physical assistance    Transfers Overall transfer level: Modified independent Equipment used: Rolling walker (2 wheels) Transfers: Sit to/from Stand Sit to Stand: Modified independent (Device/Increase time)           General transfer comment: no VCs or physical assistance required    Ambulation/Gait Ambulation/Gait assistance: Supervision Gait Distance (Feet): 200 Feet Assistive device: Rolling walker (2 wheels) Gait Pattern/deviations: Step-through pattern Gait velocity: decreased     General Gait Details: no LOB or safety concern with ambulation 200+ ft   Stairs Stairs: Yes Stairs assistance: Supervision Stair Management: No rails;Step to pattern;Backwards;Forwards;Sideways;With walker Number of Stairs: 4 General stair comments: Pt demonstrated safe ability to perform stairs that simulated home environment   Balance Overall balance assessment: Modified Independent      Cognition Arousal/Alertness: Awake/alert Behavior During Therapy: WFL for tasks assessed/performed Overall Cognitive Status: Within Functional Limits for tasks assessed      General Comments: Pt is A and O x 4        Exercises Total Joint Exercises Goniometric ROM: 0-84 degrees        Pertinent Vitals/Pain Pain Assessment: 0-10 Pain Score: 7  Pain Location: knee Pain Descriptors / Indicators: Discomfort Pain Intervention(s): Limited activity within patient's tolerance;Monitored during session;Premedicated before session;Repositioned;Ice applied     PT Goals (current goals can now be found in the care plan section) Acute Rehab PT Goals Patient Stated Goal: To go home Progress towards PT goals: Progressing toward goals    Frequency    BID      PT Plan Current plan remains appropriate       AM-PAC PT "6 Clicks" Mobility   Outcome Measure  Help  needed turning from your back to your side while in a flat bed without using bedrails?: None Help needed moving from lying on your  back to sitting on the side of a flat bed without using bedrails?: None Help needed moving to and from a bed to a chair (including a wheelchair)?: None Help needed standing up from a chair using your arms (e.g., wheelchair or bedside chair)?: A Little Help needed to walk in hospital room?: A Little Help needed climbing 3-5 steps with a railing? : A Little 6 Click Score: 21    End of Session   Activity Tolerance: Patient tolerated treatment well Patient left: in chair;with call bell/phone within reach;with chair alarm set;with nursing/sitter in room Nurse Communication: Mobility status PT Visit Diagnosis: Unsteadiness on feet (R26.81);Other abnormalities of gait and mobility (R26.89);Muscle weakness (generalized) (M62.81);Pain Pain - Right/Left: Right Pain - part of body: Knee     Time: 0742-0808 PT Time Calculation (min) (ACUTE ONLY): 26 min  Charges:  $Gait Training: 23-37 mins                    Julaine Fusi PTA 03/12/21, 8:21 AM

## 2021-03-12 NOTE — Plan of Care (Signed)

## 2021-03-12 NOTE — Discharge Summary (Signed)
Physician Discharge Summary  Patient ID: Johnny Mccoy MRN: [id number] DOB/AGE: [birth year] 75 y.o.  Admit date: 03/10/2021 Discharge date: 03/12/2021  Admission Diagnoses:  History of revision of total knee arthroplasty [Z96.659]  Surgeries:Procedure(s):  Right knee revision arthroplasty (tibial and patellar components)   SURGEON:  Marciano Sequin. M.D.   ASSISTANT: Cassell Smiles, PA-C (present and scrubbed throughout the case, critical for assistance with exposure, retraction, instrumentation, and closure)   ANESTHESIA: spinal   ESTIMATED BLOOD LOSS: 150 mL   FLUIDS REPLACED: 2000 mL of crystalloid   TOURNIQUET TIME: #1-120 minutes           #2-31 minutes   DRAINS: 2 medium Hemovac drains   IMPLANTS UTILIZED: DePuy size 3 15 mm MBT revision thick tray, 29 mm MBT revision metaphyseal sleeve (porous-coated), 75 mm x 12 mm universal fluted stem, 17.5 mm stabilized rotating platform polyethylene insert, and a 38 mm oval dome patella.  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 01/15/2021   Loose right total knee arthroplasty ([place]) 01/12/2021   Impairment of balance 01/03/2021   Muscle rigidity 01/03/2021   Arthritis 07/01/2020   Cataracts, bilateral 07/01/2020   Pneumonia, aspiration ([place]) 07/01/2020   Shingles 07/01/2020   AKI (acute kidney injury) ([place]) 06/10/2020   Hyponatremia 06/10/2020   History of seizures 23/30/0762   Acute diastolic heart failure due to valvular disease ([place]) 06/06/2020   Pedal edema 06/06/2020   At risk for delirium 08/29/2019   Angina pectoris ([place]) 03/29/2019   Idiopathic peripheral neuropathy 11/02/2018   Labyrinthine disorder 10/21/2018   Diastasis of rectus abdominis 03/18/2018   Diplopia 07/21/2017   Myopia of both eyes with astigmatism 07/21/2017   Nuclear senile cataract, bilateral 07/21/2017   Mild aortic  valve stenosis 06/29/2017   History of pulmonary embolus (PE) 11/30/2016   Myopia 09/25/2016   Divergence insufficiency 09/25/2016   Impacted cerumen 09/25/2016   Meibomian gland dysfunction 09/25/2016   Presbyopia 09/25/2016   Tear film insufficiency 09/25/2016   Mild cognitive impairment 09/11/2016   Obesity (BMI 30-39.9) 09/11/2016   Spondylolisthesis of lumbar region 09/11/2016   Thrombocytopenia ([place]) 09/11/2016   Other amnesia 09/09/2016   Seizure ([place]) 09/08/2016   Tremor 09/08/2016   Pain syndrome, chronic 08/10/2016   Acute respiratory failure with hypoxemia ([place]) 07/20/2016   Delirium 07/20/2016   Hypokalemia 07/20/2016   Asthma without status asthmaticus 07/20/2016   Left lower lobe pneumonia 07/20/2016   Spinal stenosis 07/20/2016   Lymphedema 12/16/2015   Chronic venous insufficiency 12/16/2015   Leg swelling 12/16/2015   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   Asthenia 12/10/2015   Carpal tunnel syndrome of left wrist 12/09/2015   Left hemiparesis ([place]) 11/05/2015   Skin sensation disturbance 11/05/2015   Mediastinal mass 10/28/2015   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   Anxiety 05/23/2015   Acid reflux 05/23/2015   HBV (hepatitis B virus) infection 05/23/2015   History of cardiac catheterization 05/23/2015   Hyperlipidemia 05/23/2015   CAD (coronary artery disease) 05/23/2015   Anxiety and depression 05/23/2015   Cough 07/26/2014   Essential hypertension 07/16/2014   Breathlessness on exertion 02/12/2014   Valvular heart disease 07/25/2013   History of CVA (cerebrovascular accident) 06/01/2013   Altered mental status 06/21/2012   Bundle branch block, right 05/29/2012   History of knee surgery 05/26/2012   Ophthalmoplegia 01/01/2010    Past Medical History:  Diagnosis  Date   (HFpEF) heart failure with preserved ejection fraction ([place]) 02/27/2014   a.) TTE 02/27/14: EF >55%; mod LVH; mild panvalvular regurgitation; (+) global stress induced HK. b.) TTE 09/20/14: EF 55%; triv  AR/MR, mild TR/PR; mild AS; BAE; c.) TTE 06/20/15: EF 55%, triv AR/MR; BAE; mild AS. d.) TTE 02/11/17: EF 55; significant  RV dilation with mod systolic dysfunction; mild AS. e.) TTE 02/27/19: EF 55%; mild AS. f.) TTE 06/04/20: EF 50%; mild AS   Anxiety    Aortic stenosis 09/20/2014   a.) TTE 09/20/2014: mild; MPG?. b.) TTE 06/20/2015: mild; MPG 8.0 mmHg. c.) TTE 02/11/2017: mild; MPG 12.4 mmHg. d.) TTE 02/27/2019: mild; MPG 11.4 mmHg. e.) TTE 06/04/2020: mild; MPG 9.6 mmHg.   Arthritis    Asthma    CAD (coronary artery disease)    Cataracts, bilateral    Chronic back pain    Chronic venous insufficiency    Complication of anesthesia    a.) postoperative amnesia (1999). b.) seizures after partial knee in 2014; sent to [facility] x 5 days d/t uncontrollable shaking and AMS; ?? fentanyl induced tremors. c.) anesthesia awareness   COVID-19 05/2018   CVA (cerebral vascular accident) [facility])    Depressed    Essential tremor    GERD (gastroesophageal reflux disease)    Hepatitis B    a.) h/o in the 90s; reports not active at present   History of bronchitis    Jan-Mar 2017   History of hiatal hernia    Hyperlipidemia    Hypertension    Insomnia    Joint pain    Lymphedema    Mild cognitive impairment    Muscle spasm    in neck.Takes Flexeril as needed   Numbness    right knee,right hand,and toes on left   RBBB    Seizures (HCC)    [place]. takes Lamotrigine daily. Last seizure 6+ yrs ago   Sleep apnea with use of continuous positive airway pressure (CPAP)    VTE (venous thromboembolism)    a.) s/p RIGHT TKA     Transfusion:    Consultants (if any):   Discharged Condition: Improved  Hospital Course: Johnny Mccoy is an 76 y.o. male who was admitted 03/10/2021 with a diagnosis of aseptic loosening of implants and went to the operating room on 03/10/2021 and underwent revision total knee arthroplasty of right knee. The patient received perioperative antibiotics for prophylaxis (see below).  The patient tolerated the procedure well and was transported to PACU in stable condition. After meeting PACU criteria, the patient was subsequently transferred to the Orthopaedics/Rehabilitation unit.   The patient received DVT prophylaxis in the form of early mobilization, Lovenox, Foot Pumps, and SCDs . A sacral pad had been placed and heels were elevated off of the bed with rolled towels in order to protect skin integrity. Foley catheter was discontinued on postoperative day #0. Wound drains were discontinued on postoperative day #2. The surgical incision was healing well without signs of infection.  Physical therapy was initiated postoperatively for transfers, gait training, and strengthening. Occupational therapy was initiated for activities of daily living and evaluation for assisted devices. Rehabilitation goals were reviewed in detail with the patient. The patient made steady progress with physical therapy and physical therapy recommended discharge to Home.   The patient achieved the preliminary goals of this hospitalization and was felt to be medically and orthopaedically appropriate for discharge.  He was given perioperative antibiotics:  Anti-infectives (From admission, onward)  Start     Dose/Rate Route Frequency Ordered Stop   03/10/21 2200  ceFAZolin (ANCEF) IVPB 2g/100 mL premix        2 g 200 mL/hr over 30 Minutes Intravenous Every 6 hours 03/10/21 2057 03/11/21 0523   03/10/21 2056  valACYclovir (VALTREX) tablet 1,000 mg        1,000 mg Oral Daily PRN 03/10/21 2057     03/10/21 0928  ceFAZolin (ANCEF) 2-4 GM/100ML-% IVPB       Note to Pharmacy: Jordan Hawks H: cabinet override      03/10/21 0928 03/10/21 1221   03/10/21 0830  ceFAZolin (ANCEF) IVPB 2g/100 mL premix        2 g 200 mL/hr over 30 Minutes Intravenous On call to O.R. 03/10/21 [id number] 03/10/21 1632     .  Recent vital signs:  Vitals:   03/11/21 2109 03/12/21 0241  BP: 137/67 115/67  Pulse: 65 (!) 59   Resp: 17 14  Temp: 98.2 F (36.8 C) 97.7 F (36.5 C)  SpO2: 96% 95%    Recent laboratory studies:  Recent Labs    03/10/21 1919  WBC 6.9  HGB 11.3*  HCT 30.8*  PLT 145*  K 4.9  CL 94*  CO2 21*  BUN 16  CREATININE 0.93  GLUCOSE 175*  CALCIUM 7.9*    Diagnostic Studies: PERIPHERAL VASCULAR CATHETERIZATION  Result Date: 03/04/2021 See surgical note for result.  DG Knee Right Port  Result Date: 03/10/2021 CLINICAL DATA:  Postop knee replacement EXAM: PORTABLE RIGHT KNEE - 1-2 VIEW COMPARISON:  None. FINDINGS: Status post right knee replacement with intact hardware and normal alignment. Gas in the soft tissues consistent with recent surgery. IMPRESSION: Status post right knee replacement with expected surgical changes Electronically Signed   By: Donavan Foil M.D.   On: 03/10/2021 19:32    Discharge Medications:   Allergies as of 03/12/2021       Reactions   Fentanyl Other (See Comments)   Possible involuntary tremors   Methocarbamol Other (See Comments)   confusion        Medication List     STOP taking these medications    aspirin EC 81 MG tablet   HYDROcodone-acetaminophen 5-325 MG tablet Commonly known as: NORCO/VICODIN       TAKE these medications    albuterol 108 (90 Base) MCG/ACT inhaler Commonly known as: VENTOLIN HFA Inhale 2 puffs into the lungs every 4 (four) hours as needed for wheezing or shortness of breath.   Breo Ellipta 100-25 MCG/ACT Aepb Generic drug: fluticasone furoate-vilanterol Inhale 1 puff into the lungs daily.   budesonide 0.5 MG/2ML nebulizer solution Commonly known as: PULMICORT Take 0.5 mg by nebulization 2 (two) times daily as needed (shortness of breath).   celecoxib 200 MG capsule Commonly known as: CELEBREX Take 1 capsule (200 mg total) by mouth 2 (two) times daily.   cyanocobalamin 1000 MCG/ML injection Commonly known as: (VITAMIN B-12) Inject 1,000 mcg into the muscle every 30 (thirty) days.   diazepam 2 MG  tablet Commonly known as: VALIUM Take 2-4 mg by mouth See admin instructions. Prior to injections   DULoxetine 30 MG capsule Commonly known as: CYMBALTA Take 30 mg by mouth daily.   enoxaparin 40 MG/0.4ML injection Commonly known as: LOVENOX Inject 0.4 mLs (40 mg total) into the skin daily for 14 days.   ergocalciferol 1.25 MG (50000 UT) capsule Commonly known as: VITAMIN D2 Take 50,000 Units by mouth every Tuesday.   gabapentin 400  MG capsule Commonly known as: NEURONTIN Take 1 capsule (400 mg total) by mouth at bedtime.   lamoTRIgine 100 MG tablet Commonly known as: LAMICTAL Take 100 mg by mouth at bedtime.   lansoprazole 30 MG capsule Commonly known as: PREVACID Take 30 mg by mouth in the morning and at bedtime.   LORazepam 0.5 MG tablet Commonly known as: ATIVAN Take 0.5 mg by mouth 2 (two) times daily.   Magnesium 500 MG Caps Take 1 capsule by mouth daily in the afternoon.   memantine 28 MG Cp24 24 hr capsule Commonly known as: NAMENDA XR Take 1 capsule by mouth daily.   metoprolol succinate 50 MG 24 hr tablet Commonly known as: TOPROL-XL Take 50 mg by mouth 2 (two) times daily.   montelukast 10 MG tablet Commonly known as: SINGULAIR Take 10 mg by mouth at bedtime.   naloxone 4 MG/0.1ML Liqd nasal spray kit Commonly known as: NARCAN Place 1 spray into the nose once.   nitroGLYCERIN 0.4 MG/SPRAY spray Commonly known as: NITROLINGUAL Place 2 sprays under the tongue every 5 (five) minutes x 3 doses as needed for chest pain.   nystatin-triamcinolone cream Commonly known as: MYCOLOG II Apply 1 application topically 2 (two) times daily.   olmesartan 40 MG tablet Commonly known as: BENICAR Take 40 mg by mouth daily.   oxyCODONE 5 MG immediate release tablet Commonly known as: Oxy IR/ROXICODONE Take 1 tablet (5 mg total) by mouth every 4 (four) hours as needed for severe pain (pain score 4-6).   Ozempic (0.25 or 0.5 MG/DOSE) 2 MG/1.5ML Sopn Generic  drug: Semaglutide(0.25 or 0.5MG/DOS) Inject 0.5 mg into the skin every Tuesday.   Red Yeast Rice 600 MG Caps Take 600 mg by mouth daily.   Repatha SureClick 623 MG/ML [place] Generic drug: Evolocumab Inject 140 mg into the skin every 14 (fourteen) days.   torsemide 20 MG tablet Commonly known as: DEMADEX Take 10 mg by mouth daily.   traMADol 50 MG tablet Commonly known as: ULTRAM Take 1-2 tablets (50-100 mg total) by mouth every 4 (four) hours as needed for moderate pain.   traZODone 100 MG tablet Commonly known as: DESYREL Take 100 mg by mouth at bedtime.   valACYclovir 1000 MG tablet Commonly known as: VALTREX Take 1,000 mg by mouth daily as needed (Fever blisters).               Durable Medical Equipment  (From admission, onward)           Start     Ordered   03/10/21 2057  DME Walker rolling  Once       Question:  Patient needs a walker to treat with the following condition  Answer:  Total knee replacement status   03/10/21 2057   03/10/21 2057  DME Bedside commode  Once       Question:  Patient needs a bedside commode to treat with the following condition  Answer:  Total knee replacement status   03/10/21 2057            Disposition: Home with home health PT     Follow-up Information     Fausto Skillern, PA-C Follow up on 03/25/2021.   Specialty: Orthopedic Surgery Why: at 1:15PM Contact information: [place] [id number] [phone]         Dereck Leep, MD Follow up on 04/24/2021.   Specialty: Orthopedic Surgery Why: at 2:15PM Contact information:  [place] [id number] [place], PA-C 03/12/2021, 7:11 AM

## 2021-03-12 NOTE — Plan of Care (Signed)
  Problem: Health Behavior/Discharge Planning: Goal: Ability to manage health-related needs will improve Outcome: Progressing   Problem: Clinical Measurements: Goal: Cardiovascular complication will be avoided Outcome: Progressing   Problem: Activity: Goal: Risk for activity intolerance will decrease Outcome: Progressing   Problem: Pain Managment: Goal: General experience of comfort will improve Outcome: Progressing   Problem: Safety: Goal: Ability to remain free from injury will improve Outcome: Progressing   

## 2021-03-16 LAB — AEROBIC/ANAEROBIC CULTURE W GRAM STAIN (SURGICAL/DEEP WOUND)
Culture: NO GROWTH
Culture: NO GROWTH
Culture: NO GROWTH
Gram Stain: NONE SEEN
Gram Stain: NONE SEEN
Gram Stain: NONE SEEN

## 2021-03-25 ENCOUNTER — Encounter: Payer: Self-pay | Admitting: Internal Medicine

## 2021-03-25 ENCOUNTER — Other Ambulatory Visit: Payer: Self-pay

## 2021-03-25 ENCOUNTER — Emergency Department: Payer: Medicare Other

## 2021-03-25 ENCOUNTER — Inpatient Hospital Stay
Admission: EM | Admit: 2021-03-25 | Discharge: 2021-03-28 | DRG: 070 | Disposition: A | Discharge status: Home / Self Care | Payer: Medicare Other | Attending: Internal Medicine | Admitting: Internal Medicine

## 2021-03-25 DIAGNOSIS — G9341 Metabolic encephalopathy: Principal | ICD-10-CM | POA: Diagnosis present

## 2021-03-25 DIAGNOSIS — R410 Disorientation, unspecified: Secondary | ICD-10-CM

## 2021-03-25 DIAGNOSIS — Z885 Allergy status to narcotic agent status: Secondary | ICD-10-CM | POA: Diagnosis not present

## 2021-03-25 DIAGNOSIS — M25561 Pain in right knee: Secondary | ICD-10-CM

## 2021-03-25 DIAGNOSIS — E871 Hypo-osmolality and hyponatremia: Secondary | ICD-10-CM | POA: Diagnosis present

## 2021-03-25 DIAGNOSIS — E785 Hyperlipidemia, unspecified: Secondary | ICD-10-CM | POA: Diagnosis present

## 2021-03-25 DIAGNOSIS — Z7951 Long term (current) use of inhaled steroids: Secondary | ICD-10-CM

## 2021-03-25 DIAGNOSIS — N179 Acute kidney failure, unspecified: Secondary | ICD-10-CM | POA: Diagnosis present

## 2021-03-25 DIAGNOSIS — T402X5A Adverse effect of other opioids, initial encounter: Secondary | ICD-10-CM | POA: Diagnosis present

## 2021-03-25 DIAGNOSIS — I110 Hypertensive heart disease with heart failure: Secondary | ICD-10-CM | POA: Diagnosis present

## 2021-03-25 DIAGNOSIS — F32A Depression, unspecified: Secondary | ICD-10-CM | POA: Diagnosis present

## 2021-03-25 DIAGNOSIS — Z20822 Contact with and (suspected) exposure to covid-19: Secondary | ICD-10-CM | POA: Diagnosis present

## 2021-03-25 DIAGNOSIS — Z6838 Body mass index (BMI) 38.0-38.9, adult: Secondary | ICD-10-CM

## 2021-03-25 DIAGNOSIS — J45909 Unspecified asthma, uncomplicated: Secondary | ICD-10-CM | POA: Diagnosis present

## 2021-03-25 DIAGNOSIS — Z888 Allergy status to other drugs, medicaments and biological substances status: Secondary | ICD-10-CM

## 2021-03-25 DIAGNOSIS — A419 Sepsis, unspecified organism: Secondary | ICD-10-CM | POA: Diagnosis present

## 2021-03-25 DIAGNOSIS — K5900 Constipation, unspecified: Secondary | ICD-10-CM | POA: Diagnosis not present

## 2021-03-25 DIAGNOSIS — Z825 Family history of asthma and other chronic lower respiratory diseases: Secondary | ICD-10-CM

## 2021-03-25 DIAGNOSIS — I5032 Chronic diastolic (congestive) heart failure: Secondary | ICD-10-CM | POA: Diagnosis present

## 2021-03-25 DIAGNOSIS — K219 Gastro-esophageal reflux disease without esophagitis: Secondary | ICD-10-CM | POA: Diagnosis present

## 2021-03-25 DIAGNOSIS — I472 Ventricular tachycardia, unspecified: Secondary | ICD-10-CM | POA: Diagnosis not present

## 2021-03-25 DIAGNOSIS — E669 Obesity, unspecified: Secondary | ICD-10-CM | POA: Diagnosis present

## 2021-03-25 DIAGNOSIS — Z8249 Family history of ischemic heart disease and other diseases of the circulatory system: Secondary | ICD-10-CM

## 2021-03-25 DIAGNOSIS — Z791 Long term (current) use of non-steroidal anti-inflammatories (NSAID): Secondary | ICD-10-CM

## 2021-03-25 DIAGNOSIS — I4901 Ventricular fibrillation: Secondary | ICD-10-CM | POA: Diagnosis not present

## 2021-03-25 DIAGNOSIS — I2699 Other pulmonary embolism without acute cor pulmonale: Secondary | ICD-10-CM | POA: Diagnosis not present

## 2021-03-25 DIAGNOSIS — Z8616 Personal history of COVID-19: Secondary | ICD-10-CM

## 2021-03-25 DIAGNOSIS — F0390 Unspecified dementia without behavioral disturbance: Secondary | ICD-10-CM | POA: Diagnosis present

## 2021-03-25 DIAGNOSIS — Z8673 Personal history of transient ischemic attack (TIA), and cerebral infarction without residual deficits: Secondary | ICD-10-CM

## 2021-03-25 DIAGNOSIS — G40A09 Absence epileptic syndrome, not intractable, without status epilepticus: Secondary | ICD-10-CM | POA: Diagnosis present

## 2021-03-25 DIAGNOSIS — E861 Hypovolemia: Secondary | ICD-10-CM | POA: Diagnosis present

## 2021-03-25 DIAGNOSIS — I2510 Atherosclerotic heart disease of native coronary artery without angina pectoris: Secondary | ICD-10-CM | POA: Diagnosis present

## 2021-03-25 DIAGNOSIS — Z955 Presence of coronary angioplasty implant and graft: Secondary | ICD-10-CM | POA: Diagnosis not present

## 2021-03-25 DIAGNOSIS — Z96651 Presence of right artificial knee joint: Secondary | ICD-10-CM | POA: Diagnosis present

## 2021-03-25 DIAGNOSIS — Z79899 Other long term (current) drug therapy: Secondary | ICD-10-CM

## 2021-03-25 DIAGNOSIS — Z823 Family history of stroke: Secondary | ICD-10-CM

## 2021-03-25 DIAGNOSIS — G4733 Obstructive sleep apnea (adult) (pediatric): Secondary | ICD-10-CM | POA: Diagnosis present

## 2021-03-25 DIAGNOSIS — F419 Anxiety disorder, unspecified: Secondary | ICD-10-CM | POA: Diagnosis present

## 2021-03-25 LAB — CBC
HCT: 32.7 % — ABNORMAL LOW (ref 39.0–52.0)
HCT: 26.8 % — ABNORMAL LOW (ref 39.0–52.0)
Hemoglobin: 11.5 g/dL — ABNORMAL LOW (ref 13.0–17.0)
Hemoglobin: 9.4 g/dL — ABNORMAL LOW (ref 13.0–17.0)
MCH: 33.3 pg (ref 26.0–34.0)
MCH: 33.6 pg (ref 26.0–34.0)
MCHC: 35.2 g/dL (ref 30.0–36.0)
MCHC: 35.1 g/dL (ref 30.0–36.0)
MCV: 94.8 fL (ref 80.0–100.0)
MCV: 95.7 fL (ref 80.0–100.0)
Platelets: 181 10*3/uL (ref 150–400)
Platelets: 129 10*3/uL — ABNORMAL LOW (ref 150–400)
RBC: 3.45 MIL/uL — ABNORMAL LOW (ref 4.22–5.81)
RBC: 2.8 MIL/uL — ABNORMAL LOW (ref 4.22–5.81)
RDW: 13.5 % (ref 11.5–15.5)
RDW: 13.2 % (ref 11.5–15.5)
WBC: 14.1 10*3/uL — ABNORMAL HIGH (ref 4.0–10.5)
WBC: 8.7 10*3/uL (ref 4.0–10.5)
nRBC: 0 % (ref 0.0–0.2)
nRBC: 0 % (ref 0.0–0.2)

## 2021-03-25 LAB — URINALYSIS, ROUTINE W REFLEX MICROSCOPIC
Bacteria, UA: NONE SEEN
Bilirubin Urine: NEGATIVE
Glucose, UA: NEGATIVE mg/dL
Hgb urine dipstick: NEGATIVE
Ketones, ur: NEGATIVE mg/dL
Leukocytes,Ua: NEGATIVE
Nitrite: NEGATIVE
Protein, ur: NEGATIVE mg/dL
Specific Gravity, Urine: 1.01 (ref 1.005–1.030)
Squamous Epithelial / HPF: NONE SEEN (ref 0–5)
pH: 6.5 (ref 5.0–8.0)

## 2021-03-25 LAB — LACTIC ACID, PLASMA
Lactic Acid, Venous: 1.8 mmol/L (ref 0.5–1.9)
Lactic Acid, Venous: 1.1 mmol/L (ref 0.5–1.9)

## 2021-03-25 LAB — COMPREHENSIVE METABOLIC PANEL
ALT: 32 U/L (ref 0–44)
AST: 19 U/L (ref 15–41)
Albumin: 4.1 g/dL (ref 3.5–5.0)
Alkaline Phosphatase: 58 U/L (ref 38–126)
Anion gap: 11 (ref 5–15)
BUN: 22 mg/dL (ref 8–23)
CO2: 27 mmol/L (ref 22–32)
Calcium: 9.3 mg/dL (ref 8.9–10.3)
Chloride: 83 mmol/L — ABNORMAL LOW (ref 98–111)
Creatinine, Ser: 1.64 mg/dL — ABNORMAL HIGH (ref 0.61–1.24)
GFR, Estimated: 44 mL/min — ABNORMAL LOW (ref >60)
Glucose, Bld: 124 mg/dL — ABNORMAL HIGH (ref 70–99)
Potassium: 4.7 mmol/L (ref 3.5–5.1)
Sodium: 121 mmol/L — ABNORMAL LOW (ref 135–145)
Total Bilirubin: 1.2 mg/dL (ref 0.3–1.2)
Total Protein: 7.4 g/dL (ref 6.5–8.1)

## 2021-03-25 LAB — URINE DRUG SCREEN, QUALITATIVE (ARMC ONLY)
Amphetamines, Ur Screen: NOT DETECTED
Barbiturates, Ur Screen: NOT DETECTED
Benzodiazepine, Ur Scrn: NOT DETECTED
Cannabinoid 50 Ng, Ur ~~LOC~~: NOT DETECTED
Cocaine Metabolite,Ur ~~LOC~~: NOT DETECTED
MDMA (Ecstasy)Ur Screen: NOT DETECTED
Methadone Scn, Ur: NOT DETECTED
Opiate, Ur Screen: POSITIVE — AB
Phencyclidine (PCP) Ur S: NOT DETECTED
Tricyclic, Ur Screen: NOT DETECTED

## 2021-03-25 LAB — RESP PANEL BY RT-PCR (FLU A&B, COVID) ARPGX2
Influenza A by PCR: NEGATIVE
Influenza B by PCR: NEGATIVE
SARS Coronavirus 2 by RT PCR: NEGATIVE

## 2021-03-25 LAB — CREATININE, SERUM
Creatinine, Ser: 1.33 mg/dL — ABNORMAL HIGH (ref 0.61–1.24)
GFR, Estimated: 56 mL/min — ABNORMAL LOW (ref >60)

## 2021-03-25 LAB — PROCALCITONIN: Procalcitonin: 0.2 ng/mL

## 2021-03-25 LAB — ETHANOL: Alcohol, Ethyl (B): <10 mg/dL (ref <10)

## 2021-03-25 MED ORDER — SODIUM CHLORIDE 0.9 % IV BOLUS
1000.0000 mL | Freq: Once | INTRAVENOUS | Status: AC
  Administered 2021-03-25: 16:00:00 1000 mL via INTRAVENOUS

## 2021-03-25 MED ORDER — DOCUSATE SODIUM 100 MG PO CAPS
100.0000 mg | ORAL_CAPSULE | Freq: Two times a day (BID) | ORAL | Status: DC | PRN
  Administered 2021-03-26 – 2021-03-27 (×2): 100 mg via ORAL
  Filled 2021-03-25 (×2): qty 1

## 2021-03-25 MED ORDER — AMIODARONE IV BOLUS ONLY 150 MG/100ML
INTRAVENOUS | Status: AC
  Filled 2021-03-25: qty 100

## 2021-03-25 MED ORDER — AMIODARONE LOAD VIA INFUSION
150.0000 mg | Freq: Once | INTRAVENOUS | Status: DC
  Filled 2021-03-25: qty 83.34

## 2021-03-25 MED ORDER — IOHEXOL 300 MG/ML  SOLN
100.0000 mL | Freq: Once | INTRAMUSCULAR | Status: AC | PRN
  Administered 2021-03-25: 20:00:00 80 mL via INTRAVENOUS

## 2021-03-25 MED ORDER — LACTATED RINGERS IV SOLN: INTRAVENOUS | Status: DC

## 2021-03-25 MED ORDER — NALOXONE HCL 2 MG/2ML IJ SOSY
0.4000 mg | PREFILLED_SYRINGE | Freq: Once | INTRAMUSCULAR | Status: AC
  Administered 2021-03-25: 19:00:00 0.4 mg via INTRAVENOUS
  Filled 2021-03-25: qty 2

## 2021-03-25 MED ORDER — SODIUM CHLORIDE 0.9 % IV BOLUS (SEPSIS)
1000.0000 mL | Freq: Once | INTRAVENOUS | Status: AC
  Administered 2021-03-25: 18:00:00 1000 mL via INTRAVENOUS

## 2021-03-25 MED ORDER — AMIODARONE HCL IN DEXTROSE 360-4.14 MG/200ML-% IV SOLN: 60.0000 mg/h | INTRAVENOUS | Status: DC

## 2021-03-25 MED ORDER — NALOXONE HCL 0.4 MG/ML IJ SOLN
0.2000 mg | Freq: Once | INTRAMUSCULAR | Status: AC
  Administered 2021-03-25: 18:00:00 0.2 mg via INTRAVENOUS

## 2021-03-25 MED ORDER — AMIODARONE HCL IN DEXTROSE 360-4.14 MG/200ML-% IV SOLN
INTRAVENOUS | Status: AC
  Administered 2021-03-25: 22:00:00 60 mg/h via INTRAVENOUS
  Filled 2021-03-25: qty 200

## 2021-03-25 MED ORDER — AMIODARONE HCL IN DEXTROSE 360-4.14 MG/200ML-% IV SOLN: 30.0000 mg/h | INTRAVENOUS | Status: DC

## 2021-03-25 MED ORDER — SODIUM CHLORIDE 0.9 % IV BOLUS (SEPSIS)
1000.0000 mL | Freq: Once | INTRAVENOUS | Status: AC
  Administered 2021-03-25: 21:00:00 1000 mL via INTRAVENOUS

## 2021-03-25 MED ORDER — SODIUM CHLORIDE 0.9 % IV SOLN
1.0000 g | Freq: Once | INTRAVENOUS | Status: AC
  Administered 2021-03-25: 18:00:00 1 g via INTRAVENOUS
  Filled 2021-03-25: qty 10

## 2021-03-25 MED ORDER — LORAZEPAM 2 MG/ML IJ SOLN
INTRAMUSCULAR | Status: AC
  Filled 2021-03-25: qty 1

## 2021-03-25 MED ORDER — POLYETHYLENE GLYCOL 3350 17 G PO PACK
17.0000 g | PACK | Freq: Every day | ORAL | Status: DC | PRN
  Administered 2021-03-26 – 2021-03-28 (×3): 17 g via ORAL
  Filled 2021-03-25 (×3): qty 1

## 2021-03-25 MED ORDER — VANCOMYCIN HCL IN DEXTROSE 1-5 GM/200ML-% IV SOLN
1000.0000 mg | Freq: Once | INTRAVENOUS | Status: AC
  Administered 2021-03-25: 18:00:00 1000 mg via INTRAVENOUS
  Filled 2021-03-25: qty 200

## 2021-03-25 NOTE — ED Notes (Signed)
EDP Bradler and NP at bedside.

## 2021-03-25 NOTE — ED Triage Notes (Addendum)
Pt here with AMS after a right knee surgery on 03/10/2021. Wife states that pt was "out of his mind" last night. Pt states that he does not feel good and his is also having right side pain. Pt sent here by ortho.

## 2021-03-25 NOTE — ED Notes (Signed)
Attempted for EKG but finding that 5-lead having trouble due to possible short in line. Will collect another 5-lead from C-pod to try again.

## 2021-03-25 NOTE — ED Notes (Signed)
Pt assisted to use urinal. HOB adjusted for pt.

## 2021-03-25 NOTE — ED Notes (Signed)
Pt somewhat lethargic.

## 2021-03-25 NOTE — ED Notes (Signed)
EDP Bradler to bedside with [place] to assist in placing another IV. IV team consult to be placed soon if necessary.

## 2021-03-25 NOTE — ED Notes (Signed)
Verbal from EDP Bradler to give 1mg  Ativan; once to bedside with ativan EDP Bradler verbal to hold on ativan since pulses noted and pt verbally responding. Ativan remains with this RN if decided it is needed later.

## 2021-03-25 NOTE — Consult Note (Signed)
PHARMACY CONSULT NOTE - FOLLOW UP  Pharmacy Consult for Electrolyte Monitoring and Replacement   Recent Labs: Potassium (mmol/L)  Date Value  03/25/2021 4.7  03/02/2014 4.1   Magnesium (mg/dL)  Date Value  03/10/2021 1.8   Calcium (mg/dL)  Date Value  03/25/2021 9.3   Calcium, Total (mg/dL)  Date Value  03/02/2014 7.9 (L)   Albumin (g/dL)  Date Value  03/25/2021 4.1  04/10/2013 3.0 (L)   Sodium (mmol/L)  Date Value  03/25/2021 121 (L)  03/02/2014 140     Assessment: Patients for AMS. PMH includes HFpEF, CAD, CVA, HTN, HLD, seizures and VTE. Noted recent knee surgery on 02/07/2022. Pharmacy consulted for electrolytes management and renal dose adjustment for CCM  Goal of Therapy:  Electrolytes WNL  Plan:  K within goal range at 4.7 (noted amiodarone infusion started). Na and Cl below desired goal on admission. Patient currently on LR @ 150 ml/hr, will defer additional treatment to provider.   Pharmacy will continue to follow MA labs and replace electrolytes as needed.  Kelsi Benham Rodriguez-Guzman PharmD, BCPS 03/25/2021 10:24 PM

## 2021-03-25 NOTE — ED Notes (Signed)
Pt wrapped in warm blankets; currently laying calmly on stretcher; skin dry; pt verbalized to this RN that he wants "something for anxiety". [place] notified.

## 2021-03-25 NOTE — ED Notes (Signed)
No obvious change since pt given small dose of narcan. Wife remains at bedside.

## 2021-03-25 NOTE — ED Notes (Signed)
Called and notified CT staff that pt's BP has stabilized enough to go to CT now.

## 2021-03-25 NOTE — Progress Notes (Signed)
Elink following code sepsis °

## 2021-03-25 NOTE — ED Notes (Signed)
V-fib noted on monitor. Code chart pulled into room and pt placed on pads. Strip printed. Attempting to print another EKG but having trouble getting lead V3 to read. Pt continues to deny CP and is able to answer other questions. Pt attempts to open eyes when commanded verbally though not able to open significantly. Pulses present at neck and in limbs. EDP Bradler at bedside. Pt continues to shake at times. When asked states is "cold"; given another blanket.

## 2021-03-25 NOTE — ED Notes (Signed)
Pt leaving for CT. Pt more alert and speech easier to understand than earlier though still slurred.

## 2021-03-25 NOTE — ED Notes (Signed)
Artifact noted on cardiac monitor as pt shaking; asked pt is he is cold and pt stated "yes sometimes I get chills". Pt denies CP. Skin dry. Resp reg/unlabored.

## 2021-03-25 NOTE — ED Provider Notes (Signed)
[facility] Provider Note    Event Date/Time   First MD Initiated Contact with Patient 03/25/21 1502     (approximate)   History   Altered Mental Status   HPI NOSSON WENDER is a 76 y.o. male with recent surgery of the right knee on 02/07/2022 by Dr. Marry Guan who presents for altered mental status that began last night.  Patient's wife at bedside and provides most of this history along with videos that she took on her phone showing patient repeating multiple phrases over and over as well as somnolence and echolalia.  Patient remains somnolent but answers questions appropriately stating that he has no energy and feels that his wound has been bleeding more than usual over the the past few days.  Patient does state that he is continuing to get Lovenox injections with the last scheduled for tomorrow.  Patient was sent from orthopedic clinic after a follow-up appointment today for dressing change.  Patient also complains of chills     Physical Exam   Triage Vital Signs: ED Triage Vitals  Enc Vitals Group     BP 03/25/21 1506 (!) 90/54     Pulse Rate 03/25/21 1506 (!) 50     Resp 03/25/21 1506 18     Temp 03/25/21 1506 98.5 F (36.9 C)     Temp Source 03/25/21 1506 Oral     SpO2 03/25/21 1506 99 %     Weight 03/25/21 1505 104 lb 4.8 oz (47.3 kg)     Height 03/25/21 1505 5\' 7"  (1.702 m)     Head Circumference --      Peak Flow --      Pain Score 03/25/21 1504 8     Pain Loc --      Pain Edu? --      Excl. in [place]? --     Most recent vital signs: Vitals:   03/26/21 1220 03/26/21 1613  BP: (!) 109/52 128/76  Pulse: 75 79  Resp: 18 16  Temp: 98.5 F (36.9 C) 98.2 F (36.8 C)  SpO2: 92% 95%    General: In stretcher with eyes closed but arousable to voice only, no distress.  CV:  Good peripheral perfusion.  Resp:  Normal effort.  Abd:  No distention.  Other:  Right knee with dressing in place that has blood soiled with some mild surrounding  erythema.  Conjunctival pallor   ED Results / Procedures / Treatments   Labs (all labs ordered are listed, but only abnormal results are displayed) Labs Reviewed  COMPREHENSIVE METABOLIC PANEL - Abnormal; Notable for the following components:      Result Value   Sodium 121 (*)    Chloride 83 (*)    Glucose, Bld 124 (*)    Creatinine, Ser 1.64 (*)    GFR, Estimated 44 (*)    All other components within normal limits  CBC - Abnormal; Notable for the following components:   WBC 14.1 (*)    RBC 3.45 (*)    Hemoglobin 11.5 (*)    HCT 32.7 (*)    All other components within normal limits  URINALYSIS, ROUTINE W REFLEX MICROSCOPIC - Abnormal; Notable for the following components:   APPearance CLEAR (*)    All other components within normal limits  URINE DRUG SCREEN, QUALITATIVE ([facility] ONLY) - Abnormal; Notable for the following components:   Opiate, Ur Screen POSITIVE (*)    All other components within normal limits  CBC -  Abnormal; Notable for the following components:   RBC 2.80 (*)    Hemoglobin 9.4 (*)    HCT 26.8 (*)    Platelets 129 (*)    All other components within normal limits  CREATININE, SERUM - Abnormal; Notable for the following components:   Creatinine, Ser 1.33 (*)    GFR, Estimated 56 (*)    All other components within normal limits  URINALYSIS, COMPLETE (UACMP) WITH MICROSCOPIC - Abnormal; Notable for the following components:   Specific Gravity, Urine <1.005 (*)    All other components within normal limits  BASIC METABOLIC PANEL - Abnormal; Notable for the following components:   Sodium 126 (*)    Chloride 96 (*)    Glucose, Bld 113 (*)    Calcium 8.0 (*)    All other components within normal limits  CBC - Abnormal; Notable for the following components:   RBC 2.76 (*)    Hemoglobin 9.4 (*)    HCT 26.3 (*)    MCH 34.1 (*)    Platelets 139 (*)    All other components within normal limits  SODIUM - Abnormal; Notable for the following components:   Sodium  127 (*)    All other components within normal limits  SODIUM - Abnormal; Notable for the following components:   Sodium 126 (*)    All other components within normal limits  OSMOLALITY - Abnormal; Notable for the following components:   Osmolality 270 (*)    All other components within normal limits  BRAIN NATRIURETIC PEPTIDE - Abnormal; Notable for the following components:   B Natriuretic Peptide 188.3 (*)    All other components within normal limits  SODIUM - Abnormal; Notable for the following components:   Sodium 124 (*)    All other components within normal limits  RESP PANEL BY RT-PCR (FLU A&B, COVID) [id number]  CULTURE, BLOOD (ROUTINE X 2) W REFLEX TO ID PANEL  CULTURE, BLOOD (ROUTINE X 2)  URINE CULTURE  ETHANOL  LACTIC ACID, PLASMA  LACTIC ACID, PLASMA  LACTIC ACID, PLASMA  PROCALCITONIN  PROCALCITONIN  MAGNESIUM  PHOSPHORUS  URIC ACID  TSH  CORTISOL  SODIUM  SODIUM  SODIUM  PROCALCITONIN  CREATININE, SERUM  SODIUM  CBG MONITORING, ED  TROPONIN I (HIGH SENSITIVITY)     EKG ED ECG REPORT I, Naaman Plummer, the attending physician, personally viewed and interpreted this ECG.  Date: 03/25/2021 EKG Time: 1530 Rate: 50 Rhythm: Bradycardic sinus rhythm QRS Axis: normal Intervals: RBBB ST/T Wave abnormalities: normal Narrative Interpretation: Bradycardic sinus rhythm with right bundle branch block.  No evidence of acute ischemia   RADIOLOGY ED MD interpretation: CT of the right knee with contrast shows joint effusion  CT of the head without contrast shows no evidence of acute abnormalities including no intracerebral hemorrhage, obvious masses, or significant edema  One-view portable chest x-ray shows no evidence of acute abnormalities including no pneumonia, pneumothorax, or widened mediastinum  Agree with radiology assessment  Official radiology report(s): CT KNEE RIGHT W CONTRAST  Result Date: 03/25/2021 CLINICAL DATA:  Status post right knee  replacement.  Redness. EXAM: CT OF THE right KNEE WITH CONTRAST TECHNIQUE: Multidetector CT imaging was performed following the standard protocol during bolus administration of intravenous contrast. RADIATION DOSE REDUCTION: This exam was performed according to the departmental dose-optimization program which includes automated exposure control, adjustment of the mA and/or kV according to patient size and/or use of iterative reconstruction technique. CONTRAST:  52mL OMNIPAQUE IOHEXOL 300 MG/ML  SOLN  COMPARISON:  Right knee radiograph dated 03/10/2021. FINDINGS: Evaluation is limited due to streak artifact caused by right knee arthroplasty. Bones/Joint/Cartilage There is no acute fracture or dislocation. 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 The arthroplasty components appear intact and in anatomic alignment. There is a large joint effusion which may be postoperative. An infectious process is not excluded clinical correlation is recommended. Ligaments Suboptimally assessed by CT. Muscles and Tendons No acute findings. Soft tissues Diffuse subcutaneous edema and postsurgical changes of the skin over the anterior knee. IMPRESSION: 1. Total right knee arthroplasty. 2. Large joint effusion. Clinical correlation is recommended to evaluate for possibility of an infectious process. Electronically Signed   By: Anner Crete M.D.   On: 03/25/2021 20:37      PROCEDURES:  Critical Care performed: Yes, see critical care procedure note(s)  .1-3 Lead EKG Interpretation Performed by: Naaman Plummer, MD Authorized by: Naaman Plummer, MD     Interpretation: normal     ECG rate:  78   ECG rate assessment: normal     Rhythm: sinus rhythm     Ectopy: none     Conduction: normal    CRITICAL CARE Performed by: Naaman Plummer   Total critical care time: 55 minutes  Critical care time was exclusive of separately billable procedures and treating other patients.  Critical care was necessary to treat or  prevent imminent or life-threatening deterioration.  Critical care was time spent personally by me on the following activities: development of treatment plan with patient and/or surrogate as well as nursing, discussions with consultants, evaluation of patient's response to treatment, examination of patient, obtaining history from patient or surrogate, ordering and performing treatments and interventions, ordering and review of laboratory studies, ordering and review of radiographic studies, pulse oximetry and re-evaluation of patient's condition.  MEDICATIONS ORDERED IN ED: Medications  amiodarone (NEXTERONE) 150-4.21 MG/100ML-% bolus (150 mg  Not Given 03/25/21 2150)  docusate sodium (COLACE) capsule 100 mg (100 mg Oral Given 03/26/21 0848)  polyethylene glycol (MIRALAX / GLYCOLAX) packet 17 g (17 g Oral Given 03/26/21 0051)  fluticasone furoate-vilanterol (BREO ELLIPTA) 100-25 MCG/ACT 1 puff (1 puff Inhalation Given 03/26/21 1034)  budesonide (PULMICORT) nebulizer solution 0.5 mg (has no administration in time range)  DULoxetine (CYMBALTA) DR capsule 30 mg (30 mg Oral Given 03/26/21 0849)  lamoTRIgine (LAMICTAL) tablet 100 mg (100 mg Oral Given 03/26/21 0104)  LORazepam (ATIVAN) tablet 0.5 mg (0.5 mg Oral Given 03/26/21 0849)  montelukast (SINGULAIR) tablet 10 mg (10 mg Oral Given 03/26/21 0051)  oxyCODONE (Oxy IR/ROXICODONE) immediate release tablet 5 mg (5 mg Oral Given 03/26/21 1516)  traZODone (DESYREL) tablet 100 mg (100 mg Oral Given 03/26/21 0051)  albuterol (PROVENTIL) (2.5 MG/3ML) 0.083% nebulizer solution 2.5 mg (has no administration in time range)  Chlorhexidine Gluconate Cloth 2 % PADS 6 each (6 each Topical Given 03/26/21 0955)  pantoprazole (PROTONIX) EC tablet 40 mg (40 mg Oral Given 03/26/21 0849)  ceFEPIme (MAXIPIME) 2 g in sodium chloride 0.9 % 100 mL IVPB (2 g Intravenous New Bag/Given 03/26/21 1443)  vancomycin (VANCOREADY) IVPB 1500 mg/300 mL (0 mg Intravenous Stopped 03/26/21 0753)   enoxaparin (LOVENOX) injection 40 mg (40 mg Subcutaneous Given 03/26/21 1832)  sodium chloride 0.9 % bolus 1,000 mL (0 mLs Intravenous Stopped 03/25/21 1713)  cefTRIAXone (ROCEPHIN) 1 g in sodium chloride 0.9 % 100 mL IVPB (0 g Intravenous Stopped 03/25/21 1815)  naloxone (NARCAN) injection 0.2 mg (0.2 mg Intravenous Given 03/25/21 1739)  vancomycin (  VANCOCIN) IVPB 1000 mg/200 mL premix (0 mg Intravenous Stopped 03/25/21 1920)  iohexol (OMNIPAQUE) 300 MG/ML solution 100 mL (80 mLs Intravenous Contrast Given 03/25/21 1950)  sodium chloride 0.9 % bolus 1,000 mL (0 mLs Intravenous Stopped 03/25/21 2211)    And  sodium chloride 0.9 % bolus 1,000 mL (0 mLs Intravenous Stopped 03/25/21 2001)    And  sodium chloride 0.9 % bolus 1,000 mL (0 mLs Intravenous Stopped 03/25/21 1918)  naloxone (NARCAN) injection 0.4 mg (0.4 mg Intravenous Given 03/25/21 1923)  LORazepam (ATIVAN) injection 1 mg (1 mg Intravenous Given 03/26/21 0051)     IMPRESSION / MDM / ASSESSMENT AND PLAN / ED COURSE  I reviewed the triage vital signs and the nursing notes.                              Differential diagnosis includes, but is not limited to, CVA, metabolic encephalopathy, medication side effect, UTI, delirium  The patient is on the cardiac monitor to evaluate for evidence of arrhythmia and/or significant heart rate changes.  Is a 76 year old male who presents with his wife complaining of altered mental status and somnolence.  Patient remained somnolent throughout his emergency department course despite administration of Narcan.  Patient also arrived significantly hypotensive that responded to 30 cc/kg of crystalloid.   Laboratory evaluation significant for leukocytosis to 14.  Hemoglobin stable at 11.5.  Lactic acid negative at 1.8.  CMP significant for sodium of 121 which seems to be baseline for this patient however he does have elevated creatinine of 1.64 that is up from 0.93 on 03/10/2021 concerning for acute kidney injury.   Patient's head CT and chest x-ray did not show any evidence of acute abnormalities however his CT of the knee did show a large joint effusion.  After evaluation in our emergency department and prior to admission, patient had multiple episodes of shaking of the upper and lower extremities without loss of consciousness or decrease in mental status/respiratory status.  After these shaking episodes and occasionally during them patient has runs of arrhythmia looks similar to wide-complex tachycardia.  Patient was bolused amiodarone and started on a drip. Consults: Orthopedics-spoke to Dr. Marry Guan in orthopedic surgery who will be evaluating this patient throughout his admission Critical care-in discussion with the critical care team, patient is excepted to the ICU for further evaluation and management given his decreased mental status that is persistent.  Dispo: Admit to the ICU        FINAL CLINICAL IMPRESSION(S) / ED DIAGNOSES   Final diagnoses:  Delirium  Confusion     Rx / DC Orders   ED Discharge Orders     None        Note:  This document was prepared using Dragon voice recognition software and may include unintentional dictation errors.   Naaman Plummer, MD 03/26/21 2003

## 2021-03-25 NOTE — H&P (Incomplete)
NAME:  Johnny Mccoy, MRN:  [id number], DOB:  [birth year], LOS: 0 ADMISSION DATE:  03/25/2021, CONSULTATION DATE:  *** REFERRING MD:  ***, CHIEF COMPLAINT:  ***    HPI  [age] y.o with significant PMH of/as below who presented to the ED with chief complaints of  ED Course: On arrival to the ED, he was afebrile with blood pressure  mm Hg and pulse rate  beats/min. 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 with Lamictal  Shingles  Sleep apnea does not use CPAP- new diagnosis 06/2017  Stroke (CMS-HCC)  Transient alteration of awareness   Significant Hospital Events   ***  Consults:  Orthopaedic  Procedures:    Significant Diagnostic Tests:  : Chest Xray> : Abdominal xray> : Noncontrast CT head : CTA abdomen and pelvis : CTA Chest  Micro Data:  : SARS-CoV-2 PCR> negative : Influenza PCR> negative : Blood culture x2> : Urine Culture> : MRSA PCR>>  : Strep pneumo urinary antigen> : Legionella urinary antigen> : Mycoplasma pneumonia>  Antimicrobials:   Vancomycin Cefepime Azithromycin Ceftriaxone Metronidazole   OBJECTIVE  Blood pressure (!) 122/58, pulse 75, temperature 98.5 F (36.9 C), temperature source Oral, resp. rate (!) 21, height 5' 7"  (1.702 m), weight 117 kg, SpO2 100 %.        Intake/Output Summary (Last 24 hours) at 03/25/2021 2231 Last data filed at 03/25/2021 2036 Gross per 24 hour  Intake --  Output 525 ml  Net -525 ml   Filed Weights   03/25/21 1505 03/25/21 1745  Weight: 47.3 kg 117 kg     Physical Examination  GENERAL: year-old critically ill patient lying in the bed with no acute distress.  EYES: Pupils equal, round, reactive to light and accommodation. No scleral icterus. Extraocular muscles intact.  HEENT: Head atraumatic, normocephalic. Oropharynx and nasopharynx clear.  NECK:  Supple, no jugular venous distention. No thyroid enlargement, no tenderness.  LUNGS: Normal breath sounds bilaterally, no wheezing, rales,rhonchi or crepitation. No use of accessory muscles of respiration.  CARDIOVASCULAR: S1, S2 normal. No murmurs, rubs, or gallops.  ABDOMEN: Soft, nontender, nondistended. Bowel sounds present. No organomegaly or mass.  EXTREMITIES: No pedal edema, cyanosis, or clubbing.  NEUROLOGIC: Cranial nerves II through XII are intact.  Muscle strength 5/5 in all extremities. Sensation intact. Gait not checked.  PSYCHIATRIC: The patient is alert and oriented x 3.  SKIN: No obvious rash, lesion, or ulcer.   Labs/imaging that I {ACTIONS; HAVE/HAVE NOT:19434}personally reviewed  (right click and "Reselect all SmartList Selections" daily)     Labs   CBC: Recent Labs  Lab 03/25/21 1535  WBC 14.1*  HGB 11.5*  HCT 32.7*  MCV  94.8  PLT 342    Basic Metabolic Panel: Recent Labs  Lab 03/25/21 1535  NA 121*  K 4.7  CL 83*  CO2 27  GLUCOSE 124*  BUN 22  CREATININE 1.64*  CALCIUM 9.3   GFR: Estimated Creatinine Clearance: 48.3 mL/min (A) (by C-G formula based on SCr of 1.64 mg/dL  (H)). Recent Labs  Lab 03/25/21 1535  WBC 14.1*  LATICACIDVEN 1.8    Liver Function Tests: Recent Labs  Lab 03/25/21 1535  AST 19  ALT 32  ALKPHOS 58  BILITOT 1.2  PROT 7.4  ALBUMIN 4.1   No results for input(s): LIPASE, AMYLASE in the last 168 hours. No results for input(s): AMMONIA in the last 168 hours.  ABG    Component Value Date/Time   PHART 7.410 10/29/2015 0425   PCO2ART 51.5 (H) 10/29/2015 0425   PO2ART 70.0 (L) 10/29/2015 0425   HCO3 32.6 (H) 10/29/2015 0425   TCO2 34 10/29/2015 0425   O2SAT 94.0 10/29/2015 0425     Coagulation Profile: No results for input(s): INR, PROTIME in the last 168 hours.  Cardiac Enzymes: No results for input(s): CKTOTAL, CKMB, CKMBINDEX, TROPONINI in the last 168 hours.  HbA1C: No results found for: HGBA1C  CBG: No results for input(s): GLUCAP in the last 168 hours.  Review of Systems:   ***  Past Medical History  He,  has a past medical history of (HFpEF) heart failure with preserved ejection fraction ([place]) (02/27/2014), Anxiety, Aortic stenosis (09/20/2014), Arthritis, Asthma, CAD (coronary artery disease), Cataracts, bilateral, Chronic back pain, Chronic venous insufficiency, Complication of anesthesia, COVID-19 (05/2018), CVA (cerebral vascular accident) ([place]), Depressed, Essential tremor, GERD (gastroesophageal reflux disease), Hepatitis B, History of bronchitis, History of hiatal hernia, Hyperlipidemia, Hypertension, Insomnia, Joint pain, Lymphedema, Mild cognitive impairment, Muscle spasm, Numbness, RBBB, Seizures ([place]), Sleep apnea with use of continuous positive airway pressure (CPAP), and VTE (venous thromboembolism).   Surgical History    Past Surgical History:  Procedure Laterality Date   ANKLE ARTHROSCOPY Left 03/07/2020   Procedure: ANKLE ARTHROSCOPY;  Surgeon: Caroline More, DPM;  Location: [place];  Service: Podiatry;  Laterality: Left;   BACK SURGERY     x3   BLEPHAROPLASTY     CARDIAC  CATHETERIZATION     2015   CARPAL TUNNEL RELEASE     COLONOSCOPY     CORONARY ANGIOPLASTY WITH STENT PLACEMENT     x 3   CORONARY ANGIOPLASTY WITH STENT PLACEMENT Left 01/30/2003   Procedure: CORONARY ANGIOPLASTY WITH STENT PLACEMENT (2.75 x 32 mm Taxus stent to RCA); Location: [facility]; Surgeon: Courtney Paris, MD   dental implants     ESOPHAGOGASTRODUODENOSCOPY     EYE SURGERY     both eyes in 60's d/t muscle causing to squint   IVC FILTER INSERTION N/A 03/04/2021   Procedure: IVC FILTER INSERTION;  Surgeon: Katha Cabal, MD;  Location: [place] [facility];  Service: Cardiovascular;  Laterality: N/A;   JOINT REPLACEMENT Right    knee   KNEE ARTHROSCOPY     LEFT HEART CATH AND CORONARY ANGIOGRAPHY Left 04/12/2019   Procedure: LEFT HEART CATH AND CORONARY ANGIOGRAPHY;  Surgeon: Corey Skains, MD;  Location: [place] [facility];  Service: Cardiovascular;  Laterality: Left;   MEDIAL PARTIAL KNEE REPLACEMENT Right    MEDIASTERNOTOMY N/A 10/28/2015   Procedure: PARTIAL STERNOTOMY;  Surgeon: [name], MD;  Location: [place];  Service: Thoracic;  Laterality: N/A;   numerous hand surgery  ORIF ANKLE FRACTURE Left 03/07/2020   Procedure: OPEN REDUCTION INTERNAL FIXATION (ORIF) ANKLE FRACTURE;  Surgeon: Caroline More, DPM;  Location: [place];  Service: Podiatry;  Laterality: Left;   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   Procedure: RESECTION OF anterier MEDIASTINAL TUMOR;  Surgeon: [name], MD;  Location: [place];  Service: Thoracic;  Laterality: N/A;   right hand fusion     rotorblator  03/1997   anteriograde amnesia resulted   TENDON REPAIR Left 03/07/2020   Procedure: FLEXOR TENDON REPAIR;  Surgeon: Caroline More, DPM;  Location: [place];  Service: Podiatry;  Laterality: Left;  sleep apnea   TONSILLECTOMY     TOTAL KNEE REVISION Right 03/10/2021   Procedure: TOTAL KNEE REVISION;  Surgeon: Dereck Leep, MD;  Location: [facility];   Service: Orthopedics;  Laterality: Right;   UMBILICAL HERNIA REPAIR       Social History   reports that he has never smoked. He has never used smokeless tobacco. He reports current alcohol use of about 14.0 standard drinks per week. He reports that he does not use drugs.   Family History   His family history includes COPD in his father; Cancer (age of onset: 27) in his father; Heart disease in his maternal grandfather and mother; Hyperlipidemia in his maternal grandfather and mother; Stroke in his mother.   Allergies Allergies  Allergen Reactions   Fentanyl Other (See Comments)    Possible involuntary tremors   Methocarbamol Other (See Comments)    confusion     Home Medications  Prior to Admission medications   Medication Sig Start Date End Date Taking? Authorizing Provider  albuterol (VENTOLIN HFA) 108 (90 Base) MCG/ACT inhaler Inhale 2 puffs into the lungs every 4 (four) hours as needed for wheezing or shortness of breath.    [provider]  BREO ELLIPTA 100-25 MCG/INH AEPB Inhale 1 puff into the lungs daily.  02/18/15   [provider]  budesonide (PULMICORT) 0.5 MG/2ML nebulizer solution Take 0.5 mg by nebulization 2 (two) times daily as needed (shortness of breath).    [provider]  celecoxib (CELEBREX) 200 MG capsule Take 1 capsule (200 mg total) by mouth 2 (two) times daily. 03/12/21   Tamala Julian B, PA-C  cyanocobalamin (,VITAMIN B-12,) 1000 MCG/ML injection Inject 1,000 mcg into the muscle every 30 (thirty) days. 02/06/21   [provider]  diazepam (VALIUM) 2 MG tablet Take 2-4 mg by mouth See admin instructions. Prior to injections 07/18/20   [provider]  DULoxetine (CYMBALTA) 30 MG capsule Take 30 mg by mouth daily.    [provider]  enoxaparin (LOVENOX) 40 MG/0.4ML injection Inject 0.4 mLs (40 mg total) into the skin daily for 14 days. 03/12/21 03/26/21  Tamala Julian B, PA-C  ergocalciferol (VITAMIN D2) 1.25  MG (50000 UT) capsule Take 50,000 Units by mouth every Tuesday. 01/17/21   [provider]  gabapentin (NEURONTIN) 400 MG capsule Take 1 capsule (400 mg total) by mouth at bedtime. 06/12/20   Loletha Grayer, MD  lamoTRIgine (LAMICTAL) 100 MG tablet Take 100 mg by mouth at bedtime. 03/02/19   [provider]  lansoprazole (PREVACID) 30 MG capsule Take 30 mg by mouth in the morning and at bedtime. 03/22/19   [provider]  LORazepam (ATIVAN) 0.5 MG tablet Take 0.5 mg by mouth 2 (two) times daily.  06/21/10   [provider]  Magnesium 500 MG CAPS Take 1 capsule by mouth daily  in the afternoon.    [provider]  memantine (NAMENDA XR) 28 MG CP24 24 hr capsule Take 1 capsule by mouth daily. 08/14/13   [provider]  metoprolol succinate (TOPROL-XL) 50 MG 24 hr tablet Take 50 mg by mouth 2 (two) times daily.  03/07/15   [provider]  montelukast (SINGULAIR) 10 MG tablet Take 10 mg by mouth at bedtime.    [provider]  naloxone [facility]) nasal spray 4 mg/0.1 mL Place 1 spray into the nose once.    [provider]  nitroGLYCERIN (NITROLINGUAL) 0.4 MG/SPRAY spray Place 2 sprays under the tongue every 5 (five) minutes x 3 doses as needed for chest pain.  11/26/14   [provider]  nystatin-triamcinolone (MYCOLOG II) cream Apply 1 application topically 2 (two) times daily. 01/13/21   [provider]  olmesartan (BENICAR) 40 MG tablet Take 40 mg by mouth daily.    [provider]  oxyCODONE (OXY IR/ROXICODONE) 5 MG immediate release tablet Take 1 tablet (5 mg total) by mouth every 4 (four) hours as needed for severe pain (pain score 4-6). 03/12/21   Fausto Skillern, PA-C  Red Yeast Rice 600 MG CAPS Take 600 mg by mouth daily. Patient not taking: Reported on 03/04/2021    [provider]  REPATHA SURECLICK 494 MG/ML SOAJ Inject 140 mg into the skin every 14 (fourteen) days. 01/27/21    [provider]  Semaglutide,0.25 or 0.5MG/DOS, (OZEMPIC, 0.25 OR 0.5 MG/DOSE,) 2 MG/1.5ML SOPN Inject 0.5 mg into the skin every Tuesday.    [provider]  torsemide (DEMADEX) 20 MG tablet Take 10 mg by mouth daily. 06/26/20   [provider]  traMADol (ULTRAM) 50 MG tablet Take 1-2 tablets (50-100 mg total) by mouth every 4 (four) hours as needed for moderate pain. 03/12/21   Fausto Skillern, PA-C  traZODone (DESYREL) 100 MG tablet Take 100 mg by mouth at bedtime. 07/08/20   [provider]  valACYclovir (VALTREX) 1000 MG tablet Take 1,000 mg by mouth daily as needed (Fever blisters). 11/04/17   [provider]      Active Hospital Problem list   ***  Assessment & Plan:  ***  Best practice:  Diet:  {WHQP:59163} Pain/Anxiety/Delirium protocol (if indicated): {Pain/Anxiety/Delirium:26941} VAP protocol (if indicated): {VAP:29640} DVT prophylaxis: {DVT Prophylaxis:26933} GI prophylaxis: {GI:26934} Glucose control:  {Glucose Control:26935} Central venous access:  {Central Venous Access:26936} Arterial line:  {Central Venous Access:26936} Foley:  {Central Venous Access:26936} Mobility:  {Mobility:26937}  PT consulted: {PT Consult:26938} Last date of multidisciplinary goals of care discussion [***] Code Status:  {Code Status:26939} Disposition: ***   = Goals of Care = Code Status Order: @CODE @   Primary Emergency Contact: Brymer,Gay, Home Phone: [phone] Wishes to pursue full aggressive treatment and intervention options, including CPR and intubation, but goals of care will be addressed on going with family if that should become necessary.  Critical care time: 45 minutes     Rufina Falco, DNP, CCRN, FNP-C, AGACNP-BC Acute Care Nurse Practitioner  [facility] Pager: [phone] [place] [facility]  .

## 2021-03-25 NOTE — ED Notes (Signed)
This RN and 2nd RN both tried x2 to obtain 2nd point of IV access. Pt hard stick which is exacerbated by inability to stop shaking.

## 2021-03-25 NOTE — Consult Note (Signed)
CODE SEPSIS - PHARMACY COMMUNICATION  **Broad Spectrum Antibiotics should be administered within 1 hour of Sepsis diagnosis**  Time Code Sepsis Called/Page Received: 1746  Antibiotics Ordered: vancomycin and ceftriaxone   Time of 1st antibiotic administration: 1745  Additional action taken by pharmacy: none     Oswald Hillock ,PharmD Clinical Pharmacist  03/25/2021  5:55 PM

## 2021-03-25 NOTE — Progress Notes (Signed)
eLink Physician-Brief Progress Note Patient Name: Johnny Mccoy DOB: 1945-08-18 MRN: [id number]   Date of Service  03/25/2021  HPI/Events of Note  65M with recent knee surgery admitted for AMS and hallucinations. Patient with chills and tremors. Questionable Vfib on telemetry but never lost pulses and answer questions.  No pressor requirement. Patient able to answer questions.  eICU Interventions  Telemetry Sepsis work-up     Intervention Category Evaluation Type: New Patient Evaluation  Johnny Mccoy 03/25/2021, 11:49 PM

## 2021-03-25 NOTE — ED Notes (Signed)
Amio gtt stopped by provider Ouma per other RN.

## 2021-03-25 NOTE — ED Notes (Addendum)
See triage note. Pt in with confusion per wife post R knee surgery. Reports pt on lovenox since surgery. Denies any diabetic or stroke history. Pt also c/o new lower back pain. Small amount of bright blood noted on transparent/occlusive dressing over R knee. Pt's wife denies changes in pt's speech or balance. Reports noticed the confusion "sometime last night". Pt reports difficulty having BM without using stool softeners post surgery and states has been off major pain meds past 24-48 hours. Urine dark amber past few days per pt.

## 2021-03-25 NOTE — ED Notes (Signed)
Attempted for 20g at L fa for 2nd set of cultures. Will not delay antibiotics for 2nd set of cultures since having hard time obtaining 2nd point of access. [place] notified.

## 2021-03-25 NOTE — ED Notes (Signed)
Patient denies pain.

## 2021-03-25 NOTE — ED Notes (Signed)
Wife states pt was just inquiring why spiders were crawling all over the ceiling before this RN walked into room. Pt calmly laying on stretcher currently.

## 2021-03-25 NOTE — ED Notes (Signed)
Pt able to stand at bedside with assistance to use urinal. Steady when bracing self on chair.

## 2021-03-25 NOTE — ED Notes (Signed)
EDP [place] at bedside and notified of BP.

## 2021-03-25 NOTE — Consult Note (Signed)
PHARMACY -  BRIEF ANTIBIOTIC NOTE   Pharmacy has received consult(s) for cellulitis from an ED provider.  The patient's profile has been reviewed for ht/wt/allergies/indication/available labs.    One time order(s) placed for vancomycin  Further antibiotics/pharmacy consults should be ordered by admitting physician if indicated.                       Thank you, Oswald Hillock 03/25/2021  5:29 PM

## 2021-03-26 ENCOUNTER — Encounter: Payer: Self-pay | Admitting: Internal Medicine

## 2021-03-26 ENCOUNTER — Inpatient Hospital Stay
Admit: 2021-03-26 | Discharge: 2021-03-26 | Disposition: A | Discharge status: Home / Self Care | Payer: Medicare Other | Attending: Nurse Practitioner | Admitting: Nurse Practitioner

## 2021-03-26 DIAGNOSIS — R410 Disorientation, unspecified: Secondary | ICD-10-CM | POA: Diagnosis not present

## 2021-03-26 LAB — URINALYSIS, COMPLETE (UACMP) WITH MICROSCOPIC
Bacteria, UA: NONE SEEN
Bilirubin Urine: NEGATIVE
Glucose, UA: NEGATIVE mg/dL
Hgb urine dipstick: NEGATIVE
Ketones, ur: NEGATIVE mg/dL
Leukocytes,Ua: NEGATIVE
Nitrite: NEGATIVE
Protein, ur: NEGATIVE mg/dL
Specific Gravity, Urine: <1.005 — ABNORMAL LOW (ref 1.005–1.030)
Squamous Epithelial / HPF: NONE SEEN (ref 0–5)
pH: 7 (ref 5.0–8.0)

## 2021-03-26 LAB — CBC
HCT: 26.3 % — ABNORMAL LOW (ref 39.0–52.0)
Hemoglobin: 9.4 g/dL — ABNORMAL LOW (ref 13.0–17.0)
MCH: 34.1 pg — ABNORMAL HIGH (ref 26.0–34.0)
MCHC: 35.7 g/dL (ref 30.0–36.0)
MCV: 95.3 fL (ref 80.0–100.0)
Platelets: 139 10*3/uL — ABNORMAL LOW (ref 150–400)
RBC: 2.76 MIL/uL — ABNORMAL LOW (ref 4.22–5.81)
RDW: 13.3 % (ref 11.5–15.5)
WBC: 9.2 10*3/uL (ref 4.0–10.5)
nRBC: 0 % (ref 0.0–0.2)

## 2021-03-26 LAB — BASIC METABOLIC PANEL
Anion gap: 5 (ref 5–15)
BUN: 16 mg/dL (ref 8–23)
CO2: 25 mmol/L (ref 22–32)
Calcium: 8 mg/dL — ABNORMAL LOW (ref 8.9–10.3)
Chloride: 96 mmol/L — ABNORMAL LOW (ref 98–111)
Creatinine, Ser: 1.11 mg/dL (ref 0.61–1.24)
GFR, Estimated: >60 mL/min (ref >60)
Glucose, Bld: 113 mg/dL — ABNORMAL HIGH (ref 70–99)
Potassium: 4.5 mmol/L (ref 3.5–5.1)
Sodium: 126 mmol/L — ABNORMAL LOW (ref 135–145)

## 2021-03-26 LAB — TROPONIN I (HIGH SENSITIVITY): Troponin I (High Sensitivity): 7 ng/L (ref <18)

## 2021-03-26 LAB — CORTISOL: Cortisol, Plasma: 7.3 ug/dL

## 2021-03-26 LAB — TSH: TSH: 2.123 u[IU]/mL (ref 0.350–4.500)

## 2021-03-26 LAB — BRAIN NATRIURETIC PEPTIDE: B Natriuretic Peptide: 188.3 pg/mL — ABNORMAL HIGH (ref 0.0–100.0)

## 2021-03-26 LAB — MAGNESIUM: Magnesium: 1.8 mg/dL (ref 1.7–2.4)

## 2021-03-26 LAB — LACTIC ACID, PLASMA: Lactic Acid, Venous: 1.5 mmol/L (ref 0.5–1.9)

## 2021-03-26 LAB — URIC ACID: Uric Acid, Serum: 6 mg/dL (ref 3.7–8.6)

## 2021-03-26 LAB — SODIUM
Sodium: 124 mmol/L — ABNORMAL LOW (ref 135–145)
Sodium: 127 mmol/L — ABNORMAL LOW (ref 135–145)
Sodium: 126 mmol/L — ABNORMAL LOW (ref 135–145)
Sodium: 124 mmol/L — ABNORMAL LOW (ref 135–145)
Sodium: 127 mmol/L — ABNORMAL LOW (ref 135–145)

## 2021-03-26 LAB — PHOSPHORUS: Phosphorus: 3 mg/dL (ref 2.5–4.6)

## 2021-03-26 LAB — OSMOLALITY: Osmolality: 270 mOsm/kg — ABNORMAL LOW (ref 275–295)

## 2021-03-26 LAB — PROCALCITONIN: Procalcitonin: 0.16 ng/mL

## 2021-03-26 MED ORDER — FLUTICASONE FUROATE-VILANTEROL 100-25 MCG/ACT IN AEPB
1.0000 | INHALATION_SPRAY | Freq: Every day | RESPIRATORY_TRACT | Status: DC
  Administered 2021-03-26 – 2021-03-28 (×3): 1 via RESPIRATORY_TRACT
  Filled 2021-03-26: qty 28

## 2021-03-26 MED ORDER — ALBUTEROL SULFATE HFA 108 (90 BASE) MCG/ACT IN AERS: 2.0000 | INHALATION_SPRAY | RESPIRATORY_TRACT | Status: DC | PRN

## 2021-03-26 MED ORDER — LAMOTRIGINE 100 MG PO TABS
100.0000 mg | ORAL_TABLET | Freq: Every day | ORAL | Status: DC
  Administered 2021-03-26 – 2021-03-27 (×3): 100 mg via ORAL
  Filled 2021-03-26: qty 1
  Filled 2021-03-26: qty 4
  Filled 2021-03-26: qty 1

## 2021-03-26 MED ORDER — IPRATROPIUM-ALBUTEROL 0.5-2.5 (3) MG/3ML IN SOLN
3.0000 mL | Freq: Four times a day (QID) | RESPIRATORY_TRACT | Status: DC
  Administered 2021-03-26: 09:00:00 3 mL via RESPIRATORY_TRACT
  Filled 2021-03-26: qty 3

## 2021-03-26 MED ORDER — VANCOMYCIN HCL 1500 MG/300ML IV SOLN
1500.0000 mg | INTRAVENOUS | Status: DC
  Administered 2021-03-26 – 2021-03-27 (×2): 1500 mg via INTRAVENOUS
  Filled 2021-03-26 (×2): qty 300

## 2021-03-26 MED ORDER — ALBUTEROL SULFATE (2.5 MG/3ML) 0.083% IN NEBU: 2.5000 mg | INHALATION_SOLUTION | RESPIRATORY_TRACT | Status: DC | PRN

## 2021-03-26 MED ORDER — SODIUM CHLORIDE 0.9 % IV SOLN
2.0000 g | Freq: Three times a day (TID) | INTRAVENOUS | Status: DC
  Administered 2021-03-26 – 2021-03-27 (×4): 2 g via INTRAVENOUS
  Filled 2021-03-26 (×7): qty 2

## 2021-03-26 MED ORDER — LORAZEPAM 0.5 MG PO TABS
0.5000 mg | ORAL_TABLET | Freq: Two times a day (BID) | ORAL | Status: DC
  Administered 2021-03-26 – 2021-03-27 (×3): 0.5 mg via ORAL
  Filled 2021-03-26 (×3): qty 1

## 2021-03-26 MED ORDER — PANTOPRAZOLE SODIUM 40 MG PO TBEC
40.0000 mg | DELAYED_RELEASE_TABLET | Freq: Every day | ORAL | Status: DC
  Administered 2021-03-26 – 2021-03-28 (×3): 40 mg via ORAL
  Filled 2021-03-26 (×3): qty 1

## 2021-03-26 MED ORDER — TRAZODONE HCL 50 MG PO TABS
100.0000 mg | ORAL_TABLET | Freq: Every day | ORAL | Status: DC
  Administered 2021-03-26 – 2021-03-27 (×3): 100 mg via ORAL
  Filled 2021-03-26: qty 1
  Filled 2021-03-26 (×2): qty 2

## 2021-03-26 MED ORDER — MONTELUKAST SODIUM 10 MG PO TABS
10.0000 mg | ORAL_TABLET | Freq: Every day | ORAL | Status: DC
  Administered 2021-03-26 – 2021-03-27 (×3): 10 mg via ORAL
  Filled 2021-03-26 (×3): qty 1

## 2021-03-26 MED ORDER — PERFLUTREN LIPID MICROSPHERE
1.0000 mL | INTRAVENOUS | Status: AC | PRN
  Administered 2021-03-26: 10:00:00 2 mL via INTRAVENOUS
  Filled 2021-03-26: qty 10

## 2021-03-26 MED ORDER — PANTOPRAZOLE SODIUM 20 MG PO TBEC
20.0000 mg | DELAYED_RELEASE_TABLET | Freq: Every day | ORAL | Status: DC
  Filled 2021-03-26: qty 1

## 2021-03-26 MED ORDER — DULOXETINE HCL 30 MG PO CPEP
30.0000 mg | ORAL_CAPSULE | Freq: Every day | ORAL | Status: DC
  Administered 2021-03-26 – 2021-03-28 (×3): 30 mg via ORAL
  Filled 2021-03-26 (×3): qty 1

## 2021-03-26 MED ORDER — ENOXAPARIN SODIUM 40 MG/0.4ML IJ SOSY
40.0000 mg | PREFILLED_SYRINGE | INTRAMUSCULAR | Status: DC
  Administered 2021-03-26: 19:00:00 40 mg via SUBCUTANEOUS
  Filled 2021-03-26: qty 0.4

## 2021-03-26 MED ORDER — CHLORHEXIDINE GLUCONATE CLOTH 2 % EX PADS
6.0000 | MEDICATED_PAD | Freq: Every day | CUTANEOUS | Status: DC
  Administered 2021-03-26: 10:00:00 6 via TOPICAL

## 2021-03-26 MED ORDER — LORAZEPAM 2 MG/ML IJ SOLN
1.0000 mg | Freq: Once | INTRAMUSCULAR | Status: AC
  Administered 2021-03-26: 01:00:00 1 mg via INTRAVENOUS
  Filled 2021-03-26: qty 1

## 2021-03-26 MED ORDER — OXYCODONE HCL 5 MG PO TABS
5.0000 mg | ORAL_TABLET | ORAL | Status: DC | PRN
  Administered 2021-03-26 – 2021-03-27 (×5): 5 mg via ORAL
  Filled 2021-03-26 (×5): qty 1

## 2021-03-26 MED ORDER — BUDESONIDE 0.5 MG/2ML IN SUSP: 0.5000 mg | Freq: Two times a day (BID) | RESPIRATORY_TRACT | Status: DC | PRN

## 2021-03-26 NOTE — Progress Notes (Signed)
°  Subjective:     AMS S/p Revision R knee arthroplasty 03/10/21 Patient reports pain as significant in the R knee.  Also complains of discomfort from catheter.  Objective: Vital signs in last 24 hours: Temp:  [97.5 F (36.4 C)-98.6 F (37 C)] 98.2 F (36.8 C) (01/25 1613) Pulse Rate:  [59-94] 79 (01/25 1613) Resp:  [12-26] 16 (01/25 1613) BP: (104-128)/(40-76) 128/76 (01/25 1613) SpO2:  [92 %-100 %] 95 % (01/25 1613) Weight:  [105.9 kg] 105.9 kg (01/25 0500)  Intake/Output from previous day:  Intake/Output Summary (Last 24 hours) at 03/26/2021 1809 Last data filed at 03/26/2021 1652 Gross per 24 hour  Intake 1919.81 ml  Output 4600 ml  Net -2680.19 ml    Intake/Output this shift: Total I/O In: 1919.8 [P.O.:240; I.V.:1218.6; IV Piggyback:461.3] Out: 1775 [VQQVZ:5638]  Labs: Recent Labs    03/25/21 1535 03/25/21 2230 03/26/21 0233  HGB 11.5* 9.4* 9.4*   Recent Labs    03/25/21 2230 03/26/21 0233  WBC 8.7 9.2  RBC 2.80* 2.76*  HCT 26.8* 26.3*  PLT 129* 139*   Recent Labs    03/25/21 1535 03/25/21 2230 03/26/21 0233 03/26/21 0625 03/26/21 1017 03/26/21 1446  NA 121*  --  126*   < > 127* 126*  K 4.7  --  4.5  --   --   --   CL 83*  --  96*  --   --   --   CO2 27  --  25  --   --   --   BUN 22  --  16  --   --   --   CREATININE 1.64* 1.33* 1.11  --   --   --   GLUCOSE 124*  --  113*  --   --   --   CALCIUM 9.3  --  8.0*  --   --   --    < > = values in this interval not displayed.   No results for input(s): LABPT, INR in the last 72 hours.   EXAM General - Patient is Alert, Appropriate, and Oriented Extremity - n/v intact to R LE, effusion present Dressing/Incision -honeycomb in place with significant serosanguinous drainage noted. Following removal of honeycomb steri strips are in place but saturated with drainage, more proximal than distal. No active drainage after removal of steri strips, some slight worsening of erythema. Motor Function - intact,  moving foot and toes well on exam.     Assessment/Plan:    Principal Problem:   Sepsis ([place])  Estimated body mass index is 36.57 kg/m as calculated from the following:   Height as of this encounter: 5\' 7"  (1.702 m).   Weight as of this encounter: 105.9 kg.  AMS -appears significantly improved from yesterday -appreciate input from medicine  -s/p R knee revision  No active drainage noted after removal of strips. Did speak with PT that states patient had significant drainage during therapy. Honeycomb dressing and steri strips were removed. Will speak with medicine tomorrow about transitioning from lovenox to ASA.   Weight-Bearing as tolerated to right leg  Cassell Smiles, PA-C [facility] Orthopaedic Surgery 03/26/2021, 6:09 PM

## 2021-03-26 NOTE — Progress Notes (Signed)
*  PRELIMINARY RESULTS* Echocardiogram 2D Echocardiogram has been performed.  Johnny Mccoy 03/26/2021, 10:09 AM

## 2021-03-26 NOTE — Progress Notes (Signed)
Pharmacy Antibiotic Note  Johnny Mccoy is a 76 y.o. male admitted on 03/25/2021 with sepsis.  Pharmacy has been consulted for Cefepime and Vancomycin dosing.  Plan: Cefepime 2gm q8h per indication and renal fxn.  Vancomycin Pt given Vancomycin 1 gm x 1 dose. Vancomycin 1500 mg IV Q 24 hrs.  Goal AUC 400-550. Expected AUC: 474.9 SCr used: 1.11, Vd used: 0.6, BMI 36.57  Pharmacy will continue to follow and will adjust abx dosing whenever warranted.  Height: 5\' 7"  (170.2 cm) Weight: 105.9 kg (233 lb 7.5 oz) IBW/kg (Calculated) : 66.1  Temp (24hrs), Avg:98.6 F (37 C), Min:98.5 F (36.9 C), Max:98.6 F (37 C)  Recent Labs  Lab 03/25/21 1535 03/25/21 2230 03/26/21 0102 03/26/21 0233  WBC 14.1* 8.7  --  9.2  CREATININE 1.64* 1.33*  --  1.11  LATICACIDVEN 1.8 1.1 1.5  --     Estimated Creatinine Clearance: 67.7 mL/min (by C-G formula based on SCr of 1.11 mg/dL).    Allergies  Allergen Reactions   Tramadol Other (See Comments)    CONTRAINDICATED PATIENT HAS HISTORY OF SEIZURE WILL LOWER SEIZURE THRESHOLD!!!!   Fentanyl Other (See Comments)    Possible involuntary tremors   Methocarbamol Other (See Comments)    confusion    Antimicrobials this admission: 1/24 Ceftriaxone >> x 1 dose 1/24 Vancomycin >>  1/25 Cefepime >>  Microbiology results: 1/24 BCx: Pending 1/24 UCx: Pending   Thank you for allowing pharmacy to be a part of this patients care.  Renda Rolls, PharmD, [facility] 03/26/2021 4:54 AM

## 2021-03-26 NOTE — H&P (Addendum)
NAME:  BRETTON Mccoy, MRN:  [id number], DOB:  03/03/1945, LOS: 0 ADMISSION DATE:  03/25/2021, CONSULTATION DATE:  03/25/2021 REFERRING MD:, Valora Piccolo MD.  CHIEF COMPLAINT: Altered mental status   HPI  76 y.o male with significant PMH of HFpEF, CAD, CVA, HTN, HLD, seizures and VTE and as below who presented to the ED with chief complaints of altered mental status.  Patient underwent right total knee arthroplasty revision on 03/10/2021 and per the wife, was noted to be confused and " out of his mind" since last night.  Patient's wife called Ortho clinic stating that patient was having shaking spells, hallucinating stating that her mind was in the was trying to pull his leg and also complaining of his kidneys hurting and crying with knee pain. He apparently was receiving narcotics round the clock for his knee pain. Patient was sent to the ED for further evaluation.  ED Course: On arrival to the ED, he was afebrile with blood pressure 90/54 mm Hg and pulse rate 50 beats/min., respirations 18 breaths/min with sats 99% on RA. There were no focal neurological deficits; alert but lethargic and confused. Pertinent Labs Findings: Na+/ K+: 121/4.7, Glucose: 124, BUN/Cr.: 22/1.64, WBC:14.1, Hgb/Hct: 11.5/32.7, PCT: 0.20, Lactic acid: 1.8, COVID PCR: Negative, Troponin: pending, VJK:[id number]. Chest xray showed no acute findings. CT knee with large joint effusion.  CTH negative. Patient had episodes of what appeared to be wide complex Vtach without associated symptoms of chest pain or lose os pulse. Patient was started om Amiodarone gtt after bolus. On exam, it appeared episodes of vtach/Vfib was coinciding  with patient's generalized tremors so Amiodarone was stopped. Patient given 30 cc/kg of fluids and started on broad-spectrum antibiotics for sepsis. PCCM consulted for further management.  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 2. Large joint effusion 1/24: Noncontrast CT head>No acute intracranial abnormality   Micro Data:  1/24: SARS-CoV-2 PCR> negative 1/24: Influenza PCR> negative 1/24: Blood culture x2> 1/24: Urine Culture> 1/24: MRSA PCR>>   Antimicrobials:  Vancomycin 1/24> Cefepime 1/25> Ceftriaxone 1/24 x 1  OBJECTIVE  Blood pressure (!) 122/58, pulse 75, temperature 98.5 F (36.9 C), temperature source Oral, resp. rate (!) 21, height 5' 7"  (1.702 m), weight 117 kg, SpO2 100 %.        Intake/Output Summary (Last 24 hours) at 03/25/2021 2231 Last data filed at 03/25/2021 2036 Gross per 24 hour  Intake --  Output 525 ml  Net -525 ml   Filed Weights   03/25/21 1505 03/25/21 1745  Weight: 47.3 kg 117 kg   Physical Examination  GENERAL: 76 year-old critically ill patient lying in the bed with no acute distress.  EYES: Pupils equal, round, reactive to light and accommodation. No  scleral icterus. Extraocular muscles intact.  HEENT: Head atraumatic, normocephalic. Oropharynx and nasopharynx clear.  NECK:  Supple, no jugular venous distention. No thyroid enlargement, no tenderness.  LUNGS: Normal breath sounds bilaterally, no wheezing, rales,rhonchi or crepitation. No use of accessory muscles of respiration.  CARDIOVASCULAR: S1, S2 normal. No murmurs, rubs, or gallops.  ABDOMEN: Soft, nontender, nondistended. Bowel sounds present. No organomegaly or mass.  EXTREMITIES: Bilateral trace edema of lower extremities, no cyanosis, or clubbing.  NEUROLOGIC: Cranial nerves II through XII are intact.  Muscle strength 3/5 in all extremities except right leg. Sensation intact. Gait not checked.  PSYCHIATRIC: The patient is alert and oriented x 1-2.  SKIN: No obvious rash, lesion, or ulcer.    Labs/imaging that I havepersonally reviewed  (right click and "Reselect all SmartList Selections" daily)     Labs   CBC: Recent Labs  Lab 03/25/21 1535  WBC 14.1*  HGB 11.5*  HCT 32.7*  MCV 94.8  PLT 956    Basic Metabolic Panel: Recent Labs  Lab 03/25/21 1535  NA 121*  K 4.7  CL 83*  CO2 27  GLUCOSE 124*  BUN 22  CREATININE 1.64*  CALCIUM 9.3   GFR: Estimated Creatinine Clearance: 48.3 mL/min (A) (by C-G formula based on SCr of 1.64 mg/dL (H)). Recent Labs  Lab 03/25/21 1535  WBC 14.1*  LATICACIDVEN 1.8    Liver Function Tests: Recent Labs  Lab 03/25/21 1535  AST 19  ALT 32  ALKPHOS 58  BILITOT 1.2  PROT 7.4  ALBUMIN 4.1   No results for input(s): LIPASE, AMYLASE in the last 168 hours. No results for input(s): AMMONIA in the last 168 hours.  ABG    Component Value Date/Time   PHART 7.410 10/29/2015 0425   PCO2ART 51.5 (H) 10/29/2015 0425   PO2ART 70.0 (L) 10/29/2015 0425   HCO3 32.6 (H) 10/29/2015 0425   TCO2 34 10/29/2015 0425   O2SAT 94.0 10/29/2015 0425     Coagulation Profile: No results for input(s): INR, PROTIME in the last 168  hours.  Cardiac Enzymes: No results for input(s): CKTOTAL, CKMB, CKMBINDEX, TROPONINI in the last 168 hours.  HbA1C: No results found for: HGBA1C  CBG: No results for input(s): GLUCAP in the last 168 hours.  Review of Systems:   UNABLE TO OBTAIN DUE TO ALTERED MENTAL STATUS  Past Medical History  He,  has a past medical history of (HFpEF) heart failure with preserved ejection fraction ([place]) (02/27/2014), Anxiety, Aortic stenosis (09/20/2014), Arthritis, Asthma, CAD (coronary artery disease), Cataracts, bilateral, Chronic back pain, Chronic venous insufficiency, Complication of anesthesia, COVID-19 (05/2018), CVA (cerebral vascular accident) ([place]), Depressed, Essential tremor, GERD (gastroesophageal reflux disease), Hepatitis B, History of bronchitis, History of hiatal hernia, Hyperlipidemia, Hypertension, Insomnia, Joint pain, Lymphedema, Mild cognitive impairment, Muscle spasm, Numbness, RBBB, Seizures ([place]), Sleep apnea with use of continuous positive airway pressure (CPAP), and VTE (venous thromboembolism).   Surgical History    Past Surgical History:  Procedure Laterality Date   ANKLE ARTHROSCOPY Left 03/07/2020   Procedure: ANKLE ARTHROSCOPY;  Surgeon: Caroline More, DPM;  Location: [facility];  Service: Podiatry;  Laterality: Left;   BACK SURGERY     x3   BLEPHAROPLASTY     CARDIAC CATHETERIZATION     2015   CARPAL TUNNEL  RELEASE     COLONOSCOPY     CORONARY ANGIOPLASTY WITH STENT PLACEMENT     x 3   CORONARY ANGIOPLASTY WITH STENT PLACEMENT Left 01/30/2003   Procedure: CORONARY ANGIOPLASTY WITH STENT PLACEMENT (2.75 x 32 mm Taxus stent to RCA); Location: [facility]; Surgeon: Courtney Paris, MD   dental implants     ESOPHAGOGASTRODUODENOSCOPY     EYE SURGERY     both eyes in 60's d/t muscle causing to squint   IVC FILTER INSERTION N/A 03/04/2021   Procedure: IVC FILTER INSERTION;  Surgeon: Katha Cabal, MD;  Location: [facility];  Service:  Cardiovascular;  Laterality: N/A;   JOINT REPLACEMENT Right    knee   KNEE ARTHROSCOPY     LEFT HEART CATH AND CORONARY ANGIOGRAPHY Left 04/12/2019   Procedure: LEFT HEART CATH AND CORONARY ANGIOGRAPHY;  Surgeon: Corey Skains, MD;  Location: [facility];  Service: Cardiovascular;  Laterality: Left;   MEDIAL PARTIAL KNEE REPLACEMENT Right    MEDIASTERNOTOMY N/A 10/28/2015   Procedure: PARTIAL STERNOTOMY;  Surgeon: [name], MD;  Location: [facility];  Service: Thoracic;  Laterality: N/A;   numerous hand surgery     ORIF ANKLE FRACTURE Left 03/07/2020   Procedure: OPEN REDUCTION INTERNAL FIXATION (ORIF) ANKLE FRACTURE;  Surgeon: Caroline More, DPM;  Location: [facility];  Service: Podiatry;  Laterality: Left;   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   Procedure: RESECTION OF anterier MEDIASTINAL TUMOR;  Surgeon: [name], MD;  Location: [facility];  Service: Thoracic;  Laterality: N/A;   right hand fusion     rotorblator  03/1997   anteriograde amnesia resulted   TENDON REPAIR Left 03/07/2020   Procedure: FLEXOR TENDON REPAIR;  Surgeon: Caroline More, DPM;  Location: [facility];  Service: Podiatry;  Laterality: Left;  sleep apnea   TONSILLECTOMY     TOTAL KNEE REVISION Right 03/10/2021   Procedure: TOTAL KNEE REVISION;  Surgeon: Dereck Leep, MD;  Location: [facility];  Service: Orthopedics;  Laterality: Right;   UMBILICAL HERNIA REPAIR       Social History   reports that he has never smoked. He has never used smokeless tobacco. He reports current alcohol use of about 14.0 standard drinks per week. He reports that he does not use drugs.   Family History   His family history includes COPD in his father; Cancer (age of onset: 25) in his father; Heart disease in his maternal grandfather and mother; Hyperlipidemia in his maternal grandfather and mother; Stroke in his mother.   Allergies Allergies  Allergen Reactions   Fentanyl Other (See  Comments)    Possible involuntary tremors   Methocarbamol Other (See Comments)    confusion     Home Medications  Prior to Admission medications   Medication Sig Start Date End Date Taking? Authorizing Provider  albuterol (VENTOLIN HFA) 108 (90 Base) MCG/ACT inhaler Inhale 2 puffs into the lungs every 4 (four) hours as needed for wheezing or shortness of breath.    [provider]  BREO ELLIPTA 100-25 MCG/INH AEPB Inhale 1 puff into the lungs daily.  02/18/15   [provider]  budesonide (PULMICORT) 0.5 MG/2ML nebulizer solution Take 0.5 mg by nebulization 2 (two) times daily as needed (shortness of breath).    [provider]  celecoxib (CELEBREX) 200 MG capsule Take 1 capsule (200 mg total) by mouth 2 (two) times daily. 03/12/21   Tamala Julian B, PA-C  cyanocobalamin (,VITAMIN  B-12,) 1000 MCG/ML injection Inject 1,000 mcg into the muscle every 30 (thirty) days. 02/06/21   [provider]  diazepam (VALIUM) 2 MG tablet Take 2-4 mg by mouth See admin instructions. Prior to injections 07/18/20   [provider]  DULoxetine (CYMBALTA) 30 MG capsule Take 30 mg by mouth daily.    [provider]  enoxaparin (LOVENOX) 40 MG/0.4ML injection Inject 0.4 mLs (40 mg total) into the skin daily for 14 days. 03/12/21 03/26/21  Tamala Julian B, PA-C  ergocalciferol (VITAMIN D2) 1.25 MG (50000 UT) capsule Take 50,000 Units by mouth every Tuesday. 01/17/21   [provider]  gabapentin (NEURONTIN) 400 MG capsule Take 1 capsule (400 mg total) by mouth at bedtime. 06/12/20   Loletha Grayer, MD  lamoTRIgine (LAMICTAL) 100 MG tablet Take 100 mg by mouth at bedtime. 03/02/19   [provider]  lansoprazole (PREVACID) 30 MG capsule Take 30 mg by mouth in the morning and at bedtime. 03/22/19   [provider]  LORazepam (ATIVAN) 0.5 MG tablet Take 0.5 mg by mouth 2 (two) times daily.  06/21/10   [provider]  Magnesium 500 MG  CAPS Take 1 capsule by mouth daily in the afternoon.    [provider]  memantine (NAMENDA XR) 28 MG CP24 24 hr capsule Take 1 capsule by mouth daily. 08/14/13   [provider]  metoprolol succinate (TOPROL-XL) 50 MG 24 hr tablet Take 50 mg by mouth 2 (two) times daily.  03/07/15   [provider]  montelukast (SINGULAIR) 10 MG tablet Take 10 mg by mouth at bedtime.    [provider]  naloxone [facility]) nasal spray 4 mg/0.1 mL Place 1 spray into the nose once.    [provider]  nitroGLYCERIN (NITROLINGUAL) 0.4 MG/SPRAY spray Place 2 sprays under the tongue every 5 (five) minutes x 3 doses as needed for chest pain.  11/26/14   [provider]  nystatin-triamcinolone (MYCOLOG II) cream Apply 1 application topically 2 (two) times daily. 01/13/21   [provider]  olmesartan (BENICAR) 40 MG tablet Take 40 mg by mouth daily.    [provider]  oxyCODONE (OXY IR/ROXICODONE) 5 MG immediate release tablet Take 1 tablet (5 mg total) by mouth every 4 (four) hours as needed for severe pain (pain score 4-6). 03/12/21   Fausto Skillern, PA-C  Red Yeast Rice 600 MG CAPS Take 600 mg by mouth daily. Patient not taking: Reported on 03/04/2021    [provider]  REPATHA SURECLICK 854 MG/ML SOAJ Inject 140 mg into the skin every 14 (fourteen) days. 01/27/21   [provider]  Semaglutide,0.25 or 0.5MG/DOS, (OZEMPIC, 0.25 OR 0.5 MG/DOSE,) 2 MG/1.5ML SOPN Inject 0.5 mg into the skin every Tuesday.    [provider]  torsemide (DEMADEX) 20 MG tablet Take 10 mg by mouth daily. 06/26/20   [provider]  traMADol (ULTRAM) 50 MG tablet Take 1-2 tablets (50-100 mg total) by mouth every 4 (four) hours as needed for moderate pain. 03/12/21   Fausto Skillern, PA-C  traZODone (DESYREL) 100 MG tablet Take 100 mg by mouth at bedtime. 07/08/20   [provider]  valACYclovir (VALTREX) 1000 MG tablet Take 1,000 mg by  mouth daily as needed (Fever blisters). 11/04/17   [provider]  Scheduled Meds:  DULoxetine  30 mg Oral Daily   fluticasone furoate-vilanterol  1 puff Inhalation Daily   lamoTRIgine  100 mg Oral QHS   LORazepam  0.5 mg Oral BID   montelukast  10 mg Oral QHS   pantoprazole  20 mg Oral Daily   traZODone  100 mg Oral QHS   Continuous Infusions:  amiodarone     lactated ringers 150 mL/hr at 03/26/21 0329   PRN Meds:.albuterol, budesonide, docusate sodium, oxyCODONE, polyethylene glycol  Assessment & Plan:  Acute Metabolic Encephalopathy likely multifactorial in the setting of Hyponatremia, ?infectious process, opiod side effects, underlying Dementia and hx of seizures -Provide supportive care -CTH Negative for acute intracranial abnormality -Check TSH, Ammonia levels -Treat underlying causes -Hold sedating meds including Narcotics for now, will slowly restart home meds once mental status improves  ? Sepsis secondary to Right Knee Cellulitis? Septic Arthritis? S/p Right TKA Revision 03/10/21 [facility]  Lactic: 1.8, Baseline PCT: 0.20, UA: neg, CXR: Neg, CT: shows large joint effusion ? Initial interventions/workup included: 4 L of NS& Ceftriaxone/ Vancomycin -Supplemental oxygen as needed, to maintain SpO2 > 90% -F/u cultures, trend lactic/ PCT -Monitor WBC/ fever curve -Continue broad spectrum abx for now may de-escalate if work up neg -Pressors for MAP goal >65 -Strict I/O's -Consult Ortho for possible arthrocentesis  Hypotonic Acute on Chronic Hyponatremia Appear hypovolemic s/p 4L NS in the ED -Check cortisol, TSH, serum osmolality, sodium osmolality -Hold Diuretics -Received NS bolus in the ED as part of sepsis  treatment, will recheck levels if low will Start IVFs with NS with goal serum sodium level not > 10 to 12 mEq per L in the first 24 hours  -Check serial sodium -Nephrology consult if appropriate  AKI -Monitor I&O's / urinary output -Follow BMP -Ensure  adequate renal perfusion -Avoid nephrotoxic agents as able -Replace electrolytes as indicated  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 HLD  -Continuous cardiac monitoring -Maintain MAP greater than 65 -Hold  Olmesartan and Torsemide in the setting of AKI -Hold Metoprolol due to hypotension and bradycardia -Repeat 2D Echocardiogram   Asthma without evidence of acute exacerbation Obstructive Sleep Apnea  -Supplemental O2 as needed to maintain O2 saturations 88 to 92% -CPAP, at bedtime -Follow intermittent ABG and chest x-ray as needed -PRN and scheduled bronchodilators  Hx of Seizures -Obtain EEG -Seizure precautions -Continue Lamotrigine, will check levels -Discontinue Tramadol due to risk of lowering seizure threshold  Best practice:  Diet:  Oral Pain/Anxiety/Delirium protocol (if indicated): No VAP protocol (if indicated): Not indicated DVT prophylaxis: Subcutaneous Heparin GI prophylaxis: PPI Glucose control:  SSI No Central venous access:  N/A Arterial line:  N/A Foley:  Yes, and it is still needed Mobility:  bed rest  PT consulted: N/A Last date of multidisciplinary goals of care discussion [1/24] Code Status:  full code Disposition: ICU   = Goals of Care = Code Status Order: FULL  Primary Emergency Contact: Moten,Gay, Home Phone: [phone] Wishes to pursue full aggressive treatment and intervention options, including CPR and intubation, but goals of care will be addressed on going with family if that should become necessary.  Critical care time: 45 minutes     Rufina Falco, DNP, CCRN, FNP-C, AGACNP-BC Acute Care Nurse Practitioner  [facility] Pager: [phone] [place] at [facility]  .

## 2021-03-26 NOTE — Progress Notes (Signed)
CHIEF COMPLAINT:   Chief Complaint  Patient presents with   Altered Mental Status    Subjective  Alert and awake drinking coffee this AM,  no pain       Review of Systems:  Gen:  Denies  fever, sweats, chills weight loss  HEENT: Denies blurred vision, double vision, ear pain, eye pain, hearing loss, nose bleeds, sore throat Cardiac:  No dizziness, chest pain or heaviness, chest tightness,edema, No JVD Resp:   No cough, -sputum production, -shortness of breath,-wheezing, -hemoptysis,  Other:  All other systems negative    Objective   Examination:  General exam: Appears calm and comfortable  Respiratory system: Clear to auscultation. Respiratory effort normal. HEENT: [place]/AT, PERRLA, no thrush, no stridor. Cardiovascular system: S1 & S2 heard, RRR.  Gastrointestinal system: Abdomen is nondistended, soft and nontender.  Central nervous system: Alert and oriented. No focal neurological deficits. Extremities: Symmetric 5 x 5 power.honey comb dressing over RT knee, incision looks clean and intact Skin: No rashes, lesions or ulcers Psychiatry: Judgement and insight appear normal. Mood & affect appropriate.   VITALS:  height is 5\' 7"  (1.702 m) and weight is 105.9 kg. His oral temperature is 98.6 F (37 C). His blood pressure is 108/48 (abnormal) and his pulse is 83. His respiration is 15 and oxygen saturation is 94%.   I personally reviewed Labs under Results section.  Radiology Reports CT Head Wo Contrast  Result Date: 03/25/2021 CLINICAL DATA:  Altered mental status. EXAM: CT HEAD WITHOUT CONTRAST TECHNIQUE: Contiguous axial images were obtained from the base of the skull through the vertex without intravenous contrast. RADIATION DOSE REDUCTION: This exam was performed according to the departmental dose-optimization program which includes automated exposure control, adjustment of the mA and/or kV according to patient size and/or use of iterative reconstruction technique.  COMPARISON:  December 21, 2019. FINDINGS: Brain: No evidence of acute infarction, hemorrhage, hydrocephalus, extra-axial collection or mass lesion/mass effect. Vascular: No hyperdense vessel or unexpected calcification. Skull: Normal. Negative for fracture or focal lesion. Sinuses/Orbits: No acute finding. Other: None. IMPRESSION: No acute intracranial abnormality seen. Electronically Signed   By: Marijo Conception M.D.   On: 03/25/2021 16:15   CT KNEE RIGHT W CONTRAST  Result Date: 03/25/2021 CLINICAL DATA:  Status post right knee replacement.  Redness. EXAM: CT OF THE right KNEE WITH CONTRAST TECHNIQUE: Multidetector CT imaging was performed following the standard protocol during bolus administration of intravenous contrast. RADIATION DOSE REDUCTION: This exam was performed according to the departmental dose-optimization program which includes automated exposure control, adjustment of the mA and/or kV according to patient size and/or use of iterative reconstruction technique. CONTRAST:  71mL OMNIPAQUE IOHEXOL 300 MG/ML  SOLN COMPARISON:  Right knee radiograph dated 03/10/2021. FINDINGS: Evaluation is limited due to streak artifact caused by right knee arthroplasty. Bones/Joint/Cartilage There is no acute fracture or dislocation. 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 The arthroplasty components appear intact and in anatomic alignment. There is a large joint effusion which may be postoperative. An infectious process is not excluded clinical correlation is recommended. Ligaments Suboptimally assessed by CT. Muscles and Tendons No acute findings. Soft tissues Diffuse subcutaneous edema and postsurgical changes of the skin over the anterior knee. IMPRESSION: 1. Total right knee arthroplasty. 2. Large joint effusion. Clinical correlation is recommended to evaluate for possibility of an infectious process. Electronically Signed   By: Anner Crete M.D.   On: 03/25/2021 20:37   DG Chest Port 1 View  Result  Date:  03/25/2021 CLINICAL DATA:  SOB EXAM: PORTABLE CHEST 1 VIEW COMPARISON:  April 2022 FINDINGS: The heart appears at the upper limit of normal given projection. No pleural effusion. No pneumothorax. No mass or consolidation. Median sternotomy. IMPRESSION: No acute findings in the chest.  No consolidative pneumonia. Electronically Signed   By: Albin Felling M.D.   On: 03/25/2021 16:05       Assessment/Plan:  MS changes likely due to pain meds and delirium Can transfer to gen med floor, does not have any ICU/SD needs Await ORTHO RECS   Nalina Yeatman Patricia Pesa, M.D.  [facility]  Medical Director [place] Director [facility] Cardio-Pulmonary Department

## 2021-03-26 NOTE — Evaluation (Addendum)
Physical Therapy Evaluation Patient Details Name: Johnny Mccoy MRN: [id number] DOB: 06-Aug-1945 Today's Date: 03/26/2021  History of Present Illness  76 y.o male with who presented to the ED with  altered mental status/hallucinations.   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  PMH of HFpEF, CAD, CVA, HTN, HLD, seizures and VTE  Clinical Impression  Pt clear of mind and able to maintain appropriate conversation, no hallucinations t/o our session.  Pt was able to show good quad strength/control and and had AROM 0-91, with expected pain with this effort.  From a PT perspective he is roughly as would be expected 2 weeks post revision.  Will maintain on PT caseload to continue to rehab and work towards goals.       Recommendations for follow up therapy are one component of a multi-disciplinary discharge planning process, led by the attending physician.  Recommendations may be updated based on patient status, additional functional criteria and insurance authorization.  Follow Up Recommendations Follow physician's recommendations for discharge plan and follow up therapies (HHPT vs outpt)    Assistance Recommended at Discharge Intermittent Supervision/Assistance  Patient can return home with the following  Assistance with cooking/housework;Assist for transportation    Equipment Recommendations None recommended by PT  Recommendations for Other Services       Functional Status Assessment Patient has had a recent decline in their functional status and demonstrates the ability to make significant improvements in function in a reasonable and predictable amount of time.     Precautions / Restrictions Precautions Precautions: Fall Restrictions Weight Bearing Restrictions: Yes RLE Weight Bearing: Weight bearing as tolerated      Mobility  Bed Mobility Overal bed mobility: Modified  Independent Bed Mobility: Supine to Sit     Supine to sit: Supervision     General bed mobility comments: Pt was able to transition slowly to edge of bed without physical assistance    Transfers Overall transfer level: Needs assistance Equipment used: Rolling walker (2 wheels) Transfers: Sit to/from Stand Sit to Stand: Min guard, Min assist           General transfer comment: Pt made great effort with transition to standing, ultimately needed very light assist on second attempt to insure he kept his weight shifting forward.    Ambulation/Gait Ambulation/Gait assistance: Supervision Gait Distance (Feet): 200 Feet Assistive device: Rolling walker (2 wheels)         General Gait Details: Pt was some initial expected hesitancy with R WBing but was able to increased fluidity and confidence with cadence that did improve with cuing and increased time in WBing.  Pt's O2 and HR stable t/o the effort.  Stairs            Wheelchair Mobility    Modified Rankin (Stroke Patients Only)       Balance Overall balance assessment: Modified Independent Sitting-balance support: Feet supported, No upper extremity supported Sitting balance-Leahy Scale: Good     Standing balance support: Bilateral upper extremity supported, Reliant on assistive device for balance Standing balance-Leahy Scale: Fair                               Pertinent Vitals/Pain Pain Assessment Pain Assessment: 0-10 Pain Score: 7  Pain Location: R knee    Home Living Family/patient expects to be discharged to:: Private residence Living Arrangements:  Spouse/significant other Available Help at Discharge: Available 24 hours/day;Family Type of Home: House Home Access: Stairs to enter Entrance Stairs-Rails: Right Entrance Stairs-Number of Steps: 4 Alternate Level Stairs-Number of Steps: 1 into bedroom Home Layout: Multi-level;Able to live on main level with bedroom/bathroom Home Equipment:  Rollator (4 wheels);Shower seat - built in;Cane - single point;Wheelchair - Publishing copy (2 wheels)      Prior Function Prior Level of Function : Independent/Modified Independent             Mobility Comments: Pt able to be active and relatively independent pre TKA revision, has been recoverying well working with PT       Hand Dominance        Extremity/Trunk Assessment   Upper Extremity Assessment Upper Extremity Assessment: Overall WFL for tasks assessed    Lower Extremity Assessment Lower Extremity Assessment: RLE deficits/detail RLE Deficits / Details: 2 wks s/p R TKA revision, good quad strength and AROM >90, significant swelling and dressing saturation       Communication   Communication: HOH  Cognition Arousal/Alertness: Awake/alert Behavior During Therapy: WFL for tasks assessed/performed Overall Cognitive Status: Within Functional Limits for tasks assessed                                 General Comments: Pt alert and appropriate t/o session, no hallucinations, wife reports voice and speech cadence is nearly back to normal.        General Comments General comments (skin integrity, edema, etc.): Pt reports he has been working with PT at home, showed good awareness with exercises    Exercises Total Joint Exercises Ankle Circles/Pumps: AROM, 10 reps Quad Sets: Strengthening, 10 reps Heel Slides: Strengthening, 10 reps (with resisted leg extensions) Hip ABduction/ADduction: Strengthening, 10 reps Straight Leg Raises: AROM, 5 reps Knee Flexion: AROM, PROM, 5 reps (clearly having increased wound drainage with endrange ROM tasks) Goniometric ROM: AROM to 0-91, PROM 95   Assessment/Plan    PT Assessment Patient needs continued PT services  PT Problem List Decreased strength;Pain;Decreased range of motion;Decreased activity tolerance;Decreased balance;Decreased mobility       PT Treatment Interventions DME instruction;Therapeutic  exercise;Gait training;Balance training;Stair training;Neuromuscular re-education;Functional mobility training;Therapeutic activities;Patient/family education    PT Goals (Current goals can be found in the Care Plan section)  Acute Rehab PT Goals Patient Stated Goal: To go home PT Goal Formulation: With patient Time For Goal Achievement: 04/09/21 Potential to Achieve Goals: Good    Frequency QD     Co-evaluation               AM-PAC PT "6 Clicks" Mobility  Outcome Measure Help needed turning from your back to your side while in a flat bed without using bedrails?: None Help needed moving from lying on your back to sitting on the side of a flat bed without using bedrails?: None Help needed moving to and from a bed to a chair (including a wheelchair)?: None Help needed standing up from a chair using your arms (e.g., wheelchair or bedside chair)?: A Little Help needed to walk in hospital room?: A Little Help needed climbing 3-5 steps with a railing? : A Little 6 Click Score: 21    End of Session Equipment Utilized During Treatment: Gait belt Activity Tolerance: Patient tolerated treatment well Patient left: in chair;with call bell/phone within reach;with chair alarm set;with nursing/sitter in room   PT Visit Diagnosis: Unsteadiness on feet (R26.81);Other abnormalities  of gait and mobility (R26.89);Muscle weakness (generalized) (M62.81);Pain Pain - Right/Left: Right Pain - part of body: Knee    Time: 1448-1536 PT Time Calculation (min) (ACUTE ONLY): 48 min   Charges:   PT Evaluation $PT Eval Low Complexity: 1 Low PT Treatments $Gait Training: 8-22 mins $Therapeutic Exercise: 8-22 mins        Kreg Shropshire, DPT 03/26/2021, 4:51 PM

## 2021-03-26 NOTE — Progress Notes (Signed)
° °  Subjective:   AMS S/p Revision R knee arthroplasty 03/10/21 Patient oriented x3 this morning, states year is 2069. Complaints of groin pain from catheter, R knee pain.    Objective: Vital signs in last 24 hours: Temp:  [97.5 F (36.4 C)-98.6 F (37 C)] 98.5 F (36.9 C) (01/25 1220) Pulse Rate:  [48-94] 75 (01/25 1220) Resp:  [10-26] 18 (01/25 1220) BP: (72-126)/(40-63) 109/52 (01/25 1220) SpO2:  [92 %-100 %] 92 % (01/25 1220) Weight:  [105.9 kg-117 kg] 105.9 kg (01/25 0500)  Intake/Output from previous day:  Intake/Output Summary (Last 24 hours) at 03/26/2021 1605 Last data filed at 03/26/2021 1500 Gross per 24 hour  Intake 1919.81 ml  Output 4225 ml  Net -2305.19 ml    Intake/Output this shift: Total I/O In: 1919.8 [P.O.:240; I.V.:1218.6; IV Piggyback:461.3] Out: 1400 [Urine:1400]  Labs: Recent Labs    03/25/21 1535 03/25/21 2230 03/26/21 0233  HGB 11.5* 9.4* 9.4*   Recent Labs    03/25/21 2230 03/26/21 0233  WBC 8.7 9.2  RBC 2.80* 2.76*  HCT 26.8* 26.3*  PLT 129* 139*   Recent Labs    03/25/21 1535 03/25/21 2230 03/26/21 0233 03/26/21 0625 03/26/21 1017 03/26/21 1446  NA 121*  --  126*   < > 127* 126*  K 4.7  --  4.5  --   --   --   CL 83*  --  96*  --   --   --   CO2 27  --  25  --   --   --   BUN 22  --  16  --   --   --   CREATININE 1.64* 1.33* 1.11  --   --   --   GLUCOSE 124*  --  113*  --   --   --   CALCIUM 9.3  --  8.0*  --   --   --    < > = values in this interval not displayed.   No results for input(s): LABPT, INR in the last 72 hours.   EXAM General - Patient is  somnolent, oriented x3, does not know year Extremity - Neurovascular intact Dorsiflexion/Plantar flexion intact Compartment soft Dressing/Incision - Honeycomb dressing over R knee shows serosanguinous drainage, steri strips remain in place. No active drainage noted.  Motor Function - intact, moving foot and toes well on exam. Able to perform SLR  Assessment/Plan:     Principal Problem:   Sepsis ([place])  Estimated body mass index is 36.57 kg/m as calculated from the following:   Height as of this encounter: 5\' 7"  (1.702 m).   Weight as of this encounter: 105.9 kg.  AMS -labs reviewed -recommendations per medicine  -s/p R knee revision Fresh honeycomb dressing applied.     DVT Prophylaxis - 81 mg ASA BID    Cassell Smiles, PA-C [facility] 2 Orthopaedic Surgery 03/26/2021, 4:05 PM

## 2021-03-27 DIAGNOSIS — G9341 Metabolic encephalopathy: Principal | ICD-10-CM

## 2021-03-27 DIAGNOSIS — M25561 Pain in right knee: Secondary | ICD-10-CM

## 2021-03-27 LAB — ECHOCARDIOGRAM COMPLETE
AR max vel: 1.09 cm2
AV Area VTI: 1.4 cm2
AV Area mean vel: 1.25 cm2
AV Mean grad: 10 mmHg
AV Peak grad: 20.1 mmHg
Ao pk vel: 2.24 m/s
Area-P 1/2: 3.39 cm2
Height: 67 in
MV VTI: 1.51 cm2
S' Lateral: 3.29 cm
Weight: 3735.47 oz

## 2021-03-27 LAB — SODIUM
Sodium: 128 mmol/L — ABNORMAL LOW (ref 135–145)
Sodium: 129 mmol/L — ABNORMAL LOW (ref 135–145)
Sodium: 127 mmol/L — ABNORMAL LOW (ref 135–145)

## 2021-03-27 LAB — PROCALCITONIN: Procalcitonin: <0.1 ng/mL

## 2021-03-27 LAB — URINE CULTURE: Culture: NO GROWTH

## 2021-03-27 LAB — CREATININE, SERUM
Creatinine, Ser: 0.86 mg/dL (ref 0.61–1.24)
GFR, Estimated: >60 mL/min (ref >60)

## 2021-03-27 MED ORDER — BISACODYL 10 MG RE SUPP
10.0000 mg | Freq: Every day | RECTAL | Status: DC
  Administered 2021-03-27: 10 mg via RECTAL
  Filled 2021-03-27: qty 1

## 2021-03-27 MED ORDER — BENZOCAINE 10 % MT GEL
Freq: Three times a day (TID) | OROMUCOSAL | Status: DC | PRN
  Administered 2021-03-27: 1 via OROMUCOSAL
  Filled 2021-03-27: qty 9

## 2021-03-27 MED ORDER — ASPIRIN EC 325 MG PO TBEC
325.0000 mg | DELAYED_RELEASE_TABLET | Freq: Every day | ORAL | Status: DC
  Administered 2021-03-27 – 2021-03-28 (×2): 325 mg via ORAL
  Filled 2021-03-27 (×2): qty 1

## 2021-03-27 MED ORDER — CELECOXIB 200 MG PO CAPS
200.0000 mg | ORAL_CAPSULE | Freq: Two times a day (BID) | ORAL | Status: DC
  Administered 2021-03-27 – 2021-03-28 (×3): 200 mg via ORAL
  Filled 2021-03-27 (×4): qty 1

## 2021-03-27 MED ORDER — SULFAMETHOXAZOLE-TRIMETHOPRIM 800-160 MG PO TABS
1.0000 | ORAL_TABLET | Freq: Two times a day (BID) | ORAL | Status: DC
  Administered 2021-03-27 – 2021-03-28 (×3): 1 via ORAL
  Filled 2021-03-27 (×5): qty 1

## 2021-03-27 MED ORDER — METOPROLOL SUCCINATE ER 50 MG PO TB24
50.0000 mg | ORAL_TABLET | Freq: Two times a day (BID) | ORAL | Status: DC
  Administered 2021-03-27 – 2021-03-28 (×3): 50 mg via ORAL
  Filled 2021-03-27 (×3): qty 1

## 2021-03-27 MED ORDER — GABAPENTIN 300 MG PO CAPS
400.0000 mg | ORAL_CAPSULE | Freq: Every day | ORAL | Status: DC
  Administered 2021-03-27: 22:00:00 400 mg via ORAL
  Filled 2021-03-27: qty 1

## 2021-03-27 MED ORDER — IRBESARTAN 75 MG PO TABS
37.5000 mg | ORAL_TABLET | Freq: Every day | ORAL | Status: DC
  Administered 2021-03-27 – 2021-03-28 (×2): 37.5 mg via ORAL
  Filled 2021-03-27 (×4): qty 0.5

## 2021-03-27 MED ORDER — MEMANTINE HCL ER 28 MG PO CP24
28.0000 mg | ORAL_CAPSULE | Freq: Every day | ORAL | Status: DC
  Administered 2021-03-27 – 2021-03-28 (×2): 28 mg via ORAL
  Filled 2021-03-27 (×4): qty 1

## 2021-03-27 NOTE — Progress Notes (Signed)
Physical Therapy Treatment Patient Details Name: Johnny Mccoy MRN: [id number] DOB: 1946-01-18 Today's Date: 03/27/2021   History of Present Illness 76 y.o male with who presented to the ED with  altered mental status/hallucinations.   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  PMH of HFpEF, CAD, CVA, HTN, HLD, seizures and VTE    PT Comments    Pt with better awareness and insight today and did very well with mobility, ambulation and exercises.  Good strength and ROM in surgical knee and able to ambulate very well with walker and also able to ambulate with single UE support along rail w/o issue.  Also negotiated up/down steps w/o issue.  Doing well from a PT/post TKA revision status.  Recommendations for follow up therapy are one component of a multi-disciplinary discharge planning process, led by the attending physician.  Recommendations may be updated based on patient status, additional functional criteria and insurance authorization.  Follow Up Recommendations  Follow physician's recommendations for discharge plan and follow up therapies     Assistance Recommended at Discharge Intermittent Supervision/Assistance  Patient can return home with the following Assistance with cooking/housework;Assist for transportation   Equipment Recommendations  None recommended by PT    Recommendations for Other Services       Precautions / Restrictions Precautions Precautions: Fall Restrictions RLE Weight Bearing: Weight bearing as tolerated     Mobility  Bed Mobility Overal bed mobility: Modified Independent       Supine to sit: Modified independent (Device/Increase time)     General bed mobility comments: easily gets to EOB    Transfers Overall transfer level: Modified independent Equipment used: Rolling walker (2 wheels) Transfers: Sit to/from Stand Sit to Stand: Min guard            General transfer comment: some extra cuing for appropriate set up and sequencing but able to rise w/o issue    Ambulation/Gait Ambulation/Gait assistance: Supervision Gait Distance (Feet): 200 Feet Assistive device: Rolling walker (2 wheels)         General Gait Details: Pt able to ambulate with confident and consistent cadence.  Was able to ambulate ~25 ft with L hallway rail only, again with good confidence and weight acceptance.  HR into the 120s with O2 in the high 90s.   Stairs Stairs: Yes Stairs assistance: Supervision Stair Management: One rail Right Number of Stairs: 5 General stair comments: Pt was able to negotiate up/down steps with single R rail w/o assist needing minimal cuing and   Wheelchair Mobility    Modified Rankin (Stroke Patients Only)       Balance Overall balance assessment: Modified Independent Sitting-balance support: Feet supported, No upper extremity supported Sitting balance-Leahy Scale: Good     Standing balance support: Bilateral upper extremity supported, Reliant on assistive device for balance Standing balance-Leahy Scale: Good Standing balance comment: No unsteadiness or safety concerns during standing activity                            Cognition Arousal/Alertness: Awake/alert Behavior During Therapy: WFL for tasks assessed/performed Overall Cognitive Status: Within Functional Limits for tasks assessed  Exercises Total Joint Exercises Quad Sets: Strengthening, 10 reps Short Arc Quad: AROM, Strengthening, Right, 15 reps, Seated Hip ABduction/ADduction: Strengthening, 10 reps Long Arc Quad: Strengthening, 10 reps Knee Flexion: PROM, 5 reps Goniometric ROM: 0 to >100    General Comments        Pertinent Vitals/Pain Pain Assessment Pain Assessment: 0-10 Pain Score: 2     Home Living                          Prior Function             PT Goals (current goals can now be found in the care plan section) Progress towards PT goals: Progressing toward goals    Frequency    7X/week      PT Plan Current plan remains appropriate    Co-evaluation              AM-PAC PT "6 Clicks" Mobility   Outcome Measure  Help needed turning from your back to your side while in a flat bed without using bedrails?: None Help needed moving from lying on your back to sitting on the side of a flat bed without using bedrails?: None Help needed moving to and from a bed to a chair (including a wheelchair)?: None Help needed standing up from a chair using your arms (e.g., wheelchair or bedside chair)?: None Help needed to walk in hospital room?: None Help needed climbing 3-5 steps with a railing? : A Little 6 Click Score: 23    End of Session Equipment Utilized During Treatment: Gait belt Activity Tolerance: Patient tolerated treatment well Patient left: in chair;with call bell/phone within reach;with chair alarm set;with nursing/sitter in room Nurse Communication: Mobility status PT Visit Diagnosis: Unsteadiness on feet (R26.81);Other abnormalities of gait and mobility (R26.89);Muscle weakness (generalized) (M62.81);Pain Pain - Right/Left: Right Pain - part of body: Knee     Time: 4315-4008 PT Time Calculation (min) (ACUTE ONLY): 30 min  Charges:  $Gait Training: 8-22 mins $Therapeutic Exercise: 8-22 mins                     Kreg Shropshire, DPT 03/27/2021, 2:09 PM

## 2021-03-27 NOTE — Progress Notes (Signed)
°  Subjective:     AMS S/p Revision R knee arthroplasty 03/10/21 Patient is A&O x4 this PM, continues to complain of significant right knee pain.   Denies any chest pain or SOB, does complain of kidney pain.   Objective: Vital signs in last 24 hours: Temp:  [97.8 F (36.6 C)-98.2 F (36.8 C)] 98.2 F (36.8 C) (01/26 1145) Pulse Rate:  [79-99] 99 (01/26 1252) Resp:  [16-18] 16 (01/26 1145) BP: (123-145)/(64-76) 142/68 (01/26 1252) SpO2:  [95 %-96 %] 95 % (01/26 1145) Weight:  [111.2 kg] 111.2 kg (01/26 0116)  Intake/Output from previous day:  Intake/Output Summary (Last 24 hours) at 03/27/2021 1351 Last data filed at 03/27/2021 0900 Gross per 24 hour  Intake 540.39 ml  Output 2850 ml  Net -2309.61 ml    Intake/Output this shift: Total I/O In: 240 [P.O.:240] Out: 325 [Urine:325]  Labs: Recent Labs    03/25/21 1535 03/25/21 2230 03/26/21 0233  HGB 11.5* 9.4* 9.4*   Recent Labs    03/25/21 2230 03/26/21 0233  WBC 8.7 9.2  RBC 2.80* 2.76*  HCT 26.8* 26.3*  PLT 129* 139*   Recent Labs    03/25/21 1535 03/25/21 2230 03/26/21 0233 03/26/21 0625 03/27/21 0202 03/27/21 0559 03/27/21 0946  NA 121*  --  126*   < > 128* 129* 127*  K 4.7  --  4.5  --   --   --   --   CL 83*  --  96*  --   --   --   --   CO2 27  --  25  --   --   --   --   BUN 22  --  16  --   --   --   --   CREATININE 1.64*   < > 1.11  --  0.86  --   --   GLUCOSE 124*  --  113*  --   --   --   --   CALCIUM 9.3  --  8.0*  --   --   --   --    < > = values in this interval not displayed.   No results for input(s): LABPT, INR in the last 72 hours.   EXAM General - Patient is Alert, Appropriate, and Oriented Extremity - Neurovascular intact Dorsiflexion/Plantar flexion intact Compartment soft Dressing/Incision -ACE wrap in place over R knee. Following removal, significant drainage noted on gauze but no active drainage noted. No change in erythema from previous. Effusion noted.  Motor Function -  intact, moving foot and toes well on exam.    Assessment/Plan:    Principal Problem:   Sepsis ([place]) Active Problems:   Acute metabolic encephalopathy   Right knee pain  Estimated body mass index is 38.4 kg/m as calculated from the following:   Height as of this encounter: 5\' 7"  (1.702 m).   Weight as of this encounter: 111.2 kg. Advance diet Up with therapy  AMS -appears resolved. -Labs reviewed, Na is trending up, last value 127.  Dressing removed from R knee. Steri strips applied to incision, then gauze dressing with ABDs reapplied.   DVT Prophylaxis - Lovenox, Ted hose, and SCDs Weight-Bearing as tolerated to right leg  Cassell Smiles, PA-C [facility] Orthopaedic Surgery 03/27/2021, 1:51 PM

## 2021-03-27 NOTE — TOC Progression Note (Signed)
Transition of Care [facility]) - Progression Note    Patient Details  Name: Johnny Mccoy MRN: [id number] Date of Birth: 1945-06-20  Transition [facility]) CM/SW [place], RN Phone Number: 03/27/2021, 2:56 PM  Clinical Narrative:   Anticipated discharge tomorrow.  Patient states he has all DME, no concerns about medications.  He has outpatient PT already set, no concerns transporting to appointments.  TOC contactinformation provided, TOC to  follow if needs arise.         Expected Discharge Plan and Services                                                 Social Determinants of Health (SDOH) Interventions    Readmission Risk Interventions No flowsheet data found.

## 2021-03-27 NOTE — Progress Notes (Signed)
[facility] [place] NAME: Johnny Mccoy    MR#:  [id number]  DATE OF BIRTH:  14-Nov-1945  SUBJECTIVE:  patient sitting up in the chair. His alert and oriented times three. Wife at bedside. Complains of constipation. He is tolerating PO diet well. Worked with physical therapy yesterday. No fever.  REVIEW OF SYSTEMS:   Review of Systems  Constitutional:  Negative for chills, fever and weight loss.  HENT:  Negative for ear discharge, ear pain and nosebleeds.   Eyes:  Negative for blurred vision, pain and discharge.  Respiratory:  Negative for sputum production, shortness of breath, wheezing and stridor.   Cardiovascular:  Negative for chest pain, palpitations, orthopnea and PND.  Gastrointestinal:  Negative for abdominal pain, diarrhea, nausea and vomiting.  Genitourinary:  Negative for frequency and urgency.  Musculoskeletal:  Positive for joint pain. Negative for back pain.  Neurological:  Negative for sensory change, speech change, focal weakness and weakness.  Psychiatric/Behavioral:  Negative for depression and hallucinations. The patient is not nervous/anxious.   Tolerating Diet:yes Tolerating PT: outpt PT-- patient already has been set [facility] with [facility]  DRUG ALLERGIES:   Allergies  Allergen Reactions   Tramadol Other (See Comments)    CONTRAINDICATED PATIENT HAS HISTORY OF SEIZURE WILL LOWER SEIZURE THRESHOLD!!!!   Fentanyl Other (See Comments)    Possible involuntary tremors   Methocarbamol Other (See Comments)    confusion    VITALS:  Blood pressure (!) 142/73, pulse 96, temperature 98.1 F (36.7 C), temperature source Oral, resp. rate 16, height 5\' 7"  (1.702 m), weight 111.2 kg, SpO2 96 %.  PHYSICAL EXAMINATION:   Physical Exam  GENERAL:  76 y.o.-year-old patient lying in the bed with no acute distress. Obese HEENT: Head atraumatic, normocephalic. Oropharynx and nasopharynx clear.  LUNGS: Normal breath sounds  bilaterally, no wheezing, rales, rhonchi.  CARDIOVASCULAR: S1, S2 normal. No murmurs, rubs, or gallops.  ABDOMEN: Soft, nontender, nondistended. Bowel sounds present.  EXTREMITIES: on admission     NEUROLOGIC: nonfocal PSYCHIATRIC:  patient is alert and awake SKIN: as above  LABORATORY PANEL:  CBC Recent Labs  Lab 03/26/21 0233  WBC 9.2  HGB 9.4*  HCT 26.3*  PLT 139*    Chemistries  Recent Labs  Lab 03/25/21 1535 03/25/21 2230 03/26/21 0233 03/26/21 0625 03/27/21 0202 03/27/21 0559 03/27/21 0946  NA 121*  --  126*   < > 128*   < > 127*  K 4.7  --  4.5  --   --   --   --   CL 83*  --  96*  --   --   --   --   CO2 27  --  25  --   --   --   --   GLUCOSE 124*  --  113*  --   --   --   --   BUN 22  --  16  --   --   --   --   CREATININE 1.64*   < > 1.11  --  0.86  --   --   CALCIUM 9.3  --  8.0*  --   --   --   --   MG  --   --  1.8  --   --   --   --   AST 19  --   --   --   --   --   --  ALT 32  --   --   --   --   --   --   ALKPHOS 58  --   --   --   --   --   --   BILITOT 1.2  --   --   --   --   --   --    < > = values in this interval not displayed.   Cardiac Enzymes No results for input(s): TROPONINI in the last 168 hours. RADIOLOGY:  CT Head Wo Contrast  Result Date: 03/25/2021 CLINICAL DATA:  Altered mental status. EXAM: CT HEAD WITHOUT CONTRAST TECHNIQUE: Contiguous axial images were obtained from the base of the skull through the vertex without intravenous contrast. RADIATION DOSE REDUCTION: This exam was performed according to the departmental dose-optimization program which includes automated exposure control, adjustment of the mA and/or kV according to patient size and/or use of iterative reconstruction technique. COMPARISON:  December 21, 2019. FINDINGS: Brain: No evidence of acute infarction, hemorrhage, hydrocephalus, extra-axial collection or mass lesion/mass effect. Vascular: No hyperdense vessel or unexpected calcification. Skull: Normal. Negative for  fracture or focal lesion. Sinuses/Orbits: No acute finding. Other: None. IMPRESSION: No acute intracranial abnormality seen. Electronically Signed   By: Marijo Conception M.D.   On: 03/25/2021 16:15   CT KNEE RIGHT W CONTRAST  Result Date: 03/25/2021 CLINICAL DATA:  Status post right knee replacement.  Redness. EXAM: CT OF THE right KNEE WITH CONTRAST TECHNIQUE: Multidetector CT imaging was performed following the standard protocol during bolus administration of intravenous contrast. RADIATION DOSE REDUCTION: This exam was performed according to the departmental dose-optimization program which includes automated exposure control, adjustment of the mA and/or kV according to patient size and/or use of iterative reconstruction technique. CONTRAST:  63mL OMNIPAQUE IOHEXOL 300 MG/ML  SOLN COMPARISON:  Right knee radiograph dated 03/10/2021. FINDINGS: Evaluation is limited due to streak artifact caused by right knee arthroplasty. Bones/Joint/Cartilage There is no acute fracture or dislocation. 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 The arthroplasty components appear intact and in anatomic alignment. There is a large joint effusion which may be postoperative. An infectious process is not excluded clinical correlation is recommended. Ligaments Suboptimally assessed by CT. Muscles and Tendons No acute findings. Soft tissues Diffuse subcutaneous edema and postsurgical changes of the skin over the anterior knee. IMPRESSION: 1. Total right knee arthroplasty. 2. Large joint effusion. Clinical correlation is recommended to evaluate for possibility of an infectious process. Electronically Signed   By: Anner Crete M.D.   On: 03/25/2021 20:37   DG Chest Port 1 View  Result Date: 03/25/2021 CLINICAL DATA:  SOB EXAM: PORTABLE CHEST 1 VIEW COMPARISON:  April 2022 FINDINGS: The heart appears at the upper limit of normal given projection. No pleural effusion. No pneumothorax. No mass or consolidation. Median sternotomy.  IMPRESSION: No acute findings in the chest.  No consolidative pneumonia. Electronically Signed   By: Albin Felling M.D.   On: 03/25/2021 16:05   ECHOCARDIOGRAM COMPLETE  Result Date: 03/27/2021    ECHOCARDIOGRAM REPORT   Patient Name:   Johnny Mccoy Date of Exam: 03/26/2021 Medical Rec #:  [id number]      Height:       67.0 in Accession #:    [id number]     Weight:       233.5 lb Date of Birth:  [birth year]       BSA:          2.160 m Patient Age:  75 years       BP:           108/48 mmHg Patient Gender: M              HR:           80 bpm. Exam Location:  [facility] Procedure: 2D Echo, Color Doppler, Cardiac Doppler and Intracardiac            Opacification Agent Indications:     I50.31 congestive heart failure-Acute Diastolic  History:         Patient has no prior history of Echocardiogram examinations.                  HFpEF, CAD, Stroke; Risk Factors:Hypertension, Dyslipidemia and                  Sleep Apnea.  Sonographer:     Charmayne Sheer Referring Phys:  [place] Diagnosing Phys: Yolonda Kida MD  Sonographer Comments: Suboptimal apical window and no subcostal window. IMPRESSIONS  1. Left ventricular ejection fraction, by estimation, is 55 to 60%. The left ventricle has normal function. The left ventricle has no regional wall motion abnormalities. Left ventricular diastolic parameters are consistent with Grade I diastolic dysfunction (impaired relaxation).  2. Right ventricular systolic function is normal. The right ventricular size is mildly enlarged.  3. Left atrial size was mildly dilated.  4. Right atrial size was mildly dilated.  5. The mitral valve is normal in structure. Trivial mitral valve regurgitation.  6. The aortic valve is normal in structure. There is moderate calcification of the aortic valve. There is mild thickening of the aortic valve. Aortic valve regurgitation is not visualized. Mild aortic valve stenosis. FINDINGS  Left Ventricle: Left ventricular ejection fraction,  by estimation, is 55 to 60%. The left ventricle has normal function. The left ventricle has no regional wall motion abnormalities. Definity contrast agent was given IV to delineate the left ventricular  endocardial borders. The left ventricular internal cavity size was normal in size. There is no left ventricular hypertrophy. Left ventricular diastolic parameters are consistent with Grade I diastolic dysfunction (impaired relaxation). Right Ventricle: The right ventricular size is mildly enlarged. No increase in right ventricular wall thickness. Right ventricular systolic function is normal. Left Atrium: Left atrial size was mildly dilated. Right Atrium: Right atrial size was mildly dilated. Pericardium: There is no evidence of pericardial effusion. Mitral Valve: The mitral valve is normal in structure. There is mild thickening of the mitral valve leaflet(s). There is mild calcification of the mitral valve leaflet(s). Mild to moderate mitral annular calcification. Trivial mitral valve regurgitation.  MV peak gradient, 5.8 mmHg. The mean mitral valve gradient is 2.0 mmHg. Tricuspid Valve: The tricuspid valve is normal in structure. Tricuspid valve regurgitation is trivial. Aortic Valve: The aortic valve is normal in structure. There is moderate calcification of the aortic valve. There is mild thickening of the aortic valve. Aortic valve regurgitation is not visualized. Mild aortic stenosis is present. Aortic valve mean gradient measures 10.0 mmHg. Aortic valve peak gradient measures 20.1 mmHg. Aortic valve area, by VTI measures 1.40 cm. Pulmonic Valve: The pulmonic valve was normal in structure. Pulmonic valve regurgitation is not visualized. Aorta: The ascending aorta was not well visualized. IAS/Shunts: No atrial level shunt detected by color flow Doppler.  LEFT VENTRICLE PLAX 2D LVIDd:         4.68 cm   Diastology LVIDs:  3.29 cm   LV e' medial:    5.66 cm/s LV PW:         1.28 cm   LV E/e' medial:  16.3  LV IVS:        1.04 cm   LV e' lateral:   9.25 cm/s LVOT diam:     1.90 cm   LV E/e' lateral: 10.0 LV SV:         50 LV SV Index:   23 LVOT Area:     2.84 cm  RIGHT VENTRICLE RV Basal diam:  4.65 cm LEFT ATRIUM         Index       RIGHT ATRIUM           Index LA diam:    4.00 cm 1.85 cm/m  RA Area:     21.00 cm                                 RA Volume:   55.20 ml  25.56 ml/m  AORTIC VALVE                     PULMONIC VALVE AV Area (Vmax):    1.09 cm      PV Vmax:       1.22 m/s AV Area (Vmean):   1.25 cm      PV Vmean:      84.200 cm/s AV Area (VTI):     1.40 cm      PV VTI:        0.203 m AV Vmax:           224.25 cm/s   PV Peak grad:  6.0 mmHg AV Vmean:          147.500 cm/s  PV Mean grad:  3.0 mmHg AV VTI:            0.356 m AV Peak Grad:      20.1 mmHg AV Mean Grad:      10.0 mmHg LVOT Vmax:         86.20 cm/s LVOT Vmean:        65.100 cm/s LVOT VTI:          0.175 m LVOT/AV VTI ratio: 0.49  AORTA Ao Root diam: 3.70 cm MITRAL VALVE                TRICUSPID VALVE MV Area (PHT): 3.39 cm     TR Peak grad:   30.7 mmHg MV Area VTI:   1.51 cm     TR Vmax:        277.00 cm/s MV Peak grad:  5.8 mmHg MV Mean grad:  2.0 mmHg     SHUNTS MV Vmax:       1.20 m/s     Systemic VTI:  0.18 m MV Vmean:      74.4 cm/s    Systemic Diam: 1.90 cm MV Decel Time: 224 msec MV E velocity: 92.10 cm/s MV A velocity: 104.00 cm/s MV E/A ratio:  0.89 Johnny D Callwood MD Electronically signed by Yolonda Kida MD Signature Date/Time: 03/27/2021/8:29:35 AM    Final    ASSESSMENT AND PLAN:  76 y.o male with significant PMH of HFpEF, CAD, CVA, HTN, HLD, seizures and VTE who  underwent right total knee arthroplasty revision on 03/10/2021 and per the wife, was noted to be confused and " out of  his mind" since last night.    Acute metabolic encephalopathy suspected due to opioid side effects with underlying mild cognitive/dementia -- infectious etiology ruled out -- patient is meditating well -- no fever -- avoid large doses of  opioids.  Recent right knee arthroplasty with mild serous discharge -- discussed with orthopedic Dr. Marry Guan-- okay with oral antibiotic. Change to PO Bactrim -- continue outpatient physical therapy -- follow-up with Dr. Marry Guan -- aspirin 325 mg daily for DVT prophylaxis for now -- CT right knee shows large effusion discussed with Dr. Marry Guan-- this likely is due to postop change.  chronic hyponatremia --Pt history of chronic hyponatremia for many years. -- Patient is euvolemic -- hold torsemide take as needed  Constipation -- stool softener and Dulcolax  seizure disorder -- continue lamotrigine    Procedures: Family communication : wife at bedside Consults : orthopedic CODE STATUS: full DVT Prophylaxis : aspirin Level of care: Telemetry Medical Status is: Inpatient  Remains inpatient appropriate because: patient just got out of ICU. Will monitor for one more day discharge to home tomorrow.        TOTAL TIME TAKING CARE OF THIS PATIENT: 30 minutes.  >50% time spent on counselling and coordination of care  Note: This dictation was prepared with Dragon dictation along with smaller phrase technology. Any transcriptional errors that result from this process are unintentional.  Fritzi Mandes M.D    [facility]   CC: Primary care physician; Idelle Crouch, MDPatient ID: Johnny Mccoy, male   DOB: 03-Aug-1945, 76 y.o.   MRN: [id number]

## 2021-03-28 ENCOUNTER — Encounter: Payer: Self-pay | Admitting: Orthopedic Surgery

## 2021-03-28 MED ORDER — OXYCODONE HCL 5 MG PO TABS: 5.0000 mg | ORAL_TABLET | Freq: Three times a day (TID) | ORAL | 0 refills | Status: DC | PRN

## 2021-03-28 MED ORDER — SULFAMETHOXAZOLE-TRIMETHOPRIM 800-160 MG PO TABS: 1.0000 | ORAL_TABLET | Freq: Two times a day (BID) | ORAL | 0 refills | Status: AC

## 2021-03-28 MED ORDER — POLYETHYLENE GLYCOL 3350 17 G PO PACK: 17.0000 g | PACK | Freq: Every day | ORAL | 0 refills | Status: DC | PRN

## 2021-03-28 MED ORDER — TORSEMIDE 20 MG PO TABS: 10.0000 mg | ORAL_TABLET | Freq: Every day | ORAL | 0 refills | Status: DC | PRN

## 2021-03-28 MED ORDER — BENZOCAINE 10 % MT GEL: Freq: Three times a day (TID) | OROMUCOSAL | 0 refills | Status: DC | PRN

## 2021-03-28 MED ORDER — ASPIRIN 325 MG PO TBEC: 325.0000 mg | DELAYED_RELEASE_TABLET | Freq: Every day | ORAL | 0 refills | Status: DC

## 2021-03-28 NOTE — Care Management Important Message (Signed)
Important Message  Patient Details  Name: Johnny Mccoy MRN: [id number] Date of Birth: Nov 04, 1945   Medicare Important Message Given:  N/A - LOS <3 / Initial given by admissions     Juliann Pulse A Arleny Kruger 03/28/2021, 9:57 AM

## 2021-03-28 NOTE — Discharge Summary (Signed)
[facility] NAME: Alphus Zeck    MR#:  [id number]  DATE OF BIRTH:  1945/06/20  DATE OF ADMISSION:  03/25/2021 ADMITTING PHYSICIAN: No admitting provider for patient encounter.  DATE OF DISCHARGE: 03/28/2021  PRIMARY CARE PHYSICIAN: Idelle Crouch, MD    ADMISSION DIAGNOSIS:  Confusion [R41.0] Delirium [R41.0] Sepsis ([place]) [A41.9]  DISCHARGE DIAGNOSIS:  Acute Metabolic Encephalopathy --resolved Right Knee arthroplasty with mild swelling  SECONDARY DIAGNOSIS:   Past Medical History:  Diagnosis Date   (HFpEF) heart failure with preserved ejection fraction ([place]) 02/27/2014   a.) TTE 02/27/14: EF >55%; mod LVH; mild panvalvular regurgitation; (+) global stress induced HK. b.) TTE 09/20/14: EF 55%; triv AR/MR, mild TR/PR; mild AS; BAE; c.) TTE 06/20/15: EF 55%, triv AR/MR; BAE; mild AS. d.) TTE 02/11/17: EF 55; significant  RV dilation with mod systolic dysfunction; mild AS. e.) TTE 02/27/19: EF 55%; mild AS. f.) TTE 06/04/20: EF 50%; mild AS   Anxiety    Aortic stenosis 09/20/2014   a.) TTE 09/20/2014: mild; MPG?. b.) TTE 06/20/2015: mild; MPG 8.0 mmHg. c.) TTE 02/11/2017: mild; MPG 12.4 mmHg. d.) TTE 02/27/2019: mild; MPG 11.4 mmHg. e.) TTE 06/04/2020: mild; MPG 9.6 mmHg.   Arthritis    Asthma    CAD (coronary artery disease)    Cataracts, bilateral    Chronic back pain    Chronic venous insufficiency    Complication of anesthesia    a.) postoperative amnesia (1999). b.) seizures after partial knee in 2014; sent to [facility] x 5 days d/t uncontrollable shaking and AMS; ?? fentanyl induced tremors. c.) anesthesia awareness   COVID-19 05/2018   CVA (cerebral vascular accident) [facility])    Depressed    Essential tremor    GERD (gastroesophageal reflux disease)    Hepatitis B    a.) h/o in the 90s; reports not active at present   History of bronchitis    Jan-Mar 2017   History of hiatal hernia    Hyperlipidemia    Hypertension     Insomnia    Joint pain    Lymphedema    Mild cognitive impairment    Muscle spasm    in neck.Takes Flexeril as needed   Numbness    right knee,right hand,and toes on left   RBBB    Seizures (HCC)    [place]. takes Lamotrigine daily. Last seizure 6+ yrs ago   Sleep apnea with use of continuous positive airway pressure (CPAP)    VTE (venous thromboembolism)    a.) s/p RIGHT TKA    HOSPITAL COURSE:  76 y.o male with significant PMH of HFpEF, CAD, CVA, HTN, HLD, seizures and VTE who  underwent right total knee arthroplasty revision on 03/10/2021 and per the wife, was noted to be confused and " out of his mind" since last night.     Acute metabolic encephalopathy suspected due to opioid side effects with underlying mild cognitive/dementia -- infectious etiology ruled out -- patient is meditating well -- no fever -- avoid large doses of opioids.   Recent right knee arthroplasty with mild serous discharge -- discussed with orthopedic Dr. Marry Guan-- okay with oral antibiotic. Change to PO Bactrim -- continue outpatient physical therapy -- follow-up with Dr. Marry Guan -- aspirin 325 mg daily for DVT prophylaxis for now -- CT right knee shows large effusion discussed with Dr. Marry Guan-- this likely is due to postop change.  --pt has Prevena wound vac placed by ortho today--f/u  in 1 week  chronic hyponatremia --Pt history of chronic hyponatremia for many years. -- Patient is euvolemic -- hold torsemide take as needed   Constipation -- stool softener and Dulcolax   seizure disorder -- continue lamotrigine       Procedures: Family communication : wife at bedside Consults : orthopedic CODE STATUS: full DVT Prophylaxis : aspirin Level of care: Telemetry Medical Status is: Inpatient   D/c home with resumption of out pt PT CONSULTS OBTAINED:  Treatment Team:  Poggi, Marshall Cork, MD Marry Guan Laurice Record, MD  DRUG ALLERGIES:   Allergies  Allergen Reactions   Tramadol Other (See  Comments)    CONTRAINDICATED PATIENT HAS HISTORY OF SEIZURE WILL LOWER SEIZURE THRESHOLD!!!!   Fentanyl Other (See Comments)    Possible involuntary tremors   Methocarbamol Other (See Comments)    confusion    DISCHARGE MEDICATIONS:   Allergies as of 03/28/2021       Reactions   Tramadol Other (See Comments)   CONTRAINDICATED PATIENT HAS HISTORY OF SEIZURE WILL LOWER SEIZURE THRESHOLD!!!!   Fentanyl Other (See Comments)   Possible involuntary tremors   Methocarbamol Other (See Comments)   confusion        Medication List     STOP taking these medications    diazepam 2 MG tablet Commonly known as: VALIUM   enoxaparin 40 MG/0.4ML injection Commonly known as: LOVENOX   traMADol 50 MG tablet Commonly known as: ULTRAM       TAKE these medications    albuterol 108 (90 Base) MCG/ACT inhaler Commonly known as: VENTOLIN HFA Inhale 2 puffs into the lungs every 4 (four) hours as needed for wheezing or shortness of breath.   aspirin 325 MG EC tablet Take 1 tablet (325 mg total) by mouth daily.   benzocaine 10 % mucosal gel Commonly known as: ORAJEL Use as directed in the mouth or throat 3 (three) times daily as needed for mouth pain.   Breo Ellipta 100-25 MCG/ACT Aepb Generic drug: fluticasone furoate-vilanterol Inhale 1 puff into the lungs daily.   budesonide 0.5 MG/2ML nebulizer solution Commonly known as: PULMICORT Take 0.5 mg by nebulization 2 (two) times daily as needed (shortness of breath).   celecoxib 200 MG capsule Commonly known as: CELEBREX Take 1 capsule (200 mg total) by mouth 2 (two) times daily.   cyanocobalamin 1000 MCG/ML injection Commonly known as: (VITAMIN B-12) Inject 1,000 mcg into the muscle every 30 (thirty) days.   DULoxetine 30 MG capsule Commonly known as: CYMBALTA Take 30 mg by mouth daily.   ergocalciferol 1.25 MG (50000 UT) capsule Commonly known as: VITAMIN D2 Take 50,000 Units by mouth every Tuesday.   gabapentin 400 MG  capsule Commonly known as: NEURONTIN Take 1 capsule (400 mg total) by mouth at bedtime.   lamoTRIgine 100 MG tablet Commonly known as: LAMICTAL Take 100 mg by mouth at bedtime.   lansoprazole 30 MG capsule Commonly known as: PREVACID Take 30 mg by mouth in the morning and at bedtime.   LORazepam 0.5 MG tablet Commonly known as: ATIVAN Take 0.5 mg by mouth 2 (two) times daily.   Magnesium 500 MG Caps Take 1 capsule by mouth daily in the afternoon.   memantine 28 MG Cp24 24 hr capsule Commonly known as: NAMENDA XR Take 1 capsule by mouth daily.   metoprolol succinate 50 MG 24 hr tablet Commonly known as: TOPROL-XL Take 50 mg by mouth 2 (two) times daily.   montelukast 10 MG tablet Commonly known as:  SINGULAIR Take 10 mg by mouth at bedtime.   naloxone 4 MG/0.1ML Liqd nasal spray kit Commonly known as: NARCAN Place 1 spray into the nose once.   nitroGLYCERIN 0.4 MG/SPRAY spray Commonly known as: NITROLINGUAL Place 2 sprays under the tongue every 5 (five) minutes x 3 doses as needed for chest pain.   nystatin-triamcinolone cream Commonly known as: MYCOLOG II Apply 1 application topically 2 (two) times daily.   olmesartan 40 MG tablet Commonly known as: BENICAR Take 40 mg by mouth daily.   oxyCODONE 5 MG immediate release tablet Commonly known as: Oxy IR/ROXICODONE Take 1 tablet (5 mg total) by mouth every 8 (eight) hours as needed for severe pain (pain score 4-6). What changed: when to take this   Ozempic (0.25 or 0.5 MG/DOSE) 2 MG/1.5ML Sopn Generic drug: Semaglutide(0.25 or 0.5MG/DOS) Inject 0.5 mg into the skin every Tuesday.   polyethylene glycol 17 g packet Commonly known as: MIRALAX / GLYCOLAX Take 17 g by mouth daily as needed for moderate constipation.   Red Yeast Rice 600 MG Caps Take 600 mg by mouth daily.   Repatha SureClick 390 MG/ML Soaj Generic drug: Evolocumab Inject 140 mg into the skin every 14 (fourteen) days.    sulfamethoxazole-trimethoprim 800-160 MG tablet Commonly known as: BACTRIM DS Take 1 tablet by mouth every 12 (twelve) hours for 6 days.   torsemide 20 MG tablet Commonly known as: DEMADEX Take 0.5 tablets (10 mg total) by mouth daily as needed. For leg edema What changed:  when to take this reasons to take this additional instructions   traZODone 100 MG tablet Commonly known as: DESYREL Take 100 mg by mouth at bedtime.   valACYclovir 1000 MG tablet Commonly known as: VALTREX Take 1,000 mg by mouth daily as needed (Fever blisters).               Discharge Care Instructions  (From admission, onward)           Start     Ordered   03/28/21 0000  Discharge wound care:       Comments: Cont prevana wound vac per instructions from Ortho   03/28/21 0803            If you experience worsening of your admission symptoms, develop shortness of breath, life threatening emergency, suicidal or homicidal thoughts you must seek medical attention immediately by calling 911 or calling your MD immediately  if symptoms less severe.  You Must read complete instructions/literature along with all the possible adverse reactions/side effects for all the Medicines you take and that have been prescribed to you. Take any new Medicines after you have completely understood and accept all the possible adverse reactions/side effects.   Please note  You were cared for by a hospitalist during your hospital stay. If you have any questions about your discharge medications or the care you received while you were in the hospital after you are discharged, you can call the unit and asked to speak with the hospitalist on call if the hospitalist that took care of you is not available. Once you are discharged, your primary care physician will handle any further medical issues. Please note that NO REFILLS for any discharge medications will be authorized once you are discharged, as it is imperative that you  return to your primary care physician (or establish a relationship with a primary care physician if you do not have one) for your aftercare needs so that they can reassess your need for medications  and monitor your lab values. Today   SUBJECTIVE   Doing well  VITAL SIGNS:  Blood pressure 105/73, pulse 65, temperature 98.5 F (36.9 C), resp. rate 18, height 5' 7"  (1.702 m), weight 111.2 kg, SpO2 96 %.  I/O:   Intake/Output Summary (Last 24 hours) at 03/28/2021 0804 Last data filed at 03/27/2021 1300 Gross per 24 hour  Intake 420 ml  Output 825 ml  Net -405 ml    PHYSICAL EXAMINATION:  GENERAL:  76 y.o.-year-old patient lying in the bed with no acute distress.  LUNGS: Normal breath sounds bilaterally, no wheezing, rales,rhonchi or crepitation.  CARDIOVASCULAR: S1, S2 normal. No murmurs, rubs, or gallops.  ABDOMEN: Soft, non-tender, non-distended. Bowel sounds present.  EXTREMITIES: No  clubbing. Right knee Prevena wound vac NEUROLOGIC: non-focal PSYCHIATRIC:  patient is alert and awake  SKIN: No obvious rash, lesion, or ulcer.   DATA REVIEW:   CBC  Recent Labs  Lab 03/26/21 0233  WBC 9.2  HGB 9.4*  HCT 26.3*  PLT 139*    Chemistries  Recent Labs  Lab 03/25/21 1535 03/25/21 2230 03/26/21 0233 03/26/21 0625 03/27/21 0202 03/27/21 0559 03/27/21 0946  NA 121*  --  126*   < > 128*   < > 127*  K 4.7  --  4.5  --   --   --   --   CL 83*  --  96*  --   --   --   --   CO2 27  --  25  --   --   --   --   GLUCOSE 124*  --  113*  --   --   --   --   BUN 22  --  16  --   --   --   --   CREATININE 1.64*   < > 1.11  --  0.86  --   --   CALCIUM 9.3  --  8.0*  --   --   --   --   MG  --   --  1.8  --   --   --   --   AST 19  --   --   --   --   --   --   ALT 32  --   --   --   --   --   --   ALKPHOS 58  --   --   --   --   --   --   BILITOT 1.2  --   --   --   --   --   --    < > = values in this interval not displayed.    Microbiology Results   Recent Results (from  the past 240 hour(s))  Culture, blood (routine x 2)     Status: None (Preliminary result)   Collection Time: 03/25/21  3:36 PM   Specimen: BLOOD  Result Value Ref Range Status   Specimen Description BLOOD BLOOD RIGHT ARM  Final   Special Requests BOTTLES DRAWN AEROBIC AND ANAEROBIC BCAV  Final   Culture   Final    NO GROWTH 3 DAYS Performed at [facility], [address] [place], [place] [address]    Report Status PENDING  Incomplete  Resp Panel by RT-PCR (Flu A&B, Covid) Nasopharyngeal Swab     Status: None   Collection Time: 03/25/21  4:18 PM   Specimen: Nasopharyngeal Swab; Nasopharyngeal(NP) swabs in vial transport  medium  Result Value Ref Range Status   SARS Coronavirus 2 by RT PCR NEGATIVE NEGATIVE Final    Comment: (NOTE) SARS-CoV-2 target nucleic acids are NOT DETECTED.  The SARS-CoV-2 RNA is generally detectable in upper respiratory specimens during the acute phase of infection. The lowest concentration of SARS-CoV-2 viral copies this assay can detect is 138 copies/mL. A negative result does not preclude SARS-Cov-2 infection and should not be used as the sole basis for treatment or other patient management decisions. A negative result may occur with  improper specimen collection/handling, submission of specimen other than nasopharyngeal swab, presence of viral mutation(s) within the areas targeted by this assay, and inadequate number of viral copies(<138 copies/mL). A negative result must be combined with clinical observations, patient history, and epidemiological information. The expected result is Negative.  Fact Sheet for Patients:  EntrepreneurPulse.com.au  Fact Sheet for Healthcare Providers:  IncredibleEmployment.be  This test is no t yet approved or cleared by the [place] FDA and  has been authorized for detection and/or diagnosis of SARS-CoV-2 by FDA under an Emergency Use Authorization (EUA). This EUA will  remain  in effect (meaning this test can be used) for the duration of the COVID-19 declaration under Section 564(b)(1) of the Act, 21 U.S.C.section 360bbb-3(b)(1), unless the authorization is terminated  or revoked sooner.       Influenza A by PCR NEGATIVE NEGATIVE Final   Influenza B by PCR NEGATIVE NEGATIVE Final    Comment: (NOTE) The Xpert Xpress SARS-CoV-2/FLU/RSV plus assay is intended as an aid in the diagnosis of influenza from Nasopharyngeal swab specimens and should not be used as a sole basis for treatment. Nasal washings and aspirates are unacceptable for Xpert Xpress SARS-CoV-2/FLU/RSV testing.  Fact Sheet for Patients: EntrepreneurPulse.com.au  Fact Sheet for Healthcare Providers: IncredibleEmployment.be  This test is not yet approved or cleared by the [place] FDA and has been authorized for detection and/or diagnosis of SARS-CoV-2 by FDA under an Emergency Use Authorization (EUA). This EUA will remain in effect (meaning this test can be used) for the duration of the COVID-19 declaration under Section 564(b)(1) of the Act, 21 U.S.C. section 360bbb-3(b)(1), unless the authorization is terminated or revoked.  Performed at [facility], [address] [place], [place] [address]   Urine Culture     Status: None   Collection Time: 03/25/21 10:08 PM   Specimen: Urine, Catheterized  Result Value Ref Range Status   Specimen Description   Final    URINE, CATHETERIZED Performed at [facility], [address] [place], [place] [address]    Special Requests   Final    NONE Performed at [facility], [address], [place], [place] [address]    Culture   Final    NO GROWTH Performed at [facility], [place] [address] [place], [place] [address]    Report Status 03/27/2021 FINAL  Final  Culture, blood (Routine X 2) w Reflex to ID Panel     Status: None (Preliminary result)   Collection  Time: 03/26/21  1:02 AM   Specimen: BLOOD  Result Value Ref Range Status   Specimen Description BLOOD LEFT HAND  Final   Special Requests   Final    BOTTLES DRAWN AEROBIC AND ANAEROBIC Blood Culture results may not be optimal due to an inadequate volume of blood received in culture bottles   Culture   Final    NO GROWTH 2 DAYS Performed at [facility], [place]  [address] [place], [place] [address]    Report Status PENDING  Incomplete    RADIOLOGY:  ECHOCARDIOGRAM COMPLETE  Result Date: 03/27/2021    ECHOCARDIOGRAM REPORT   Patient Name:   MATHIAS BOGACKI Maiolo Date of Exam: 03/26/2021 Medical Rec #:  [id number]      Height:       67.0 in Accession #:    [id number]     Weight:       233.5 lb Date of Birth:  06/15/1945       BSA:          2.160 m Patient Age:    74 years       BP:           108/48 mmHg Patient Gender: M              HR:           80 bpm. Exam Location:  [facility] Procedure: 2D Echo, Color Doppler, Cardiac Doppler and Intracardiac            Opacification Agent Indications:     I50.31 congestive heart failure-Acute Diastolic  History:         Patient has no prior history of Echocardiogram examinations.                  HFpEF, CAD, Stroke; Risk Factors:Hypertension, Dyslipidemia and                  Sleep Apnea.  Sonographer:     Charmayne Sheer Referring Phys:  [place] Diagnosing Phys: Yolonda Kida MD  Sonographer Comments: Suboptimal apical window and no subcostal window. IMPRESSIONS  1. Left ventricular ejection fraction, by estimation, is 55 to 60%. The left ventricle has normal function. The left ventricle has no regional wall motion abnormalities. Left ventricular diastolic parameters are consistent with Grade I diastolic dysfunction (impaired relaxation).  2. Right ventricular systolic function is normal. The right ventricular size is mildly enlarged.  3. Left atrial size was mildly dilated.  4. Right atrial size was mildly dilated.  5. The mitral valve is normal in  structure. Trivial mitral valve regurgitation.  6. The aortic valve is normal in structure. There is moderate calcification of the aortic valve. There is mild thickening of the aortic valve. Aortic valve regurgitation is not visualized. Mild aortic valve stenosis. FINDINGS  Left Ventricle: Left ventricular ejection fraction, by estimation, is 55 to 60%. The left ventricle has normal function. The left ventricle has no regional wall motion abnormalities. Definity contrast agent was given IV to delineate the left ventricular  endocardial borders. The left ventricular internal cavity size was normal in size. There is no left ventricular hypertrophy. Left ventricular diastolic parameters are consistent with Grade I diastolic dysfunction (impaired relaxation). Right Ventricle: The right ventricular size is mildly enlarged. No increase in right ventricular wall thickness. Right ventricular systolic function is normal. Left Atrium: Left atrial size was mildly dilated. Right Atrium: Right atrial size was mildly dilated. Pericardium: There is no evidence of pericardial effusion. Mitral Valve: The mitral valve is normal in structure. There is mild thickening of the mitral valve leaflet(s). There is mild calcification of the mitral valve leaflet(s). Mild to moderate mitral annular calcification. Trivial mitral valve regurgitation.  MV peak gradient, 5.8 mmHg. The mean mitral valve gradient is 2.0 mmHg. Tricuspid Valve: The tricuspid valve is normal in structure. Tricuspid valve regurgitation is trivial. Aortic Valve: The aortic valve is normal in structure. There  is moderate calcification of the aortic valve. There is mild thickening of the aortic valve. Aortic valve regurgitation is not visualized. Mild aortic stenosis is present. Aortic valve mean gradient measures 10.0 mmHg. Aortic valve peak gradient measures 20.1 mmHg. Aortic valve area, by VTI measures 1.40 cm. Pulmonic Valve: The pulmonic valve was normal in structure.  Pulmonic valve regurgitation is not visualized. Aorta: The ascending aorta was not well visualized. IAS/Shunts: No atrial level shunt detected by color flow Doppler.  LEFT VENTRICLE PLAX 2D LVIDd:         4.68 cm   Diastology LVIDs:         3.29 cm   LV e' medial:    5.66 cm/s LV PW:         1.28 cm   LV E/e' medial:  16.3 LV IVS:        1.04 cm   LV e' lateral:   9.25 cm/s LVOT diam:     1.90 cm   LV E/e' lateral: 10.0 LV SV:         50 LV SV Index:   23 LVOT Area:     2.84 cm  RIGHT VENTRICLE RV Basal diam:  4.65 cm LEFT ATRIUM         Index       RIGHT ATRIUM           Index LA diam:    4.00 cm 1.85 cm/m  RA Area:     21.00 cm                                 RA Volume:   55.20 ml  25.56 ml/m  AORTIC VALVE                     PULMONIC VALVE AV Area (Vmax):    1.09 cm      PV Vmax:       1.22 m/s AV Area (Vmean):   1.25 cm      PV Vmean:      84.200 cm/s AV Area (VTI):     1.40 cm      PV VTI:        0.203 m AV Vmax:           224.25 cm/s   PV Peak grad:  6.0 mmHg AV Vmean:          147.500 cm/s  PV Mean grad:  3.0 mmHg AV VTI:            0.356 m AV Peak Grad:      20.1 mmHg AV Mean Grad:      10.0 mmHg LVOT Vmax:         86.20 cm/s LVOT Vmean:        65.100 cm/s LVOT VTI:          0.175 m LVOT/AV VTI ratio: 0.49  AORTA Ao Root diam: 3.70 cm MITRAL VALVE                TRICUSPID VALVE MV Area (PHT): 3.39 cm     TR Peak grad:   30.7 mmHg MV Area VTI:   1.51 cm     TR Vmax:        277.00 cm/s MV Peak grad:  5.8 mmHg MV Mean grad:  2.0 mmHg     SHUNTS MV Vmax:       1.20 m/s  Systemic VTI:  0.18 m MV Vmean:      74.4 cm/s    Systemic Diam: 1.90 cm MV Decel Time: 224 msec MV E velocity: 92.10 cm/s MV A velocity: 104.00 cm/s MV E/A ratio:  0.89 Dwayne D Callwood MD Electronically signed by Yolonda Kida MD Signature Date/Time: 03/27/2021/8:29:35 AM    Final      CODE STATUS:     Code Status Orders  (From admission, onward)           Start     Ordered   03/25/21 2207  Full code  Continuous         03/25/21 2207           Code Status History     Date Active Date Inactive Code Status Order ID Comments User Context   03/10/2021 2057 03/12/2021 1652 Full Code [id number]  Dereck Leep, MD Inpatient   06/10/2020 1849 06/12/2020 1518 Full Code [id number]  Marcelyn Bruins, MD ED   04/12/2019 1422 04/13/2019 1158 Full Code [id number]  Corey Skains, MD Inpatient   10/28/2015 1006 10/31/2015 1453 Full Code [id number]  Greysen Giovanni, PA-C Inpatient        TOTAL TIME TAKING CARE OF THIS PATIENT: 35 minutes.    Fritzi Mandes M.D  [facility]    CC: Primary care physician; Idelle Crouch, MD

## 2021-03-28 NOTE — Discharge Instructions (Signed)
Cont the right knee wound vac per Ortho instructions Cont your OUT PATIENT PT

## 2021-03-28 NOTE — Progress Notes (Addendum)
°  Subjective:       AMS S/p Revision R knee arthroplasty 03/10/21 Patient reports pain as well-controlled this AM.  Patient is well, and has had no acute complaints or problems Plan is to go Home after hospital stay. Negative for chest pain and shortness of breath Fever: no Gastrointestinal: negative for nausea and vomiting.  Objective: Vital signs in last 24 hours: Temp:  [97.8 F (36.6 C)-98.5 F (36.9 C)] 98.5 F (36.9 C) (01/27 0746) Pulse Rate:  [65-99] 65 (01/27 0746) Resp:  [16-18] 18 (01/27 0746) BP: (105-142)/(65-73) 105/73 (01/27 0746) SpO2:  [95 %-98 %] 96 % (01/27 0746)  Intake/Output from previous day:  Intake/Output Summary (Last 24 hours) at 03/28/2021 0938 Last data filed at 03/27/2021 1300 Gross per 24 hour  Intake 180 ml  Output 500 ml  Net -320 ml    Intake/Output this shift: No intake/output data recorded.  Labs: Recent Labs    03/25/21 1535 03/25/21 2230 03/26/21 0233  HGB 11.5* 9.4* 9.4*   Recent Labs    03/25/21 2230 03/26/21 0233  WBC 8.7 9.2  RBC 2.80* 2.76*  HCT 26.8* 26.3*  PLT 129* 139*   Recent Labs    03/25/21 1535 03/25/21 2230 03/26/21 0233 03/26/21 0625 03/27/21 0202 03/27/21 0559 03/27/21 0946  NA 121*  --  126*   < > 128* 129* 127*  K 4.7  --  4.5  --   --   --   --   CL 83*  --  96*  --   --   --   --   CO2 27  --  25  --   --   --   --   BUN 22  --  16  --   --   --   --   CREATININE 1.64*   < > 1.11  --  0.86  --   --   GLUCOSE 124*  --  113*  --   --   --   --   CALCIUM 9.3  --  8.0*  --   --   --   --    < > = values in this interval not displayed.   No results for input(s): LABPT, INR in the last 72 hours.   EXAM General - Patient is Alert, Appropriate, and Oriented Extremity - Neurovascular intact Dorsiflexion/Plantar flexion intact Compartment soft Dressing/Incision -mild drainage noted at midpoint of the incision after removal of ACE wrap, minimal active drainage noted  Motor Function - intact,  moving foot and toes well on exam.      Assessment/Plan:    Principal Problem:   Sepsis ([place]) Active Problems:   Acute metabolic encephalopathy   Right knee pain  Estimated body mass index is 38.4 kg/m as calculated from the following:   Height as of this encounter: 5\' 7"  (1.702 m).   Weight as of this encounter: 111.2 kg. Advance diet Up with therapy  Plan for discharge later today.   Prevena WoundVac applied to R knee. Discussed instructions for operation with patient and his wife. Stressed need for unit to be plugged in when in the home. Extra cannister provided.   Patient to f/u in 1 week with [place] ortho  DVT Prophylaxis - TED hose, ASA 325 qd Weight-Bearing as tolerated to right leg  Cassell Smiles, PA-C [facility] 03/28/2021, 9:38 AM

## 2021-03-28 NOTE — Progress Notes (Signed)
Discharge instructions given and went over with patient at bedside. All questions answered. Patient discharged home. Orma Flaming, RN

## 2021-03-30 LAB — CULTURE, BLOOD (ROUTINE X 2): Culture: NO GROWTH

## 2021-03-31 LAB — CULTURE, BLOOD (ROUTINE X 2): Culture: NO GROWTH

## 2021-04-03 ENCOUNTER — Inpatient Hospital Stay
Admission: EM | Admit: 2021-04-03 | Discharge: 2021-04-06 | DRG: 644 | Disposition: A | Discharge status: Home / Self Care | Payer: Medicare Other | Attending: Obstetrics and Gynecology | Admitting: Obstetrics and Gynecology

## 2021-04-03 ENCOUNTER — Encounter: Payer: Self-pay | Admitting: Emergency Medicine

## 2021-04-03 ENCOUNTER — Inpatient Hospital Stay: Payer: Medicare Other

## 2021-04-03 ENCOUNTER — Emergency Department: Payer: Medicare Other

## 2021-04-03 ENCOUNTER — Other Ambulatory Visit: Payer: Self-pay

## 2021-04-03 DIAGNOSIS — N179 Acute kidney failure, unspecified: Secondary | ICD-10-CM | POA: Diagnosis present

## 2021-04-03 DIAGNOSIS — I10 Essential (primary) hypertension: Secondary | ICD-10-CM | POA: Diagnosis present

## 2021-04-03 DIAGNOSIS — I5032 Chronic diastolic (congestive) heart failure: Secondary | ICD-10-CM | POA: Diagnosis present

## 2021-04-03 DIAGNOSIS — Z79899 Other long term (current) drug therapy: Secondary | ICD-10-CM | POA: Diagnosis not present

## 2021-04-03 DIAGNOSIS — G4733 Obstructive sleep apnea (adult) (pediatric): Secondary | ICD-10-CM | POA: Diagnosis present

## 2021-04-03 DIAGNOSIS — G40A09 Absence epileptic syndrome, not intractable, without status epilepticus: Secondary | ICD-10-CM | POA: Diagnosis present

## 2021-04-03 DIAGNOSIS — E222 Syndrome of inappropriate secretion of antidiuretic hormone: Principal | ICD-10-CM | POA: Diagnosis present

## 2021-04-03 DIAGNOSIS — E860 Dehydration: Secondary | ICD-10-CM | POA: Diagnosis present

## 2021-04-03 DIAGNOSIS — Z96651 Presence of right artificial knee joint: Secondary | ICD-10-CM | POA: Diagnosis present

## 2021-04-03 DIAGNOSIS — Z825 Family history of asthma and other chronic lower respiratory diseases: Secondary | ICD-10-CM | POA: Diagnosis not present

## 2021-04-03 DIAGNOSIS — K219 Gastro-esophageal reflux disease without esophagitis: Secondary | ICD-10-CM | POA: Diagnosis present

## 2021-04-03 DIAGNOSIS — Z8249 Family history of ischemic heart disease and other diseases of the circulatory system: Secondary | ICD-10-CM

## 2021-04-03 DIAGNOSIS — E871 Hypo-osmolality and hyponatremia: Secondary | ICD-10-CM | POA: Diagnosis present

## 2021-04-03 DIAGNOSIS — Z7951 Long term (current) use of inhaled steroids: Secondary | ICD-10-CM | POA: Diagnosis not present

## 2021-04-03 DIAGNOSIS — I959 Hypotension, unspecified: Secondary | ICD-10-CM | POA: Diagnosis present

## 2021-04-03 DIAGNOSIS — Z809 Family history of malignant neoplasm, unspecified: Secondary | ICD-10-CM | POA: Diagnosis not present

## 2021-04-03 DIAGNOSIS — Z8616 Personal history of COVID-19: Secondary | ICD-10-CM

## 2021-04-03 DIAGNOSIS — F329 Major depressive disorder, single episode, unspecified: Secondary | ICD-10-CM | POA: Diagnosis present

## 2021-04-03 DIAGNOSIS — I110 Hypertensive heart disease with heart failure: Secondary | ICD-10-CM | POA: Diagnosis present

## 2021-04-03 DIAGNOSIS — I2510 Atherosclerotic heart disease of native coronary artery without angina pectoris: Secondary | ICD-10-CM | POA: Diagnosis present

## 2021-04-03 DIAGNOSIS — I4510 Unspecified right bundle-branch block: Secondary | ICD-10-CM | POA: Diagnosis present

## 2021-04-03 DIAGNOSIS — R600 Localized edema: Secondary | ICD-10-CM

## 2021-04-03 DIAGNOSIS — Z7982 Long term (current) use of aspirin: Secondary | ICD-10-CM | POA: Diagnosis not present

## 2021-04-03 DIAGNOSIS — G250 Essential tremor: Secondary | ICD-10-CM | POA: Diagnosis present

## 2021-04-03 DIAGNOSIS — Z86711 Personal history of pulmonary embolism: Secondary | ICD-10-CM

## 2021-04-03 DIAGNOSIS — E785 Hyperlipidemia, unspecified: Secondary | ICD-10-CM | POA: Diagnosis present

## 2021-04-03 DIAGNOSIS — R569 Unspecified convulsions: Secondary | ICD-10-CM

## 2021-04-03 DIAGNOSIS — Z823 Family history of stroke: Secondary | ICD-10-CM | POA: Diagnosis not present

## 2021-04-03 DIAGNOSIS — F411 Generalized anxiety disorder: Secondary | ICD-10-CM | POA: Diagnosis present

## 2021-04-03 DIAGNOSIS — G609 Hereditary and idiopathic neuropathy, unspecified: Secondary | ICD-10-CM | POA: Diagnosis present

## 2021-04-03 DIAGNOSIS — Z8673 Personal history of transient ischemic attack (TIA), and cerebral infarction without residual deficits: Secondary | ICD-10-CM | POA: Diagnosis not present

## 2021-04-03 DIAGNOSIS — Z86718 Personal history of other venous thrombosis and embolism: Secondary | ICD-10-CM

## 2021-04-03 DIAGNOSIS — Z955 Presence of coronary angioplasty implant and graft: Secondary | ICD-10-CM

## 2021-04-03 LAB — BASIC METABOLIC PANEL
Anion gap: 7 (ref 5–15)
BUN: 17 mg/dL (ref 8–23)
CO2: 23 mmol/L (ref 22–32)
Calcium: 8.8 mg/dL — ABNORMAL LOW (ref 8.9–10.3)
Chloride: 88 mmol/L — ABNORMAL LOW (ref 98–111)
Creatinine, Ser: 1.31 mg/dL — ABNORMAL HIGH (ref 0.61–1.24)
GFR, Estimated: 57 mL/min — ABNORMAL LOW (ref >60)
Glucose, Bld: 138 mg/dL — ABNORMAL HIGH (ref 70–99)
Potassium: 4.9 mmol/L (ref 3.5–5.1)
Sodium: 118 mmol/L — CL (ref 135–145)

## 2021-04-03 LAB — OSMOLALITY: Osmolality: 253 mOsm/kg — ABNORMAL LOW (ref 275–295)

## 2021-04-03 LAB — RESP PANEL BY RT-PCR (FLU A&B, COVID) ARPGX2
Influenza A by PCR: NEGATIVE
Influenza B by PCR: NEGATIVE
SARS Coronavirus 2 by RT PCR: NEGATIVE

## 2021-04-03 LAB — OSMOLALITY, URINE: Osmolality, Ur: 262 mOsm/kg — ABNORMAL LOW (ref 300–900)

## 2021-04-03 LAB — SODIUM, URINE, RANDOM: Sodium, Ur: 54 mmol/L

## 2021-04-03 LAB — TROPONIN I (HIGH SENSITIVITY)
Troponin I (High Sensitivity): 26 ng/L — ABNORMAL HIGH (ref <18)
Troponin I (High Sensitivity): 23 ng/L — ABNORMAL HIGH (ref <18)

## 2021-04-03 LAB — BRAIN NATRIURETIC PEPTIDE: B Natriuretic Peptide: 54.5 pg/mL (ref 0.0–100.0)

## 2021-04-03 LAB — TSH: TSH: 3.905 u[IU]/mL (ref 0.350–4.500)

## 2021-04-03 MED ORDER — ALBUTEROL SULFATE HFA 108 (90 BASE) MCG/ACT IN AERS: 2.0000 | INHALATION_SPRAY | RESPIRATORY_TRACT | Status: DC | PRN

## 2021-04-03 MED ORDER — NITROGLYCERIN 0.4 MG/SPRAY TL SOLN
2.0000 | Status: DC | PRN
  Filled 2021-04-03: qty 4.9

## 2021-04-03 MED ORDER — SODIUM CHLORIDE 0.9 % IV BOLUS
1000.0000 mL | Freq: Once | INTRAVENOUS | Status: AC
  Administered 2021-04-03: 1000 mL via INTRAVENOUS

## 2021-04-03 MED ORDER — DULOXETINE HCL 30 MG PO CPEP
30.0000 mg | ORAL_CAPSULE | Freq: Every day | ORAL | Status: DC
  Administered 2021-04-04 – 2021-04-05 (×2): 30 mg via ORAL
  Filled 2021-04-03 (×3): qty 1

## 2021-04-03 MED ORDER — MONTELUKAST SODIUM 10 MG PO TABS
10.0000 mg | ORAL_TABLET | Freq: Every day | ORAL | Status: DC
  Administered 2021-04-03 – 2021-04-05 (×3): 10 mg via ORAL
  Filled 2021-04-03 (×3): qty 1

## 2021-04-03 MED ORDER — FLUTICASONE FUROATE-VILANTEROL 100-25 MCG/ACT IN AEPB
1.0000 | INHALATION_SPRAY | Freq: Every day | RESPIRATORY_TRACT | Status: DC
  Administered 2021-04-05 – 2021-04-06 (×2): 1 via RESPIRATORY_TRACT
  Filled 2021-04-03 (×2): qty 28

## 2021-04-03 MED ORDER — LAMOTRIGINE 100 MG PO TABS
100.0000 mg | ORAL_TABLET | Freq: Every day | ORAL | Status: DC
  Administered 2021-04-03 – 2021-04-05 (×3): 100 mg via ORAL
  Filled 2021-04-03 (×3): qty 1

## 2021-04-03 MED ORDER — ACETAMINOPHEN 650 MG RE SUPP: 650.0000 mg | Freq: Four times a day (QID) | RECTAL | Status: DC | PRN

## 2021-04-03 MED ORDER — HYDRALAZINE HCL 20 MG/ML IJ SOLN: 10.0000 mg | Freq: Four times a day (QID) | INTRAMUSCULAR | Status: DC | PRN

## 2021-04-03 MED ORDER — ONDANSETRON HCL 4 MG/2ML IJ SOLN: 4.0000 mg | Freq: Four times a day (QID) | INTRAMUSCULAR | Status: DC | PRN

## 2021-04-03 MED ORDER — METOPROLOL SUCCINATE ER 50 MG PO TB24: 50.0000 mg | ORAL_TABLET | Freq: Two times a day (BID) | ORAL | Status: DC

## 2021-04-03 MED ORDER — ONDANSETRON HCL 4 MG PO TABS: 4.0000 mg | ORAL_TABLET | Freq: Four times a day (QID) | ORAL | Status: DC | PRN

## 2021-04-03 MED ORDER — HEPARIN SODIUM (PORCINE) 5000 UNIT/ML IJ SOLN
5000.0000 [IU] | Freq: Three times a day (TID) | INTRAMUSCULAR | Status: DC
  Administered 2021-04-03 – 2021-04-05 (×5): 5000 [IU] via SUBCUTANEOUS
  Filled 2021-04-03 (×5): qty 1

## 2021-04-03 MED ORDER — BUDESONIDE 0.5 MG/2ML IN SUSP: 0.5000 mg | Freq: Two times a day (BID) | RESPIRATORY_TRACT | Status: DC | PRN

## 2021-04-03 MED ORDER — ACETAMINOPHEN 325 MG PO TABS
650.0000 mg | ORAL_TABLET | Freq: Four times a day (QID) | ORAL | Status: DC | PRN
  Administered 2021-04-04 – 2021-04-05 (×4): 650 mg via ORAL
  Filled 2021-04-03 (×4): qty 2

## 2021-04-03 MED ORDER — ASPIRIN 81 MG PO CHEW: 324.0000 mg | CHEWABLE_TABLET | Freq: Once | ORAL | Status: DC

## 2021-04-03 MED ORDER — PANTOPRAZOLE SODIUM 40 MG PO TBEC
40.0000 mg | DELAYED_RELEASE_TABLET | Freq: Every day | ORAL | Status: DC
  Administered 2021-04-04 – 2021-04-06 (×3): 40 mg via ORAL
  Filled 2021-04-03 (×3): qty 1

## 2021-04-03 MED ORDER — ALBUTEROL SULFATE (2.5 MG/3ML) 0.083% IN NEBU: 2.5000 mg | INHALATION_SOLUTION | RESPIRATORY_TRACT | Status: DC | PRN

## 2021-04-03 NOTE — ED Triage Notes (Signed)
Pt comes into the ED via POV c/o abnormal labs.  Pt states that his Na is down to 117 and his K+ was elevated.  Pt explains that he is having intense lethargy at this time.  Pt does admit that his chest was hurting earlier today but denies it hurting currently.  Pt has even and unlabored respirations at this time.

## 2021-04-03 NOTE — H&P (Signed)
History and Physical    Patient: Johnny Mccoy XHB:[id number] DOB: 1945/05/03 DOA: 04/03/2021 DOS: the patient was seen and examined on 04/03/2021 PCP: Idelle Crouch, MD  Patient coming from: Home  Chief Complaint:  Chief Complaint  Patient presents with   Abnormal Lab    HPI: Johnny Mccoy is a 76 y.o. male with medical history significant of presenting with hyponatremia and has been sob and also has been having chest pain.    Review of Systems:  Review of Systems  Respiratory:  Positive for shortness of breath.   Cardiovascular:  Positive for chest pain and leg swelling.  All other systems reviewed and are negative.  Past Medical History:  Diagnosis Date   (HFpEF) heart failure with preserved ejection fraction ([place]) 02/27/2014   a.) TTE 02/27/14: EF >55%; mod LVH; mild panvalvular regurgitation; (+) global stress induced HK. b.) TTE 09/20/14: EF 55%; triv AR/MR, mild TR/PR; mild AS; BAE; c.) TTE 06/20/15: EF 55%, triv AR/MR; BAE; mild AS. d.) TTE 02/11/17: EF 55; significant  RV dilation with mod systolic dysfunction; mild AS. e.) TTE 02/27/19: EF 55%; mild AS. f.) TTE 06/04/20: EF 50%; mild AS   Anxiety    Aortic stenosis 09/20/2014   a.) TTE 09/20/2014: mild; MPG?. b.) TTE 06/20/2015: mild; MPG 8.0 mmHg. c.) TTE 02/11/2017: mild; MPG 12.4 mmHg. d.) TTE 02/27/2019: mild; MPG 11.4 mmHg. e.) TTE 06/04/2020: mild; MPG 9.6 mmHg.   Arthritis    Asthma    CAD (coronary artery disease)    Cataracts, bilateral    Chronic back pain    Chronic venous insufficiency    Complication of anesthesia    a.) postoperative amnesia (1999). b.) seizures after partial knee in 2014; sent to [facility] x 5 days d/t uncontrollable shaking and AMS; ?? fentanyl induced tremors. c.) anesthesia awareness   COVID-19 05/2018   CVA (cerebral vascular accident) [facility])    Depressed    Essential tremor    GERD (gastroesophageal reflux disease)    Hepatitis B    a.) h/o in the 90s; reports not active at present    History of bronchitis    Jan-Mar 2017   History of hiatal hernia    Hyperlipidemia    Hypertension    Insomnia    Joint pain    Lymphedema    Mild cognitive impairment    Muscle spasm    in neck.Takes Flexeril as needed   Numbness    right knee,right hand,and toes on left   RBBB    Seizures (HCC)    [place]. takes Lamotrigine daily. Last seizure 6+ yrs ago   Sleep apnea with use of continuous positive airway pressure (CPAP)    VTE (venous thromboembolism)    a.) s/p RIGHT TKA   Past Surgical History:  Procedure Laterality Date   ANKLE ARTHROSCOPY Left 03/07/2020   Procedure: ANKLE ARTHROSCOPY;  Surgeon: Caroline More, DPM;  Location: [place];  Service: Podiatry;  Laterality: Left;   BACK SURGERY     x3   BLEPHAROPLASTY     CARDIAC CATHETERIZATION     2015   CARPAL TUNNEL RELEASE     COLONOSCOPY     CORONARY ANGIOPLASTY WITH STENT PLACEMENT     x 3   CORONARY ANGIOPLASTY WITH STENT PLACEMENT Left 01/30/2003   Procedure: CORONARY ANGIOPLASTY WITH STENT PLACEMENT (2.75 x 32 mm Taxus stent to RCA); Location: [facility]; Surgeon: Courtney Paris, MD   dental implants     ESOPHAGOGASTRODUODENOSCOPY  EYE SURGERY     both eyes in 60's d/t muscle causing to squint   IVC FILTER INSERTION N/A 03/04/2021   Procedure: IVC FILTER INSERTION;  Surgeon: Katha Cabal, MD;  Location: [place] [facility];  Service: Cardiovascular;  Laterality: N/A;   JOINT REPLACEMENT Right    knee   KNEE ARTHROSCOPY     LEFT HEART CATH AND CORONARY ANGIOGRAPHY Left 04/12/2019   Procedure: LEFT HEART CATH AND CORONARY ANGIOGRAPHY;  Surgeon: Corey Skains, MD;  Location: [place] [facility];  Service: Cardiovascular;  Laterality: Left;   MEDIAL PARTIAL KNEE REPLACEMENT Right    MEDIASTERNOTOMY N/A 10/28/2015   Procedure: PARTIAL STERNOTOMY;  Surgeon: [name], MD;  Location: [place];  Service: Thoracic;  Laterality: N/A;   numerous hand surgery     ORIF ANKLE FRACTURE Left  03/07/2020   Procedure: OPEN REDUCTION INTERNAL FIXATION (ORIF) ANKLE FRACTURE;  Surgeon: Caroline More, DPM;  Location: [place];  Service: Podiatry;  Laterality: Left;   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   Procedure: RESECTION OF anterier MEDIASTINAL TUMOR;  Surgeon: [name], MD;  Location: [place];  Service: Thoracic;  Laterality: N/A;   right hand fusion     rotorblator  03/1997   anteriograde amnesia resulted   TENDON REPAIR Left 03/07/2020   Procedure: FLEXOR TENDON REPAIR;  Surgeon: Caroline More, DPM;  Location: [place];  Service: Podiatry;  Laterality: Left;  sleep apnea   TONSILLECTOMY     TOTAL KNEE REVISION Right 03/10/2021   Procedure: TOTAL KNEE REVISION;  Surgeon: Dereck Leep, MD;  Location: [facility];  Service: Orthopedics;  Laterality: Right;   UMBILICAL HERNIA REPAIR     Social History:  reports that he has never smoked. He has never used smokeless tobacco. He reports current alcohol use of about 14.0 standard drinks per week. He reports that he does not use drugs.  Allergies  Allergen Reactions   Tramadol Other (See Comments)    CONTRAINDICATED PATIENT HAS HISTORY OF SEIZURE WILL LOWER SEIZURE THRESHOLD!!!!   Oxycodone     Other reaction(s): Hallucination   Fentanyl Other (See Comments)    Possible involuntary tremors   Methocarbamol Other (See Comments)    confusion    Family History  Problem Relation Age of Onset   Cancer Father 68       Bone    COPD Father    Heart disease Mother    Hyperlipidemia Mother    Stroke Mother    Heart disease Maternal Grandfather    Hyperlipidemia Maternal Grandfather     Prior to Admission medications   Medication Sig Start Date End Date Taking? Authorizing Provider  albuterol (VENTOLIN HFA) 108 (90 Base) MCG/ACT inhaler Inhale 2 puffs into the lungs every 4 (four) hours as needed for wheezing or shortness of breath.    [provider]  aspirin EC 325 MG EC tablet Take 1  tablet (325 mg total) by mouth daily. 03/28/21   Fritzi Mandes, MD  benzocaine (ORAJEL) 10 % mucosal gel Use as directed in the mouth or throat 3 (three) times daily as needed for mouth pain. 03/28/21   Fritzi Mandes, MD  BREO ELLIPTA 100-25 MCG/INH AEPB Inhale 1 puff into the lungs daily.  02/18/15   [provider]  budesonide (PULMICORT) 0.5 MG/2ML nebulizer solution Take 0.5 mg by nebulization 2 (two) times daily as needed (shortness of breath).    [provider]  celecoxib (CELEBREX) 200  MG capsule Take 1 capsule (200 mg total) by mouth 2 (two) times daily. 03/12/21   Tamala Julian B, PA-C  cyanocobalamin (,VITAMIN B-12,) 1000 MCG/ML injection Inject 1,000 mcg into the muscle every 30 (thirty) days. 02/06/21   [provider]  DULoxetine (CYMBALTA) 30 MG capsule Take 30 mg by mouth daily.    [provider]  ergocalciferol (VITAMIN D2) 1.25 MG (50000 UT) capsule Take 50,000 Units by mouth every Tuesday. 01/17/21   [provider]  gabapentin (NEURONTIN) 400 MG capsule Take 1 capsule (400 mg total) by mouth at bedtime. 06/12/20   Loletha Grayer, MD  lamoTRIgine (LAMICTAL) 100 MG tablet Take 100 mg by mouth at bedtime. 03/02/19   [provider]  lansoprazole (PREVACID) 30 MG capsule Take 30 mg by mouth in the morning and at bedtime. 03/22/19   [provider]  LORazepam (ATIVAN) 0.5 MG tablet Take 0.5 mg by mouth 2 (two) times daily.  06/21/10   [provider]  Magnesium 500 MG CAPS Take 1 capsule by mouth daily in the afternoon.    [provider]  memantine (NAMENDA XR) 28 MG CP24 24 hr capsule Take 1 capsule by mouth daily. 08/14/13   [provider]  metoprolol succinate (TOPROL-XL) 50 MG 24 hr tablet Take 50 mg by mouth 2 (two) times daily.  03/07/15   [provider]  montelukast (SINGULAIR) 10 MG tablet Take 10 mg by mouth at bedtime.    [provider]  naloxone [facility]) nasal spray 4  mg/0.1 mL Place 1 spray into the nose once.    [provider]  nitroGLYCERIN (NITROLINGUAL) 0.4 MG/SPRAY spray Place 2 sprays under the tongue every 5 (five) minutes x 3 doses as needed for chest pain.  11/26/14   [provider]  nystatin-triamcinolone (MYCOLOG II) cream Apply 1 application topically 2 (two) times daily. 01/13/21   [provider]  olmesartan (BENICAR) 40 MG tablet Take 40 mg by mouth daily.    [provider]  oxyCODONE (OXY IR/ROXICODONE) 5 MG immediate release tablet Take 1 tablet (5 mg total) by mouth every 8 (eight) hours as needed for severe pain (pain score 4-6). 03/28/21   Fritzi Mandes, MD  polyethylene glycol (MIRALAX / GLYCOLAX) 17 g packet Take 17 g by mouth daily as needed for moderate constipation. 03/28/21   Fritzi Mandes, MD  Red Yeast Rice 600 MG CAPS Take 600 mg by mouth daily.    [provider]  REPATHA SURECLICK 468 MG/ML SOAJ Inject 140 mg into the skin every 14 (fourteen) days. 01/27/21   [provider]  Semaglutide,0.25 or 0.5MG /DOS, (OZEMPIC, 0.25 OR 0.5 MG/DOSE,) 2 MG/1.5ML SOPN Inject 0.5 mg into the skin every Tuesday.    [provider]  sulfamethoxazole-trimethoprim (BACTRIM DS) 800-160 MG tablet Take 1 tablet by mouth every 12 (twelve) hours for 6 days. 03/28/21 04/03/21  Fritzi Mandes, MD  torsemide (DEMADEX) 20 MG tablet Take 0.5 tablets (10 mg total) by mouth daily as needed. For leg edema 03/28/21   Fritzi Mandes, MD  traZODone (DESYREL) 100 MG tablet Take 100 mg by mouth at bedtime. 07/08/20   [provider]  valACYclovir (VALTREX) 1000 MG tablet Take 1,000 mg by mouth daily as needed (Fever blisters). 11/04/17   [provider]    Physical Exam: Vitals:   04/03/21 1930 04/03/21 2020 04/03/21 2030 04/03/21 2130  BP: 96/64 96/61 (!) 86/33 (!) 95/44  Pulse: (!) 59 62 62 (!) 58  Resp:  14 16 20 17   Temp:      TempSrc:      SpO2: 95% 96% 97% 95%  Weight:      Height:      In ed  pt is alert,awake and oriented and o2 sat of 95%.  Labs shows AKI and hyponatremia.   Physical Exam Vitals reviewed.  Constitutional:      Appearance: He is not ill-appearing.  HENT:     Head: Normocephalic and atraumatic.     Right Ear: External ear normal.     Left Ear: External ear normal.     Nose: Nose normal.     Mouth/Throat:     Mouth: Mucous membranes are moist.  Eyes:     Extraocular Movements: Extraocular movements intact.     Pupils: Pupils are equal, round, and reactive to light.  Neck:     Vascular: No carotid bruit.  Cardiovascular:     Rate and Rhythm: Regular rhythm.     Pulses: Normal pulses.     Heart sounds: Normal heart sounds.  Pulmonary:     Effort: Pulmonary effort is normal.     Breath sounds: Rales present.  Abdominal:     General: Bowel sounds are normal.     Palpations: Abdomen is soft.  Musculoskeletal:     Right lower leg: No edema.     Left lower leg: No edema.  Neurological:     General: No focal deficit present.     Mental Status: He is alert and oriented to person, place, and time.  Psychiatric:        Mood and Affect: Mood normal.        Behavior: Behavior normal.   Assessment and Plan: * Hyponatremia- (present on admission)  Latest Reference Range & Units 03/22/13 09:19 04/06/13 04:02 04/07/13 04:21 04/10/13 17:11 03/02/14 04:20 10/23/15 13:46 10/29/15 04:15 10/30/15 05:40 10/30/15 17:03 06/10/20 16:53 06/10/20 21:49 06/11/20 00:56 06/11/20 05:20 06/11/20 08:52 06/12/20 04:59 02/26/21 12:53 03/10/21 19:19 03/25/21 15:35 03/26/21 02:33 03/26/21 06:25 03/26/21 10:17 03/26/21 14:46 03/26/21 19:20 03/26/21 22:50 03/27/21 02:02 03/27/21 05:59 03/27/21 09:46 04/03/21 17:53  Sodium 135 - 145 mmol/L 128 (L) 129 (L) 126 (L) 131 (L) 140 130 (L) 128 (L) 129 (L) 129 (L) 126 (L) 131 (L) 130 (L) 131 (L) 130 (L) 130 (L) 126 (L) 122 (L) 121 (L) 126 (L) 124 (L) 127 (L) 126 (L) 124 (L) 127 (L) 128 (L) 129 (L) 127 (L) 118 (LL)  (LL): Data is critically  low (L): Data is abnormally low Attribute to diuresis. Pt given 2 L IVf.   Hypotension- (present on admission) Blood pressure (!) 95/44, pulse (!) 58, temperature 98.8 F (37.1 C), temperature source Oral, resp. rate 17, height 5\' 7"  (1.702 m), weight 111.2 kg, SpO2 95 %. BP has improved after IVF.  Hold BP meds: Metoprolol. Torsemide. Olmesartan.     AKI (acute kidney injury) ([place])- (present on admission) Lab Results  Component Value Date   CREATININE 1.31 (H) 04/03/2021   CREATININE 0.86 03/27/2021   CREATININE 1.11 03/26/2021  Hold olmesartan / torsemide.    CAD (coronary artery disease)- (present on admission) Pt reports chest pain today once only midsternal. Prn NTG. Hold metoprolol. Hold hydralazine.  Hold torsemide.    Essential hypertension- (present on admission) BP meds held.  Seizure ([place]) Pt continued Lamictal. Lamictal level.     Advance Care Planning:   Code Status: Full Code   Consults: none   Family Communication: Tino,Gay (Spouse)  [phone] (Home Phone)  Severity of Illness: The appropriate patient status for this patient is INPATIENT. Inpatient status is judged to be reasonable and necessary in order to provide the required intensity of service to ensure the patient's safety. The patient's presenting symptoms, physical exam findings, and initial radiographic and laboratory data in the context of their chronic comorbidities is felt to place them at high risk for further clinical deterioration. Furthermore, it is not anticipated that the patient will be medically stable for discharge from the hospital within 2 midnights of admission.   * I certify that at the point of admission it is my clinical judgment that the patient will require inpatient hospital care spanning beyond 2 midnights from the point of admission due to high intensity of service, high risk for further deterioration and high frequency of surveillance  required.*  Author: Para Skeans, MD 04/03/2021 10:34 PM  For on call review [email or web address].

## 2021-04-03 NOTE — ED Notes (Signed)
US at the bedside

## 2021-04-03 NOTE — ED Provider Notes (Signed)
[facility] Provider Note    Event Date/Time   First MD Initiated Contact with Patient 04/03/21 1717     (approximate)   History   Abnormal Lab   HPI  MATE ALEGRIA is a 76 y.o. male with past medical history of heart failure with preserved ejection fraction, CAD, chronic venous insufficiency, CVA, seizure disorder, recent right knee arthroplasty who presents with hyponatremia on outpatient labs.  Patient was initially admitted to the hospital was discharged on 5/39 for metabolic encephalopathy to be due to the underlying opioid effect.  He was mildly hyponatremic during this hospitalization, sodium at discharge was 127.  Patient's torsemide was held and has not been taking it.  Patient has been feeling progressively weak since leaving the hospital.  Just has no energy and feels very fatigued.  Has had decreased appetite as well does not want to eat anything still trying to drink.  Has not peed much today.  He denies shortness of breath, did have some mild chest pain earlier today which is now resolved.  Lower extreme edema that is chronic for him and has not worsened.  He denies fevers chills abdominal pain nausea vomiting or diarrhea.  Patient was started on Bactrim during hospitalization because he had some serous drainage from the knee.    Past Medical History:  Diagnosis Date   (HFpEF) heart failure with preserved ejection fraction ([place]) 02/27/2014   a.) TTE 02/27/14: EF >55%; mod LVH; mild panvalvular regurgitation; (+) global stress induced HK. b.) TTE 09/20/14: EF 55%; triv AR/MR, mild TR/PR; mild AS; BAE; c.) TTE 06/20/15: EF 55%, triv AR/MR; BAE; mild AS. d.) TTE 02/11/17: EF 55; significant  RV dilation with mod systolic dysfunction; mild AS. e.) TTE 02/27/19: EF 55%; mild AS. f.) TTE 06/04/20: EF 50%; mild AS   Anxiety    Aortic stenosis 09/20/2014   a.) TTE 09/20/2014: mild; MPG?. b.) TTE 06/20/2015: mild; MPG 8.0 mmHg. c.) TTE 02/11/2017: mild; MPG  12.4 mmHg. d.) TTE 02/27/2019: mild; MPG 11.4 mmHg. e.) TTE 06/04/2020: mild; MPG 9.6 mmHg.   Arthritis    Asthma    CAD (coronary artery disease)    Cataracts, bilateral    Chronic back pain    Chronic venous insufficiency    Complication of anesthesia    a.) postoperative amnesia (1999). b.) seizures after partial knee in 2014; sent to [facility] x 5 days d/t uncontrollable shaking and AMS; ?? fentanyl induced tremors. c.) anesthesia awareness   COVID-19 05/2018   CVA (cerebral vascular accident) [facility])    Depressed    Essential tremor    GERD (gastroesophageal reflux disease)    Hepatitis B    a.) h/o in the 90s; reports not active at present   History of bronchitis    Jan-Mar 2017   History of hiatal hernia    Hyperlipidemia    Hypertension    Insomnia    Joint pain    Lymphedema    Mild cognitive impairment    Muscle spasm    in neck.Takes Flexeril as needed   Numbness    right knee,right hand,and toes on left   RBBB    Seizures (HCC)    [place]. takes Lamotrigine daily. 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 01/15/2021   Loose right total knee arthroplasty ([place]) 01/12/2021   Impairment of balance 01/03/2021   Muscle rigidity 01/03/2021   Arthritis 07/01/2020   Cataracts, bilateral 07/01/2020   Pneumonia, aspiration ([place]) 07/01/2020   Shingles 07/01/2020   AKI (acute kidney injury) ([place]) 06/10/2020   Hyponatremia 06/10/2020   History of seizures 36/64/4034   Acute diastolic heart failure due to valvular disease ([place]) 06/06/2020   Pedal edema 06/06/2020   At risk for delirium 08/29/2019   Angina  pectoris ([place]) 03/29/2019   Idiopathic peripheral neuropathy 11/02/2018   Labyrinthine disorder 10/21/2018   Diastasis of rectus abdominis 03/18/2018   Diplopia 07/21/2017   Myopia of both eyes with astigmatism 07/21/2017   Nuclear senile cataract, bilateral 07/21/2017   Mild aortic valve stenosis 06/29/2017   History of pulmonary embolus (PE) 11/30/2016   Myopia 09/25/2016   Divergence insufficiency 09/25/2016   Impacted cerumen 09/25/2016   Meibomian gland dysfunction 09/25/2016   Presbyopia 09/25/2016   Tear film insufficiency 09/25/2016   Mild cognitive impairment 09/11/2016   Obesity (BMI 30-39.9) 09/11/2016   Spondylolisthesis of lumbar region 09/11/2016   Thrombocytopenia ([place]) 09/11/2016   Other amnesia 09/09/2016   Seizure ([place]) 09/08/2016   Tremor 09/08/2016   Pain syndrome, chronic 08/10/2016   Acute respiratory failure with hypoxemia ([place]) 07/20/2016   Delirium 07/20/2016   Hypokalemia 07/20/2016   Asthma without status asthmaticus 07/20/2016   Left lower lobe pneumonia 07/20/2016   Spinal stenosis 07/20/2016   Lymphedema 12/16/2015   Chronic venous insufficiency 12/16/2015   Leg swelling 12/16/2015   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   Asthenia 12/10/2015   Carpal tunnel syndrome of left wrist 12/09/2015   Left hemiparesis ([place]) 11/05/2015   Skin sensation disturbance 11/05/2015   Mediastinal mass 10/28/2015   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   Anxiety 05/23/2015   Acid reflux 05/23/2015   HBV (hepatitis B virus) infection 05/23/2015   History of cardiac catheterization 05/23/2015   Hyperlipidemia 05/23/2015   CAD (coronary artery disease) 05/23/2015   Anxiety and depression 05/23/2015   Cough 07/26/2014   Essential hypertension 07/16/2014   Breathlessness on exertion 02/12/2014   Valvular heart disease 07/25/2013   History of CVA (cerebrovascular accident) 06/01/2013   Altered mental status 06/21/2012   Bundle branch block, right 05/29/2012   History of knee surgery  05/26/2012   Ophthalmoplegia 01/01/2010     Physical Exam  Triage Vital Signs: ED Triage Vitals [04/03/21 1658]  Enc Vitals Group     BP (!) 121/58     Pulse Rate (!) 58     Resp 17     Temp 98.8 F (37.1 C)     Temp Source Oral     SpO2 96 %     Weight 245 lb 2.4 oz (111.2 kg)     Height 5\' 7"  (1.702 m)     Head Circumference      Peak Flow      Pain Score 0     Pain Loc      Pain Edu?      Excl. in [place]?     Most recent vital signs: Vitals:   04/03/21 2241 04/03/21 2245  BP: (!) 108/50 (!) 115/58  Pulse: 62 (!) 54  Resp: 17 16  Temp: 98.4 F (36.9 C)  SpO2: 95% 95%     General: Awake, no distress.  CV:  Good peripheral perfusion.  1+ pitting edema in the bilateral lower extremities Resp:  Normal effort.  Abd:  No distention.  Neuro:             Awake, Alert, Oriented x 3  Other:  Mucous membranes are moist   ED Results / Procedures / Treatments  Labs (all labs ordered are listed, but only abnormal results are displayed) Labs Reviewed  BASIC METABOLIC PANEL - Abnormal; Notable for the following components:      Result Value   Sodium 118 (*)    Chloride 88 (*)    Glucose, Bld 138 (*)    Creatinine, Ser 1.31 (*)    Calcium 8.8 (*)    GFR, Estimated 57 (*)    All other components within normal limits  OSMOLALITY, URINE - Abnormal; Notable for the following components:   Osmolality, Ur 262 (*)    All other components within normal limits  OSMOLALITY - Abnormal; Notable for the following components:   Osmolality 253 (*)    All other components within normal limits  TROPONIN I (HIGH SENSITIVITY) - Abnormal; Notable for the following components:   Troponin I (High Sensitivity) 26 (*)    All other components within normal limits  TROPONIN I (HIGH SENSITIVITY) - Abnormal; Notable for the following components:   Troponin I (High Sensitivity) 23 (*)    All other components within normal limits  RESP PANEL BY RT-PCR (FLU A&B, COVID) ARPGX2  SODIUM, URINE,  RANDOM  TSH  BRAIN NATRIURETIC PEPTIDE  CBC  HEMOGLOBIN A1C  T4, FREE  LAMOTRIGINE LEVEL     EKG  EKG interpreted by myself, right bundle branch block, left axis deviation, no acute ischemic changes   RADIOLOGY I reviewed the CXR which does not show any acute cardiopulmonary process; agree with radiology report     PROCEDURES:  Critical Care performed: Yes, see critical care procedure note(s)  Procedures  The patient is on the cardiac monitor to evaluate for evidence of arrhythmia and/or significant heart rate changes.   MEDICATIONS ORDERED IN ED: Medications  aspirin chewable tablet 324 mg (324 mg Oral Not Given 04/03/21 2046)  nitroGLYCERIN (NITROLINGUAL) 0.4 MG/SPRAY spray 2 spray (has no administration in time range)  DULoxetine (CYMBALTA) DR capsule 30 mg (has no administration in time range)  montelukast (SINGULAIR) tablet 10 mg (has no administration in time range)  budesonide (PULMICORT) nebulizer solution 0.5 mg (has no administration in time range)  fluticasone furoate-vilanterol (BREO ELLIPTA) 100-25 MCG/ACT 1 puff (has no administration in time range)  lamoTRIgine (LAMICTAL) tablet 100 mg (has no administration in time range)  pantoprazole (PROTONIX) EC tablet 40 mg (has no administration in time range)  heparin injection 5,000 Units (has no administration in time range)  acetaminophen (TYLENOL) tablet 650 mg (has no administration in time range)    Or  acetaminophen (TYLENOL) suppository 650 mg (has no administration in time range)  ondansetron (ZOFRAN) tablet 4 mg (has no administration in time range)    Or  ondansetron (ZOFRAN) injection 4 mg (has no administration in time range)  albuterol (PROVENTIL) (2.5 MG/3ML) 0.083% nebulizer solution 2.5 mg (has no administration in time range)  sodium chloride 0.9 % bolus 1,000 mL (0 mLs Intravenous Stopped 04/03/21 1942)  sodium chloride 0.9 % bolus 1,000 mL (0 mLs Intravenous Stopped 04/03/21 2246)     IMPRESSION  / MDM / ASSESSMENT AND PLAN / ED COURSE  I reviewed the triage vital signs and the nursing notes.                              Differential diagnosis includes, but is not limited to, hyponatremia, dehydration, SIADH, medication side effect, volume overload  Patient is a 76 year old male presents with hyponatremia and outpatient labs.  He had a sodium check today which was 117, potassium 5.7.  Patient has been feeling generally weak and fatigue has really no other focal symptoms other than some mild chest pain earlier today which is now resolved.  On exam patient appears rather euvolemic, his mucous membranes are moist and he has 1+ pitting edema which she says is chronic for him.  Has not been eating very much though so I question whether he does have some volume depletion.  Vital signs are within normal limits.  Apparently has been running lower blood pressures at home.  I reviewed the labs done from 2 PM today which are notable for a sodium of 117 potassium of 5.3, creatinine of 1.2, at discharge from the hospital on 1/26 his creatinine was 0.86.  I suspect patient may have some body depletion, on review of [place] side effects Bactrim is also known to cause hyponatremia so question whether he has some SIADH from this.  We will send urine studies and given a liter of fluid and then recheck.  Will need admission to the hospital given the degree of hyponatremia and his symptoms.  Patient's BMP here does show a sodium of 118, P is normal.  Serum awesome's are low.  Ultimately he did drop his pressure so he was given another liter of saline.  Discussed with the hospitalist for admission     FINAL CLINICAL IMPRESSION(S) / ED DIAGNOSES   Final diagnoses:  Bilateral leg edema     Rx / DC Orders   ED Discharge Orders     None        Note:  This document was prepared using Dragon voice recognition software and may include unintentional dictation errors.   Rada Hay, MD 04/03/21  [phone]

## 2021-04-03 NOTE — ED Notes (Signed)
Hospitalist at the bedside 

## 2021-04-03 NOTE — Assessment & Plan Note (Signed)
BP meds held.

## 2021-04-03 NOTE — Assessment & Plan Note (Addendum)
Blood pressure (!) 95/44, pulse (!) 58, temperature 98.8 F (37.1 C), temperature source Oral, resp. rate 17, height 5\' 7"  (1.702 m), weight 111.2 kg, SpO2 95 %. BP has improved after IVF.  Hold BP meds: Metoprolol. Torsemide. Olmesartan.

## 2021-04-03 NOTE — Assessment & Plan Note (Signed)
Lab Results  Component Value Date   CREATININE 1.31 (H) 04/03/2021   CREATININE 0.86 03/27/2021   CREATININE 1.11 03/26/2021  Hold olmesartan / torsemide.

## 2021-04-03 NOTE — Assessment & Plan Note (Addendum)
Pt reports chest pain today once only midsternal. Prn NTG. Hold metoprolol. Hold hydralazine.  Hold torsemide.

## 2021-04-03 NOTE — Assessment & Plan Note (Signed)
Pt continued Lamictal. Lamictal level.

## 2021-04-03 NOTE — ED Notes (Signed)
Report received from Ariel, RN 

## 2021-04-03 NOTE — ED Notes (Signed)
X-ray at bedside

## 2021-04-03 NOTE — Assessment & Plan Note (Signed)
Latest Reference Range & Units 03/22/13 09:19 04/06/13 04:02 04/07/13 04:21 04/10/13 17:11 03/02/14 04:20 10/23/15 13:46 10/29/15 04:15 10/30/15 05:40 10/30/15 17:03 06/10/20 16:53 06/10/20 21:49 06/11/20 00:56 06/11/20 05:20 06/11/20 08:52 06/12/20 04:59 02/26/21 12:53 03/10/21 19:19 03/25/21 15:35 03/26/21 02:33 03/26/21 06:25 03/26/21 10:17 03/26/21 14:46 03/26/21 19:20 03/26/21 22:50 03/27/21 02:02 03/27/21 05:59 03/27/21 09:46 04/03/21 17:53  Sodium 135 - 145 mmol/L 128 (L) 129 (L) 126 (L) 131 (L) 140 130 (L) 128 (L) 129 (L) 129 (L) 126 (L) 131 (L) 130 (L) 131 (L) 130 (L) 130 (L) 126 (L) 122 (L) 121 (L) 126 (L) 124 (L) 127 (L) 126 (L) 124 (L) 127 (L) 128 (L) 129 (L) 127 (L) 118 (LL)  (LL): Data is critically low (L): Data is abnormally low Attribute to diuresis. Pt given 2 L IVf.

## 2021-04-04 ENCOUNTER — Other Ambulatory Visit: Payer: Medicare Other

## 2021-04-04 DIAGNOSIS — E871 Hypo-osmolality and hyponatremia: Secondary | ICD-10-CM | POA: Diagnosis not present

## 2021-04-04 LAB — BASIC METABOLIC PANEL
Anion gap: 5 (ref 5–15)
BUN: 12 mg/dL (ref 8–23)
CO2: 24 mmol/L (ref 22–32)
Calcium: 8.8 mg/dL — ABNORMAL LOW (ref 8.9–10.3)
Chloride: 93 mmol/L — ABNORMAL LOW (ref 98–111)
Creatinine, Ser: 1.01 mg/dL (ref 0.61–1.24)
GFR, Estimated: >60 mL/min (ref >60)
Glucose, Bld: 129 mg/dL — ABNORMAL HIGH (ref 70–99)
Potassium: 4.7 mmol/L (ref 3.5–5.1)
Sodium: 122 mmol/L — ABNORMAL LOW (ref 135–145)

## 2021-04-04 LAB — CBC
HCT: 29.7 % — ABNORMAL LOW (ref 39.0–52.0)
Hemoglobin: 10.6 g/dL — ABNORMAL LOW (ref 13.0–17.0)
MCH: 33.2 pg (ref 26.0–34.0)
MCHC: 35.7 g/dL (ref 30.0–36.0)
MCV: 93.1 fL (ref 80.0–100.0)
Platelets: 165 10*3/uL (ref 150–400)
RBC: 3.19 MIL/uL — ABNORMAL LOW (ref 4.22–5.81)
RDW: 13 % (ref 11.5–15.5)
WBC: 6.2 10*3/uL (ref 4.0–10.5)
nRBC: 0 % (ref 0.0–0.2)

## 2021-04-04 LAB — HEMOGLOBIN A1C
Hgb A1c MFr Bld: 5.1 % (ref 4.8–5.6)
Mean Plasma Glucose: 99.67 mg/dL

## 2021-04-04 LAB — T4, FREE: Free T4: 0.63 ng/dL (ref 0.61–1.12)

## 2021-04-04 MED ORDER — TRAZODONE HCL 50 MG PO TABS
150.0000 mg | ORAL_TABLET | Freq: Every day | ORAL | Status: DC
  Administered 2021-04-04 – 2021-04-05 (×2): 150 mg via ORAL
  Filled 2021-04-04 (×2): qty 1

## 2021-04-04 MED ORDER — LORAZEPAM 0.5 MG PO TABS
0.5000 mg | ORAL_TABLET | Freq: Two times a day (BID) | ORAL | Status: DC
  Administered 2021-04-04 – 2021-04-06 (×5): 0.5 mg via ORAL
  Filled 2021-04-04 (×5): qty 1

## 2021-04-04 MED ORDER — SODIUM CHLORIDE 0.9 % IV SOLN: INTRAVENOUS | Status: DC

## 2021-04-04 MED ORDER — METOPROLOL SUCCINATE ER 50 MG PO TB24
50.0000 mg | ORAL_TABLET | Freq: Two times a day (BID) | ORAL | Status: DC
  Administered 2021-04-04 – 2021-04-06 (×5): 50 mg via ORAL
  Filled 2021-04-04 (×5): qty 1

## 2021-04-04 MED ORDER — ALBUTEROL SULFATE HFA 108 (90 BASE) MCG/ACT IN AERS
2.0000 | INHALATION_SPRAY | RESPIRATORY_TRACT | Status: DC | PRN
  Filled 2021-04-04 (×2): qty 6.7

## 2021-04-04 MED ORDER — ASPIRIN EC 81 MG PO TBEC
81.0000 mg | DELAYED_RELEASE_TABLET | Freq: Every day | ORAL | Status: DC
  Administered 2021-04-04 – 2021-04-06 (×3): 81 mg via ORAL
  Filled 2021-04-04 (×3): qty 1

## 2021-04-04 NOTE — TOC CM/SW Note (Signed)
°  Transition of Care [facility]) Screening Note   Patient Details  Name: Johnny Mccoy Date of Birth: June 25, 1945   Transition of Care [facility]) CM/SW Contact:    Candie Chroman, LCSW Phone Number: 04/04/2021, 3:05 PM    Transition of Care Department [facility]) has reviewed patient and no TOC needs have been identified at this time. We will continue to monitor patient advancement through interdisciplinary progression rounds. If new patient transition needs arise, please place a TOC consult.

## 2021-04-04 NOTE — Progress Notes (Signed)
°  Subjective:   s/p R Knee Revision Arthroplasty 03/10/21 Patient reports pain as well-controlled.   Wife at bedside. No new complaints with knee, but does mention some decreased sensation over the lateral proximal thigh that has been present for a week or so. Reports no issue with Prevena. Was supposed to be seen in office today for wound vac removal. Took last Bactrim DS yesterday  Objective: Vital signs in last 24 hours: Temp:  [97.8 F (36.6 C)-98.8 F (37.1 C)] 98.8 F (37.1 C) (02/03 1146) Pulse Rate:  [48-69] 54 (02/03 1146) Resp:  [13-20] 16 (02/03 1146) BP: (86-152)/(33-75) 113/75 (02/03 1146) SpO2:  [94 %-99 %] 97 % (02/03 1146) Weight:  [111.2 kg] 111.2 kg (02/02 1658)  Intake/Output from previous day:  Intake/Output Summary (Last 24 hours) at 04/04/2021 1326 Last data filed at 04/04/2021 1019 Gross per 24 hour  Intake 2412.4 ml  Output 1920 ml  Net 492.4 ml    Intake/Output this shift: Total I/O In: 240 [P.O.:240] Out: -   Labs: Recent Labs    04/04/21 0021  HGB 10.6*   Recent Labs    04/04/21 0021  WBC 6.2  RBC 3.19*  HCT 29.7*  PLT 165   Recent Labs    04/03/21 1753 04/04/21 1005  NA 118* 122*  K 4.9 4.7  CL 88* 93*  CO2 23 24  BUN 17 12  CREATININE 1.31* 1.01  GLUCOSE 138* 129*  CALCIUM 8.8* 8.8*   No results for input(s): LABPT, INR in the last 72 hours.   EXAM General - Patient is Alert, Appropriate, and Oriented Extremity - Neurovascular intact Dorsiflexion/Plantar flexion intact Compartment soft Dressing/Incision -Prevena in place; following removal of dressing no active drainage noted. Erythema improved from previous. Slight maceration of skin noted distally.  Motor Function - intact, moving foot and toes well on exam.   Assessment/Plan:    Principal Problem:   Hyponatremia Active Problems:   Essential hypertension   CAD (coronary artery disease)   AKI (acute kidney injury) ([place])   History of pulmonary embolism   Seizure  (HCC)   Hypotension  Estimated body mass index is 38.4 kg/m as calculated from the following:   Height as of this encounter: 5\' 7"  (1.702 m).   Weight as of this encounter: 111.2 kg. Up with therapy  Prevena removed. Steri strips placed over incision with ABDs applied and secured with ACE wrap.   Advised patient's wife to bring in [place] as patient will likely be here overnight.     DVT Prophylaxis - Ted hose, ASA 81, heparin  Weight-Bearing as tolerated to right leg  Cassell Smiles, PA-C [facility] 04/04/2021, 1:26 PM

## 2021-04-04 NOTE — Progress Notes (Signed)
PROGRESS NOTE    Johnny Mccoy  MWU:[id number] DOB: September 14, 1945 DOA: 04/03/2021 PCP: Idelle Crouch, MD  Outpatient Specialists: cardiology, pulmonology    Brief Narrative:   Recent hospitalization for right knee arthroplasty. Hospitalized a week ago with acute encephalopathy thought to be secondary to opioids. Has been feeling weak for about a week and presented to PCP on 2/2 for hospital f/u. Labs there showed hyponatremia worsened from baseline so patient sent to ED for further evaluation.   Assessment & Plan:   Principal Problem:   Hyponatremia Active Problems:   Essential hypertension   CAD (coronary artery disease)   AKI (acute kidney injury) ([place])   History of pulmonary embolism   Seizure (HCC)   Hypotension  # Hyponatremia Has hx of hyponatremia. Here subacute on chronic, 118 from 126 a week prior. With AKI so suspect this is from volume depletion. Is s/p 2 L NS in the ED. Previously treated with diuretics for lower extremity edema though says has been off them for 2 weeks.  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 Had ivc filter placed 03/2021 in preparation for knee surgery  # CAD S/p multiple stents. Asymptomatic - cont home metop, re-start aspirin  # Recent right knee arthroplasty Has wound vac. Was scheduled to see ortho in clinic today - have messaged dr. Roland Rack about seeing today, possibly discontinuing wound vac   DVT prophylaxis: heparin Code Status: full Family Communication: wife updated @ bedside  Level of care: Progressive Status is: Inpatient Remains inpatient appropriate because: severity of illness            Consultants:  none  Procedures: none  Antimicrobials:  none    Subjective: Feels generally weak, no chest pain, knee pain controlled  Objective: Vitals:    04/03/21 2330 04/04/21 0009 04/04/21 0507 04/04/21 0832  BP: 119/67 (!) 152/72 (!) 121/55 134/61  Pulse: (!) 48 (!) 58 (!) 59 (!) 54  Resp: 16 18 18 14   Temp:  98.4 F (36.9 C) 97.8 F (36.6 C) 97.9 F (36.6 C)  TempSrc:   Oral   SpO2: 94% 97% 96% 94%  Weight:      Height:        Intake/Output Summary (Last 24 hours) at 04/04/2021 0956 Last data filed at 04/04/2021 0541 Gross per 24 hour  Intake 2172.4 ml  Output 1920 ml  Net 252.4 ml   Filed Weights   04/03/21 1658  Weight: 111.2 kg    Examination:  General exam: Appears calm and comfortable  Respiratory system: Clear to auscultation. Respiratory effort normal. Cardiovascular system: S1 & S2 heard, RRR. No JVD, murmurs, rubs, gallops or clicks. No pedal edema. Gastrointestinal system: Abdomen is nondistended, soft and nontender. No organomegaly or masses felt. Normal bowel sounds heard. Central nervous system: Alert and oriented. No focal neurological deficits. Extremities: wound vac right knee surrounding tissue warm, no induration Skin: No rashes, lesions or ulcers Psychiatry: Judgement and insight appear normal. Mood & affect appropriate.     Data Reviewed: I have personally reviewed following labs and imaging studies  CBC: Recent Labs  Lab 04/04/21 0021  WBC 6.2  HGB 10.6*  HCT 29.7*  MCV 93.1  PLT 725   Basic Metabolic Panel: Recent Labs  Lab 04/03/21 1753  NA 118*  K 4.9  CL  88*  CO2 23  GLUCOSE 138*  BUN 17  CREATININE 1.31*  CALCIUM 8.8*   GFR: Estimated Creatinine Clearance: 58.8 mL/min (A) (by C-G formula based on SCr of 1.31 mg/dL (H)). Liver Function Tests: No results for input(s): AST, ALT, ALKPHOS, BILITOT, PROT, ALBUMIN in the last 168 hours. No results for input(s): LIPASE, AMYLASE in the last 168 hours. No results for input(s): AMMONIA in the last 168 hours. Coagulation Profile: No results for input(s): INR, PROTIME in the last 168 hours. Cardiac Enzymes: No results for input(s):  CKTOTAL, CKMB, CKMBINDEX, TROPONINI in the last 168 hours. BNP (last 3 results) No results for input(s): PROBNP in the last 8760 hours. HbA1C: Recent Labs    04/04/21 0021  HGBA1C 5.1   CBG: No results for input(s): GLUCAP in the last 168 hours. Lipid Profile: No results for input(s): CHOL, HDL, LDLCALC, TRIG, CHOLHDL, LDLDIRECT in the last 72 hours. Thyroid Function Tests: Recent Labs    04/03/21 1753 04/04/21 0021  TSH 3.905  --   FREET4  --  0.63   Anemia Panel: No results for input(s): VITAMINB12, FOLATE, FERRITIN, TIBC, IRON, RETICCTPCT in the last 72 hours. Urine analysis:    Component Value Date/Time   COLORURINE YELLOW 03/25/2021 2208   APPEARANCEUR CLEAR 03/25/2021 2208   APPEARANCEUR Clear 07/03/2020 1007   LABSPEC <1.005 (L) 03/25/2021 2208   LABSPEC 1.011 03/22/2013 0840   PHURINE 7.0 03/25/2021 2208   GLUCOSEU NEGATIVE 03/25/2021 2208   GLUCOSEU Negative 03/22/2013 0840   HGBUR NEGATIVE 03/25/2021 2208   BILIRUBINUR NEGATIVE 03/25/2021 2208   BILIRUBINUR Negative 07/03/2020 1007   BILIRUBINUR Negative 03/22/2013 0840   KETONESUR NEGATIVE 03/25/2021 [place] 03/25/2021 2208   NITRITE NEGATIVE 03/25/2021 2208   LEUKOCYTESUR NEGATIVE 03/25/2021 2208   LEUKOCYTESUR Negative 03/22/2013 0840   Sepsis Labs: @LABRCNTIP (procalcitonin:4,lacticidven:4)  ) Recent Results (from the past 240 hour(s))  Culture, blood (routine x 2)     Status: None   Collection Time: 03/25/21  3:36 PM   Specimen: BLOOD  Result Value Ref Range Status   Specimen Description BLOOD BLOOD RIGHT ARM  Final   Special Requests BOTTLES DRAWN AEROBIC AND ANAEROBIC BCAV  Final   Culture   Final    NO GROWTH 5 DAYS Performed at [facility], [address] [place], [place] [address]    Report Status 03/30/2021 FINAL  Final  Resp Panel by RT-PCR (Flu A&B, Covid) Nasopharyngeal Swab     Status: None   Collection Time: 03/25/21  4:18 PM   Specimen:  Nasopharyngeal Swab; Nasopharyngeal(NP) swabs in vial transport medium  Result Value Ref Range Status   SARS Coronavirus 2 by RT PCR NEGATIVE NEGATIVE Final    Comment: (NOTE) SARS-CoV-2 target nucleic acids are NOT DETECTED.  The SARS-CoV-2 RNA is generally detectable in upper respiratory specimens during the acute phase of infection. The lowest concentration of SARS-CoV-2 viral copies this assay can detect is 138 copies/mL. A negative result does not preclude SARS-Cov-2 infection and should not be used as the sole basis for treatment or other patient management decisions. A negative result may occur with  improper specimen collection/handling, submission of specimen other than nasopharyngeal swab, presence of viral mutation(s) within the areas targeted by this assay, and inadequate number of viral copies(<138 copies/mL). A negative result must be combined with clinical observations, patient history, and epidemiological information. The expected result is Negative.  Fact Sheet for Patients:  EntrepreneurPulse.com.au  Fact Sheet for Healthcare Providers:  IncredibleEmployment.be  This test is no t yet approved or cleared by the [place] and  has been authorized for detection and/or diagnosis of SARS-CoV-2 by FDA under an Emergency Use Authorization (EUA). This EUA will remain  in effect (meaning this test can be used) for the duration of the COVID-19 declaration under Section 564(b)(1) of the Act, 21 U.S.C.section 360bbb-3(b)(1), unless the authorization is terminated  or revoked sooner.       Influenza A by PCR NEGATIVE NEGATIVE Final   Influenza B by PCR NEGATIVE NEGATIVE Final    Comment: (NOTE) The Xpert Xpress SARS-CoV-2/FLU/RSV plus assay is intended as an aid in the diagnosis of influenza from Nasopharyngeal swab specimens and should not be used as a sole basis for treatment. Nasal washings and aspirates are unacceptable for  Xpert Xpress SARS-CoV-2/FLU/RSV testing.  Fact Sheet for Patients: EntrepreneurPulse.com.au  Fact Sheet for Healthcare Providers: IncredibleEmployment.be  This test is not yet approved or cleared by the [place] FDA and has been authorized for detection and/or diagnosis of SARS-CoV-2 by FDA under an Emergency Use Authorization (EUA). This EUA will remain in effect (meaning this test can be used) for the duration of the COVID-19 declaration under Section 564(b)(1) of the Act, 21 U.S.C. section 360bbb-3(b)(1), unless the authorization is terminated or revoked.  Performed at [facility], [address] [place], [place] [address]   Urine Culture     Status: None   Collection Time: 03/25/21 10:08 PM   Specimen: Urine, Catheterized  Result Value Ref Range Status   Specimen Description   Final    URINE, CATHETERIZED Performed at [facility], [address], [place], [place] [address]    Special Requests   Final    NONE Performed at [facility], [address], [place], [place] [address]    Culture   Final    NO GROWTH Performed at [facility], [place] [address] [place], [place] [address]    Report Status 03/27/2021 FINAL  Final  Culture, blood (Routine X 2) w Reflex to ID Panel     Status: None   Collection Time: 03/26/21  1:02 AM   Specimen: BLOOD  Result Value Ref Range Status   Specimen Description BLOOD LEFT HAND  Final   Special Requests   Final    BOTTLES DRAWN AEROBIC AND ANAEROBIC Blood Culture results may not be optimal due to an inadequate volume of blood received in culture bottles   Culture   Final    NO GROWTH 5 DAYS Performed at [facility], [address] [place], [place] [address]    Report Status 03/31/2021 FINAL  Final  Resp Panel by RT-PCR (Flu A&B, Covid) Nasopharyngeal Swab     Status: None   Collection Time: 04/03/21  6:35 PM   Specimen: Nasopharyngeal Swab;  Nasopharyngeal(NP) swabs in vial transport medium  Result Value Ref Range Status   SARS Coronavirus 2 by RT PCR NEGATIVE NEGATIVE Final    Comment: (NOTE) SARS-CoV-2 target nucleic acids are NOT DETECTED.  The SARS-CoV-2 RNA is generally detectable in upper respiratory specimens during the acute phase of infection. The lowest concentration of SARS-CoV-2 viral copies this assay can detect is 138 copies/mL. A negative result does not preclude SARS-Cov-2 infection and should not be used as the sole basis for treatment or other patient management decisions. A negative result may occur with  improper specimen collection/handling, submission of specimen other than nasopharyngeal swab, presence of viral mutation(s) within  the areas targeted by this assay, and inadequate number of viral copies(<138 copies/mL). A negative result must be combined with clinical observations, patient history, and epidemiological information. The expected result is Negative.  Fact Sheet for Patients:  EntrepreneurPulse.com.au  Fact Sheet for Healthcare Providers:  IncredibleEmployment.be  This test is no t yet approved or cleared by the [place] FDA and  has been authorized for detection and/or diagnosis of SARS-CoV-2 by FDA under an Emergency Use Authorization (EUA). This EUA will remain  in effect (meaning this test can be used) for the duration of the COVID-19 declaration under Section 564(b)(1) of the Act, 21 U.S.C.section 360bbb-3(b)(1), unless the authorization is terminated  or revoked sooner.       Influenza A by PCR NEGATIVE NEGATIVE Final   Influenza B by PCR NEGATIVE NEGATIVE Final    Comment: (NOTE) The Xpert Xpress SARS-CoV-2/FLU/RSV plus assay is intended as an aid in the diagnosis of influenza from Nasopharyngeal swab specimens and should not be used as a sole basis for treatment. Nasal washings and aspirates are unacceptable for Xpert Xpress  SARS-CoV-2/FLU/RSV testing.  Fact Sheet for Patients: EntrepreneurPulse.com.au  Fact Sheet for Healthcare Providers: IncredibleEmployment.be  This test is not yet approved or cleared by the [place] FDA and has been authorized for detection and/or diagnosis of SARS-CoV-2 by FDA under an Emergency Use Authorization (EUA). This EUA will remain in effect (meaning this test can be used) for the duration of the COVID-19 declaration under Section 564(b)(1) of the Act, 21 U.S.C. section 360bbb-3(b)(1), unless the authorization is terminated or revoked.  Performed at [facility], [address], [place], [place] [address]          Radiology Studies: US Venous Img Lower Bilateral (DVT)  Result Date: 04/03/2021 CLINICAL DATA:  Lower extremity edema EXAM: BILATERAL LOWER EXTREMITY VENOUS DOPPLER ULTRASOUND TECHNIQUE: Gray-scale sonography with compression, as well as color and duplex ultrasound, were performed to evaluate the deep venous system(s) from the level of the common femoral vein through the popliteal and proximal calf veins. COMPARISON:  None. FINDINGS: VENOUS Normal compressibility of the common femoral, superficial femoral, and popliteal veins, as well as the visualized calf veins. Visualized portions of profunda femoral vein and great saphenous vein unremarkable. No filling defects to suggest DVT on grayscale or color Doppler imaging. Doppler waveforms show normal direction of venous flow, normal respiratory plasticity and response to augmentation. OTHER None. Limitations: none IMPRESSION: Negative. Electronically Signed   By: Fidela Salisbury M.D.   On: 04/03/2021 23:05   DG Chest Portable 1 View  Result Date: 04/03/2021 CLINICAL DATA:  Chest pain earlier today.  Lethargy. EXAM: PORTABLE CHEST 1 VIEW COMPARISON:  03/25/2021 FINDINGS: The cardiac silhouette remains borderline enlarged. Stable sternal/manubrial wires. Clear lungs with  normal vascularity. Mild thoracic spine degenerative changes. IMPRESSION: No acute abnormality. Electronically Signed   By: Claudie Revering M.D.   On: 04/03/2021 18:04        Scheduled Meds:  aspirin  324 mg Oral Once   DULoxetine  30 mg Oral Daily   fluticasone furoate-vilanterol  1 puff Inhalation Daily   heparin  5,000 Units Subcutaneous Q8H   lamoTRIgine  100 mg Oral QHS   LORazepam  0.5 mg Oral BID   metoprolol succinate  50 mg Oral BID   montelukast  10 mg Oral QHS   pantoprazole  40 mg Oral Daily   Continuous Infusions:   LOS: 1 day    Time spent: 40 min  Desma Maxim, MD [facility]   If 7PM-7AM, please contact night-coverage [email or web address] Password [facility] 04/04/2021, 9:56 AM

## 2021-04-04 NOTE — Plan of Care (Signed)
Problem: Education: Goal: Knowledge of General Education information will improve Description: Including pain rating scale, medication(s)/side effects and non-pharmacologic comfort measures Outcome: Completed/Met   Problem: Health Behavior/Discharge Planning: Goal: Ability to manage health-related needs will improve Outcome: Completed/Met   Problem: Clinical Measurements: Goal: Ability to maintain clinical measurements within normal limits will improve Outcome: Completed/Met Goal: Will remain free from infection Outcome: Completed/Met Goal: Diagnostic test results will improve Outcome: Completed/Met Goal: Respiratory complications will improve Outcome: Completed/Met Goal: Cardiovascular complication will be avoided Outcome: Completed/Met   Problem: Clinical Measurements: Goal: Will remain free from infection Outcome: Completed/Met   Problem: Clinical Measurements: Goal: Diagnostic test results will improve Outcome: Completed/Met   Problem: Clinical Measurements: Goal: Cardiovascular complication will be avoided Outcome: Completed/Met   Problem: Clinical Measurements: Goal: Diagnostic test results will improve Outcome: Completed/Met   Problem: Clinical Measurements: Goal: Respiratory complications will improve Outcome: Completed/Met   Problem: Clinical Measurements: Goal: Cardiovascular complication will be avoided Outcome: Completed/Met   Problem: Activity: Goal: Risk for activity intolerance will decrease Outcome: Completed/Met   Problem: Nutrition: Goal: Adequate nutrition will be maintained Outcome: Completed/Met   Problem: Coping: Goal: Level of anxiety will decrease Outcome: Completed/Met   Problem: Elimination: Goal: Will not experience complications related to bowel motility Outcome: Completed/Met Goal: Will not experience complications related to urinary retention Outcome: Completed/Met   Problem: Activity: Goal: Risk for activity intolerance  will decrease Outcome: Completed/Met   Problem: Elimination: Goal: Will not experience complications related to urinary retention Outcome: Completed/Met   Problem: Pain Managment: Goal: General experience of comfort will improve Outcome: Completed/Met   Problem: Pain Managment: Goal: General experience of comfort will improve Outcome: Completed/Met   Problem: Safety: Goal: Ability to remain free from injury will improve Outcome: Completed/Met   Problem: Skin Integrity: Goal: Risk for impaired skin integrity will decrease Outcome: Completed/Met

## 2021-04-05 DIAGNOSIS — E871 Hypo-osmolality and hyponatremia: Secondary | ICD-10-CM | POA: Diagnosis not present

## 2021-04-05 LAB — SODIUM, URINE, RANDOM: Sodium, Ur: 124 mmol/L

## 2021-04-05 LAB — BASIC METABOLIC PANEL
Anion gap: 7 (ref 5–15)
BUN: 9 mg/dL (ref 8–23)
CO2: 23 mmol/L (ref 22–32)
Calcium: 8.7 mg/dL — ABNORMAL LOW (ref 8.9–10.3)
Chloride: 93 mmol/L — ABNORMAL LOW (ref 98–111)
Creatinine, Ser: 1.03 mg/dL (ref 0.61–1.24)
GFR, Estimated: >60 mL/min (ref >60)
Glucose, Bld: 106 mg/dL — ABNORMAL HIGH (ref 70–99)
Potassium: 4.5 mmol/L (ref 3.5–5.1)
Sodium: 123 mmol/L — ABNORMAL LOW (ref 135–145)

## 2021-04-05 LAB — OSMOLALITY, URINE: Osmolality, Ur: 494 mOsm/kg (ref 300–900)

## 2021-04-05 MED ORDER — HEPARIN SODIUM (PORCINE) 5000 UNIT/ML IJ SOLN
5000.0000 [IU] | Freq: Three times a day (TID) | INTRAMUSCULAR | Status: AC
  Administered 2021-04-05 (×2): 5000 [IU] via SUBCUTANEOUS
  Filled 2021-04-05 (×2): qty 1

## 2021-04-05 MED ORDER — DULOXETINE HCL 20 MG PO CPEP
20.0000 mg | ORAL_CAPSULE | Freq: Every day | ORAL | Status: DC
  Administered 2021-04-06: 20 mg via ORAL
  Filled 2021-04-05: qty 1

## 2021-04-05 MED ORDER — HYDROCODONE-ACETAMINOPHEN 5-325 MG PO TABS
1.0000 | ORAL_TABLET | Freq: Four times a day (QID) | ORAL | Status: DC | PRN
  Administered 2021-04-05 – 2021-04-06 (×2): 1 via ORAL
  Filled 2021-04-05 (×2): qty 1

## 2021-04-05 MED ORDER — ENOXAPARIN SODIUM 60 MG/0.6ML IJ SOSY
0.5000 mg/kg | PREFILLED_SYRINGE | INTRAMUSCULAR | Status: DC
  Administered 2021-04-06: 55 mg via SUBCUTANEOUS
  Filled 2021-04-05: qty 0.6

## 2021-04-05 MED ORDER — ENOXAPARIN SODIUM 40 MG/0.4ML IJ SOSY: 40.0000 mg | PREFILLED_SYRINGE | INTRAMUSCULAR | Status: DC

## 2021-04-05 NOTE — Plan of Care (Signed)
  Problem: Clinical Measurements: Goal: Respiratory complications will improve Outcome: Progressing Goal: Cardiovascular complication will be avoided Outcome: Progressing   

## 2021-04-05 NOTE — Progress Notes (Addendum)
PROGRESS NOTE    Johnny Mccoy  JXB:[id number] DOB: 1945-07-25 DOA: 04/03/2021 PCP: Idelle Crouch, MD  Outpatient Specialists: cardiology, pulmonology    Brief Narrative:   Recent hospitalization for right knee arthroplasty. Hospitalized a week ago with acute encephalopathy thought to be secondary to opioids. Has been feeling weak for about a week and presented to PCP on 2/2 for hospital f/u. Labs there showed hyponatremia worsened from baseline so patient sent to ED for further evaluation.   Assessment & Plan:   Principal Problem:   Hyponatremia Active Problems:   Essential hypertension   CAD (coronary artery disease)   AKI (acute kidney injury) ([place])   History of pulmonary embolism   Seizure (HCC)   Hypotension  # Hyponatremia Has hx of hyponatremia. Here subacute on chronic, 118 from 126 a week prior. With AKI so suspect this is from volume depletion. Is s/p 2 L NS in the ED. Previously treated with diuretics for lower extremity edema though says has been off them for 2 weeks. Home lamotrigine and/or duloxetine likely cause of chronic siadh. Sodium has improved to 123 today - continue gentle hydration - repeat urine lytes pending  # Diarrhea This PM complains to nurse of watery diarrhea. No fevers, no leukocytosis on yesterday's labs. 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 Had ivc filter placed 03/2021 in preparation for knee surgery  # CAD S/p multiple stents. Asymptomatic - cont home metop, re-start aspirin  # Recent right knee arthroplasty Ortho saw yesterday, removed wound vac. - pt/ot consults - outpt ortho f/u   DVT prophylaxis: heparin Code  Status: full Family Communication: wife updated @ bedside  Level of care: Progressive Status is: Inpatient Remains inpatient appropriate because: severity of illness    Consultants:  none  Procedures: none  Antimicrobials:  none    Subjective: Feels generally weak, no chest pain, knee pain controlled  Objective: Vitals:   04/05/21 0014 04/05/21 0500 04/05/21 0836 04/05/21 1139  BP: 104/66 110/68 117/68 131/74  Pulse: (!) 56 (!) 58 62 (!) 54  Resp: 17 16 16 16   Temp: 98.4 F (36.9 C) 98.4 F (36.9 C) (!) 97.3 F (36.3 C) 98.4 F (36.9 C)  TempSrc: Oral Oral Oral Oral  SpO2: 95% 96% 96% 96%  Weight:      Height:        Intake/Output Summary (Last 24 hours) at 04/05/2021 1213 Last data filed at 04/05/2021 0659 Gross per 24 hour  Intake 1706.82 ml  Output 1500 ml  Net 206.82 ml   Filed Weights   04/03/21 1658  Weight: 111.2 kg    Examination:  General exam: Appears calm and comfortable  Respiratory system: Clear to auscultation. Respiratory effort normal. Cardiovascular system: S1 & S2 heard, RRR. No JVD, murmurs, rubs, gallops or clicks. No pedal edema. Gastrointestinal system: Abdomen is nondistended, soft and nontender. No organomegaly or masses felt. Normal bowel sounds heard. Central nervous system: Alert and oriented. No focal neurological deficits. Extremities: bandage right knee Skin: No rashes, lesions or ulcers Psychiatry: Judgement and insight appear normal. Mood & affect appropriate.     Data Reviewed: I have personally reviewed following  labs and imaging studies  CBC: Recent Labs  Lab 04/04/21 0021  WBC 6.2  HGB 10.6*  HCT 29.7*  MCV 93.1  PLT 235   Basic Metabolic Panel: Recent Labs  Lab 04/03/21 1753 04/04/21 1005 04/05/21 0516  NA 118* 122* 123*  K 4.9 4.7 4.5  CL 88* 93* 93*  CO2 23 24 23   GLUCOSE 138* 129* 106*  BUN 17 12 9   CREATININE 1.31* 1.01 1.03  CALCIUM 8.8* 8.8* 8.7*   GFR: Estimated Creatinine Clearance:  74.8 mL/min (by C-G formula based on SCr of 1.03 mg/dL). Liver Function Tests: No results for input(s): AST, ALT, ALKPHOS, BILITOT, PROT, ALBUMIN in the last 168 hours. No results for input(s): LIPASE, AMYLASE in the last 168 hours. No results for input(s): AMMONIA in the last 168 hours. Coagulation Profile: No results for input(s): INR, PROTIME in the last 168 hours. Cardiac Enzymes: No results for input(s): CKTOTAL, CKMB, CKMBINDEX, TROPONINI in the last 168 hours. BNP (last 3 results) No results for input(s): PROBNP in the last 8760 hours. HbA1C: Recent Labs    04/04/21 0021  HGBA1C 5.1   CBG: No results for input(s): GLUCAP in the last 168 hours. Lipid Profile: No results for input(s): CHOL, HDL, LDLCALC, TRIG, CHOLHDL, LDLDIRECT in the last 72 hours. Thyroid Function Tests: Recent Labs    04/03/21 1753 04/04/21 0021  TSH 3.905  --   FREET4  --  0.63   Anemia Panel: No results for input(s): VITAMINB12, FOLATE, FERRITIN, TIBC, IRON, RETICCTPCT in the last 72 hours. Urine analysis:    Component Value Date/Time   COLORURINE YELLOW 03/25/2021 2208   APPEARANCEUR CLEAR 03/25/2021 2208   APPEARANCEUR Clear 07/03/2020 1007   LABSPEC <1.005 (L) 03/25/2021 2208   LABSPEC 1.011 03/22/2013 0840   PHURINE 7.0 03/25/2021 2208   GLUCOSEU NEGATIVE 03/25/2021 2208   GLUCOSEU Negative 03/22/2013 0840   HGBUR NEGATIVE 03/25/2021 2208   BILIRUBINUR NEGATIVE 03/25/2021 2208   BILIRUBINUR Negative 07/03/2020 1007   BILIRUBINUR Negative 03/22/2013 0840   KETONESUR NEGATIVE 03/25/2021 [place] 03/25/2021 2208   NITRITE NEGATIVE 03/25/2021 2208   LEUKOCYTESUR NEGATIVE 03/25/2021 2208   LEUKOCYTESUR Negative 03/22/2013 0840   Sepsis Labs: @LABRCNTIP (procalcitonin:4,lacticidven:4)  ) Recent Results (from the past 240 hour(s))  Resp Panel by RT-PCR (Flu A&B, Covid) Nasopharyngeal Swab     Status: None   Collection Time: 04/03/21  6:35 PM   Specimen: Nasopharyngeal  Swab; Nasopharyngeal(NP) swabs in vial transport medium  Result Value Ref Range Status   SARS Coronavirus 2 by RT PCR NEGATIVE NEGATIVE Final    Comment: (NOTE) SARS-CoV-2 target nucleic acids are NOT DETECTED.  The SARS-CoV-2 RNA is generally detectable in upper respiratory specimens during the acute phase of infection. The lowest concentration of SARS-CoV-2 viral copies this assay can detect is 138 copies/mL. A negative result does not preclude SARS-Cov-2 infection and should not be used as the sole basis for treatment or other patient management decisions. A negative result may occur with  improper specimen collection/handling, submission of specimen other than nasopharyngeal swab, presence of viral mutation(s) within the areas targeted by this assay, and inadequate number of viral copies(<138 copies/mL). A negative result must be combined with clinical observations, patient history, and epidemiological information. The expected result is Negative.  Fact Sheet for Patients:  EntrepreneurPulse.com.au  Fact Sheet for Healthcare Providers:  IncredibleEmployment.be  This test is no t yet approved or cleared by the [place] and  has been authorized for  detection and/or diagnosis of SARS-CoV-2 by FDA under an Emergency Use Authorization (EUA). This EUA will remain  in effect (meaning this test can be used) for the duration of the COVID-19 declaration under Section 564(b)(1) of the Act, 21 U.S.C.section 360bbb-3(b)(1), unless the authorization is terminated  or revoked sooner.       Influenza A by PCR NEGATIVE NEGATIVE Final   Influenza B by PCR NEGATIVE NEGATIVE Final    Comment: (NOTE) The Xpert Xpress SARS-CoV-2/FLU/RSV plus assay is intended as an aid in the diagnosis of influenza from Nasopharyngeal swab specimens and should not be used as a sole basis for treatment. Nasal washings and aspirates are unacceptable for Xpert Xpress  SARS-CoV-2/FLU/RSV testing.  Fact Sheet for Patients: EntrepreneurPulse.com.au  Fact Sheet for Healthcare Providers: IncredibleEmployment.be  This test is not yet approved or cleared by the [place] FDA and has been authorized for detection and/or diagnosis of SARS-CoV-2 by FDA under an Emergency Use Authorization (EUA). This EUA will remain in effect (meaning this test can be used) for the duration of the COVID-19 declaration under Section 564(b)(1) of the Act, 21 U.S.C. section 360bbb-3(b)(1), unless the authorization is terminated or revoked.  Performed at [facility], [address], [place], [place] [address]          Radiology Studies: US Venous Img Lower Bilateral (DVT)  Result Date: 04/03/2021 CLINICAL DATA:  Lower extremity edema EXAM: BILATERAL LOWER EXTREMITY VENOUS DOPPLER ULTRASOUND TECHNIQUE: Gray-scale sonography with compression, as well as color and duplex ultrasound, were performed to evaluate the deep venous system(s) from the level of the common femoral vein through the popliteal and proximal calf veins. COMPARISON:  None. FINDINGS: VENOUS Normal compressibility of the common femoral, superficial femoral, and popliteal veins, as well as the visualized calf veins. Visualized portions of profunda femoral vein and great saphenous vein unremarkable. No filling defects to suggest DVT on grayscale or color Doppler imaging. Doppler waveforms show normal direction of venous flow, normal respiratory plasticity and response to augmentation. OTHER None. Limitations: none IMPRESSION: Negative. Electronically Signed   By: Fidela Salisbury M.D.   On: 04/03/2021 23:05   DG Chest Portable 1 View  Result Date: 04/03/2021 CLINICAL DATA:  Chest pain earlier today.  Lethargy. EXAM: PORTABLE CHEST 1 VIEW COMPARISON:  03/25/2021 FINDINGS: The cardiac silhouette remains borderline enlarged. Stable sternal/manubrial wires. Clear lungs with  normal vascularity. Mild thoracic spine degenerative changes. IMPRESSION: No acute abnormality. Electronically Signed   By: Claudie Revering M.D.   On: 04/03/2021 18:04        Scheduled Meds:  aspirin  324 mg Oral Once   aspirin EC  81 mg Oral Daily   DULoxetine  30 mg Oral Daily   fluticasone furoate-vilanterol  1 puff Inhalation Daily   heparin  5,000 Units Subcutaneous Q8H   lamoTRIgine  100 mg Oral QHS   LORazepam  0.5 mg Oral BID   metoprolol succinate  50 mg Oral BID   montelukast  10 mg Oral QHS   pantoprazole  40 mg Oral Daily   traZODone  150 mg Oral QHS   Continuous Infusions:  sodium chloride 75 mL/hr at 04/05/21 0659     LOS: 2 days    Time spent: 40 min    Desma Maxim, MD [facility]   If 7PM-7AM, please contact night-coverage [email or web address] Password [facility] 04/05/2021, 12:13 PM

## 2021-04-05 NOTE — Progress Notes (Signed)
PHARMACIST - PHYSICIAN COMMUNICATION  CONCERNING:  Enoxaparin (Lovenox) for DVT Prophylaxis    RECOMMENDATION: Patient was prescribed enoxaprin 40mg  q24 hours for VTE prophylaxis.   Filed Weights   04/03/21 1658  Weight: 111.2 kg (245 lb 2.4 oz)    Body mass index is 38.4 kg/m.  Estimated Creatinine Clearance: 74.8 mL/min (by C-G formula based on SCr of 1.03 mg/dL).   Based on [place] patient is candidate for enoxaparin 0.5mg /kg TBW SQ every 24 hours based on BMI being >30.  DESCRIPTION: Pharmacy has adjusted enoxaparin dose per [facility] policy.  Patient is now receiving enoxaparin 55 mg every 24 hours    Berta Minor, PharmD Clinical Pharmacist  04/05/2021 12:19 PM

## 2021-04-06 DIAGNOSIS — E871 Hypo-osmolality and hyponatremia: Secondary | ICD-10-CM | POA: Diagnosis not present

## 2021-04-06 LAB — CBC
HCT: 27.3 % — ABNORMAL LOW (ref 39.0–52.0)
Hemoglobin: 9.7 g/dL — ABNORMAL LOW (ref 13.0–17.0)
MCH: 33.2 pg (ref 26.0–34.0)
MCHC: 35.5 g/dL (ref 30.0–36.0)
MCV: 93.5 fL (ref 80.0–100.0)
Platelets: 145 10*3/uL — ABNORMAL LOW (ref 150–400)
RBC: 2.92 MIL/uL — ABNORMAL LOW (ref 4.22–5.81)
RDW: 13.3 % (ref 11.5–15.5)
WBC: 5.5 10*3/uL (ref 4.0–10.5)
nRBC: 0 % (ref 0.0–0.2)

## 2021-04-06 LAB — BASIC METABOLIC PANEL
Anion gap: 6 (ref 5–15)
BUN: 8 mg/dL (ref 8–23)
CO2: 22 mmol/L (ref 22–32)
Calcium: 8.9 mg/dL (ref 8.9–10.3)
Chloride: 97 mmol/L — ABNORMAL LOW (ref 98–111)
Creatinine, Ser: 0.92 mg/dL (ref 0.61–1.24)
GFR, Estimated: >60 mL/min (ref >60)
Glucose, Bld: 120 mg/dL — ABNORMAL HIGH (ref 70–99)
Potassium: 4.4 mmol/L (ref 3.5–5.1)
Sodium: 125 mmol/L — ABNORMAL LOW (ref 135–145)

## 2021-04-06 MED ORDER — DULOXETINE HCL 30 MG PO CPEP: ORAL_CAPSULE | ORAL | 3 refills | Status: DC

## 2021-04-06 NOTE — Plan of Care (Signed)
  Problem: Clinical Measurements: Goal: Respiratory complications will improve Outcome: Progressing Goal: Cardiovascular complication will be avoided Outcome: Progressing   

## 2021-04-06 NOTE — Progress Notes (Signed)
OT Cancellation Note  Patient Details Name: Johnny Mccoy MRN: [id number] DOB: [birth year]   Cancelled Treatment:    Reason Eval/Treat Not Completed: OT screened, no needs identified, will sign off. Order received and chart reviewed. Per chart review, pt performing functional transfers MOD-I and functional mobility of household distances with SUPERVISION. Per discussion with MD, no acute OT needs at this time. OT to sign off.   Fredirick Maudlin, OTR/L [place]

## 2021-04-06 NOTE — Discharge Summary (Signed)
[name]:[id number] DOB: June 29, 1945 DOA: 04/03/2021  PCP: Idelle Crouch, MD  Admit date: 04/03/2021 Discharge date: 04/06/2021  Time spent: 35 minutes  Recommendations for Outpatient Follow-up:  Pcp f/u 1-2 weeks, check sodium level then Advise taper off duloxetine and then re-assess sodium levels. If remain low, advise psychiatry f/u to consider switch to alternate agent Orthopedics f/u as scheduled     Discharge Diagnoses:  Principal Problem:   Hyponatremia Active Problems:   Essential hypertension   CAD (coronary artery disease)   AKI (acute kidney injury) ([place])   History of pulmonary embolism   Seizure ([facility])   Hypotension   Discharge Condition: stable  Diet recommendation: heart healthy  Filed Weights   04/03/21 1658  Weight: 111.2 kg    History of present illness:  From admission h and p by Dr. Posey Pronto: Johnny Mccoy is a 76 y.o. male with medical history significant of presenting with hyponatremia and has been sob and also has been having chest pain.    Hospital Course:  Recent hospitalization for right knee arthroplasty. Hospitalized a week ago with acute encephalopathy thought to be secondary to opioids. Has been feeling weak for about a week and presented to PCP on 2/2 for hospital f/u. Labs there showed hyponatremia worsened from baseline so patient sent to ED for further evaluation. 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 The subacute aspect of his hyponatremia was likely 2/2 dehydration as patient has mild AKI on presentation and AKI resolved with IVF and hyponatremia improved with IVF (from 118 to 125, with mid 120s being his more recent baseline). He is feeling well and desirous to go home. 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 Duloxetine was started relatively recently. Thus I advise he taper off that, and if hyponatremia does not resolve with that, to f/u with psychiatrist to consider switch to an alternate  anti-epileptic. Advise labs in 1 week to assess sodium level and ensure stable or improving. Return precautions reviewed. For his recent knee arthroplasty patient was seen by orthopedics and PT. Healing and function are good. He will f/u with orthopedics as scheduled and will continue outpatient physical therapy.     Procedures: none   Consultations: orthopedics  Discharge Exam: Vitals:   04/06/21 0421 04/06/21 0828  BP: (!) 113/47 117/76  Pulse: (!) 57 67  Resp: 18 18  Temp:  97.8 F (36.6 C)  SpO2: 97% 96%    General exam: Appears calm and comfortable  Respiratory system: Clear to auscultation. Respiratory effort normal. Cardiovascular system: S1 & S2 heard, RRR. No JVD, murmurs, rubs, gallops or clicks. No pedal edema. Gastrointestinal system: Abdomen is nondistended, soft and nontender. No organomegaly or masses felt. Normal bowel sounds heard. Central nervous system: Alert and oriented. No focal neurological deficits. Extremities: bandage right knee Skin: No rashes, lesions or ulcers Psychiatry: Judgement and insight appear normal. Mood & affect appropriate.   Discharge Instructions   Discharge Instructions     Diet - low sodium heart healthy   Complete by: As directed    Increase activity slowly   Complete by: As directed    Leave dressing on - Keep it clean, dry, and intact until clinic visit   Complete by: As directed       Allergies as of 04/06/2021       Reactions   Tramadol Other (See Comments)   CONTRAINDICATED PATIENT HAS HISTORY OF SEIZURE WILL LOWER SEIZURE THRESHOLD!!!!   Oxycodone    Other reaction(s):  Hallucination   Fentanyl Other (See Comments)   Possible involuntary tremors   Methocarbamol Other (See Comments)   confusion        Medication List     STOP taking these medications    memantine 28 MG Cp24 24 hr capsule Commonly known as: NAMENDA XR   sulfamethoxazole-trimethoprim 800-160 MG tablet Commonly known as: BACTRIM DS    torsemide 20 MG tablet Commonly known as: DEMADEX       TAKE these medications    albuterol 108 (90 Base) MCG/ACT inhaler Commonly known as: VENTOLIN HFA Inhale 2 puffs into the lungs every 4 (four) hours as needed for wheezing or shortness of breath.   aspirin 325 MG EC tablet Take 1 tablet (325 mg total) by mouth daily.   benzocaine 10 % mucosal gel Commonly known as: ORAJEL Use as directed in the mouth or throat 3 (three) times daily as needed for mouth pain.   Breo Ellipta 100-25 MCG/ACT Aepb Generic drug: fluticasone furoate-vilanterol Inhale 1 puff into the lungs daily.   budesonide 0.5 MG/2ML nebulizer solution Commonly known as: PULMICORT Take 0.5 mg by nebulization 2 (two) times daily as needed (shortness of breath).   celecoxib 200 MG capsule Commonly known as: CELEBREX Take 1 capsule (200 mg total) by mouth 2 (two) times daily.   cyanocobalamin 1000 MCG/ML injection Commonly known as: (VITAMIN B-12) Inject 1,000 mcg into the muscle every 30 (thirty) days.   DULoxetine 30 MG capsule Commonly known as: CYMBALTA Take one tab every-other-day for one week, then take one tab every 3 days for 1 week, then discontinue. What changed:  how much to take how to take this when to take this additional instructions   ergocalciferol 1.25 MG (50000 UT) capsule Commonly known as: VITAMIN D2 Take 50,000 Units by mouth every Tuesday.   gabapentin 400 MG capsule Commonly known as: NEURONTIN Take 1 capsule (400 mg total) by mouth at bedtime. What changed: when to take this   lamoTRIgine 100 MG tablet Commonly known as: LAMICTAL Take 100 mg by mouth at bedtime.   lansoprazole 30 MG capsule Commonly known as: PREVACID Take 30 mg by mouth in the morning and at bedtime.   LORazepam 0.5 MG tablet Commonly known as: ATIVAN Take 0.5 mg by mouth 2 (two) times daily.   Magnesium 500 MG Caps Take 1 capsule by mouth daily in the afternoon.   magnesium oxide 400 MG  tablet Commonly known as: MAG-OX Take 400 mg by mouth daily.   metoprolol succinate 50 MG 24 hr tablet Commonly known as: TOPROL-XL Take 50 mg by mouth 2 (two) times daily.   montelukast 10 MG tablet Commonly known as: SINGULAIR Take 10 mg by mouth at bedtime.   naloxone 4 MG/0.1ML Liqd nasal spray kit Commonly known as: NARCAN Place 1 spray into the nose once.   nitroGLYCERIN 0.4 MG/SPRAY spray Commonly known as: NITROLINGUAL Place 2 sprays under the tongue every 5 (five) minutes x 3 doses as needed for chest pain.   nystatin-triamcinolone cream Commonly known as: MYCOLOG II Apply 1 application topically 2 (two) times daily.   olmesartan 40 MG tablet Commonly known as: BENICAR Take 20 mg by mouth daily.   oxyCODONE 5 MG immediate release tablet Commonly known as: Oxy IR/ROXICODONE Take 1 tablet (5 mg total) by mouth every 8 (eight) hours as needed for severe pain (pain score 4-6).   Ozempic (0.25 or 0.5 MG/DOSE) 2 MG/1.5ML Sopn Generic drug: Semaglutide(0.25 or 0.5MG/DOS) Inject 0.5 mg into  the skin every Tuesday.   polyethylene glycol 17 g packet Commonly known as: MIRALAX / GLYCOLAX Take 17 g by mouth daily as needed for moderate constipation.   Red Yeast Rice 600 MG Caps Take 600 mg by mouth daily.   Repatha SureClick 254 MG/ML Soaj Generic drug: Evolocumab Inject 140 mg into the skin every 14 (fourteen) days.   traZODone 150 MG tablet Commonly known as: DESYREL Take 150 mg by mouth at bedtime.   valACYclovir 1000 MG tablet Commonly known as: VALTREX Take 1,000 mg by mouth daily as needed (Fever blisters).               Discharge Care Instructions  (From admission, onward)           Start     Ordered   04/06/21 0000  Leave dressing on - Keep it clean, dry, and intact until clinic visit        04/06/21 1029           Allergies  Allergen Reactions   Tramadol Other (See Comments)    CONTRAINDICATED PATIENT HAS HISTORY OF SEIZURE WILL  LOWER SEIZURE THRESHOLD!!!!   Oxycodone     Other reaction(s): Hallucination   Fentanyl Other (See Comments)    Possible involuntary tremors   Methocarbamol Other (See Comments)    confusion      The results of significant diagnostics from this hospitalization (including imaging, microbiology, ancillary and laboratory) are listed below for reference.    Significant Diagnostic Studies: CT Head Wo Contrast  Result Date: 03/25/2021 CLINICAL DATA:  Altered mental status. EXAM: CT HEAD WITHOUT CONTRAST TECHNIQUE: Contiguous axial images were obtained from the base of the skull through the vertex without intravenous contrast. RADIATION DOSE REDUCTION: This exam was performed according to the departmental dose-optimization program which includes automated exposure control, adjustment of the mA and/or kV according to patient size and/or use of iterative reconstruction technique. COMPARISON:  December 21, 2019. FINDINGS: Brain: No evidence of acute infarction, hemorrhage, hydrocephalus, extra-axial collection or mass lesion/mass effect. Vascular: No hyperdense vessel or unexpected calcification. Skull: Normal. Negative for fracture or focal lesion. Sinuses/Orbits: No acute finding. Other: None. IMPRESSION: No acute intracranial abnormality seen. Electronically Signed   By: Marijo Conception M.D.   On: 03/25/2021 16:15   CT KNEE RIGHT W CONTRAST  Result Date: 03/25/2021 CLINICAL DATA:  Status post right knee replacement.  Redness. EXAM: CT OF THE right KNEE WITH CONTRAST TECHNIQUE: Multidetector CT imaging was performed following the standard protocol during bolus administration of intravenous contrast. RADIATION DOSE REDUCTION: This exam was performed according to the departmental dose-optimization program which includes automated exposure control, adjustment of the mA and/or kV according to patient size and/or use of iterative reconstruction technique. CONTRAST:  42m OMNIPAQUE IOHEXOL 300 MG/ML  SOLN  COMPARISON:  Right knee radiograph dated 03/10/2021. FINDINGS: Evaluation is limited due to streak artifact caused by right knee arthroplasty. Bones/Joint/Cartilage There is no acute fracture or dislocation. 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 The arthroplasty components appear intact and in anatomic alignment. There is a large joint effusion which may be postoperative. An infectious process is not excluded clinical correlation is recommended. Ligaments Suboptimally assessed by CT. Muscles and Tendons No acute findings. Soft tissues Diffuse subcutaneous edema and postsurgical changes of the skin over the anterior knee. IMPRESSION: 1. Total right knee arthroplasty. 2. Large joint effusion. Clinical correlation is recommended to evaluate for possibility of an infectious process. Electronically Signed   By: AMilas Hock  Radparvar M.D.   On: 03/25/2021 20:37   US Venous Img Lower Bilateral (DVT)  Result Date: 04/03/2021 CLINICAL DATA:  Lower extremity edema EXAM: BILATERAL LOWER EXTREMITY VENOUS DOPPLER ULTRASOUND TECHNIQUE: Gray-scale sonography with compression, as well as color and duplex ultrasound, were performed to evaluate the deep venous system(s) from the level of the common femoral vein through the popliteal and proximal calf veins. COMPARISON:  None. FINDINGS: VENOUS Normal compressibility of the common femoral, superficial femoral, and popliteal veins, as well as the visualized calf veins. Visualized portions of profunda femoral vein and great saphenous vein unremarkable. No filling defects to suggest DVT on grayscale or color Doppler imaging. Doppler waveforms show normal direction of venous flow, normal respiratory plasticity and response to augmentation. OTHER None. Limitations: none IMPRESSION: Negative. Electronically Signed   By: Fidela Salisbury M.D.   On: 04/03/2021 23:05   DG Chest Portable 1 View  Result Date: 04/03/2021 CLINICAL DATA:  Chest pain earlier today.  Lethargy. EXAM: PORTABLE CHEST  1 VIEW COMPARISON:  03/25/2021 FINDINGS: The cardiac silhouette remains borderline enlarged. Stable sternal/manubrial wires. Clear lungs with normal vascularity. Mild thoracic spine degenerative changes. IMPRESSION: No acute abnormality. Electronically Signed   By: Claudie Revering M.D.   On: 04/03/2021 18:04   DG Chest Port 1 View  Result Date: 03/25/2021 CLINICAL DATA:  SOB EXAM: PORTABLE CHEST 1 VIEW COMPARISON:  April 2022 FINDINGS: The heart appears at the upper limit of normal given projection. No pleural effusion. No pneumothorax. No mass or consolidation. Median sternotomy. IMPRESSION: No acute findings in the chest.  No consolidative pneumonia. Electronically Signed   By: Albin Felling M.D.   On: 03/25/2021 16:05   DG Knee Right Port  Result Date: 03/10/2021 CLINICAL DATA:  Postop knee replacement EXAM: PORTABLE RIGHT KNEE - 1-2 VIEW COMPARISON:  None. FINDINGS: Status post right knee replacement with intact hardware and normal alignment. Gas in the soft tissues consistent with recent surgery. IMPRESSION: Status post right knee replacement with expected surgical changes Electronically Signed   By: Donavan Foil M.D.   On: 03/10/2021 19:32   ECHOCARDIOGRAM COMPLETE  Result Date: 03/27/2021    ECHOCARDIOGRAM REPORT   Patient Name:   Johnny Mccoy Selbe Date of Exam: 03/26/2021 Medical Rec #:  [id number]      Height:       67.0 in Accession #:    [id number]     Weight:       233.5 lb Date of Birth:  [birth year]       BSA:          2.160 m Patient Age:    74 years       BP:           108/48 mmHg Patient Gender: M              HR:           80 bpm. Exam Location:  [facility] Procedure: 2D Echo, Color Doppler, Cardiac Doppler and Intracardiac            Opacification Agent Indications:     I50.31 congestive heart failure-Acute Diastolic  History:         Patient has no prior history of Echocardiogram examinations.                  HFpEF, CAD, Stroke; Risk Factors:Hypertension, Dyslipidemia and                  Sleep  Apnea.  Sonographer:  Charmayne Sheer Referring Phys:  Alligator Diagnosing Phys: Yolonda Kida MD  Sonographer Comments: Suboptimal apical window and no subcostal window. IMPRESSIONS  1. Left ventricular ejection fraction, by estimation, is 55 to 60%. The left ventricle has normal function. The left ventricle has no regional wall motion abnormalities. Left ventricular diastolic parameters are consistent with Grade I diastolic dysfunction (impaired relaxation).  2. Right ventricular systolic function is normal. The right ventricular size is mildly enlarged.  3. Left atrial size was mildly dilated.  4. Right atrial size was mildly dilated.  5. The mitral valve is normal in structure. Trivial mitral valve regurgitation.  6. The aortic valve is normal in structure. There is moderate calcification of the aortic valve. There is mild thickening of the aortic valve. Aortic valve regurgitation is not visualized. Mild aortic valve stenosis. FINDINGS  Left Ventricle: Left ventricular ejection fraction, by estimation, is 55 to 60%. The left ventricle has normal function. The left ventricle has no regional wall motion abnormalities. Definity contrast agent was given IV to delineate the left ventricular  endocardial borders. The left ventricular internal cavity size was normal in size. There is no left ventricular hypertrophy. Left ventricular diastolic parameters are consistent with Grade I diastolic dysfunction (impaired relaxation). Right Ventricle: The right ventricular size is mildly enlarged. No increase in right ventricular wall thickness. Right ventricular systolic function is normal. Left Atrium: Left atrial size was mildly dilated. Right Atrium: Right atrial size was mildly dilated. Pericardium: There is no evidence of pericardial effusion. Mitral Valve: The mitral valve is normal in structure. There is mild thickening of the mitral valve leaflet(s). There is mild calcification of the mitral valve  leaflet(s). Mild to moderate mitral annular calcification. Trivial mitral valve regurgitation.  MV peak gradient, 5.8 mmHg. The mean mitral valve gradient is 2.0 mmHg. Tricuspid Valve: The tricuspid valve is normal in structure. Tricuspid valve regurgitation is trivial. Aortic Valve: The aortic valve is normal in structure. There is moderate calcification of the aortic valve. There is mild thickening of the aortic valve. Aortic valve regurgitation is not visualized. Mild aortic stenosis is present. Aortic valve mean gradient measures 10.0 mmHg. Aortic valve peak gradient measures 20.1 mmHg. Aortic valve area, by VTI measures 1.40 cm. Pulmonic Valve: The pulmonic valve was normal in structure. Pulmonic valve regurgitation is not visualized. Aorta: The ascending aorta was not well visualized. IAS/Shunts: No atrial level shunt detected by color flow Doppler.  LEFT VENTRICLE PLAX 2D LVIDd:         4.68 cm   Diastology LVIDs:         3.29 cm   LV e' medial:    5.66 cm/s LV PW:         1.28 cm   LV E/e' medial:  16.3 LV IVS:        1.04 cm   LV e' lateral:   9.25 cm/s LVOT diam:     1.90 cm   LV E/e' lateral: 10.0 LV SV:         50 LV SV Index:   23 LVOT Area:     2.84 cm  RIGHT VENTRICLE RV Basal diam:  4.65 cm LEFT ATRIUM         Index       RIGHT ATRIUM           Index LA diam:    4.00 cm 1.85 cm/m  RA Area:     21.00 cm  RA Volume:   55.20 ml  25.56 ml/m  AORTIC VALVE                     PULMONIC VALVE AV Area (Vmax):    1.09 cm      PV Vmax:       1.22 m/s AV Area (Vmean):   1.25 cm      PV Vmean:      84.200 cm/s AV Area (VTI):     1.40 cm      PV VTI:        0.203 m AV Vmax:           224.25 cm/s   PV Peak grad:  6.0 mmHg AV Vmean:          147.500 cm/s  PV Mean grad:  3.0 mmHg AV VTI:            0.356 m AV Peak Grad:      20.1 mmHg AV Mean Grad:      10.0 mmHg LVOT Vmax:         86.20 cm/s LVOT Vmean:        65.100 cm/s LVOT VTI:          0.175 m LVOT/AV VTI ratio: 0.49   AORTA Ao Root diam: 3.70 cm MITRAL VALVE                TRICUSPID VALVE MV Area (PHT): 3.39 cm     TR Peak grad:   30.7 mmHg MV Area VTI:   1.51 cm     TR Vmax:        277.00 cm/s MV Peak grad:  5.8 mmHg MV Mean grad:  2.0 mmHg     SHUNTS MV Vmax:       1.20 m/s     Systemic VTI:  0.18 m MV Vmean:      74.4 cm/s    Systemic Diam: 1.90 cm MV Decel Time: 224 msec MV E velocity: 92.10 cm/s MV A velocity: 104.00 cm/s MV E/A ratio:  0.89 Dwayne D Callwood MD Electronically signed by Yolonda Kida MD Signature Date/Time: 03/27/2021/8:29:35 AM    Final     Microbiology: Recent Results (from the past 240 hour(s))  Resp Panel by RT-PCR (Flu A&B, Covid) Nasopharyngeal Swab     Status: None   Collection Time: 04/03/21  6:35 PM   Specimen: Nasopharyngeal Swab; Nasopharyngeal(NP) swabs in vial transport medium  Result Value Ref Range Status   SARS Coronavirus 2 by RT PCR NEGATIVE NEGATIVE Final    Comment: (NOTE) SARS-CoV-2 target nucleic acids are NOT DETECTED.  The SARS-CoV-2 RNA is generally detectable in upper respiratory specimens during the acute phase of infection. The lowest concentration of SARS-CoV-2 viral copies this assay can detect is 138 copies/mL. A negative result does not preclude SARS-Cov-2 infection and should not be used as the sole basis for treatment or other patient management decisions. A negative result may occur with  improper specimen collection/handling, submission of specimen other than nasopharyngeal swab, presence of viral mutation(s) within the areas targeted by this assay, and inadequate number of viral copies(<138 copies/mL). A negative result must be combined with clinical observations, patient history, and epidemiological information. The expected result is Negative.  Fact Sheet for Patients:  EntrepreneurPulse.com.au  Fact Sheet for Healthcare Providers:  IncredibleEmployment.be  This test is no t yet approved or cleared  by the [place] FDA and  has been authorized for detection and/or  diagnosis of SARS-CoV-2 by FDA under an Emergency Use Authorization (EUA). This EUA will remain  in effect (meaning this test can be used) for the duration of the COVID-19 declaration under Section 564(b)(1) of the Act, 21 U.S.C.section 360bbb-3(b)(1), unless the authorization is terminated  or revoked sooner.       Influenza A by PCR NEGATIVE NEGATIVE Final   Influenza B by PCR NEGATIVE NEGATIVE Final    Comment: (NOTE) The Xpert Xpress SARS-CoV-2/FLU/RSV plus assay is intended as an aid in the diagnosis of influenza from Nasopharyngeal swab specimens and should not be used as a sole basis for treatment. Nasal washings and aspirates are unacceptable for Xpert Xpress SARS-CoV-2/FLU/RSV testing.  Fact Sheet for Patients: EntrepreneurPulse.com.au  Fact Sheet for Healthcare Providers: IncredibleEmployment.be  This test is not yet approved or cleared by the [place] FDA and has been authorized for detection and/or diagnosis of SARS-CoV-2 by FDA under an Emergency Use Authorization (EUA). This EUA will remain in effect (meaning this test can be used) for the duration of the COVID-19 declaration under Section 564(b)(1) of the Act, 21 U.S.C. section 360bbb-3(b)(1), unless the authorization is terminated or revoked.  Performed at [facility], [address] [place], [place] [address]      Labs: Basic Metabolic Panel: Recent Labs  Lab 04/03/21 1753 04/04/21 1005 04/05/21 0516 04/06/21 0512  NA 118* 122* 123* 125*  K 4.9 4.7 4.5 4.4  CL 88* 93* 93* 97*  CO2 _0 GLUCOSE 138* 129* 106* 120*  BUN _1 CREATININE 1.31* 1.01 1.03 0.92  CALCIUM 8.8* 8.8* 8.7* 8.9   Liver Function Tests: No results for input(s): AST, ALT, ALKPHOS, BILITOT, PROT, ALBUMIN in the last 168 hours. No results for input(s): LIPASE, AMYLASE in the last 168  hours. No results for input(s): AMMONIA in the last 168 hours. CBC: Recent Labs  Lab 04/04/21 0021 04/06/21 0512  WBC 6.2 5.5  HGB 10.6* 9.7*  HCT 29.7* 27.3*  MCV 93.1 93.5  PLT 165 145*   Cardiac Enzymes: No results for input(s): CKTOTAL, CKMB, CKMBINDEX, TROPONINI in the last 168 hours. BNP: BNP (last 3 results) Recent Labs    06/10/20 1656 03/26/21 0233 04/03/21 1753  BNP 17.7 188.3* 54.5    ProBNP (last 3 results) No results for input(s): PROBNP in the last 8760 hours.  CBG: No results for input(s): GLUCAP in the last 168 hours.     Signed:  Desma Maxim MD.  [facility] 04/06/2021, 10:29 AM

## 2021-04-06 NOTE — Evaluation (Signed)
Physical Therapy Evaluation Patient Details Name: Johnny Mccoy MRN: [id number] DOB: January 24, 1946 Today's Date: 04/06/2021  History of Present Illness  Pt is a 76 yo M with recent hospitalization for right knee arthroplasty 03/10/21. Hospitalized again a week ago with acute encephalopathy thought to be secondary to opioids. Has been feeling weak for about a week and presented to PCP on 2/2 for hospital f/u. Labs there showed hyponatremia worsened from baseline so patient sent to ED for further evaluation. MD assessment includes: hyponatremia, AKI, and hypotension.  PMH incudes seizure disorder, CHF, CVA, back pain, and HTN.   Clinical Impression  Pt was pleasant and motivated to participate during the session and put forth good effort throughout. Pt required no physical assistance during the session and was steady with transfers and gait.  Pt was able to amb with fair cadence and reciprocal gait pattern with no adverse symptoms other than mod R knee pain with SpO2 and HR WNL.  Pt had just begun to receive OPPT for his recent R TKA revision prior to this admission.  Pt will benefit from continued OPPT upon discharge to safely address deficits listed in patient problem list for decreased caregiver assistance and eventual return to PLOF.         Recommendations for follow up therapy are one component of a multi-disciplinary discharge planning process, led by the attending physician.  Recommendations may be updated based on patient status, additional functional criteria and insurance authorization.  Follow Up Recommendations Outpatient PT    Assistance Recommended at Discharge Intermittent Supervision/Assistance  Patient can return home with the following  Assistance with cooking/housework;Assist for transportation;A little help with walking and/or transfers;Help with stairs or ramp for entrance    Equipment Recommendations None recommended by PT  Recommendations for Other Services       Functional  Status Assessment Patient has had a recent decline in their functional status and demonstrates the ability to make significant improvements in function in a reasonable and predictable amount of time.     Precautions / Restrictions Precautions Precautions: Fall Restrictions Weight Bearing Restrictions: No RLE Weight Bearing: Weight bearing as tolerated      Mobility  Bed Mobility Overal bed mobility: Modified Independent             General bed mobility comments: Min extra time only    Transfers Overall transfer level: Modified independent Equipment used: Rolling walker (2 wheels) Transfers: Sit to/from Stand Sit to Stand: Modified independent (Device/Increase time)           General transfer comment: Very good eccentric and concentric control and stability    Ambulation/Gait Ambulation/Gait assistance: Supervision Gait Distance (Feet): 100 Feet Assistive device: Rolling walker (2 wheels) Gait Pattern/deviations: Step-through pattern, Decreased step length - right, Decreased step length - left Gait velocity: decreased     General Gait Details: Mildy reduced cadence but steady without LOB or buckling including during 180 deg turns  Financial trader Rankin (Stroke Patients Only)       Balance Overall balance assessment: No apparent balance deficits (not formally assessed)                                           Pertinent Vitals/Pain Pain Assessment Pain Assessment: 0-10 Pain Score: 6  Pain Location: R knee  Pain Descriptors / Indicators: Sore Pain Intervention(s): Repositioned, Premedicated before session, Monitored during session    Home Living Family/patient expects to be discharged to:: Private residence Living Arrangements: Spouse/significant other Available Help at Discharge: Available 24 hours/day;Family Type of Home: House Home Access: Stairs to enter Entrance Stairs-Rails:  Right Entrance Stairs-Number of Steps: 4 Alternate Level Stairs-Number of Steps: 1 into bedroom Home Layout: Multi-level;Able to live on main level with bedroom/bathroom Home Equipment: Rollator (4 wheels);Shower seat - built in;Cane - single point;Wheelchair - Publishing copy (2 wheels)      Prior Function Prior Level of Function : Independent/Modified Independent             Mobility Comments: Pt able to be active and relatively independent pre TKA revision, has been recoverying well working with PT and has begun to amb limited distances without an AD.       Hand Dominance        Extremity/Trunk Assessment   Upper Extremity Assessment Upper Extremity Assessment: Overall WFL for tasks assessed    Lower Extremity Assessment Lower Extremity Assessment: Generalized weakness       Communication   Communication: HOH  Cognition Arousal/Alertness: Awake/alert Behavior During Therapy: WFL for tasks assessed/performed Overall Cognitive Status: Within Functional Limits for tasks assessed                                          General Comments      Exercises Total Joint Exercises Quad Sets: Strengthening, 10 reps, Right Long Arc Quad: Strengthening, 10 reps, 5 reps, Right Knee Flexion: Strengthening, Right, 5 reps, 10 reps Marching in Standing: AROM, Strengthening, Both, 5 reps Other Exercises Other Exercises: HEP education/review for RLE QS and seated knee flex Other Exercises: Positioning education to promoted R knee ext PROM at rest   Assessment/Plan    PT Assessment Patient needs continued PT services  PT Problem List Decreased strength;Pain;Decreased range of motion;Decreased activity tolerance;Decreased balance;Decreased mobility       PT Treatment Interventions DME instruction;Therapeutic exercise;Gait training;Balance training;Stair training;Functional mobility training;Therapeutic activities;Patient/family education    PT Goals  (Current goals can be found in the Care Plan section)  Acute Rehab PT Goals Patient Stated Goal: To get stronger PT Goal Formulation: With patient Time For Goal Achievement: 04/19/21 Potential to Achieve Goals: Good    Frequency Min 2X/week     Co-evaluation               AM-PAC PT "6 Clicks" Mobility  Outcome Measure Help needed turning from your back to your side while in a flat bed without using bedrails?: None Help needed moving from lying on your back to sitting on the side of a flat bed without using bedrails?: None Help needed moving to and from a bed to a chair (including a wheelchair)?: None Help needed standing up from a chair using your arms (e.g., wheelchair or bedside chair)?: None Help needed to walk in hospital room?: A Little Help needed climbing 3-5 steps with a railing? : A Little 6 Click Score: 22    End of Session Equipment Utilized During Treatment: Gait belt Activity Tolerance: Patient tolerated treatment well Patient left: in chair;with call bell/phone within reach;with chair alarm set;with family/visitor present Nurse Communication: Mobility status PT Visit Diagnosis: Other abnormalities of gait and mobility (R26.89);Muscle weakness (generalized) (M62.81);Pain Pain - Right/Left: Right Pain - part of body: Knee  Time: 9672-2773 PT Time Calculation (min) (ACUTE ONLY): 32 min   Charges:   PT Evaluation $PT Eval Moderate Complexity: 1 Mod PT Treatments $Therapeutic Exercise: 8-22 mins        D. Scott Romanda Turrubiates PT, DPT 04/06/21, 11:39 AM

## 2021-04-06 NOTE — Progress Notes (Signed)
Discharge instructions, RX's and follow up appts explained and provided to patient verbalized understanding. Patient left floor via wheelchair accompanied by staff no c/o of pain or shortness of breath at d/c.   Oswaldo Cueto, Tivis Ringer, RN

## 2021-04-06 NOTE — Plan of Care (Signed)
°  Problem: Clinical Measurements: Goal: Respiratory complications will improve 04/06/2021 1217 by Emmaline Life, RN Outcome: Adequate for Discharge 04/06/2021 1051 by Emmaline Life, RN Outcome: Progressing Goal: Cardiovascular complication will be avoided 04/06/2021 1217 by Emmaline Life, RN Outcome: Adequate for Discharge 04/06/2021 1051 by Emmaline Life, RN Outcome: Progressing   Problem: Clinical Measurements: Goal: Cardiovascular complication will be avoided 04/06/2021 1217 by Emmaline Life, RN Outcome: Adequate for Discharge 04/06/2021 1051 by Emmaline Life, RN Outcome: Progressing   Problem: Activity: Goal: Risk for activity intolerance will decrease Outcome: Adequate for Discharge   Problem: Nutrition: Goal: Adequate nutrition will be maintained Outcome: Adequate for Discharge   Problem: Coping: Goal: Level of anxiety will decrease Outcome: Adequate for Discharge   Problem: Elimination: Goal: Will not experience complications related to bowel motility Outcome: Adequate for Discharge Goal: Will not experience complications related to urinary retention Outcome: Adequate for Discharge   Problem: Pain Managment: Goal: General experience of comfort will improve Outcome: Adequate for Discharge   Problem: Safety: Goal: Ability to remain free from injury will improve Outcome: Adequate for Discharge   Problem: Skin Integrity: Goal: Risk for impaired skin integrity will decrease Outcome: Adequate for Discharge   Problem: Acute Rehab PT Goals(only PT should resolve) Goal: Pt Will Transfer Bed To Chair/Chair To Bed Outcome: Adequate for Discharge Goal: Pt Will Ambulate Outcome: Adequate for Discharge Goal: Pt Will Go Up/Down Stairs Outcome: Adequate for Discharge

## 2021-04-06 NOTE — Plan of Care (Signed)
°  Problem: Clinical Measurements: Goal: Respiratory complications will improve Outcome: Progressing Goal: Cardiovascular complication will be avoided Outcome: Progressing   Problem: Activity: Goal: Risk for activity intolerance will decrease Outcome: Progressing   Problem: Nutrition: Goal: Adequate nutrition will be maintained Outcome: Progressing   Problem: Coping: Goal: Level of anxiety will decrease Outcome: Progressing   Problem: Elimination: Goal: Will not experience complications related to bowel motility Outcome: Progressing Goal: Will not experience complications related to urinary retention Outcome: Progressing   Problem: Pain Managment: Goal: General experience of comfort will improve Outcome: Progressing   Problem: Safety: Goal: Ability to remain free from injury will improve Outcome: Progressing   Problem: Skin Integrity: Goal: Risk for impaired skin integrity will decrease Outcome: Progressing

## 2021-04-07 LAB — LAMOTRIGINE LEVEL: Lamotrigine Lvl: 2.6 ug/mL (ref 2.0–20.0)

## 2021-04-21 ENCOUNTER — Encounter (INDEPENDENT_AMBULATORY_CARE_PROVIDER_SITE_OTHER): Payer: Medicare Other

## 2021-04-21 ENCOUNTER — Ambulatory Visit (INDEPENDENT_AMBULATORY_CARE_PROVIDER_SITE_OTHER): Payer: Medicare Other | Admitting: Vascular Surgery

## 2021-05-09 ENCOUNTER — Telehealth (INDEPENDENT_AMBULATORY_CARE_PROVIDER_SITE_OTHER): Payer: Self-pay | Admitting: Vascular Surgery

## 2021-05-09 NOTE — Telephone Encounter (Signed)
Yes, that is good for cellulitis and typically what we use for first line therapy

## 2021-05-09 NOTE — Telephone Encounter (Signed)
[facility] consulted with Dr. Marry Guan regarding RX of  cephalexin 500 mg. They agreed that this was a good RX for pt's RLE cellulitis. PA Tamala Julian put in a referral but pt has an appt on May 19, 2021. with GS. Pt is just wanting confirmation from [place] that this is a good RX until he is in the office to see GS. Pt is going out of town all next week. Please advise pt @ [phone] ?

## 2021-05-15 ENCOUNTER — Other Ambulatory Visit (HOSPITAL_COMMUNITY): Payer: Self-pay | Admitting: Physician Assistant

## 2021-05-15 ENCOUNTER — Other Ambulatory Visit (HOSPITAL_COMMUNITY): Payer: Self-pay | Admitting: Pulmonary Disease

## 2021-05-15 ENCOUNTER — Other Ambulatory Visit: Payer: Self-pay | Admitting: Pulmonary Disease

## 2021-05-15 ENCOUNTER — Other Ambulatory Visit: Payer: Self-pay | Admitting: Physician Assistant

## 2021-05-16 ENCOUNTER — Other Ambulatory Visit (INDEPENDENT_AMBULATORY_CARE_PROVIDER_SITE_OTHER): Payer: Self-pay | Admitting: Vascular Surgery

## 2021-05-16 DIAGNOSIS — Z86718 Personal history of other venous thrombosis and embolism: Secondary | ICD-10-CM

## 2021-05-16 DIAGNOSIS — Z95828 Presence of other vascular implants and grafts: Secondary | ICD-10-CM

## 2021-05-19 ENCOUNTER — Encounter (INDEPENDENT_AMBULATORY_CARE_PROVIDER_SITE_OTHER): Payer: Self-pay | Admitting: Vascular Surgery

## 2021-05-19 ENCOUNTER — Other Ambulatory Visit: Payer: Self-pay

## 2021-05-19 ENCOUNTER — Ambulatory Visit (INDEPENDENT_AMBULATORY_CARE_PROVIDER_SITE_OTHER): Payer: Medicare Other | Admitting: Vascular Surgery

## 2021-05-19 ENCOUNTER — Encounter (INDEPENDENT_AMBULATORY_CARE_PROVIDER_SITE_OTHER): Payer: Medicare Other

## 2021-05-19 VITALS — BP 144/83 | HR 76 | Resp 16 | Wt 220.0 lb

## 2021-05-19 DIAGNOSIS — I10 Essential (primary) hypertension: Secondary | ICD-10-CM

## 2021-05-19 DIAGNOSIS — Z95828 Presence of other vascular implants and grafts: Secondary | ICD-10-CM | POA: Diagnosis not present

## 2021-05-19 DIAGNOSIS — I2510 Atherosclerotic heart disease of native coronary artery without angina pectoris: Secondary | ICD-10-CM | POA: Diagnosis not present

## 2021-05-19 DIAGNOSIS — I872 Venous insufficiency (chronic) (peripheral): Secondary | ICD-10-CM | POA: Diagnosis not present

## 2021-05-19 DIAGNOSIS — Z86718 Personal history of other venous thrombosis and embolism: Secondary | ICD-10-CM

## 2021-05-19 DIAGNOSIS — Z96659 Presence of unspecified artificial knee joint: Secondary | ICD-10-CM

## 2021-05-19 DIAGNOSIS — K219 Gastro-esophageal reflux disease without esophagitis: Secondary | ICD-10-CM

## 2021-05-19 NOTE — H&P (View-Only) (Signed)
? ? ?MRN : [id number] ? ?Johnny Mccoy is a 76 y.o. (1945-03-23) male who presents with chief complaint of follow up for IVC filter. ? ?History of Present Illness:  ? The patient presents to the office for evaluation s/p right redo TKR with a history of DVT.  Knee replacement was 03/10/2021.  IVC filter was placed 03/04/2021.   ? ?The patient notes the leg continues to be painful with dependency and swells some.  Symptoms are much better with elevation.  The patient notes minimal edema in the morning which steadily worsens throughout the day.   ? ?The patient has not been using compression therapy at this point. ? ?No SOB or pleuritic chest pains.  No cough or hemoptysis. ? ?No blood per rectum or blood in any sputum.  No excessive bruising per the patient. ?  ? ?No outpatient medications have been marked as taking for the 05/19/21 encounter (Appointment) with Delana Meyer, Dolores Lory, MD.  ? ? ?Past Medical History:  ?Diagnosis Date  ? (HFpEF) heart failure with preserved ejection fraction ([place]) 02/27/2014  ? a.) TTE 02/27/14: EF >55%; mod LVH; mild panvalvular regurgitation; (+) global stress induced HK. b.) TTE 09/20/14: EF 55%; triv AR/MR, mild TR/PR; mild AS; BAE; c.) TTE 06/20/15: EF 55%, triv AR/MR; BAE; mild AS. d.) TTE 02/11/17: EF 55; significant  RV dilation with mod systolic dysfunction; mild AS. e.) TTE 02/27/19: EF 55%; mild AS. f.) TTE 06/04/20: EF 50%; mild AS  ? Anxiety   ? Aortic stenosis 09/20/2014  ? a.) TTE 09/20/2014: mild; MPG?. b.) TTE 06/20/2015: mild; MPG 8.0 mmHg. c.) TTE 02/11/2017: mild; MPG 12.4 mmHg. d.) TTE 02/27/2019: mild; MPG 11.4 mmHg. e.) TTE 06/04/2020: mild; MPG 9.6 mmHg.  ? Arthritis   ? Asthma   ? CAD (coronary artery disease)   ? Cataracts, bilateral   ? Chronic back pain   ? Chronic venous insufficiency   ? Complication of anesthesia   ? a.) postoperative amnesia (1999). b.) seizures after partial knee in 2014; sent to [facility] x 5 days d/t uncontrollable shaking and AMS; ?? fentanyl  induced tremors. c.) anesthesia awareness  ? COVID-19 05/2018  ? CVA (cerebral vascular accident) [facility])   ? Depressed   ? Essential tremor   ? GERD (gastroesophageal reflux disease)   ? Hepatitis B   ? a.) h/o in the 90s; reports not active at present  ? History of bronchitis   ? Jan-Mar 2017  ? History of hiatal hernia   ? Hyperlipidemia   ? Hypertension   ? Insomnia   ? Joint pain   ? Lymphedema   ? Mild cognitive impairment   ? Muscle spasm   ? in neck.Takes Flexeril as needed  ? Numbness   ? right knee,right hand,and toes on left  ? RBBB   ? Seizures ([place])   ? [place]. takes Lamotrigine daily. Last seizure 6+ yrs ago  ? Sleep apnea with use of continuous positive airway pressure (CPAP)   ? VTE (venous thromboembolism)   ? a.) s/p RIGHT TKA  ? ? ?Past Surgical History:  ?Procedure Laterality Date  ? ANKLE ARTHROSCOPY Left 03/07/2020  ? Procedure: ANKLE ARTHROSCOPY;  Surgeon: Caroline More, DPM;  Location: [place];  Service: Podiatry;  Laterality: Left;  ? BACK SURGERY    ? x3  ? BLEPHAROPLASTY    ? CARDIAC CATHETERIZATION    ? 2015  ? CARPAL TUNNEL RELEASE    ? COLONOSCOPY    ? CORONARY  ANGIOPLASTY WITH STENT PLACEMENT    ? x 3  ? CORONARY ANGIOPLASTY WITH STENT PLACEMENT Left 01/30/2003  ? Procedure: CORONARY ANGIOPLASTY WITH STENT PLACEMENT (2.75 x 32 mm Taxus stent to RCA); Location: [facility]; Surgeon: Courtney Paris, MD  ? dental implants    ? ESOPHAGOGASTRODUODENOSCOPY    ? EYE SURGERY    ? both eyes in 60's d/t muscle causing to squint  ? IVC FILTER INSERTION N/A 03/04/2021  ? Procedure: IVC FILTER INSERTION;  Surgeon: Katha Cabal, MD;  Location: [place] [facility];  Service: Cardiovascular;  Laterality: N/A;  ? JOINT REPLACEMENT Right   ? knee  ? KNEE ARTHROSCOPY    ? LEFT HEART CATH AND CORONARY ANGIOGRAPHY Left 04/12/2019  ? Procedure: LEFT HEART CATH AND CORONARY ANGIOGRAPHY;  Surgeon: Corey Skains, MD;  Location: [place] [facility];  Service: Cardiovascular;  Laterality: Left;   ? MEDIAL PARTIAL KNEE REPLACEMENT Right   ? MEDIASTERNOTOMY N/A 10/28/2015  ? Procedure: PARTIAL STERNOTOMY;  Surgeon: [name], MD;  Location: [place];  Service: Thoracic;  Laterality: N/A;  ? numerous hand surgery    ? ORIF ANKLE FRACTURE Left 03/07/2020  ? Procedure: OPEN REDUCTION INTERNAL FIXATION (ORIF) ANKLE FRACTURE;  Surgeon: Caroline More, DPM;  Location: [place];  Service: Podiatry;  Laterality: Left;  ? 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  ? Procedure: RESECTION OF anterier MEDIASTINAL TUMOR;  Surgeon: [name], MD;  Location: [place];  Service: Thoracic;  Laterality: N/A;  ? right hand fusion    ? rotorblator  03/1997  ? anteriograde amnesia resulted  ? TENDON REPAIR Left 03/07/2020  ? Procedure: FLEXOR TENDON REPAIR;  Surgeon: Caroline More, DPM;  Location: [place];  Service: Podiatry;  Laterality: Left;  sleep apnea  ? TONSILLECTOMY    ? TOTAL KNEE REVISION Right 03/10/2021  ? Procedure: TOTAL KNEE REVISION;  Surgeon: Dereck Leep, MD;  Location: [facility];  Service: Orthopedics;  Laterality: Right;  ? UMBILICAL HERNIA REPAIR    ? ? ?Social History ?Social History  ? ?Tobacco Use  ? Smoking status: Never  ? Smokeless tobacco: Never  ?Vaping Use  ? Vaping Use: Never used  ?Substance Use Topics  ? Alcohol use: Yes  ?  Alcohol/week: 14.0 standard drinks  ?  Types: 14 Shots of liquor per week  ?  Comment: wine and couple shots of liquor each evening  ? Drug use: No  ? ? ?Family History ?Family History  ?Problem Relation Age of Onset  ? Cancer Father 33  ?     Bone   ? COPD Father   ? Heart disease Mother   ? Hyperlipidemia Mother   ? Stroke Mother   ? Heart disease Maternal Grandfather   ? Hyperlipidemia Maternal Grandfather   ? ? ?Allergies  ?Allergen Reactions  ? Tramadol Other (See Comments)  ?  CONTRAINDICATED PATIENT HAS HISTORY OF SEIZURE WILL LOWER SEIZURE THRESHOLD!!!!  ? Oxycodone   ?  Other reaction(s): Hallucination  ? Fentanyl Other (See  Comments)  ?  Possible involuntary tremors  ? Methocarbamol Other (See Comments)  ?  confusion  ? ? ? ?REVIEW OF SYSTEMS (Negative unless checked) ? ?Constitutional: '[]'$ Weight loss  '[]'$ Fever  '[]'$ Chills ?Cardiac: '[]'$ Chest pain   '[]'$ Chest pressure   '[]'$ Palpitations   '[]'$ Shortness of breath when laying flat   '[]'$ Shortness of breath with exertion. ?Vascular:  '[]'$ Pain in legs with walking   '[]'$ Pain in legs at rest  '[x]'$ History of  DVT   '[]'$ Phlebitis   '[x]'$ Swelling in legs   '[]'$ Varicose veins   '[]'$ Non-healing ulcers ?Pulmonary:   '[]'$ Uses home oxygen   '[]'$ Productive cough   '[]'$ Hemoptysis   '[]'$ Wheeze  '[]'$ COPD   '[]'$ Asthma ?Neurologic:  '[]'$ Dizziness   '[]'$ Seizures   '[]'$ History of stroke   '[]'$ History of TIA  '[]'$ Aphasia   '[]'$ Vissual changes   '[]'$ Weakness or numbness in arm   '[]'$ Weakness or numbness in leg ?Musculoskeletal:   '[]'$ Joint swelling   '[x]'$ Joint pain   '[]'$ Low back pain ?Hematologic:  '[]'$ Easy bruising  '[]'$ Easy bleeding   '[]'$ Hypercoagulable state   '[]'$ Anemic ?Gastrointestinal:  '[]'$ Diarrhea   '[]'$ Vomiting  '[x]'$ Gastroesophageal reflux/heartburn   '[]'$ Difficulty swallowing. ?Genitourinary:  '[]'$ Chronic kidney disease   '[]'$ Difficult urination  '[]'$ Frequent urination   '[]'$ Blood in urine ?Skin:  '[]'$ Rashes   '[]'$ Ulcers  ?Psychological:  '[]'$ History of anxiety   '[]'$  History of major depression. ? ?Physical Examination ? ?There were no vitals filed for this visit. ?There is no height or weight on file to calculate BMI. ?Gen: WD/WN, NAD ?Head: [place]/AT, No temporalis wasting.  ?Ear/Nose/Throat: Hearing grossly intact, nares w/o erythema or drainage, pinna without lesions ?Eyes: PER, EOMI, sclera nonicteric.  ?Neck: Supple, no gross masses.  No JVD.  ?Pulmonary:  Good air movement, no audible wheezing, no use of accessory muscles.  ?Cardiac: RRR, precordium not hyperdynamic. ?Vascular:  scattered varicosities present bilaterally.  Mild venous stasis changes to the legs bilaterally.  2+ soft pitting edema  ?Vessel Right Left  ?Radial Palpable Palpable  ?Gastrointestinal: soft,  non-distended. No guarding/no peritoneal signs.  ?Musculoskeletal: M/S 5/5 throughout.  No deformity.  ?Neurologic: CN 2-12 intact. Pain and light touch intact in extremities.  Symmetrical.  Speech is fluent. Motor exam as li

## 2021-05-19 NOTE — Progress Notes (Signed)
MRN : [id number]  Johnny Mccoy is a 76 y.o. (09-23-1945) male who presents with chief complaint of follow up for IVC filter.  History of Present Illness:   The patient presents to the office for evaluation s/p right redo TKR with a history of DVT.  Knee replacement was 03/10/2021.  IVC filter was placed 03/04/2021.    The patient notes the leg continues to be painful with dependency and swells some.  Symptoms are much better with elevation.  The patient notes minimal edema in the morning which steadily worsens throughout the day.    The patient has not been using compression therapy at this point.  No SOB or pleuritic chest pains.  No cough or hemoptysis.  No blood per rectum or blood in any sputum.  No excessive bruising per the patient.    No outpatient medications have been marked as taking for the 05/19/21 encounter (Appointment) with Gilda Crease, Latina Craver, MD.    Past Medical History:  Diagnosis Date   (HFpEF) heart failure with preserved ejection fraction (HCC) 02/27/2014   a.) TTE 02/27/14: EF >55%; mod LVH; mild panvalvular regurgitation; (+) global stress induced HK. b.) TTE 09/20/14: EF 55%; triv AR/MR, mild TR/PR; mild AS; BAE; c.) TTE 06/20/15: EF 55%, triv AR/MR; BAE; mild AS. d.) TTE 02/11/17: EF 55; significant  RV dilation with mod systolic dysfunction; mild AS. e.) TTE 02/27/19: EF 55%; mild AS. f.) TTE 06/04/20: EF 50%; mild AS   Anxiety    Aortic stenosis 09/20/2014   a.) TTE 09/20/2014: mild; MPG?. b.) TTE 06/20/2015: mild; MPG 8.0 mmHg. c.) TTE 02/11/2017: mild; MPG 12.4 mmHg. d.) TTE 02/27/2019: mild; MPG 11.4 mmHg. e.) TTE 06/04/2020: mild; MPG 9.6 mmHg.   Arthritis    Asthma    CAD (coronary artery disease)    Cataracts, bilateral    Chronic back pain    Chronic venous insufficiency    Complication of anesthesia    a.) postoperative amnesia (1999). b.) seizures after partial knee in 2014; sent to [facility] x 5 days d/t uncontrollable shaking and AMS; ?? fentanyl  induced tremors. c.) anesthesia awareness   COVID-19 05/2018   CVA (cerebral vascular accident) [facility])    Depressed    Essential tremor    GERD (gastroesophageal reflux disease)    Hepatitis B    a.) h/o in the 90s; reports not active at present   History of bronchitis    Jan-Mar 2017   History of hiatal hernia    Hyperlipidemia    Hypertension    Insomnia    Joint pain    Lymphedema    Mild cognitive impairment    Muscle spasm    in neck.Takes Flexeril as needed   Numbness    right knee,right hand,and toes on left   RBBB    Seizures (HCC)    [place]. takes Lamotrigine daily. Last seizure 6+ yrs ago   Sleep apnea with use of continuous positive airway pressure (CPAP)    VTE (venous thromboembolism)    a.) s/p RIGHT TKA    Past Surgical History:  Procedure Laterality Date   ANKLE ARTHROSCOPY Left 03/07/2020   Procedure: ANKLE ARTHROSCOPY;  Surgeon: Rosetta Posner, DPM;  Location: [facility];  Service: Podiatry;  Laterality: Left;   BACK SURGERY     x3   BLEPHAROPLASTY     CARDIAC CATHETERIZATION     2015   CARPAL TUNNEL RELEASE     COLONOSCOPY     CORONARY  ANGIOPLASTY WITH STENT PLACEMENT     x 3   CORONARY ANGIOPLASTY WITH STENT PLACEMENT Left 01/30/2003   Procedure: CORONARY ANGIOPLASTY WITH STENT PLACEMENT (2.75 x 32 mm Taxus stent to RCA); Location: [facility]; Surgeon: Krystal Clark, MD   dental implants     ESOPHAGOGASTRODUODENOSCOPY     EYE SURGERY     both eyes in 60's d/t muscle causing to squint   IVC FILTER INSERTION N/A 03/04/2021   Procedure: IVC FILTER INSERTION;  Surgeon: Renford Dills, MD;  Location: [facility];  Service: Cardiovascular;  Laterality: N/A;   JOINT REPLACEMENT Right    knee   KNEE ARTHROSCOPY     LEFT HEART CATH AND CORONARY ANGIOGRAPHY Left 04/12/2019   Procedure: LEFT HEART CATH AND CORONARY ANGIOGRAPHY;  Surgeon: Lamar Blinks, MD;  Location: [facility];  Service: Cardiovascular;  Laterality: Left;    MEDIAL PARTIAL KNEE REPLACEMENT Right    MEDIASTERNOTOMY N/A 10/28/2015   Procedure: PARTIAL STERNOTOMY;  Surgeon: Delight Ovens, MD;  Location: [facility] OR;  Service: Thoracic;  Laterality: N/A;   numerous hand surgery     ORIF ANKLE FRACTURE Left 03/07/2020   Procedure: OPEN REDUCTION INTERNAL FIXATION (ORIF) ANKLE FRACTURE;  Surgeon: Rosetta Posner, DPM;  Location: [facility];  Service: Podiatry;  Laterality: Left;   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   Procedure: RESECTION OF anterier MEDIASTINAL TUMOR;  Surgeon: Delight Ovens, MD;  Location: [facility] OR;  Service: Thoracic;  Laterality: N/A;   right hand fusion     rotorblator  03/1997   anteriograde amnesia resulted   TENDON REPAIR Left 03/07/2020   Procedure: FLEXOR TENDON REPAIR;  Surgeon: Rosetta Posner, DPM;  Location: [facility];  Service: Podiatry;  Laterality: Left;  sleep apnea   TONSILLECTOMY     TOTAL KNEE REVISION Right 03/10/2021   Procedure: TOTAL KNEE REVISION;  Surgeon: Donato Heinz, MD;  Location: [facility] ORS;  Service: Orthopedics;  Laterality: Right;   UMBILICAL HERNIA REPAIR      Social History Social History   Tobacco Use   Smoking status: Never   Smokeless tobacco: Never  Vaping Use   Vaping Use: Never used  Substance Use Topics   Alcohol use: Yes    Alcohol/week: 14.0 standard drinks    Types: 14 Shots of liquor per week    Comment: wine and couple shots of liquor each evening   Drug use: No    Family History Family History  Problem Relation Age of Onset   Cancer Father 74       Bone    COPD Father    Heart disease Mother    Hyperlipidemia Mother    Stroke Mother    Heart disease Maternal Grandfather    Hyperlipidemia Maternal Grandfather     Allergies  Allergen Reactions   Tramadol Other (See Comments)    CONTRAINDICATED PATIENT HAS HISTORY OF SEIZURE WILL LOWER SEIZURE THRESHOLD!!!!   Oxycodone     Other reaction(s): Hallucination   Fentanyl Other (See  Comments)    Possible involuntary tremors   Methocarbamol Other (See Comments)    confusion     REVIEW OF SYSTEMS (Negative unless checked)  Constitutional: [] Weight loss  [] Fever  [] Chills Cardiac: [] Chest pain   [] Chest pressure   [] Palpitations   [] Shortness of breath when laying flat   [] Shortness of breath with exertion. Vascular:  [] Pain in legs with walking   [] Pain in legs at rest  [x] History of  DVT   [] Phlebitis   [x] Swelling in legs   [] Varicose veins   [] Non-healing ulcers Pulmonary:   [] Uses home oxygen   [] Productive cough   [] Hemoptysis   [] Wheeze  [] COPD   [] Asthma Neurologic:  [] Dizziness   [] Seizures   [] History of stroke   [] History of TIA  [] Aphasia   [] Vissual changes   [] Weakness or numbness in arm   [] Weakness or numbness in leg Musculoskeletal:   [] Joint swelling   [x] Joint pain   [] Low back pain Hematologic:  [] Easy bruising  [] Easy bleeding   [] Hypercoagulable state   [] Anemic Gastrointestinal:  [] Diarrhea   [] Vomiting  [x] Gastroesophageal reflux/heartburn   [] Difficulty swallowing. Genitourinary:  [] Chronic kidney disease   [] Difficult urination  [] Frequent urination   [] Blood in urine Skin:  [] Rashes   [] Ulcers  Psychological:  [] History of anxiety   []  History of major depression.  Physical Examination  There were no vitals filed for this visit. There is no height or weight on file to calculate BMI. Gen: WD/WN, NAD Head: [place]/AT, No temporalis wasting.  Ear/Nose/Throat: Hearing grossly intact, nares w/o erythema or drainage, pinna without lesions Eyes: PER, EOMI, sclera nonicteric.  Neck: Supple, no gross masses.  No JVD.  Pulmonary:  Good air movement, no audible wheezing, no use of accessory muscles.  Cardiac: RRR, precordium not hyperdynamic. Vascular:  scattered varicosities present bilaterally.  Mild venous stasis changes to the legs bilaterally.  2+ soft pitting edema  Vessel Right Left  Radial Palpable Palpable  Gastrointestinal: soft,  non-distended. No guarding/no peritoneal signs.  Musculoskeletal: M/S 5/5 throughout.  No deformity.  Neurologic: CN 2-12 intact. Pain and light touch intact in extremities.  Symmetrical.  Speech is fluent. Motor exam as listed above. Psychiatric: Judgment intact, Mood & affect appropriate for pt's clinical situation. Dermatologic: Venous rashes no ulcers noted.  No changes consistent with cellulitis. Lymph : No lichenification or skin changes of chronic lymphedema.  CBC Lab Results  Component Value Date   WBC 5.5 04/06/2021   HGB 9.7 (L) 04/06/2021   HCT 27.3 (L) 04/06/2021   MCV 93.5 04/06/2021   PLT 145 (L) 04/06/2021    BMET    Component Value Date/Time   NA 125 (L) 04/06/2021 0512   NA 140 03/02/2014 0420   K 4.4 04/06/2021 0512   K 4.1 03/02/2014 0420   CL 97 (L) 04/06/2021 0512   CL 105 03/02/2014 0420   CO2 22 04/06/2021 0512   CO2 30 03/02/2014 0420   GLUCOSE 120 (H) 04/06/2021 0512   GLUCOSE 105 (H) 03/02/2014 0420   BUN 8 04/06/2021 0512   BUN 11 03/02/2014 0420   CREATININE 0.92 04/06/2021 0512   CREATININE 0.92 03/02/2014 0420   CALCIUM 8.9 04/06/2021 0512   CALCIUM 7.9 (L) 03/02/2014 0420   GFRNONAA >60 04/06/2021 0512   GFRNONAA >60 03/02/2014 0420   GFRNONAA >60 04/10/2013 1711   GFRAA >60 10/30/2015 1703   GFRAA >60 03/02/2014 0420   GFRAA >60 04/10/2013 1711   CrCl cannot be calculated (Patient's most recent lab result is older than the maximum 21 days allowed.).  COAG Lab Results  Component Value Date   INR 1.0 02/26/2021   INR 0.99 10/23/2015   INR 0.9 03/22/2013    Radiology No results found.   Assessment/Plan 1. Personal history of DVT (deep vein thrombosis) The patient is s/p TJR and is doing well.  IVC filter should be removed.  Risk and benefits were reviewed the patient.  Indications for the  procedure were reviewed.  All questions were answered, the patient agrees to proceed.   I have reviewed my discussion with the patient  regarding DVT and post phlebitic changes such as swelling and why it  causes symptoms such as pain.  The patient will continue to wear graduated compression stockings class on a daily basis a prescription was given. In addition, behavioral modification including elevation during the day and avoidance of prolonged dependency will be initiated.    The patient will follow-up with me in 3 months after the joint replacement surgery to discuss removal (this was also discussed today and the patient agrees with the plan to have the filter removed).    - VAS [place] LOWER EXTREMITY VENOUS (DVT)  2. S/P insertion of IVC (inferior vena caval) filter See Number 1 - VAS [place] LOWER EXTREMITY VENOUS (DVT)  3. Chronic venous insufficiency No surgery or intervention at this point in time.    I have had a long discussion with the patient regarding venous insufficiency and why it  causes symptoms. I have discussed with the patient the chronic skin changes that accompany venous insufficiency and the long term sequela such as infection and ulceration.  Patient will begin wearing graduated compression stockings class 1 (20-30 mmHg) or compression wraps on a daily basis a prescription was given. The patient will put the stockings on first thing in the morning and removing them in the evening. The patient is instructed specifically not to sleep in the stockings.    In addition, behavioral modification including several periods of elevation of the lower extremities during the day will be continued. I have demonstrated that proper elevation is a position with the ankles at heart level.  The patient is instructed to begin routine exercise, especially walking on a daily basis  4. History of revision of total knee arthroplasty He is s/p post successful revision.  Plan per Ortho  5. Essential hypertension Continue antihypertensive medications as already ordered, these medications have been reviewed and there are no changes at  this time.   6. Coronary artery disease involving native heart, unspecified vessel or lesion type, unspecified whether angina present Continue cardiac and antihypertensive medications as already ordered and reviewed, no changes at this time.  Continue statin as ordered and reviewed, no changes at this time  Nitrates PRN for chest pain   7. Gastroesophageal reflux disease without esophagitis Continue PPI as already ordered, this medication has been reviewed and there are no changes at this time.  Avoidence of caffeine and alcohol  Moderate elevation of the head of the bed      Levora Dredge, MD  05/19/2021 4:00 PM

## 2021-05-27 ENCOUNTER — Encounter
Admission: RE | Disposition: A | Discharge status: Home / Self Care | Payer: Self-pay | Source: Home / Self Care | Attending: Vascular Surgery

## 2021-05-27 ENCOUNTER — Ambulatory Visit
Admission: RE | Admit: 2021-05-27 | Discharge: 2021-05-27 | Disposition: A | Discharge status: Home / Self Care | Payer: Medicare Other | Attending: Vascular Surgery | Admitting: Vascular Surgery

## 2021-05-27 ENCOUNTER — Other Ambulatory Visit (INDEPENDENT_AMBULATORY_CARE_PROVIDER_SITE_OTHER): Payer: Self-pay | Admitting: Nurse Practitioner

## 2021-05-27 DIAGNOSIS — Z4589 Encounter for adjustment and management of other implanted devices: Secondary | ICD-10-CM | POA: Diagnosis not present

## 2021-05-27 DIAGNOSIS — I872 Venous insufficiency (chronic) (peripheral): Secondary | ICD-10-CM | POA: Diagnosis not present

## 2021-05-27 DIAGNOSIS — I5032 Chronic diastolic (congestive) heart failure: Secondary | ICD-10-CM | POA: Insufficient documentation

## 2021-05-27 DIAGNOSIS — I2510 Atherosclerotic heart disease of native coronary artery without angina pectoris: Secondary | ICD-10-CM | POA: Diagnosis not present

## 2021-05-27 DIAGNOSIS — Z96651 Presence of right artificial knee joint: Secondary | ICD-10-CM | POA: Insufficient documentation

## 2021-05-27 DIAGNOSIS — I110 Hypertensive heart disease with heart failure: Secondary | ICD-10-CM | POA: Insufficient documentation

## 2021-05-27 DIAGNOSIS — I82409 Acute embolism and thrombosis of unspecified deep veins of unspecified lower extremity: Secondary | ICD-10-CM

## 2021-05-27 DIAGNOSIS — Z86718 Personal history of other venous thrombosis and embolism: Secondary | ICD-10-CM | POA: Diagnosis not present

## 2021-05-27 DIAGNOSIS — K219 Gastro-esophageal reflux disease without esophagitis: Secondary | ICD-10-CM | POA: Diagnosis not present

## 2021-05-27 HISTORY — PX: IVC FILTER REMOVAL: CATH118246

## 2021-05-27 SURGERY — IVC FILTER REMOVAL
Anesthesia: Moderate Sedation

## 2021-05-27 MED ORDER — MIDAZOLAM HCL 2 MG/2ML IJ SOLN
INTRAMUSCULAR | Status: DC | PRN
  Administered 2021-05-27: 1 mg via INTRAVENOUS
  Administered 2021-05-27: 2 mg via INTRAVENOUS

## 2021-05-27 MED ORDER — ONDANSETRON HCL 4 MG/2ML IJ SOLN: 4.0000 mg | Freq: Four times a day (QID) | INTRAMUSCULAR | Status: DC | PRN

## 2021-05-27 MED ORDER — IODIXANOL 320 MG/ML IV SOLN
INTRAVENOUS | Status: DC | PRN
  Administered 2021-05-27: 15 mL via INTRAVENOUS

## 2021-05-27 MED ORDER — MIDAZOLAM HCL 2 MG/ML PO SYRP: 8.0000 mg | ORAL_SOLUTION | Freq: Once | ORAL | Status: DC | PRN

## 2021-05-27 MED ORDER — CEFAZOLIN SODIUM-DEXTROSE 2-4 GM/100ML-% IV SOLN: 2.0000 g | Freq: Once | INTRAVENOUS | Status: AC

## 2021-05-27 MED ORDER — DIPHENHYDRAMINE HCL 50 MG/ML IJ SOLN: 50.0000 mg | Freq: Once | INTRAMUSCULAR | Status: DC | PRN

## 2021-05-27 MED ORDER — CEFAZOLIN SODIUM-DEXTROSE 2-4 GM/100ML-% IV SOLN
INTRAVENOUS | Status: AC
  Administered 2021-05-27: 2 g via INTRAVENOUS
  Filled 2021-05-27: qty 100

## 2021-05-27 MED ORDER — HYDROMORPHONE HCL 1 MG/ML IJ SOLN: 1.0000 mg | Freq: Once | INTRAMUSCULAR | Status: DC | PRN

## 2021-05-27 MED ORDER — MIDAZOLAM HCL 2 MG/2ML IJ SOLN
INTRAMUSCULAR | Status: AC
  Filled 2021-05-27: qty 2

## 2021-05-27 MED ORDER — SODIUM CHLORIDE 0.9 % IV SOLN: INTRAVENOUS | Status: DC

## 2021-05-27 MED ORDER — FENTANYL CITRATE (PF) 100 MCG/2ML IJ SOLN
INTRAMUSCULAR | Status: DC | PRN
  Administered 2021-05-27: 25 ug via INTRAVENOUS
  Administered 2021-05-27: 50 ug via INTRAVENOUS

## 2021-05-27 MED ORDER — METHYLPREDNISOLONE SODIUM SUCC 125 MG IJ SOLR: 125.0000 mg | Freq: Once | INTRAMUSCULAR | Status: DC | PRN

## 2021-05-27 MED ORDER — FAMOTIDINE 20 MG PO TABS: 40.0000 mg | ORAL_TABLET | Freq: Once | ORAL | Status: DC | PRN

## 2021-05-27 MED ORDER — FENTANYL CITRATE (PF) 100 MCG/2ML IJ SOLN
INTRAMUSCULAR | Status: AC
  Filled 2021-05-27: qty 2

## 2021-05-27 SURGICAL SUPPLY — 4 items
COVER PROBE U/S 5X48 (MISCELLANEOUS) ×1 IMPLANT
KIT SNARE RETRIEVAL (KITS) ×1 IMPLANT
PACK ANGIOGRAPHY (CUSTOM PROCEDURE TRAY) ×2 IMPLANT
WIRE GUIDERIGHT .035X150 (WIRE) ×1 IMPLANT

## 2021-05-27 NOTE — H&P (Signed)
[facility] ?History & Physical Update ? ?See clinic note from 3/20. The patient was interviewed and re-examined.  The patient's previous History and Physical has been reviewed and is unchanged.  There is no change in the plan of care. We plan to proceed with the scheduled procedure. ? ?Leotis Pain, MD ? ?05/27/2021, 11:34 AM ?  ? ?

## 2021-05-27 NOTE — Op Note (Signed)
[facility] ? ? ?OPERATIVE NOTE ? ? ? ?PRE-OPERATIVE DIAGNOSIS:  ?1. DVT ?2. status post IVC filter placement ? ?POST-OPERATIVE DIAGNOSIS: Same as above ? ?PROCEDURE: ?1.   Ultrasound guidance for vascular access right jugular vein ?2.   Catheter placement into inferior vena cava from right jugular vein ?3.   Inferior venacavogram ?4.   Retrieval of [facility] IVC filter ? ?SURGEON: Leotis Pain, MD ? ?ASSISTANT(S): None ? ?ANESTHESIA: Local with moderate conscious sedation for approximately 16 minutes using 3 mg of Versed and 75 mcg of Fentanyl ? ?ESTIMATED BLOOD LOSS: 5 cc ? ?CONTRAST:  15 cc ? ?FLUORO TIME:  4.1 minutes ? ?FINDING(S): ?1.  patent IVC ? ?SPECIMEN(S):  IVC filter ? ?INDICATIONS:    ?Patient is a 76 y.o. male who presents with a previous history of IVC filter placement. Patient has tolerated anticoagulation and no longer needs this filter. The patient remains on anticoagulation. Risks and benefits were discussed, and informed consent was obtained. ? ?DESCRIPTION: ?After obtaining full informed written consent, the patient was brought back to the vascular suite and placed supine upon the table. Moderate conscious sedation was administered during a face to face encounter with the patient throughout the procedure with my supervision of the RN administering medicines and monitoring the patient's vital signs, pulse oximetry, telemetry and mental status throughout from the start of the procedure until the patient was taken to the recovery room.  After obtaining adequate anesthesia, the patient was prepped and draped in the standard fashion.  The right jugular vein was visualized with ultrasound and found to be widely patent. It was then accessed under direct ultrasound guidance without difficulty with the Seldinger needle and a permanent image was recorded. A J-wire was placed. After skin nick and dilatation, the retrieval sheath was placed over the wire and advanced into the  inferior vena cava. Inferior vena cava was imaged and found to be widely patent on inferior venacavogram. The filter was straight in its orientation. The retrieval snare was then placed through the sheath and the hook of the filter was snared without difficulty. The sheath was then advanced, and the filter was collapsed and brought into the sheath in its entirety. It was then removed from the body in its entirety. The retrieval sheath was then removed. Pressure was held at the access site and sterile dressing was placed. The patient was taken to the recovery room in stable condition having tolerated the procedure well. ? ?COMPLICATIONS: None ? ?CONDITION:  Stable ? ? ?Leotis Pain ?05/27/2021 ?12:55 PM ? ?This note was created with Dragon Medical transcription system. Any errors in dictation are purely unintentional.  ?

## 2021-05-28 ENCOUNTER — Encounter: Payer: Self-pay | Admitting: Vascular Surgery

## 2021-05-29 ENCOUNTER — Other Ambulatory Visit: Payer: Self-pay

## 2021-05-29 ENCOUNTER — Ambulatory Visit
Admission: RE | Admit: 2021-05-29 | Discharge: 2021-05-29 | Disposition: A | Discharge status: Home / Self Care | Payer: Medicare Other | Source: Ambulatory Visit | Attending: Physician Assistant | Admitting: Physician Assistant

## 2021-05-29 DIAGNOSIS — G8929 Other chronic pain: Secondary | ICD-10-CM | POA: Insufficient documentation

## 2021-05-29 DIAGNOSIS — R918 Other nonspecific abnormal finding of lung field: Secondary | ICD-10-CM

## 2021-05-29 DIAGNOSIS — Z981 Arthrodesis status: Secondary | ICD-10-CM | POA: Diagnosis not present

## 2021-05-29 DIAGNOSIS — M545 Low back pain, unspecified: Secondary | ICD-10-CM

## 2021-06-02 ENCOUNTER — Encounter: Payer: Self-pay | Admitting: Vascular Surgery

## 2021-06-05 NOTE — Interval H&P Note (Signed)
History and Physical Interval Note: ? ?06/05/2021 ?12:35 PM ? ?Johnny Mccoy  has presented today for surgery, with the diagnosis of IVC Filter Removal   DVT.  The various methods of treatment have been discussed with the patient and family. After consideration of risks, benefits and other options for treatment, the patient has consented to  Procedure(s): ?IVC FILTER REMOVAL (N/A) as a surgical intervention.  The patient's history has been reviewed, patient examined, no change in status, stable for surgery.  I have reviewed the patient's chart and labs.  Questions were answered to the patient's satisfaction.   ? ? ?Leotis Pain ? ? ?

## 2021-06-25 DIAGNOSIS — Z86718 Personal history of other venous thrombosis and embolism: Secondary | ICD-10-CM | POA: Insufficient documentation

## 2021-06-25 NOTE — Progress Notes (Signed)
? ? ? ? ?MRN : [id number] ? ?Johnny Mccoy is a 76 y.o. (Oct 03, 1945) male who presents with chief complaint of legs hurt and swell. ? ?History of Present Illness:  ? ?The patient presents to the office for follow up s/p IVC filter removal 05/27/2021. ? ?The patient is s/p right redo TKR with a history of DVT.  Knee replacement was 03/10/2021.  IVC filter was placed 03/04/2021 prior to joint surgery.   ?  ?The patient notes the leg swells some but seems to be well controlled.  He is not having a significant amount of pain associated with his swelling.  Symptoms are better with elevation.  The patient notes minimal edema in the morning which steadily worsens throughout the day.   ?  ?The patient has been using compression therapy at this point and he has been using his lymphedema pump on a regular basis. ?  ?No SOB or pleuritic chest pains.  No cough or hemoptysis. ?  ?Of note post knee replacement the patient had a significant problem with hyponatremia and ended up being admitted to the hospital 3 times.  He has had a large number of his medications changed he was quite confused during some of these admissions and this has been a significant issue for him.  At this point thankfully he does feel like he is getting better but is not yet back to his baseline. ? ? No recent shortening of the patient's walking distance or new symptoms consistent with claudication.  No history of rest pain symptoms. No new ulcers or wounds of the lower extremities have occurred. ? ?The patient denies amaurosis fugax or recent TIA symptoms. There are no recent neurological changes noted. ?No recent episodes of angina or shortness of breath documented.  ?  ? ?No outpatient medications have been marked as taking for the 06/26/21 encounter (Appointment) with Delana Meyer, Dolores Lory, MD.  ? ? ?Past Medical History:  ?Diagnosis Date  ? (HFpEF) heart failure with preserved ejection fraction ([place]) 02/27/2014  ? a.) TTE 02/27/14: EF >55%; mod LVH; mild  panvalvular regurgitation; (+) global stress induced HK. b.) TTE 09/20/14: EF 55%; triv AR/MR, mild TR/PR; mild AS; BAE; c.) TTE 06/20/15: EF 55%, triv AR/MR; BAE; mild AS. d.) TTE 02/11/17: EF 55; significant  RV dilation with mod systolic dysfunction; mild AS. e.) TTE 02/27/19: EF 55%; mild AS. f.) TTE 06/04/20: EF 50%; mild AS  ? Anxiety   ? Aortic stenosis 09/20/2014  ? a.) TTE 09/20/2014: mild; MPG?. b.) TTE 06/20/2015: mild; MPG 8.0 mmHg. c.) TTE 02/11/2017: mild; MPG 12.4 mmHg. d.) TTE 02/27/2019: mild; MPG 11.4 mmHg. e.) TTE 06/04/2020: mild; MPG 9.6 mmHg.  ? Arthritis   ? Asthma   ? CAD (coronary artery disease)   ? Cataracts, bilateral   ? Chronic back pain   ? Chronic venous insufficiency   ? Complication of anesthesia   ? a.) postoperative amnesia (1999). b.) seizures after partial knee in 2014; sent to [facility] x 5 days d/t uncontrollable shaking and AMS; ?? fentanyl induced tremors. c.) anesthesia awareness  ? COVID-19 05/2018  ? CVA (cerebral vascular accident) [facility])   ? Depressed   ? Essential tremor   ? GERD (gastroesophageal reflux disease)   ? Hepatitis B   ? a.) h/o in the 90s; reports not active at present  ? History of bronchitis   ? Jan-Mar 2017  ? History of hiatal hernia   ? Hyperlipidemia   ? Hypertension   ? Insomnia   ?  Joint pain   ? Lymphedema   ? Mild cognitive impairment   ? Muscle spasm   ? in neck.Takes Flexeril as needed  ? Numbness   ? right knee,right hand,and toes on left  ? RBBB   ? Seizures ([place])   ? [place]. takes Lamotrigine daily. Last seizure 6+ yrs ago  ? Sleep apnea with use of continuous positive airway pressure (CPAP)   ? VTE (venous thromboembolism)   ? a.) s/p RIGHT TKA  ? ? ?Past Surgical History:  ?Procedure Laterality Date  ? ANKLE ARTHROSCOPY Left 03/07/2020  ? Procedure: ANKLE ARTHROSCOPY;  Surgeon: Caroline More, DPM;  Location: [place];  Service: Podiatry;  Laterality: Left;  ? BACK SURGERY    ? x3  ? BLEPHAROPLASTY    ? CARDIAC CATHETERIZATION    ?  2015  ? CARPAL TUNNEL RELEASE    ? COLONOSCOPY    ? CORONARY ANGIOPLASTY WITH STENT PLACEMENT    ? x 3  ? CORONARY ANGIOPLASTY WITH STENT PLACEMENT Left 01/30/2003  ? Procedure: CORONARY ANGIOPLASTY WITH STENT PLACEMENT (2.75 x 32 mm Taxus stent to RCA); Location: [facility]; Surgeon: Courtney Paris, MD  ? dental implants    ? ESOPHAGOGASTRODUODENOSCOPY    ? EYE SURGERY    ? both eyes in 60's d/t muscle causing to squint  ? IVC FILTER INSERTION N/A 03/04/2021  ? Procedure: IVC FILTER INSERTION;  Surgeon: Katha Cabal, MD;  Location: [place] [facility];  Service: Cardiovascular;  Laterality: N/A;  ? IVC FILTER REMOVAL N/A 05/27/2021  ? Procedure: IVC FILTER REMOVAL;  Surgeon: Algernon Huxley, MD;  Location: [place] [facility];  Service: Cardiovascular;  Laterality: N/A;  ? JOINT REPLACEMENT Right   ? knee  ? KNEE ARTHROSCOPY    ? LEFT HEART CATH AND CORONARY ANGIOGRAPHY Left 04/12/2019  ? Procedure: LEFT HEART CATH AND CORONARY ANGIOGRAPHY;  Surgeon: Corey Skains, MD;  Location: [place] [facility];  Service: Cardiovascular;  Laterality: Left;  ? MEDIAL PARTIAL KNEE REPLACEMENT Right   ? MEDIASTERNOTOMY N/A 10/28/2015  ? Procedure: PARTIAL STERNOTOMY;  Surgeon: [name], MD;  Location: [place];  Service: Thoracic;  Laterality: N/A;  ? numerous hand surgery    ? ORIF ANKLE FRACTURE Left 03/07/2020  ? Procedure: OPEN REDUCTION INTERNAL FIXATION (ORIF) ANKLE FRACTURE;  Surgeon: Caroline More, DPM;  Location: [place];  Service: Podiatry;  Laterality: Left;  ? 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  ? Procedure: RESECTION OF anterier MEDIASTINAL TUMOR;  Surgeon: [name], MD;  Location: [place];  Service: Thoracic;  Laterality: N/A;  ? right hand fusion    ? rotorblator  03/1997  ? anteriograde amnesia resulted  ? TENDON REPAIR Left 03/07/2020  ? Procedure: FLEXOR TENDON REPAIR;  Surgeon: Caroline More, DPM;  Location: [place];  Service: Podiatry;  Laterality: Left;  sleep  apnea  ? TONSILLECTOMY    ? TOTAL KNEE REVISION Right 03/10/2021  ? Procedure: TOTAL KNEE REVISION;  Surgeon: Dereck Leep, MD;  Location: [facility];  Service: Orthopedics;  Laterality: Right;  ? UMBILICAL HERNIA REPAIR    ? ? ?Social History ?Social History  ? ?Tobacco Use  ? Smoking status: Never  ? Smokeless tobacco: Never  ?Vaping Use  ? Vaping Use: Never used  ?Substance Use Topics  ? Alcohol use: Yes  ?  Alcohol/week: 14.0 standard drinks  ?  Types: 14 Shots of liquor per week  ?  Comment: wine and  couple shots of liquor each evening  ? Drug use: No  ? ? ?Family History ?Family History  ?Problem Relation Age of Onset  ? Cancer Father 37  ?     Bone   ? COPD Father   ? Heart disease Mother   ? Hyperlipidemia Mother   ? Stroke Mother   ? Heart disease Maternal Grandfather   ? Hyperlipidemia Maternal Grandfather   ? ? ?Allergies  ?Allergen Reactions  ? Fentanyl Other (See Comments)  ?  Possible involuntary tremors ?Other reaction(s): Other (See Comments) ?Involuntary tremors. Patient said this was looked into and it was not the cause of the tremors  ? Methocarbamol Other (See Comments)  ?  confusion  ? ? ? ?REVIEW OF SYSTEMS (Negative unless checked) ? ?Constitutional: '[]'$ Weight loss  '[]'$ Fever  '[]'$ Chills ?Cardiac: '[]'$ Chest pain   '[]'$ Chest pressure   '[]'$ Palpitations   '[]'$ Shortness of breath when laying flat   '[]'$ Shortness of breath with exertion. ?Vascular:  '[]'$ Pain in legs with walking   '[x]'$ Pain in legs at rest  '[]'$ History of DVT   '[]'$ Phlebitis   '[x]'$ Swelling in legs   '[]'$ Varicose veins   '[]'$ Non-healing ulcers ?Pulmonary:   '[]'$ Uses home oxygen   '[]'$ Productive cough   '[]'$ Hemoptysis   '[]'$ Wheeze  '[]'$ COPD   '[]'$ Asthma ?Neurologic:  '[]'$ Dizziness   '[]'$ Seizures   '[]'$ History of stroke   '[]'$ History of TIA  '[]'$ Aphasia   '[]'$ Vissual changes   '[]'$ Weakness or numbness in arm   '[]'$ Weakness or numbness in leg ?Musculoskeletal:   '[]'$ Joint swelling   '[]'$ Joint pain   '[]'$ Low back pain ?Hematologic:  '[]'$ Easy bruising  '[]'$ Easy bleeding   '[]'$ Hypercoagulable state    '[]'$ Anemic ?Gastrointestinal:  '[]'$ Diarrhea   '[]'$ Vomiting  '[]'$ Gastroesophageal reflux/heartburn   '[]'$ Difficulty swallowing. ?Genitourinary:  '[]'$ Chronic kidney disease   '[]'$ Difficult urination  '[]'$ Frequent urination   '[]'$ B

## 2021-06-26 ENCOUNTER — Ambulatory Visit (INDEPENDENT_AMBULATORY_CARE_PROVIDER_SITE_OTHER): Payer: Medicare Other | Admitting: Vascular Surgery

## 2021-06-26 ENCOUNTER — Encounter (INDEPENDENT_AMBULATORY_CARE_PROVIDER_SITE_OTHER): Payer: Self-pay | Admitting: Vascular Surgery

## 2021-06-26 VITALS — BP 149/86 | HR 64 | Resp 18 | Ht 67.0 in | Wt 214.0 lb

## 2021-06-26 DIAGNOSIS — I10 Essential (primary) hypertension: Secondary | ICD-10-CM

## 2021-06-26 DIAGNOSIS — Z9889 Other specified postprocedural states: Secondary | ICD-10-CM | POA: Diagnosis not present

## 2021-06-26 DIAGNOSIS — I2510 Atherosclerotic heart disease of native coronary artery without angina pectoris: Secondary | ICD-10-CM

## 2021-06-26 DIAGNOSIS — Z86718 Personal history of other venous thrombosis and embolism: Secondary | ICD-10-CM

## 2021-06-26 DIAGNOSIS — J4540 Moderate persistent asthma, uncomplicated: Secondary | ICD-10-CM | POA: Diagnosis not present

## 2021-06-26 DIAGNOSIS — I890 Lymphedema, not elsewhere classified: Secondary | ICD-10-CM

## 2021-10-01 ENCOUNTER — Other Ambulatory Visit: Payer: Self-pay | Admitting: Psychology

## 2021-10-07 ENCOUNTER — Encounter (INDEPENDENT_AMBULATORY_CARE_PROVIDER_SITE_OTHER): Payer: Self-pay | Admitting: Vascular Surgery

## 2021-10-28 ENCOUNTER — Encounter: Payer: Self-pay | Admitting: Physical Therapy

## 2021-10-28 ENCOUNTER — Ambulatory Visit: Payer: Medicare Other | Attending: Nurse Practitioner | Admitting: Physical Therapy

## 2021-10-28 DIAGNOSIS — R42 Dizziness and giddiness: Secondary | ICD-10-CM | POA: Diagnosis present

## 2021-10-28 DIAGNOSIS — R2681 Unsteadiness on feet: Secondary | ICD-10-CM | POA: Diagnosis present

## 2021-10-28 NOTE — Therapy (Unsigned)
OUTPATIENT PHYSICAL THERAPY VESTIBULAR EVALUATION     Patient Name: Johnny Mccoy MRN: [id number] DOB:March 14, 1945, 76 y.o., male Today's Date: 10/28/2021  PCP: Idelle Crouch, MD REFERRING PROVIDER: Neita Garnet, NP   PT End of Session - 10/28/21 1311     Visit Number 1    Number of Visits 13    Date for PT Re-Evaluation 01/20/22    PT Start Time 0623    PT Stop Time 1155    PT Time Calculation (min) 68 min    Equipment Utilized During Treatment Gait belt    Activity Tolerance Patient tolerated treatment well    Behavior During Therapy WFL for tasks assessed/performed             Past Medical History:  Diagnosis Date   (HFpEF) heart failure with preserved ejection fraction ([place]) 02/27/2014   a.) TTE 02/27/14: EF >55%; mod LVH; mild panvalvular regurgitation; (+) global stress induced HK. b.) TTE 09/20/14: EF 55%; triv AR/MR, mild TR/PR; mild AS; BAE; c.) TTE 06/20/15: EF 55%, triv AR/MR; BAE; mild AS. d.) TTE 02/11/17: EF 55; significant  RV dilation with mod systolic dysfunction; mild AS. e.) TTE 02/27/19: EF 55%; mild AS. f.) TTE 06/04/20: EF 50%; mild AS   Anxiety    Aortic stenosis 09/20/2014   a.) TTE 09/20/2014: mild; MPG?. b.) TTE 06/20/2015: mild; MPG 8.0 mmHg. c.) TTE 02/11/2017: mild; MPG 12.4 mmHg. d.) TTE 02/27/2019: mild; MPG 11.4 mmHg. e.) TTE 06/04/2020: mild; MPG 9.6 mmHg.   Arthritis    Asthma    CAD (coronary artery disease)    Cataracts, bilateral    Chronic back pain    Chronic venous insufficiency    Complication of anesthesia    a.) postoperative amnesia (1999). b.) seizures after partial knee in 2014; sent to [facility] x 5 days d/t uncontrollable shaking and AMS; ?? fentanyl induced tremors. c.) anesthesia awareness   COVID-19 05/2018   CVA (cerebral vascular accident) [facility])    Depressed    Essential tremor    GERD (gastroesophageal reflux disease)    Hepatitis B    a.) h/o in the 90s; reports not active at present   History of bronchitis     Jan-Mar 2017   History of hiatal hernia    Hyperlipidemia    Hypertension    Insomnia    Joint pain    Lymphedema    Mild cognitive impairment    Muscle spasm    in neck.Takes Flexeril as needed   Numbness    right knee,right hand,and toes on left   RBBB    Seizures (HCC)    [place]. takes Lamotrigine daily. Last seizure 6+ yrs ago   Sleep apnea with use of continuous positive airway pressure (CPAP)    VTE (venous thromboembolism)    a.) s/p RIGHT TKA   Past Surgical History:  Procedure Laterality Date   ANKLE ARTHROSCOPY Left 03/07/2020   Procedure: ANKLE ARTHROSCOPY;  Surgeon: Caroline More, DPM;  Location: [place];  Service: Podiatry;  Laterality: Left;   BACK SURGERY     x3   BLEPHAROPLASTY     CARDIAC CATHETERIZATION     2015   CARPAL TUNNEL RELEASE     COLONOSCOPY     CORONARY ANGIOPLASTY WITH STENT PLACEMENT     x 3   CORONARY ANGIOPLASTY WITH STENT PLACEMENT Left 01/30/2003   Procedure: CORONARY ANGIOPLASTY WITH STENT PLACEMENT (2.75 x 32 mm Taxus stent to RCA); Location: [facility]; Surgeon: Joycelyn Schmid  Hassett, MD   dental implants     ESOPHAGOGASTRODUODENOSCOPY     EYE SURGERY     both eyes in 60's d/t muscle causing to squint   IVC FILTER INSERTION N/A 03/04/2021   Procedure: IVC FILTER INSERTION;  Surgeon: Katha Cabal, MD;  Location: [place] [facility];  Service: Cardiovascular;  Laterality: N/A;   IVC FILTER REMOVAL N/A 05/27/2021   Procedure: IVC FILTER REMOVAL;  Surgeon: Algernon Huxley, MD;  Location: [place] [facility];  Service: Cardiovascular;  Laterality: N/A;   JOINT REPLACEMENT Right    knee   KNEE ARTHROSCOPY     LEFT HEART CATH AND CORONARY ANGIOGRAPHY Left 04/12/2019   Procedure: LEFT HEART CATH AND CORONARY ANGIOGRAPHY;  Surgeon: Corey Skains, MD;  Location: [place] [facility];  Service: Cardiovascular;  Laterality: Left;   MEDIAL PARTIAL KNEE REPLACEMENT Right    MEDIASTERNOTOMY N/A 10/28/2015   Procedure: PARTIAL  STERNOTOMY;  Surgeon: [name], MD;  Location: [place];  Service: Thoracic;  Laterality: N/A;   numerous hand surgery     ORIF ANKLE FRACTURE Left 03/07/2020   Procedure: OPEN REDUCTION INTERNAL FIXATION (ORIF) ANKLE FRACTURE;  Surgeon: Caroline More, DPM;  Location: [place];  Service: Podiatry;  Laterality: Left;   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   Procedure: RESECTION OF anterier MEDIASTINAL TUMOR;  Surgeon: [name], MD;  Location: [place];  Service: Thoracic;  Laterality: N/A;   right hand fusion     rotorblator  03/1997   anteriograde amnesia resulted   TENDON REPAIR Left 03/07/2020   Procedure: FLEXOR TENDON REPAIR;  Surgeon: Caroline More, DPM;  Location: [place];  Service: Podiatry;  Laterality: Left;  sleep apnea   TONSILLECTOMY     TOTAL KNEE REVISION Right 03/10/2021   Procedure: TOTAL KNEE REVISION;  Surgeon: Dereck Leep, MD;  Location: [facility];  Service: Orthopedics;  Laterality: Right;   UMBILICAL HERNIA REPAIR     Patient Active Problem List   Diagnosis Date Noted   History of DVT of lower extremity 06/25/2021   Hypotension 62/56/3893   Acute metabolic encephalopathy    Right knee pain    Sepsis ([place]) 03/25/2021   History of revision of total knee arthroplasty 03/10/2021   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 01/15/2021   Loose right total knee arthroplasty ([place]) 01/12/2021   Impairment of balance 01/03/2021   Muscle rigidity 01/03/2021   Arthritis 07/01/2020   Cataracts, bilateral 07/01/2020   Pneumonia, aspiration ([place]) 07/01/2020   Shingles 07/01/2020   AKI (acute kidney injury) ([place]) 06/10/2020   Hyponatremia 06/10/2020   History of seizures 73/42/8768   Acute diastolic heart failure due to valvular disease ([place]) 06/06/2020   Pedal edema 06/06/2020   At risk for delirium 08/29/2019   Angina pectoris ([place]) 03/29/2019   Idiopathic peripheral neuropathy 11/02/2018   Labyrinthine disorder 10/21/2018    Diastasis of rectus abdominis 03/18/2018   Diplopia 07/21/2017   Myopia of both eyes with astigmatism 07/21/2017   Nuclear senile cataract, bilateral 07/21/2017   Mild aortic valve stenosis 06/29/2017   History of pulmonary embolism 11/30/2016   Myopia 09/25/2016   Divergence insufficiency 09/25/2016   Impacted cerumen 09/25/2016   Meibomian gland dysfunction 09/25/2016   Presbyopia 09/25/2016   Tear film insufficiency 09/25/2016   Mild cognitive impairment 09/11/2016   Obesity (BMI 30-39.9) 09/11/2016   Spondylolisthesis of lumbar region 09/11/2016   Thrombocytopenia ([place]) 09/11/2016   Other amnesia 09/09/2016  Seizure ([place]) 09/08/2016   Tremor 09/08/2016   Pain syndrome, chronic 08/10/2016   Acute respiratory failure with hypoxemia ([place]) 07/20/2016   Delirium 07/20/2016   Hypokalemia 07/20/2016   Asthma without status asthmaticus 07/20/2016   Left lower lobe pneumonia 07/20/2016   Spinal stenosis 07/20/2016   Lymphedema 12/16/2015   Chronic venous insufficiency 12/16/2015   Leg swelling 12/16/2015   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   Asthenia 12/10/2015   Carpal tunnel syndrome of left wrist 12/09/2015   Left hemiparesis ([place]) 11/05/2015   Skin sensation disturbance 11/05/2015   Mediastinal mass 10/28/2015   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   Anxiety 05/23/2015   Acid reflux 05/23/2015   HBV (hepatitis B virus) infection 05/23/2015   History of cardiac catheterization 05/23/2015   Hyperlipidemia 05/23/2015   CAD (coronary artery disease) 05/23/2015   Anxiety and depression 05/23/2015   Cough 07/26/2014   Essential hypertension 07/16/2014   Breathlessness on exertion 02/12/2014   Valvular heart disease 07/25/2013   History of CVA (cerebrovascular accident) 06/01/2013   Altered mental status 06/21/2012   Bundle branch block, right 05/29/2012   History of knee surgery 05/26/2012   Ophthalmoplegia 01/01/2010    ONSET DATE: 2020  REFERRING DIAG: H81.92 unspecified disorder of  vestibular function, left ear; Labyrinthine disorder of left ear; R42 dizziness and giddiness  Rationale for Evaluation and Treatment Rehabilitation  THERAPY DIAG:  Dizziness and giddiness  Unsteadiness on feet   SUBJECTIVE:   SUBJECTIVE STATEMENT: Pt reports that he was seen in 2020 for physical therapy due to dizziness symptoms following an episode of long COVID. Pt states it affected the nerve that ran from the ear and eye and they did many tests. Pt states his hearing test revealed that his hearing went down in his left ear. Pt wears hearing aide on left ear. Per medical record, pt had a VNG which reported 22% left vestibular weakness at that time. Pt states that he used to have a sensation of falling to the right side but now he is getting a sensation of falling forward. Pt states that if he is going to go down stairs he gets this sensation of falling forward and he has to hold on. Patient states Dr. Nyra Capes is his ENT at [facility]. Pt states he saw his neurologist about this sensation of falling and that they referred him for vestibular PT services. Pt states his doctor told him that he has neuropathy in his feet. Pt states when he is walking he gets "tottery" and veering at times. Patient states that he walks into door frames and gets bruises. Patient states he feels unsteady when walking in the grocery store and that he holds onto the cart or his wife for support. Patient states that he is driving but states that he prefers for his wife to drive him at present. Pt states that he loves to garden but that he modifies how he gardens and he is careful when he has to bend over or lean forward as this movement will bring on dizziness if he does too much. Patient states that he has prisms in his glasses and that he had not seen the eye doctor in 2 years. Pt states he started seeing double again at times.Pt states he recently went to see the eye doctor and that they are changing his prescription and he is  picking his new glasses up this week.  Pt states that he has laser surgery for his left eye scheduled for Oct 6 as he states the "back  of the lens fogged over". Pt states he is not falling to the right now like he did back in 2020 but this time it is a more tentative feeling. Pt states for example when he tries to sweep the patio floor, he feels extremely uncomfortable like he might fall of the patio which raised up by 4 steps, and that he is nervous of trying to go up/down the patio steps. Pt states that he fell last year on the patio and slammed his right knee down an states he need to have a TKR revision done in Jan 2023. Pt states he has had 3 bouts of critical sodium drops in Jan and two in Feb in 2023 and was hospitalized each time. Pt states he is not sure if that had any affect on things but he feels his personality changed after this experience and the feeling of uncertainty has lingered. Pt states he feels he is sleeping more now. Pt states he is being seen for PT for his shoulder currently.  Per medical record, PT eval 12/30/18: Pt reports that about 3 months ago, after having COVID-19, he began having white flashes in his eyes, experiencing intermittent dizziness, and headaches.  He reports when it first started, he would feel it when he was transitioning from sitting to standing and would begin veering to the L.  He presents with baseline diplopia and wears prism lens. VNG study was negative with the exception of 22% left vestibular weakness. Pt accompanied by: self  PERTINENT HISTORY:  SYMPTOM BEHAVIOR: Non-Vestibular symptoms: changes in hearing and changes in vision Type of dizziness: Imbalance (Disequilibrium), Unsteady with head/body turns, and by midday feels like everything is turning yellow like a fog and he lies down for an hour or so and that helps him feel better Frequency: daily  Duration: minutes   Aggravating factors: Induced by position change: bending over, Induced by  motion: occur when walking, turning body quickly, and turning head quickly, and Worse outside or in busy environment Relieving factors: lying supine Progression of symptoms: better than back in 2020 History of similar episodes: yes  Description of dizziness: unsteadiness  Symptom nature: motion provoked,  intermittent Auditory complaints (tinnitus, pain, drainage): pt wears left hearing aide. Vision (last eye exam, diplopia, recent changes): pt wears glasses with prisms in them. Reports getting new glasses due to change in prescription this week. H/o of diplopia.   Current Symptoms: (dysarthria, dysphagia, drop attacks, bowel and bladder changes, recent weight loss/gain)    Review of systems negative for red flags. Pt reports intentional weight loss of greater than 40 pounds.       DIAGNOSTIC FINDINGS:  PAIN:  Are you having pain? No  PRECAUTIONS: Fall  FALLS: Has patient fallen in last 6 months? No; had 1 fall 8 months ago stone gave way as he was trying to move   LIVING ENVIRONMENT: Lives with: lives with their spouse Lives in: House/apartment Stairs: 4 steps at [employer]  PLOF: Independent, [place] with basic ADLs, Independent with community mobility without device, and Leisure: gardening; drives  PATIENT GOALS:  Pt would like to be able to walk farther distances without sensation of veering and imbalance and to be able to drive and do garden work.   OBJECTIVE/ VESTIBULAR ASSESSMENT:  COGNITION: Overall cognitive status: Within functional limits for tasks assessed   SENSATION: Pt reports numbness and tingling in bilateral feet.    COORDINATION: Finger to Nose: Dysmetric  left Past Pointing: Left     POSTURE:  No Significant postural limitations  Cervical ROM:   WFL and painless in all planes AROM. No gross deficits identified.  TRANSFERS: Sit to stand: Complete Independence Stand to sit: Complete Independence  GAIT: Pt arrives to clinic ambulating without AD. Pt  ambulates with decreased cadence with reciprocal arm swing.  Scanning of visual environment with gait is: fair  ORTHOSTATICS: not done  POSTURAL CONTROL TESTS:  Clinical Test of Sensory Interaction for Balance (CTSIB): CONDITION TIME STRATEGY SWAY  Eyes open, firm surface 30 seconds ankle +2  Eyes closed, firm surface 30 seconds ankle +3  Eyes open, foam surface 30 seconds ankle +2  Eyes closed, foam surface 30 seconds ankle +3   POSITIONAL TESTING:  Deferred  OCULOMOTOR / VESTIBULAR TESTING: Oculomotor Exam- Room Light  Normal Abnormal Comments  Ocular Alignment N    Ocular ROM N    Spontaneous Nystagmus N    Gaze evoked Nystagmus N    Smooth Pursuit N    Saccades  Abn Hypometric and unable to find target in inferior field; hypometric and slow movements all fields; Planned laser eye surgery on Oct 6 "back of lens fogged over" on left eye   VOR  Abn Reports blurring and doubling of target  VOR Cancellation N    Left Head Impulse  Abn Corrective saccades noted  Right Head Impulse  Abn Corrective saccades noted  Head Shaking Nystagmus   deferred  Static Acuity   deferred  Dynamic Acuity   deferred     [facility] PT Assessment - 10/28/21 1437       Standardized Balance Assessment   Standardized Balance Assessment Dynamic Gait Index      Dynamic Gait Index   Level Surface Mild Impairment   slower gait speed   Change in Gait Speed Moderate Impairment    Gait with Horizontal Head Turns Mild Impairment    Gait with Vertical Head Turns Mild Impairment    Gait and Pivot Turn Moderate Impairment   increased dizziness with left > right turn required Min A to correct loss of balance.   Step Over Obstacle Mild Impairment    Step Around Obstacles Mild Impairment    Steps Mild Impairment    Total Score 14            FUNCTIONAL GAIT: Dynamic Gait Index: 14/24   FUNCTIONAL OUTCOME MEASURES:  Results Comments  DHI      24/100 Low perception of handicap  ABC Scale      59% Falls  risk; in need of intervention  DGI      14/24 Falls risk; in need of intervention  FOTO 55 scale (39-70) with higher numbers indicating greater function Given the patient's risk adjustment variables, like-patients nationally had a FS score of  55/100 at intake; FOTO predicts 4 points of improvement after 7 PT visits  Dizziness Positional Status (DPS) 58 Scale 39-70; in need of intervention   VESTIBULAR TREATMENT: 10/29/21 Gaze Adaptation:   x1 Viewing Horizontal: Position: sitting and then standing, Time: 30-45 seconds, Reps: 4, and Comment: added VOR X 1 in standing with 45 second reps for home exercise program.  PATIENT EDUCATION: Education details: discussed goals and plan of care; issued VOR X 1 in standing 45 second reps, feet together and semi-tandem progressions standing on pillow with vertical nad horizontal head turns for home exercise program.  Person educated: Patient Education method: Explanation, Demonstration, Verbal cues, and Handouts Education comprehension: verbalized understanding and returned demonstration  GOALS: Goals reviewed with patient?  Yes  SHORT TERM GOALS: Target date: 11/25/2021  Pt will be independent with HEP in order to improve balance and decrease dizziness symptoms in order to decrease fall risk and improve function at home and work. Baseline:  Goal status: INITIAL  LONG TERM GOALS: Target date: 01/20/2022  Patient will have improved FOTO score of 4 points or greater in order to demonstrate improvements in patient's ADLs and functional performance.  Baseline: scored 58 on 10/28/21;  Goal status: INITIAL  2.  Patient will demonstrate reduced falls risk as evidenced by Dynamic Gait Index (DGI) >19/24. Baseline: scored 14/24 on 10/28/21 Goal status: INITIAL  3.  Patient will reduce falls risk as indicated by Activities Specific Balance Confidence Scale (ABC) >67%.      Patient will have demonstrate decreased falls risk as indicated by Activities  Specific Balance Confidence Scale score of 80% or greater. Baseline: scored 59% on 10/28/21 Goal status: INITIAL  4.  Patient will be able to ambulate >500' without veering and without subjective symptoms of dizziness and imbalance. Baseline: pt reports that he feels like he veers when walking especially with longer distances.  Goal status: INITIAL  5.  Patient will report 50% or greater improvement in their symptoms of dizziness and imbalance with provoking motions or positions  Baseline:  Goal status: INITIAL  ASSESSMENT:  CLINICAL IMPRESSION: Patient is a 76 y.o. male who was seen today for physical therapy evaluation and treatment for dizziness symptoms. Patient scored 14/24 on the DGI 59% on the ABC scale indicating falls risk. These scores are decreased compared to patient's prior testing in 2020. Patient reports that he has been altering his movements in an effort to help decrease his dizziness and sensation of imbalance. Patient scored 59% on FOTO indicating decreased functional activity. Patient presents with listed functional deficits and would benefit from skilled PT services to address goals, functional deficits and to help reduce his falls risk.   OBJECTIVE IMPAIRMENTS decreased balance, difficulty walking, and dizziness.   ACTIVITY LIMITATIONS driving, shopping, and yard work.   PERSONAL FACTORS Past/current experiences, Time since onset of injury/illness/exacerbation, and 3+ comorbidities: HTN, CAD, CVA, GERD, sleep apnea, hyperlipidemia, petit mal seizures  are also affecting patient's functional outcome.   REHAB POTENTIAL: Good  CLINICAL DECISION MAKING: Evolving/moderate complexity  EVALUATION COMPLEXITY: Moderate   PLAN: PT FREQUENCY: 1x/week  PT DURATION: 12 weeks  PLANNED INTERVENTIONS: Therapeutic exercises, Therapeutic activity, Neuromuscular re-education, Balance training, Gait training, Patient/Family education, Self Care, Stair training, Vestibular  training, and [place] repositioning  PLAN FOR NEXT SESSION: Will plan on reviewing HEP and working on progressions; try ball toss to self/ball follows; amb with head turns and quick turns.   Lady Deutscher PT, DPT [phone]  Lady Deutscher, PT 10/28/2021, 1:13 PM

## 2021-11-04 ENCOUNTER — Ambulatory Visit: Payer: Medicare Other | Attending: Nurse Practitioner | Admitting: Physical Therapy

## 2021-11-04 DIAGNOSIS — R2681 Unsteadiness on feet: Secondary | ICD-10-CM | POA: Diagnosis present

## 2021-11-04 DIAGNOSIS — R42 Dizziness and giddiness: Secondary | ICD-10-CM | POA: Diagnosis present

## 2021-11-04 NOTE — Therapy (Signed)
OUTPATIENT PHYSICAL THERAPY VESTIBULAR EVALUATION     Patient Name: Johnny Mccoy MRN: [id number] DOB:Apr 26, 1945, 76 y.o., male 53 Date: 11/06/2021  PCP: Idelle Crouch, MD REFERRING PROVIDER: Neita Garnet, NP   PT End of Session - 11/06/21 1354     Visit Number 2    Number of Visits 13    Date for PT Re-Evaluation 01/20/22    PT Start Time 1559    PT Stop Time 1700    PT Time Calculation (min) 61 min    Equipment Utilized During Treatment Gait belt    Activity Tolerance Patient tolerated treatment well    Behavior During Therapy WFL for tasks assessed/performed             Past Medical History:  Diagnosis Date   (HFpEF) heart failure with preserved ejection fraction ([place]) 02/27/2014   a.) TTE 02/27/14: EF >55%; mod LVH; mild panvalvular regurgitation; (+) global stress induced HK. b.) TTE 09/20/14: EF 55%; triv AR/MR, mild TR/PR; mild AS; BAE; c.) TTE 06/20/15: EF 55%, triv AR/MR; BAE; mild AS. d.) TTE 02/11/17: EF 55; significant  RV dilation with mod systolic dysfunction; mild AS. e.) TTE 02/27/19: EF 55%; mild AS. f.) TTE 06/04/20: EF 50%; mild AS   Anxiety    Aortic stenosis 09/20/2014   a.) TTE 09/20/2014: mild; MPG?. b.) TTE 06/20/2015: mild; MPG 8.0 mmHg. c.) TTE 02/11/2017: mild; MPG 12.4 mmHg. d.) TTE 02/27/2019: mild; MPG 11.4 mmHg. e.) TTE 06/04/2020: mild; MPG 9.6 mmHg.   Arthritis    Asthma    CAD (coronary artery disease)    Cataracts, bilateral    Chronic back pain    Chronic venous insufficiency    Complication of anesthesia    a.) postoperative amnesia (1999). b.) seizures after partial knee in 2014; sent to [facility] x 5 days d/t uncontrollable shaking and AMS; ?? fentanyl induced tremors. c.) anesthesia awareness   COVID-19 05/2018   CVA (cerebral vascular accident) [facility])    Depressed    Essential tremor    GERD (gastroesophageal reflux disease)    Hepatitis B    a.) h/o in the 90s; reports not active at present   History of bronchitis     Jan-Mar 2017   History of hiatal hernia    Hyperlipidemia    Hypertension    Insomnia    Joint pain    Lymphedema    Mild cognitive impairment    Muscle spasm    in neck.Takes Flexeril as needed   Numbness    right knee,right hand,and toes on left   RBBB    Seizures (HCC)    [place]. takes Lamotrigine daily. Last seizure 6+ yrs ago   Sleep apnea with use of continuous positive airway pressure (CPAP)    VTE (venous thromboembolism)    a.) s/p RIGHT TKA   Past Surgical History:  Procedure Laterality Date   ANKLE ARTHROSCOPY Left 03/07/2020   Procedure: ANKLE ARTHROSCOPY;  Surgeon: Caroline More, DPM;  Location: [place];  Service: Podiatry;  Laterality: Left;   BACK SURGERY     x3   BLEPHAROPLASTY     CARDIAC CATHETERIZATION     2015   CARPAL TUNNEL RELEASE     COLONOSCOPY     CORONARY ANGIOPLASTY WITH STENT PLACEMENT     x 3   CORONARY ANGIOPLASTY WITH STENT PLACEMENT Left 01/30/2003   Procedure: CORONARY ANGIOPLASTY WITH STENT PLACEMENT (2.75 x 32 mm Taxus stent to RCA); Location: [facility]; Surgeon: Joycelyn Schmid  Hassett, MD   dental implants     ESOPHAGOGASTRODUODENOSCOPY     EYE SURGERY     both eyes in 60's d/t muscle causing to squint   IVC FILTER INSERTION N/A 03/04/2021   Procedure: IVC FILTER INSERTION;  Surgeon: Katha Cabal, MD;  Location: [place] [facility];  Service: Cardiovascular;  Laterality: N/A;   IVC FILTER REMOVAL N/A 05/27/2021   Procedure: IVC FILTER REMOVAL;  Surgeon: Algernon Huxley, MD;  Location: [place] [facility];  Service: Cardiovascular;  Laterality: N/A;   JOINT REPLACEMENT Right    knee   KNEE ARTHROSCOPY     LEFT HEART CATH AND CORONARY ANGIOGRAPHY Left 04/12/2019   Procedure: LEFT HEART CATH AND CORONARY ANGIOGRAPHY;  Surgeon: Corey Skains, MD;  Location: [place] [facility];  Service: Cardiovascular;  Laterality: Left;   MEDIAL PARTIAL KNEE REPLACEMENT Right    MEDIASTERNOTOMY N/A 10/28/2015   Procedure: PARTIAL  STERNOTOMY;  Surgeon: [name], MD;  Location: [place];  Service: Thoracic;  Laterality: N/A;   numerous hand surgery     ORIF ANKLE FRACTURE Left 03/07/2020   Procedure: OPEN REDUCTION INTERNAL FIXATION (ORIF) ANKLE FRACTURE;  Surgeon: Caroline More, DPM;  Location: [place];  Service: Podiatry;  Laterality: Left;   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   Procedure: RESECTION OF anterier MEDIASTINAL TUMOR;  Surgeon: [name], MD;  Location: [place];  Service: Thoracic;  Laterality: N/A;   right hand fusion     rotorblator  03/1997   anteriograde amnesia resulted   TENDON REPAIR Left 03/07/2020   Procedure: FLEXOR TENDON REPAIR;  Surgeon: Caroline More, DPM;  Location: [place];  Service: Podiatry;  Laterality: Left;  sleep apnea   TONSILLECTOMY     TOTAL KNEE REVISION Right 03/10/2021   Procedure: TOTAL KNEE REVISION;  Surgeon: Dereck Leep, MD;  Location: [facility];  Service: Orthopedics;  Laterality: Right;   UMBILICAL HERNIA REPAIR     Patient Active Problem List   Diagnosis Date Noted   History of DVT of lower extremity 06/25/2021   Hypotension 11/91/4782   Acute metabolic encephalopathy    Right knee pain    Sepsis ([place]) 03/25/2021   History of revision of total knee arthroplasty 03/10/2021   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 01/15/2021   Loose right total knee arthroplasty ([place]) 01/12/2021   Impairment of balance 01/03/2021   Muscle rigidity 01/03/2021   Arthritis 07/01/2020   Cataracts, bilateral 07/01/2020   Pneumonia, aspiration ([place]) 07/01/2020   Shingles 07/01/2020   AKI (acute kidney injury) ([place]) 06/10/2020   Hyponatremia 06/10/2020   History of seizures 95/62/1308   Acute diastolic heart failure due to valvular disease ([place]) 06/06/2020   Pedal edema 06/06/2020   At risk for delirium 08/29/2019   Angina pectoris ([place]) 03/29/2019   Idiopathic peripheral neuropathy 11/02/2018   Labyrinthine disorder 10/21/2018    Diastasis of rectus abdominis 03/18/2018   Diplopia 07/21/2017   Myopia of both eyes with astigmatism 07/21/2017   Nuclear senile cataract, bilateral 07/21/2017   Mild aortic valve stenosis 06/29/2017   History of pulmonary embolism 11/30/2016   Myopia 09/25/2016   Divergence insufficiency 09/25/2016   Impacted cerumen 09/25/2016   Meibomian gland dysfunction 09/25/2016   Presbyopia 09/25/2016   Tear film insufficiency 09/25/2016   Mild cognitive impairment 09/11/2016   Obesity (BMI 30-39.9) 09/11/2016   Spondylolisthesis of lumbar region 09/11/2016   Thrombocytopenia ([place]) 09/11/2016   Other amnesia 09/09/2016  Seizure ([place]) 09/08/2016   Tremor 09/08/2016   Pain syndrome, chronic 08/10/2016   Acute respiratory failure with hypoxemia ([place]) 07/20/2016   Delirium 07/20/2016   Hypokalemia 07/20/2016   Asthma without status asthmaticus 07/20/2016   Left lower lobe pneumonia 07/20/2016   Spinal stenosis 07/20/2016   Lymphedema 12/16/2015   Chronic venous insufficiency 12/16/2015   Leg swelling 12/16/2015   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   Asthenia 12/10/2015   Carpal tunnel syndrome of left wrist 12/09/2015   Left hemiparesis ([place]) 11/05/2015   Skin sensation disturbance 11/05/2015   Mediastinal mass 10/28/2015   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   Anxiety 05/23/2015   Acid reflux 05/23/2015   HBV (hepatitis B virus) infection 05/23/2015   History of cardiac catheterization 05/23/2015   Hyperlipidemia 05/23/2015   CAD (coronary artery disease) 05/23/2015   Anxiety and depression 05/23/2015   Cough 07/26/2014   Essential hypertension 07/16/2014   Breathlessness on exertion 02/12/2014   Valvular heart disease 07/25/2013   History of CVA (cerebrovascular accident) 06/01/2013   Altered mental status 06/21/2012   Bundle branch block, right 05/29/2012   History of knee surgery 05/26/2012   Ophthalmoplegia 01/01/2010    ONSET DATE: 2020  REFERRING DIAG: H81.92 unspecified disorder of  vestibular function, left ear; Labyrinthine disorder of left ear; R42 dizziness and giddiness  Rationale for Evaluation and Treatment Rehabilitation  THERAPY DIAG:  Dizziness and giddiness  Unsteadiness on feet   SUBJECTIVE:   SUBJECTIVE STATEMENT: Pt reports that he was seen in 2020 for physical therapy due to dizziness symptoms following an episode of long COVID. Pt states it affected the nerve that ran from the ear and eye and they did many tests. Pt states his hearing test revealed that his hearing went down in his left ear. Pt wears hearing aide on left ear. Per medical record, pt had a VNG which reported 22% left vestibular weakness at that time. Pt states that he used to have a sensation of falling to the right side but now he is getting a sensation of falling forward. Pt states that if he is going to go down stairs he gets this sensation of falling forward and he has to hold on. Patient states Dr. Nyra Capes is his ENT at [facility]. Pt states he saw his neurologist about this sensation of falling and that they referred him for vestibular PT services. Pt states his doctor told him that he has neuropathy in his feet. Pt states when he is walking he gets "tottery" and veering at times. Patient states that he walks into door frames and gets bruises. Patient states he feels unsteady when walking in the grocery store and that he holds onto the cart or his wife for support. Patient states that he is driving but states that he prefers for his wife to drive him at present. Pt states that he loves to garden but that he modifies how he gardens and he is careful when he has to bend over or lean forward as this movement will bring on dizziness if he does too much. Patient states that he has prisms in his glasses and that he had not seen the eye doctor in 2 years. Pt states he started seeing double again at times.Pt states he recently went to see the eye doctor and that they are changing his prescription and he is  picking his new glasses up this week.  Pt states that he has laser surgery for his left eye scheduled for Oct 6 as he states the "back  of the lens fogged over". Pt states he is not falling to the right now like he did back in 2020 but this time it is a more tentative feeling. Pt states for example when he tries to sweep the patio floor, he feels extremely uncomfortable like he might fall of the patio which raised up by 4 steps, and that he is nervous of trying to go up/down the patio steps. Pt states that he fell last year on the patio and slammed his right knee down an states he need to have a TKR revision done in Jan 2023. Pt states he has had 3 bouts of critical sodium drops in Jan and two in Feb in 2023 and was hospitalized each time. Pt states he is not sure if that had any affect on things but he feels his personality changed after this experience and the feeling of uncertainty has lingered. Pt states he feels he is sleeping more now. Pt states he is being seen for PT for his shoulder currently.  Per medical record, PT eval 12/30/18: Pt reports that about 3 months ago, after having COVID-19, he began having white flashes in his eyes, experiencing intermittent dizziness, and headaches.  He reports when it first started, he would feel it when he was transitioning from sitting to standing and would begin veering to the L.  He presents with baseline diplopia and wears prism lens. VNG study was negative with the exception of 22% left vestibular weakness. Pt accompanied by: self  PERTINENT HISTORY:  SYMPTOM BEHAVIOR: Non-Vestibular symptoms: changes in hearing and changes in vision Type of dizziness: Imbalance (Disequilibrium), Unsteady with head/body turns, and by midday feels like everything is turning yellow like a fog and he lies down for an hour or so and that helps him feel better Frequency: daily  Duration: minutes   Aggravating factors: Induced by position change: bending over, Induced by  motion: occur when walking, turning body quickly, and turning head quickly, and Worse outside or in busy environment Relieving factors: lying supine Progression of symptoms: better than back in 2020 History of similar episodes: yes  Description of dizziness: unsteadiness  Symptom nature: motion provoked,  intermittent Auditory complaints (tinnitus, pain, drainage): pt wears left hearing aide. Vision (last eye exam, diplopia, recent changes): pt wears glasses with prisms in them. Reports getting new glasses due to change in prescription this week. H/o of diplopia.   Current Symptoms: (dysarthria, dysphagia, drop attacks, bowel and bladder changes, recent weight loss/gain)    Review of systems negative for red flags. Pt reports intentional weight loss of greater than 40 pounds.       DIAGNOSTIC FINDINGS:  PAIN:  Are you having pain? No  PRECAUTIONS: Fall   PATIENT GOALS:  Pt would like to be able to walk farther distances without sensation of veering and imbalance and to be able to drive and do garden work.   Subjective: Pt states he tried to do his home exercise program and the semitandem stance exercises where the most challenging. Pt report the horizontal head movements are more challenging than the vertical. Pt states he has his eye laser surgery tomorrow.  OBJECTIVE:  VESTIBULAR TREATMENT:    11/04/21: Reviewed HEP VOR x 1 exercise: Patient performed VOR X 1 horizontal in standing 1 rep of 1 minute on firm surface and then 2 reps of 1 minute on Airex pad with verbal cues initially for amount of head turn excursion as patient was turning head too far and to decrease  head turning speed as the target began to get blurry and doubled. Patient reports mild dizziness symptoms with this exercise.  Airex pad:  On firm surface and then on Airex pad, patient performed feet together progressions and semi-tandem progressions with alternating lead leg with and without body turns and horizontal  and vertical head turns with contact guard assistance. Verbal cues provided for foot placement with semi-tandem stance.  Patient reports imbalance with these activities.  Added body turns to home exercise program.   Ambulation with head turns:  Patient performed 175' forwards and retro ambulation while scanning for targets in hallway with contact guard assistance/ Min A secondary to imbalance and a few small losses of balance.  Patient reports 6/10 dizziness and imbalance with retro-ambulation with  head turns. Pt had increased dizziness with these activities and required short standing rest breaks in order for the dizziness rating to decrease.   Diona Foley toss to self:  Patient performed static standing while tossing ball to self horizontal and then vertical while tracking ball with head and eyes.  Then, worked on ambulating in // bars while performing ball toss with head and eye follows multiple reps with CGA.   Ball circles: Worked on standing with feet together while holding ball and doing clockwise circles from small to large size while tracking with head and eyes and then reverse direction counterclockwise circles moving from large to smaller circles with CGA.   Note: Added Diona Foley toss to self and ball circles in standing for HEP and demonstrated and discussed how to perform these exercises standing in corner with chair in front for safety. Pt demonstrated back and verbalized understanding.    10/29/21 Gaze Adaptation:   x1 Viewing Horizontal: Position: sitting and then standing, Time: 30-45 seconds, Reps: 4, and Comment: added VOR X 1 in standing with 45 second reps for home exercise program.  PATIENT EDUCATION: Education details: Added Diona Foley toss to self and ball circles in standing for HEP and demonstrated and discussed how to perform these exercises standing in corner with chair in front for safety. Pt demonstrated back and verbalized understanding.   Person educated: Patient Education  method: Explanation, Demonstration, Verbal cues, and Handouts Education comprehension: verbalized understanding and returned demonstration  Home Exercise Program: -Feet together and semi-tandem progressions standing on pillow with horizontal and vertical head turns and body turns -VOR X 1 in standing -Ball toss to self in standing -Ball circles in standing  GOALS: Goals reviewed with patient? Yes  SHORT TERM GOALS: Target date: 11/25/2021  Pt will be independent with HEP in order to improve balance and decrease dizziness symptoms in order to decrease fall risk and improve function at home and work. Baseline:  Goal status: INITIAL  LONG TERM GOALS: Target date: 01/20/2022  Patient will have improved FOTO score of 4 points or greater in order to demonstrate improvements in patient's ADLs and functional performance.  Baseline: scored 58 on 10/28/21;  Goal status: INITIAL  2.  Patient will demonstrate reduced falls risk as evidenced by Dynamic Gait Index (DGI) >19/24. Baseline: scored 14/24 on 10/28/21 Goal status: INITIAL  3.  Patient will reduce falls risk as indicated by Activities Specific Balance Confidence Scale (ABC) >67%.      Patient will have demonstrate decreased falls risk as indicated by Activities Specific Balance Confidence Scale score of 80% or greater. Baseline: scored 59% on 10/28/21 Goal status: INITIAL  4.  Patient will be able to ambulate >500' without veering and without subjective symptoms of dizziness  and imbalance. Baseline: pt reports that he feels like he veers when walking especially with longer distances.  Goal status: INITIAL  5.  Patient will report 50% or greater improvement in their symptoms of dizziness and imbalance with provoking motions or positions  Baseline:  Goal status: INITIAL  ASSESSMENT:  CLINICAL IMPRESSION: Pt motivated to session and reports compliance with performing home exercise program. Pt was able to progress to 1 minute reps  with VOR X 1 exercise and adding body turns to feet together and semi-tandem stance with head turns. Patient was most challenged by ambulation with head turns scanning for targets this date and would benefit from continued practice. Pt would benefit from continued PT services to try to further decrease pt's subjective symptoms of dizziness and imbalance and to try to address goals.   OBJECTIVE IMPAIRMENTS decreased balance, difficulty walking, and dizziness.   ACTIVITY LIMITATIONS driving, shopping, and yard work.   PERSONAL FACTORS Past/current experiences, Time since onset of injury/illness/exacerbation, and 3+ comorbidities: HTN, CAD, CVA, GERD, sleep apnea, hyperlipidemia, petit mal seizures  are also affecting patient's functional outcome.   REHAB POTENTIAL: Good  CLINICAL DECISION MAKING: Evolving/moderate complexity  EVALUATION COMPLEXITY: Moderate   PLAN: PT FREQUENCY: 1x/week  PT DURATION: 12 weeks  PLANNED INTERVENTIONS: Therapeutic exercises, Therapeutic activity, Neuromuscular re-education, Balance training, Gait training, Patient/Family education, Self Care, Stair training, Vestibular training, and Canalith repositioning  PLAN FOR NEXT SESSION:  Will plan on working on progressions of balance exercises, amb with diagonal head turns and quick turns and ambulation with ball toss over shoulder.   Lady Deutscher PT, DPT [phone]  Lady Deutscher, PT 11/06/2021, 1:55 PM

## 2021-11-06 ENCOUNTER — Encounter: Payer: Self-pay | Admitting: Physical Therapy

## 2021-11-18 ENCOUNTER — Encounter: Payer: Self-pay | Admitting: Physical Therapy

## 2021-11-18 ENCOUNTER — Ambulatory Visit: Payer: Medicare Other | Admitting: Physical Therapy

## 2021-11-18 DIAGNOSIS — R2681 Unsteadiness on feet: Secondary | ICD-10-CM

## 2021-11-18 DIAGNOSIS — R42 Dizziness and giddiness: Secondary | ICD-10-CM

## 2021-11-18 NOTE — Therapy (Signed)
OUTPATIENT PHYSICAL THERAPY VESTIBULAR TREATMENT NOTE     Patient Name: Johnny Mccoy MRN: [id number] DOB:[birth year], 76 y.o., male Today's Date: 11/18/2021  PCP: Idelle Crouch, MD REFERRING PROVIDER: Neita Garnet, NP   PT End of Session - 11/18/21 1028     Visit Number 3    Number of Visits 13    Date for PT Re-Evaluation 01/20/22    PT Start Time 7001    PT Stop Time 1114    PT Time Calculation (min) 46 min    Equipment Utilized During Treatment Gait belt    Activity Tolerance Patient tolerated treatment well    Behavior During Therapy WFL for tasks assessed/performed             Past Medical History:  Diagnosis Date   (HFpEF) heart failure with preserved ejection fraction ([place]) 02/27/2014   a.) TTE 02/27/14: EF >55%; mod LVH; mild panvalvular regurgitation; (+) global stress induced HK. b.) TTE 09/20/14: EF 55%; triv AR/MR, mild TR/PR; mild AS; BAE; c.) TTE 06/20/15: EF 55%, triv AR/MR; BAE; mild AS. d.) TTE 02/11/17: EF 55; significant  RV dilation with mod systolic dysfunction; mild AS. e.) TTE 02/27/19: EF 55%; mild AS. f.) TTE 06/04/20: EF 50%; mild AS   Anxiety    Aortic stenosis 09/20/2014   a.) TTE 09/20/2014: mild; MPG?. b.) TTE 06/20/2015: mild; MPG 8.0 mmHg. c.) TTE 02/11/2017: mild; MPG 12.4 mmHg. d.) TTE 02/27/2019: mild; MPG 11.4 mmHg. e.) TTE 06/04/2020: mild; MPG 9.6 mmHg.   Arthritis    Asthma    CAD (coronary artery disease)    Cataracts, bilateral    Chronic back pain    Chronic venous insufficiency    Complication of anesthesia    a.) postoperative amnesia (1999). b.) seizures after partial knee in 2014; sent to [facility] x 5 days d/t uncontrollable shaking and AMS; ?? fentanyl induced tremors. 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 Last seizure 6+ yrs ago   Sleep apnea with use of continuous positive airway pressure (CPAP)    VTE (venous thromboembolism)    a.) s/p RIGHT TKA   Past Surgical History:  Procedure Laterality Date   ANKLE ARTHROSCOPY Left 03/07/2020   Procedure: ANKLE ARTHROSCOPY;  Surgeon: Caroline More, DPM;  Location: [place];  Service: Podiatry;  Laterality: Left;   BACK SURGERY     x3   BLEPHAROPLASTY     CARDIAC CATHETERIZATION     2015   CARPAL TUNNEL RELEASE     COLONOSCOPY     CORONARY ANGIOPLASTY WITH STENT PLACEMENT     x 3   CORONARY ANGIOPLASTY WITH STENT PLACEMENT Left 01/30/2003   Procedure: CORONARY ANGIOPLASTY WITH STENT PLACEMENT (2.75 x 32 mm Taxus stent to RCA); Location: [facility]; Surgeon:  Courtney Paris, MD   dental implants     ESOPHAGOGASTRODUODENOSCOPY     EYE SURGERY     both eyes in 60's d/t muscle causing to squint   IVC FILTER INSERTION N/A 03/04/2021   Procedure: IVC FILTER INSERTION;  Surgeon: Katha Cabal, MD;  Location: [place] [facility];  Service: Cardiovascular;  Laterality: N/A;   IVC FILTER REMOVAL N/A 05/27/2021   Procedure: IVC FILTER REMOVAL;  Surgeon: Algernon Huxley, MD;  Location: [place] [facility];  Service: Cardiovascular;  Laterality: N/A;   JOINT REPLACEMENT Right    knee   KNEE ARTHROSCOPY     LEFT HEART CATH AND CORONARY ANGIOGRAPHY Left 04/12/2019   Procedure: LEFT HEART CATH AND CORONARY ANGIOGRAPHY;  Surgeon: Corey Skains, MD;  Location: [place] [facility];  Service: Cardiovascular;  Laterality: Left;   MEDIAL PARTIAL KNEE REPLACEMENT Right    MEDIASTERNOTOMY N/A 10/28/2015   Procedure: PARTIAL  STERNOTOMY;  Surgeon: [name], MD;  Location: [place];  Service: Thoracic;  Laterality: N/A;   numerous hand surgery     ORIF ANKLE FRACTURE Left 03/07/2020   Procedure: OPEN REDUCTION INTERNAL FIXATION (ORIF) ANKLE FRACTURE;  Surgeon: Caroline More, DPM;  Location: [place];  Service: Podiatry;  Laterality: Left;   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   Procedure: RESECTION OF anterier MEDIASTINAL TUMOR;  Surgeon: [name], MD;  Location: [place];  Service: Thoracic;  Laterality: N/A;   right hand fusion     rotorblator  03/1997   anteriograde amnesia resulted   TENDON REPAIR Left 03/07/2020   Procedure: FLEXOR TENDON REPAIR;  Surgeon: Caroline More, DPM;  Location: [place];  Service: Podiatry;  Laterality: Left;  sleep apnea   TONSILLECTOMY     TOTAL KNEE REVISION Right 03/10/2021   Procedure: TOTAL KNEE REVISION;  Surgeon: Dereck Leep, MD;  Location: [facility];  Service: Orthopedics;  Laterality: Right;   UMBILICAL HERNIA REPAIR     Patient Active Problem List   Diagnosis Date Noted   History of DVT of lower extremity 06/25/2021   Hypotension 01/74/9449   Acute metabolic encephalopathy    Right knee pain    Sepsis ([place]) 03/25/2021   History of revision of total knee arthroplasty 03/10/2021   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 01/15/2021   Loose right total knee arthroplasty ([place]) 01/12/2021   Impairment of balance 01/03/2021   Muscle rigidity 01/03/2021   Arthritis 07/01/2020   Cataracts, bilateral 07/01/2020   Pneumonia, aspiration ([place]) 07/01/2020   Shingles 07/01/2020   AKI (acute kidney injury) ([place]) 06/10/2020   Hyponatremia 06/10/2020   History of seizures 67/59/1638   Acute diastolic heart failure due to valvular disease ([place]) 06/06/2020   Pedal edema 06/06/2020   At risk for delirium 08/29/2019   Angina pectoris ([place]) 03/29/2019   Idiopathic peripheral neuropathy 11/02/2018   Labyrinthine disorder 10/21/2018    Diastasis of rectus abdominis 03/18/2018   Diplopia 07/21/2017   Myopia of both eyes with astigmatism 07/21/2017   Nuclear senile cataract, bilateral 07/21/2017   Mild aortic valve stenosis 06/29/2017   History of pulmonary embolism 11/30/2016   Myopia 09/25/2016   Divergence insufficiency 09/25/2016   Impacted cerumen 09/25/2016   Meibomian gland dysfunction 09/25/2016   Presbyopia 09/25/2016   Tear film insufficiency 09/25/2016   Mild cognitive impairment 09/11/2016   Obesity (BMI 30-39.9) 09/11/2016   Spondylolisthesis of lumbar region 09/11/2016   Thrombocytopenia ([place]) 09/11/2016   Other amnesia  09/09/2016   Seizure ([place]) 09/08/2016   Tremor 09/08/2016   Pain syndrome, chronic 08/10/2016   Acute respiratory failure with hypoxemia ([place]) 07/20/2016   Delirium 07/20/2016   Hypokalemia 07/20/2016   Asthma without status asthmaticus 07/20/2016   Left lower lobe pneumonia 07/20/2016   Spinal stenosis 07/20/2016   Lymphedema 12/16/2015   Chronic venous insufficiency 12/16/2015   Leg swelling 12/16/2015   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   Asthenia 12/10/2015   Carpal tunnel syndrome of left wrist 12/09/2015   Left hemiparesis ([place]) 11/05/2015   Skin sensation disturbance 11/05/2015   Mediastinal mass 10/28/2015   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   Anxiety 05/23/2015   Acid reflux 05/23/2015   HBV (hepatitis B virus) infection 05/23/2015   History of cardiac catheterization 05/23/2015   Hyperlipidemia 05/23/2015   CAD (coronary artery disease) 05/23/2015   Anxiety and depression 05/23/2015   Cough 07/26/2014   Essential hypertension 07/16/2014   Breathlessness on exertion 02/12/2014   Valvular heart disease 07/25/2013   History of CVA (cerebrovascular accident) 06/01/2013   Altered mental status 06/21/2012   Bundle branch block, right 05/29/2012   History of knee surgery 05/26/2012   Ophthalmoplegia 01/01/2010   ONSET DATE: 2020  REFERRING DIAG: H81.92 unspecified disorder of  vestibular function, left ear; Labyrinthine disorder of left ear; R42 dizziness and giddiness  Rationale for Evaluation and Treatment Rehabilitation  THERAPY DIAG:  Dizziness and giddiness  Unsteadiness on feet  SUBJECTIVE:   PERTINENT HISTORY: Pt reports that he was seen in 2020 for physical therapy due to dizziness symptoms following an episode of long COVID. Pt states it affected the nerve that ran from the ear and eye and they did many tests. Pt states his hearing test revealed that his hearing went down in his left ear. Pt wears hearing aide on left ear. Per medical record, pt had a VNG which reported 22% left vestibular weakness at that time. Pt states that he used to have a sensation of falling to the right side but now he is getting a sensation of falling forward. Pt states that if he is going to go down stairs he gets this sensation of falling forward and he has to hold on. Patient states Dr. Nyra Capes is his ENT at [facility]. Pt states he saw his neurologist about this sensation of falling and that they referred him for vestibular PT services. Pt states his doctor told him that he has neuropathy in his feet. Pt states when he is walking he gets "tottery" and veering at times. Patient states that he walks into door frames and gets bruises. Patient states he feels unsteady when walking in the grocery store and that he holds onto the cart or his wife for support. Patient states that he is driving but states that he prefers for his wife to drive him at present. Pt states that he loves to garden but that he modifies how he gardens and he is careful when he has to bend over or lean forward as this movement will bring on dizziness if he does too much. Patient states that he has prisms in his glasses and that he had not seen the eye doctor in 2 years. Pt states he started seeing double again at times.Pt states he recently went to see the eye doctor and that they are changing his prescription and he is picking  his new glasses up this week.  Pt states that he has laser surgery for his left eye scheduled for Oct 6 as he states the "  back of the lens fogged over". Pt states he is not falling to the right now like he did back in 2020 but this time it is a more tentative feeling. Pt states for example when he tries to sweep the patio floor, he feels extremely uncomfortable like he might fall of the patio which raised up by 4 steps, and that he is nervous of trying to go up/down the patio steps. Pt states that he fell last year on the patio and slammed his right knee down an states he need to have a TKR revision done in Jan 2023. Pt states he has had 3 bouts of critical sodium drops in Jan and two in Feb in 2023 and was hospitalized each time. Pt states he is not sure if that had any affect on things but he feels his personality changed after this experience and the feeling of uncertainty has lingered. Pt states he feels he is sleeping more now. Pt states he is being seen for PT for his shoulder currently.  Per medical record, PT eval 12/30/18: Pt reports that about 3 months ago, after having COVID-19, he began having white flashes in his eyes, experiencing intermittent dizziness, and headaches.  He reports when it first started, he would feel it when he was transitioning from sitting to standing and would begin veering to the L.  He presents with baseline diplopia and wears prism lens. VNG study was negative with the exception of 22% left vestibular weakness. Pt accompanied by: self  SYMPTOM BEHAVIOR: Non-Vestibular symptoms: changes in hearing and changes in vision Type of dizziness: Imbalance (Disequilibrium), Unsteady with head/body turns, and by midday feels like everything is turning yellow like a fog and he lies down for an hour or so and that helps him feel better Frequency: daily  Duration: minutes   Aggravating factors: Induced by position change: bending over, Induced by motion: occur when walking, turning  body quickly, and turning head quickly, and Worse outside or in busy environment Relieving factors: lying supine Progression of symptoms: better than back in 2020 History of similar episodes: yes  Description of dizziness: unsteadiness  Symptom nature: motion provoked,  intermittent Auditory complaints (tinnitus, pain, drainage): pt wears left hearing aide. Vision (last eye exam, diplopia, recent changes): pt wears glasses with prisms in them. Reports getting new glasses due to change in prescription this week. H/o of diplopia.   Current Symptoms: (dysarthria, dysphagia, drop attacks, bowel and bladder changes, recent weight loss/gain)    Review of systems negative for red flags. Pt reports intentional weight loss of greater than 40 pounds.       DIAGNOSTIC FINDINGS:  PAIN:  Are you having pain? No  except right upper arm soreness from recent RSV shot.   PRECAUTIONS: Fall   PATIENT GOALS:  Pt would like to be able to walk farther distances without sensation of veering and imbalance and to be able to drive and do garden work.   SUBJECTIVE: Pt states he did well with the laser surgery and that it went well. Patient states his dizziness symptoms have been "not too bad". Pt states he did not veer when he walked in from the parking lot. Pt states when he got his new glasses with prescription, and that he noticed a large improvement in his vision. Reports these have prisms in the lenses too. Pt reports he got an RSV shot in his right shoulder and that his arm is still sore from getting the shot a week ago.  OBJECTIVE:  VESTIBULAR TREATMENT:    11/18/21:  Ambulation with head turns:  Patient performed 150' trials of forwards and retro ambulation with diagonal head turns with CGA.  Patient demonstrates no veering with retro ambulation with head turns this date.  Diona Foley toss over shoulder: Patient performed multiple 175' trials of forward and retro ambulation while tossing ball over one  shoulder with return catch over opposite shoulder with CGA.  Patient performed multiple 175' trials of forward and retro ambulation while tossing ball over one shoulder with return catch over opposite shoulder varying the ball position to head, shoulder and waist level to promote head turning and tilting with CGA.  Noted mild unsteadiness at times.   Quick Turns:  Performed about 8 reps walking 8' with alternating quick turns left/right with CGA.  Patient denies dizziness with this activity and demonstrated no evidence of imbalance.   11/04/21: Reviewed HEP VOR x 1 exercise: Patient performed VOR X 1 horizontal in standing 1 rep of 1 minute on firm surface and then 2 reps of 1 minute on Airex pad with verbal cues initially for amount of head turn excursion as patient was turning head too far and to decrease head turning speed as the target began to get blurry and doubled. Patient reports mild dizziness symptoms with this exercise.  Airex pad:  On firm surface and then on Airex pad, patient performed feet together progressions and semi-tandem progressions with alternating lead leg with and without body turns and horizontal and vertical head turns with contact guard assistance. Verbal cues provided for foot placement with semi-tandem stance.  Patient reports imbalance with these activities.  Added body turns to home exercise program.   Ambulation with head turns:  Patient performed 175' forwards and retro ambulation while scanning for targets in hallway with contact guard assistance/ Min A secondary to imbalance and a few small losses of balance.  Patient reports 6/10 dizziness and imbalance with retro-ambulation with  head turns. Pt had increased dizziness with these activities and required short standing rest breaks in order for the dizziness rating to decrease.   Diona Foley toss to self:  Patient performed static standing while tossing ball to self horizontal and then vertical while tracking ball  with head and eyes.  Then, worked on ambulating in // bars while performing ball toss with head and eye follows multiple reps with CGA.   Ball circles: Worked on standing with feet together while holding ball and doing clockwise circles from small to large size while tracking with head and eyes and then reverse direction counterclockwise circles moving from large to smaller circles with CGA.   Note: Added Diona Foley toss to self and ball circles in standing for HEP and demonstrated and discussed how to perform these exercises standing in corner with chair in front for safety. Pt demonstrated back and verbalized understanding.   10/29/21 Gaze Adaptation:   x1 Viewing Horizontal: Position: sitting and then standing, Time: 30-45 seconds, Reps: 4, and Comment: added VOR X 1 in standing with 45 second reps for home exercise program.  PATIENT EDUCATION: Education details: Added Diona Foley toss to self and ball circles in standing for HEP and demonstrated and discussed how to perform these exercises standing in corner with chair in front for safety. Pt demonstrated back and verbalized understanding.   Person educated: Patient Education method: Explanation, Demonstration, Verbal cues, and Handouts Education comprehension: verbalized understanding and returned demonstration  Home Exercise Program: -Feet together and semi-tandem progressions standing on pillow with horizontal and  vertical head turns and body turns -VOR X 1 in standing -Ball toss to self in standing -Ball circles in standing  GOALS: Goals reviewed with patient? Yes  SHORT TERM GOALS: Target date: 11/25/2021  Pt will be independent with HEP in order to improve balance and decrease dizziness symptoms in order to decrease fall risk and improve function at home and work. Baseline:  Goal status: MET  LONG TERM GOALS: Target date: 01/20/2022  Patient will have improved FOTO score of 4 points or greater in order to demonstrate improvements in  patient's ADLs and functional performance.  Baseline: scored 58 on 10/28/21;  Goal status: INITIAL  2.  Patient will demonstrate reduced falls risk as evidenced by Dynamic Gait Index (DGI) >19/24. Baseline: scored 14/24 on 10/28/21 Goal status: INITIAL  3.  Patient will reduce falls risk as indicated by Activities Specific Balance Confidence Scale (ABC) >67%.      Patient will have demonstrate decreased falls risk as indicated by Activities Specific Balance Confidence Scale score of 80% or greater. Baseline: scored 59% on 10/28/21 Goal status: INITIAL  4.  Patient will be able to ambulate >500' without veering and without subjective symptoms of dizziness and imbalance. Baseline: pt reports that he feels like he veers when walking especially with longer distances.  Goal status: INITIAL  5.  Patient will report 50% or greater improvement in their symptoms of dizziness and imbalance with provoking motions or positions  Baseline:  Goal status: INITIAL  ASSESSMENT:  CLINICAL IMPRESSION: Pt continues to demonstrate good motivation to activities during session. Pt demonstrating good improvements from prior weeks as he was better able to tolerate forward and retro ambulation  with ball toss over shoulder with decreased dizziness symptoms and mild unsteadiness noted only a few times. Pt also demonstrated improvements with quick turns this session and he was able to perform without dizziness or imbalance this date. Pt would benefit from continued PT services to try to further address goals and functional deficits.   OBJECTIVE IMPAIRMENTS decreased balance, difficulty walking, and dizziness.   ACTIVITY LIMITATIONS driving, shopping, and yard work.   PERSONAL FACTORS Past/current experiences, Time since onset of injury/illness/exacerbation, and 3+ comorbidities: HTN, CAD, CVA, GERD, sleep apnea, hyperlipidemia, petit mal seizures  are also affecting patient's functional outcome.   REHAB POTENTIAL:  Good  CLINICAL DECISION MAKING: Evolving/moderate complexity  EVALUATION COMPLEXITY: Moderate   PLAN: PT FREQUENCY: 1x/week  PT DURATION: 12 weeks  PLANNED INTERVENTIONS: Therapeutic exercises, Therapeutic activity, Neuromuscular re-education, Balance training, Gait training, Patient/Family education, Self Care, Stair training, Vestibular training, and Canalith repositioning  PLAN FOR NEXT SESSION:  Will plan on working on progressions of balance exercises, VOR with conflicting background, amb with head turns in busy environment, amb with ball toss to self.   Lady Deutscher PT, DPT [phone]  Lady Deutscher, PT 11/18/2021, 11:19 AM

## 2021-11-25 ENCOUNTER — Ambulatory Visit: Payer: Medicare Other | Admitting: Physical Therapy

## 2021-12-02 ENCOUNTER — Ambulatory Visit: Payer: Medicare Other | Admitting: Physical Therapy

## 2021-12-09 ENCOUNTER — Ambulatory Visit: Payer: Medicare Other | Admitting: Physical Therapy

## 2021-12-16 ENCOUNTER — Ambulatory Visit: Payer: Medicare Other | Admitting: Physical Therapy

## 2021-12-23 ENCOUNTER — Ambulatory Visit: Payer: Medicare Other | Admitting: Physical Therapy

## 2021-12-25 ENCOUNTER — Encounter (INDEPENDENT_AMBULATORY_CARE_PROVIDER_SITE_OTHER): Payer: Self-pay

## 2021-12-25 ENCOUNTER — Ambulatory Visit (INDEPENDENT_AMBULATORY_CARE_PROVIDER_SITE_OTHER): Payer: Medicare Other | Admitting: Vascular Surgery

## 2021-12-29 ENCOUNTER — Encounter (INDEPENDENT_AMBULATORY_CARE_PROVIDER_SITE_OTHER): Payer: Self-pay

## 2021-12-30 ENCOUNTER — Ambulatory Visit: Payer: Medicare Other | Admitting: Physical Therapy

## 2022-01-06 ENCOUNTER — Ambulatory Visit: Payer: Medicare Other | Admitting: Physical Therapy

## 2022-01-13 ENCOUNTER — Ambulatory Visit: Payer: Medicare Other | Admitting: Physical Therapy

## 2022-01-20 ENCOUNTER — Ambulatory Visit: Payer: Medicare Other | Admitting: Physical Therapy

## 2022-03-24 ENCOUNTER — Other Ambulatory Visit: Payer: Self-pay | Admitting: Student

## 2022-03-24 DIAGNOSIS — M7581 Other shoulder lesions, right shoulder: Secondary | ICD-10-CM

## 2022-03-24 DIAGNOSIS — M25811 Other specified joint disorders, right shoulder: Secondary | ICD-10-CM

## 2022-03-24 DIAGNOSIS — M7521 Bicipital tendinitis, right shoulder: Secondary | ICD-10-CM

## 2022-03-24 DIAGNOSIS — G8929 Other chronic pain: Secondary | ICD-10-CM

## 2022-03-26 ENCOUNTER — Encounter
Admission: EM | Disposition: A | Discharge status: Home / Self Care | Payer: Self-pay | Source: Home / Self Care | Attending: Osteopathic Medicine

## 2022-03-26 ENCOUNTER — Inpatient Hospital Stay
Admission: EM | Admit: 2022-03-26 | Discharge: 2022-03-29 | DRG: 163 | Disposition: A | Discharge status: Home / Self Care | Payer: Medicare Other | Attending: Osteopathic Medicine | Admitting: Osteopathic Medicine

## 2022-03-26 ENCOUNTER — Other Ambulatory Visit: Payer: Self-pay

## 2022-03-26 ENCOUNTER — Emergency Department: Payer: Medicare Other

## 2022-03-26 DIAGNOSIS — Z96651 Presence of right artificial knee joint: Secondary | ICD-10-CM | POA: Diagnosis present

## 2022-03-26 DIAGNOSIS — Z8673 Personal history of transient ischemic attack (TIA), and cerebral infarction without residual deficits: Secondary | ICD-10-CM | POA: Diagnosis not present

## 2022-03-26 DIAGNOSIS — R569 Unspecified convulsions: Secondary | ICD-10-CM

## 2022-03-26 DIAGNOSIS — Z823 Family history of stroke: Secondary | ICD-10-CM | POA: Diagnosis not present

## 2022-03-26 DIAGNOSIS — I2609 Other pulmonary embolism with acute cor pulmonale: Secondary | ICD-10-CM | POA: Diagnosis present

## 2022-03-26 DIAGNOSIS — I110 Hypertensive heart disease with heart failure: Secondary | ICD-10-CM | POA: Diagnosis present

## 2022-03-26 DIAGNOSIS — I459 Conduction disorder, unspecified: Principal | ICD-10-CM

## 2022-03-26 DIAGNOSIS — Z7982 Long term (current) use of aspirin: Secondary | ICD-10-CM

## 2022-03-26 DIAGNOSIS — I10 Essential (primary) hypertension: Secondary | ICD-10-CM | POA: Diagnosis present

## 2022-03-26 DIAGNOSIS — R7303 Prediabetes: Secondary | ICD-10-CM | POA: Diagnosis present

## 2022-03-26 DIAGNOSIS — F419 Anxiety disorder, unspecified: Secondary | ICD-10-CM | POA: Diagnosis present

## 2022-03-26 DIAGNOSIS — Z8616 Personal history of COVID-19: Secondary | ICD-10-CM

## 2022-03-26 DIAGNOSIS — Z86718 Personal history of other venous thrombosis and embolism: Secondary | ICD-10-CM

## 2022-03-26 DIAGNOSIS — E2740 Unspecified adrenocortical insufficiency: Secondary | ICD-10-CM | POA: Insufficient documentation

## 2022-03-26 DIAGNOSIS — J9601 Acute respiratory failure with hypoxia: Secondary | ICD-10-CM | POA: Diagnosis present

## 2022-03-26 DIAGNOSIS — Z825 Family history of asthma and other chronic lower respiratory diseases: Secondary | ICD-10-CM

## 2022-03-26 DIAGNOSIS — J690 Pneumonitis due to inhalation of food and vomit: Secondary | ICD-10-CM | POA: Diagnosis present

## 2022-03-26 DIAGNOSIS — I2510 Atherosclerotic heart disease of native coronary artery without angina pectoris: Secondary | ICD-10-CM | POA: Diagnosis present

## 2022-03-26 DIAGNOSIS — R001 Bradycardia, unspecified: Secondary | ICD-10-CM | POA: Diagnosis present

## 2022-03-26 DIAGNOSIS — I5032 Chronic diastolic (congestive) heart failure: Secondary | ICD-10-CM | POA: Diagnosis present

## 2022-03-26 DIAGNOSIS — E785 Hyperlipidemia, unspecified: Secondary | ICD-10-CM | POA: Diagnosis present

## 2022-03-26 DIAGNOSIS — J45909 Unspecified asthma, uncomplicated: Secondary | ICD-10-CM | POA: Diagnosis present

## 2022-03-26 DIAGNOSIS — G40A09 Absence epileptic syndrome, not intractable, without status epilepticus: Secondary | ICD-10-CM | POA: Diagnosis present

## 2022-03-26 DIAGNOSIS — R911 Solitary pulmonary nodule: Secondary | ICD-10-CM | POA: Diagnosis present

## 2022-03-26 DIAGNOSIS — I2699 Other pulmonary embolism without acute cor pulmonale: Secondary | ICD-10-CM | POA: Diagnosis not present

## 2022-03-26 DIAGNOSIS — Z7985 Long-term (current) use of injectable non-insulin antidiabetic drugs: Secondary | ICD-10-CM

## 2022-03-26 DIAGNOSIS — Z8249 Family history of ischemic heart disease and other diseases of the circulatory system: Secondary | ICD-10-CM

## 2022-03-26 DIAGNOSIS — E876 Hypokalemia: Secondary | ICD-10-CM | POA: Diagnosis present

## 2022-03-26 DIAGNOSIS — I4510 Unspecified right bundle-branch block: Secondary | ICD-10-CM | POA: Diagnosis present

## 2022-03-26 DIAGNOSIS — Z79899 Other long term (current) drug therapy: Secondary | ICD-10-CM

## 2022-03-26 DIAGNOSIS — J4540 Moderate persistent asthma, uncomplicated: Secondary | ICD-10-CM | POA: Diagnosis not present

## 2022-03-26 DIAGNOSIS — G4730 Sleep apnea, unspecified: Secondary | ICD-10-CM | POA: Diagnosis present

## 2022-03-26 DIAGNOSIS — Z86711 Personal history of pulmonary embolism: Secondary | ICD-10-CM

## 2022-03-26 DIAGNOSIS — J9811 Atelectasis: Secondary | ICD-10-CM | POA: Diagnosis present

## 2022-03-26 DIAGNOSIS — Z791 Long term (current) use of non-steroidal anti-inflammatories (NSAID): Secondary | ICD-10-CM

## 2022-03-26 DIAGNOSIS — G250 Essential tremor: Secondary | ICD-10-CM | POA: Diagnosis present

## 2022-03-26 DIAGNOSIS — Z9989 Dependence on other enabling machines and devices: Secondary | ICD-10-CM

## 2022-03-26 DIAGNOSIS — Z885 Allergy status to narcotic agent status: Secondary | ICD-10-CM

## 2022-03-26 DIAGNOSIS — Z83438 Family history of other disorder of lipoprotein metabolism and other lipidemia: Secondary | ICD-10-CM

## 2022-03-26 DIAGNOSIS — Z955 Presence of coronary angioplasty implant and graft: Secondary | ICD-10-CM

## 2022-03-26 DIAGNOSIS — Z7951 Long term (current) use of inhaled steroids: Secondary | ICD-10-CM

## 2022-03-26 DIAGNOSIS — Z01810 Encounter for preprocedural cardiovascular examination: Secondary | ICD-10-CM | POA: Diagnosis not present

## 2022-03-26 DIAGNOSIS — F32A Depression, unspecified: Secondary | ICD-10-CM | POA: Diagnosis present

## 2022-03-26 DIAGNOSIS — K219 Gastro-esophageal reflux disease without esophagitis: Secondary | ICD-10-CM | POA: Diagnosis present

## 2022-03-26 DIAGNOSIS — Z888 Allergy status to other drugs, medicaments and biological substances status: Secondary | ICD-10-CM

## 2022-03-26 HISTORY — PX: PULMONARY THROMBECTOMY: CATH118295

## 2022-03-26 HISTORY — PX: IVC FILTER INSERTION: CATH118245

## 2022-03-26 LAB — COMPREHENSIVE METABOLIC PANEL
ALT: 13 U/L (ref 0–44)
AST: 18 U/L (ref 15–41)
Albumin: 3.9 g/dL (ref 3.5–5.0)
Alkaline Phosphatase: 42 U/L (ref 38–126)
Anion gap: 11 (ref 5–15)
BUN: 11 mg/dL (ref 8–23)
CO2: 29 mmol/L (ref 22–32)
Calcium: 8.7 mg/dL — ABNORMAL LOW (ref 8.9–10.3)
Chloride: 91 mmol/L — ABNORMAL LOW (ref 98–111)
Creatinine, Ser: 1.06 mg/dL (ref 0.61–1.24)
GFR, Estimated: >60 mL/min (ref >60)
Glucose, Bld: 124 mg/dL — ABNORMAL HIGH (ref 70–99)
Potassium: 2.6 mmol/L — CL (ref 3.5–5.1)
Sodium: 131 mmol/L — ABNORMAL LOW (ref 135–145)
Total Bilirubin: 1.5 mg/dL — ABNORMAL HIGH (ref 0.3–1.2)
Total Protein: 7.3 g/dL (ref 6.5–8.1)

## 2022-03-26 LAB — PHOSPHORUS: Phosphorus: 2.6 mg/dL (ref 2.5–4.6)

## 2022-03-26 LAB — URINALYSIS, ROUTINE W REFLEX MICROSCOPIC
Bacteria, UA: NONE SEEN
Bilirubin Urine: NEGATIVE
Glucose, UA: NEGATIVE mg/dL
Hgb urine dipstick: NEGATIVE
Ketones, ur: 20 mg/dL — AB
Leukocytes,Ua: NEGATIVE
Nitrite: NEGATIVE
Protein, ur: 30 mg/dL — AB
Specific Gravity, Urine: 1.017 (ref 1.005–1.030)
Squamous Epithelial / HPF: NONE SEEN /HPF (ref 0–5)
pH: 6 (ref 5.0–8.0)

## 2022-03-26 LAB — CBC WITH DIFFERENTIAL/PLATELET
Abs Immature Granulocytes: 0.05 10*3/uL (ref 0.00–0.07)
Basophils Absolute: 0 10*3/uL (ref 0.0–0.1)
Basophils Relative: 0 %
Eosinophils Absolute: 0.2 10*3/uL (ref 0.0–0.5)
Eosinophils Relative: 2 %
HCT: 41.1 % (ref 39.0–52.0)
Hemoglobin: 14.6 g/dL (ref 13.0–17.0)
Immature Granulocytes: 0 %
Lymphocytes Relative: 16 %
Lymphs Abs: 1.8 10*3/uL (ref 0.7–4.0)
MCH: 30.2 pg (ref 26.0–34.0)
MCHC: 35.5 g/dL (ref 30.0–36.0)
MCV: 85.1 fL (ref 80.0–100.0)
Monocytes Absolute: 1.4 10*3/uL — ABNORMAL HIGH (ref 0.1–1.0)
Monocytes Relative: 13 %
Neutro Abs: 7.7 10*3/uL (ref 1.7–7.7)
Neutrophils Relative %: 69 %
Platelets: 159 10*3/uL (ref 150–400)
RBC: 4.83 MIL/uL (ref 4.22–5.81)
RDW: 14.2 % (ref 11.5–15.5)
WBC: 11.1 10*3/uL — ABNORMAL HIGH (ref 4.0–10.5)
nRBC: 0 % (ref 0.0–0.2)

## 2022-03-26 LAB — PROTIME-INR
INR: 1.1 (ref 0.8–1.2)
Prothrombin Time: 14.3 seconds (ref 11.4–15.2)

## 2022-03-26 LAB — APTT: aPTT: 33 seconds (ref 24–36)

## 2022-03-26 LAB — MRSA NEXT GEN BY PCR, NASAL: MRSA by PCR Next Gen: NOT DETECTED

## 2022-03-26 LAB — BRAIN NATRIURETIC PEPTIDE: B Natriuretic Peptide: 108.3 pg/mL — ABNORMAL HIGH (ref 0.0–100.0)

## 2022-03-26 LAB — TROPONIN I (HIGH SENSITIVITY): Troponin I (High Sensitivity): 17 ng/L (ref <18)

## 2022-03-26 LAB — LIPASE, BLOOD: Lipase: 34 U/L (ref 11–51)

## 2022-03-26 LAB — MAGNESIUM: Magnesium: 2 mg/dL (ref 1.7–2.4)

## 2022-03-26 LAB — TSH: TSH: 2.787 u[IU]/mL (ref 0.350–4.500)

## 2022-03-26 SURGERY — PULMONARY THROMBECTOMY
Anesthesia: Moderate Sedation | Laterality: Right

## 2022-03-26 MED ORDER — SODIUM CHLORIDE 0.9 % IV SOLN
3.0000 g | Freq: Four times a day (QID) | INTRAVENOUS | Status: DC
  Administered 2022-03-26 – 2022-03-29 (×12): 3 g via INTRAVENOUS
  Filled 2022-03-26: qty 3
  Filled 2022-03-26 (×6): qty 8
  Filled 2022-03-26 (×4): qty 3
  Filled 2022-03-26 (×4): qty 8

## 2022-03-26 MED ORDER — TRAZODONE HCL 100 MG PO TABS
100.0000 mg | ORAL_TABLET | Freq: Every day | ORAL | Status: DC
  Administered 2022-03-26 – 2022-03-28 (×3): 100 mg via ORAL
  Filled 2022-03-26 (×3): qty 1

## 2022-03-26 MED ORDER — AMLODIPINE BESYLATE 5 MG PO TABS
2.5000 mg | ORAL_TABLET | Freq: Every day | ORAL | Status: DC
  Administered 2022-03-27 – 2022-03-29 (×3): 2.5 mg via ORAL
  Filled 2022-03-26 (×3): qty 1

## 2022-03-26 MED ORDER — ALTEPLASE 2 MG IJ SOLR
INTRAMUSCULAR | Status: AC
  Filled 2022-03-26: qty 6

## 2022-03-26 MED ORDER — FLUTICASONE FUROATE-VILANTEROL 100-25 MCG/ACT IN AEPB
1.0000 | INHALATION_SPRAY | Freq: Every day | RESPIRATORY_TRACT | Status: DC
  Administered 2022-03-28 – 2022-03-29 (×2): 1 via RESPIRATORY_TRACT
  Filled 2022-03-26: qty 28

## 2022-03-26 MED ORDER — HEPARIN SODIUM (PORCINE) 1000 UNIT/ML IJ SOLN
INTRAMUSCULAR | Status: AC
  Filled 2022-03-26: qty 10

## 2022-03-26 MED ORDER — MORPHINE SULFATE (PF) 4 MG/ML IV SOLN
4.0000 mg | Freq: Once | INTRAVENOUS | Status: AC
  Administered 2022-03-26: 4 mg via INTRAVENOUS
  Filled 2022-03-26: qty 1

## 2022-03-26 MED ORDER — OXYCODONE-ACETAMINOPHEN 5-325 MG PO TABS
1.0000 | ORAL_TABLET | ORAL | Status: DC | PRN
  Administered 2022-03-26 – 2022-03-29 (×10): 1 via ORAL
  Filled 2022-03-26 (×10): qty 1

## 2022-03-26 MED ORDER — MIDAZOLAM HCL 2 MG/ML PO SYRP: 8.0000 mg | ORAL_SOLUTION | Freq: Once | ORAL | Status: DC | PRN

## 2022-03-26 MED ORDER — GABAPENTIN 100 MG PO CAPS: 100.0000 mg | ORAL_CAPSULE | Freq: Every day | ORAL | Status: DC | PRN

## 2022-03-26 MED ORDER — FAMOTIDINE 20 MG PO TABS: 40.0000 mg | ORAL_TABLET | Freq: Once | ORAL | Status: DC | PRN

## 2022-03-26 MED ORDER — LORAZEPAM 2 MG/ML IJ SOLN: 2.0000 mg | INTRAMUSCULAR | Status: DC | PRN

## 2022-03-26 MED ORDER — CEFAZOLIN SODIUM-DEXTROSE 2-4 GM/100ML-% IV SOLN: 2.0000 g | INTRAVENOUS | Status: AC

## 2022-03-26 MED ORDER — FENTANYL CITRATE (PF) 100 MCG/2ML IJ SOLN
INTRAMUSCULAR | Status: DC | PRN
  Administered 2022-03-26: 50 ug via INTRAVENOUS

## 2022-03-26 MED ORDER — HEPARIN (PORCINE) 25000 UT/250ML-% IV SOLN
1800.0000 [IU]/h | INTRAVENOUS | Status: DC
  Administered 2022-03-26: 1450 [IU]/h via INTRAVENOUS
  Administered 2022-03-27: 1800 [IU]/h via INTRAVENOUS
  Administered 2022-03-27: 1600 [IU]/h via INTRAVENOUS
  Filled 2022-03-26 (×4): qty 250

## 2022-03-26 MED ORDER — ALTEPLASE 2 MG IJ SOLR
INTRAMUSCULAR | Status: DC | PRN
  Administered 2022-03-26: 6 mg

## 2022-03-26 MED ORDER — MIDAZOLAM HCL 2 MG/2ML IJ SOLN
INTRAMUSCULAR | Status: DC | PRN
  Administered 2022-03-26: 2 mg via INTRAVENOUS

## 2022-03-26 MED ORDER — MORPHINE SULFATE (PF) 2 MG/ML IV SOLN
2.0000 mg | Freq: Once | INTRAVENOUS | Status: AC
  Administered 2022-03-27: 2 mg via INTRAVENOUS
  Filled 2022-03-26: qty 1

## 2022-03-26 MED ORDER — PANTOPRAZOLE SODIUM 20 MG PO TBEC
20.0000 mg | DELAYED_RELEASE_TABLET | Freq: Every day | ORAL | Status: DC
  Administered 2022-03-27 – 2022-03-29 (×3): 20 mg via ORAL
  Filled 2022-03-26 (×3): qty 1

## 2022-03-26 MED ORDER — SODIUM CHLORIDE 1 G PO TABS
1.0000 g | ORAL_TABLET | Freq: Every day | ORAL | Status: DC
  Administered 2022-03-26 – 2022-03-28 (×3): 1 g via ORAL
  Filled 2022-03-26 (×4): qty 1

## 2022-03-26 MED ORDER — ACETAMINOPHEN 325 MG PO TABS: 650.0000 mg | ORAL_TABLET | Freq: Four times a day (QID) | ORAL | Status: DC | PRN

## 2022-03-26 MED ORDER — CEFAZOLIN SODIUM-DEXTROSE 2-4 GM/100ML-% IV SOLN
INTRAVENOUS | Status: AC
  Administered 2022-03-26: 2 g via INTRAVENOUS
  Filled 2022-03-26: qty 100

## 2022-03-26 MED ORDER — METOPROLOL SUCCINATE ER 50 MG PO TB24: 75.0000 mg | ORAL_TABLET | Freq: Every morning | ORAL | Status: DC

## 2022-03-26 MED ORDER — HYDROMORPHONE HCL 1 MG/ML IJ SOLN
0.5000 mg | INTRAMUSCULAR | Status: AC
  Administered 2022-03-26: 0.5 mg via INTRAVENOUS
  Filled 2022-03-26: qty 0.5

## 2022-03-26 MED ORDER — TRAZODONE HCL 50 MG PO TABS: 50.0000 mg | ORAL_TABLET | Freq: Every evening | ORAL | Status: DC | PRN

## 2022-03-26 MED ORDER — ONDANSETRON HCL 4 MG/2ML IJ SOLN: 4.0000 mg | Freq: Four times a day (QID) | INTRAMUSCULAR | Status: DC | PRN

## 2022-03-26 MED ORDER — FENTANYL CITRATE PF 50 MCG/ML IJ SOSY: 12.5000 ug | PREFILLED_SYRINGE | Freq: Once | INTRAMUSCULAR | Status: DC | PRN

## 2022-03-26 MED ORDER — MORPHINE SULFATE (PF) 4 MG/ML IV SOLN: 4.0000 mg | INTRAVENOUS | Status: DC | PRN

## 2022-03-26 MED ORDER — HYDRALAZINE HCL 20 MG/ML IJ SOLN: 5.0000 mg | INTRAMUSCULAR | Status: DC | PRN

## 2022-03-26 MED ORDER — DM-GUAIFENESIN ER 30-600 MG PO TB12
1.0000 | ORAL_TABLET | Freq: Two times a day (BID) | ORAL | Status: DC | PRN
  Administered 2022-03-27: 1 via ORAL
  Filled 2022-03-26: qty 1

## 2022-03-26 MED ORDER — POTASSIUM CHLORIDE IN NACL 40-0.9 MEQ/L-% IV SOLN
INTRAVENOUS | Status: DC
  Filled 2022-03-26: qty 1000

## 2022-03-26 MED ORDER — LAMOTRIGINE 25 MG PO TABS
100.0000 mg | ORAL_TABLET | Freq: Every day | ORAL | Status: DC
  Administered 2022-03-26 – 2022-03-27 (×2): 100 mg via ORAL
  Filled 2022-03-26 (×2): qty 4

## 2022-03-26 MED ORDER — IOHEXOL 350 MG/ML SOLN
100.0000 mL | Freq: Once | INTRAVENOUS | Status: AC | PRN
  Administered 2022-03-26: 100 mL via INTRAVENOUS

## 2022-03-26 MED ORDER — SODIUM CHLORIDE 0.9 % IV SOLN: INTRAVENOUS | Status: DC

## 2022-03-26 MED ORDER — MAGNESIUM OXIDE -MG SUPPLEMENT 400 (240 MG) MG PO TABS
400.0000 mg | ORAL_TABLET | Freq: Every day | ORAL | Status: DC
  Administered 2022-03-27 – 2022-03-29 (×3): 400 mg via ORAL
  Filled 2022-03-26 (×3): qty 1

## 2022-03-26 MED ORDER — POTASSIUM CHLORIDE IN NACL 40-0.9 MEQ/L-% IV SOLN: INTRAVENOUS | Status: AC

## 2022-03-26 MED ORDER — VENLAFAXINE HCL ER 75 MG PO CP24
75.0000 mg | ORAL_CAPSULE | Freq: Every day | ORAL | Status: DC
  Administered 2022-03-27 – 2022-03-29 (×3): 75 mg via ORAL
  Filled 2022-03-26 (×3): qty 1

## 2022-03-26 MED ORDER — FENTANYL CITRATE (PF) 100 MCG/2ML IJ SOLN
INTRAMUSCULAR | Status: AC
  Filled 2022-03-26: qty 2

## 2022-03-26 MED ORDER — ALBUTEROL SULFATE (2.5 MG/3ML) 0.083% IN NEBU
2.5000 mg | INHALATION_SOLUTION | RESPIRATORY_TRACT | Status: DC | PRN
  Administered 2022-03-27: 2.5 mg via RESPIRATORY_TRACT
  Filled 2022-03-26: qty 3

## 2022-03-26 MED ORDER — NITROGLYCERIN 0.4 MG SL SUBL: 0.4000 mg | SUBLINGUAL_TABLET | SUBLINGUAL | Status: DC | PRN

## 2022-03-26 MED ORDER — FUROSEMIDE 20 MG PO TABS
40.0000 mg | ORAL_TABLET | Freq: Every day | ORAL | Status: DC
  Administered 2022-03-26 – 2022-03-27 (×2): 40 mg via ORAL
  Filled 2022-03-26 (×2): qty 2

## 2022-03-26 MED ORDER — POTASSIUM CHLORIDE 10 MEQ/100ML IV SOLN: 10.0000 meq | INTRAVENOUS | Status: DC

## 2022-03-26 MED ORDER — MIDAZOLAM HCL 5 MG/5ML IJ SOLN
INTRAMUSCULAR | Status: AC
  Filled 2022-03-26: qty 5

## 2022-03-26 MED ORDER — DIPHENHYDRAMINE HCL 50 MG/ML IJ SOLN: 50.0000 mg | Freq: Once | INTRAMUSCULAR | Status: DC | PRN

## 2022-03-26 MED ORDER — ASPIRIN 81 MG PO TBEC
81.0000 mg | DELAYED_RELEASE_TABLET | Freq: Every day | ORAL | Status: DC
  Administered 2022-03-26 – 2022-03-29 (×4): 81 mg via ORAL
  Filled 2022-03-26 (×4): qty 1

## 2022-03-26 MED ORDER — CHLORHEXIDINE GLUCONATE CLOTH 2 % EX PADS
6.0000 | MEDICATED_PAD | Freq: Every day | CUTANEOUS | Status: DC
  Administered 2022-03-26 – 2022-03-28 (×3): 6 via TOPICAL

## 2022-03-26 MED ORDER — HYDROMORPHONE HCL 1 MG/ML IJ SOLN: 1.0000 mg | Freq: Once | INTRAMUSCULAR | Status: DC | PRN

## 2022-03-26 MED ORDER — HEPARIN BOLUS VIA INFUSION
6000.0000 [IU] | Freq: Once | INTRAVENOUS | Status: AC
  Administered 2022-03-26: 6000 [IU] via INTRAVENOUS
  Filled 2022-03-26: qty 6000

## 2022-03-26 MED ORDER — MONTELUKAST SODIUM 10 MG PO TABS
10.0000 mg | ORAL_TABLET | Freq: Every day | ORAL | Status: DC
  Administered 2022-03-26 – 2022-03-28 (×3): 10 mg via ORAL
  Filled 2022-03-26 (×3): qty 1

## 2022-03-26 MED ORDER — MORPHINE SULFATE (PF) 4 MG/ML IV SOLN
1.0000 mg | INTRAVENOUS | Status: DC | PRN
  Administered 2022-03-26 (×2): 1 mg via INTRAVENOUS
  Filled 2022-03-26 (×2): qty 1

## 2022-03-26 MED ORDER — POTASSIUM CHLORIDE CRYS ER 20 MEQ PO TBCR
40.0000 meq | EXTENDED_RELEASE_TABLET | Freq: Once | ORAL | Status: AC
  Administered 2022-03-26: 40 meq via ORAL
  Filled 2022-03-26: qty 2

## 2022-03-26 MED ORDER — METHYLPREDNISOLONE SODIUM SUCC 125 MG IJ SOLR: 125.0000 mg | Freq: Once | INTRAMUSCULAR | Status: DC | PRN

## 2022-03-26 MED ORDER — HYDROXYZINE HCL 25 MG PO TABS: 25.0000 mg | ORAL_TABLET | Freq: Every day | ORAL | Status: DC | PRN

## 2022-03-26 MED ORDER — ONDANSETRON HCL 4 MG/2ML IJ SOLN
4.0000 mg | Freq: Four times a day (QID) | INTRAMUSCULAR | Status: DC | PRN
  Administered 2022-03-26: 4 mg via INTRAVENOUS
  Filled 2022-03-26: qty 2

## 2022-03-26 MED ORDER — PIPERACILLIN-TAZOBACTAM 3.375 G IVPB
3.3750 g | Freq: Once | INTRAVENOUS | Status: AC
  Administered 2022-03-26: 3.375 g via INTRAVENOUS
  Filled 2022-03-26: qty 50

## 2022-03-26 SURGICAL SUPPLY — 19 items
CANISTER PENUMBRA ENGINE (MISCELLANEOUS) IMPLANT
CATH ANGIO 5F PIGTAIL 100CM (CATHETERS) IMPLANT
CATH INDIGO 12XTORQ 100 (CATHETERS) IMPLANT
CATH INDIGO SEP 12 (CATHETERS) IMPLANT
CATH INFINITI JR4 5F (CATHETERS) IMPLANT
CLOSURE PERCLOSE PROSTYLE (VASCULAR PRODUCTS) IMPLANT
COVER DRAPE FLUORO 36X44 (DRAPES) IMPLANT
COVER PROBE ULTRASOUND 5X96 (MISCELLANEOUS) IMPLANT
DEVICE SAFEGUARD 24CM (GAUZE/BANDAGES/DRESSINGS) IMPLANT
GLIDEWIRE ADV .035X180CM (WIRE) IMPLANT
KIT FEM OPTION ELITE FILTER (Filter) IMPLANT
PACK ANGIOGRAPHY (CUSTOM PROCEDURE TRAY) ×2 IMPLANT
SHEATH BRITE TIP 6FRX11 (SHEATH) IMPLANT
SHEATH PINNACLE 11FRX10 (SHEATH) IMPLANT
SUT MNCRL AB 4-0 PS2 18 (SUTURE) IMPLANT
SYR MEDRAD MARK 7 150ML (SYRINGE) IMPLANT
TUBING CONTRAST HIGH PRESS 72 (TUBING) IMPLANT
WIRE GUIDERIGHT .035X150 (WIRE) IMPLANT
WIRE SUPRACORE 300CM (MISCELLANEOUS) IMPLANT

## 2022-03-26 NOTE — ED Notes (Addendum)
Dr. Cheri Fowler notified of K+ of 2.6

## 2022-03-26 NOTE — Consult Note (Signed)
ANTICOAGULATION CONSULT NOTE - Initial Consult  Pharmacy Consult for heparin infusion Indication: pulmonary embolus  Allergies  Allergen Reactions   Fentanyl Other (See Comments)    Possible involuntary tremors Other reaction(s): Other (See Comments) Involuntary tremors. Patient said this was looked into and it was not the cause of the tremors   Methocarbamol Other (See Comments)    confusion    Patient Measurements: Height: '5\' 8"'$  (172.7 cm) Weight: 88.5 kg (195 lb) IBW/kg (Calculated) : 68.4 Heparin Dosing Weight: 86.4 kg   Vital Signs: Temp: 99.2 F (37.3 C) (01/25 0814) BP: 131/98 (01/25 0814) Pulse Rate: 48 (01/25 0814)  Labs: Recent Labs    03/26/22 0816 03/26/22 0820  HGB 14.6  --   HCT 41.1  --   PLT 159  --   CREATININE 1.06  --   TROPONINIHS  --  17    Estimated Creatinine Clearance: 65.1 mL/min (by C-G formula based on SCr of 1.06 mg/dL).   Medical History: Past Medical History:  Diagnosis Date   (HFpEF) heart failure with preserved ejection fraction ([place]) 02/27/2014   a.) TTE 02/27/14: EF >55%; mod LVH; mild panvalvular regurgitation; (+) global stress induced HK. b.) TTE 09/20/14: EF 55%; triv AR/MR, mild TR/PR; mild AS; BAE; c.) TTE 06/20/15: EF 55%, triv AR/MR; BAE; mild AS. d.) TTE 02/11/17: EF 55; significant  RV dilation with mod systolic dysfunction; mild AS. e.) TTE 02/27/19: EF 55%; mild AS. f.) TTE 06/04/20: EF 50%; mild AS   Anxiety    Aortic stenosis 09/20/2014   a.) TTE 09/20/2014: mild; MPG?. b.) TTE 06/20/2015: mild; MPG 8.0 mmHg. c.) TTE 02/11/2017: mild; MPG 12.4 mmHg. d.) TTE 02/27/2019: mild; MPG 11.4 mmHg. e.) TTE 06/04/2020: mild; MPG 9.6 mmHg.   Arthritis    Asthma    CAD (coronary artery disease)    Cataracts, bilateral    Chronic back pain    Chronic venous insufficiency    Complication of anesthesia    a.) postoperative amnesia (1999). b.) seizures after partial knee in 2014; sent to [facility] x 5 days d/t uncontrollable shaking  and AMS; ?? fentanyl induced tremors. c.) anesthesia awareness   COVID-19 05/2018   CVA (cerebral vascular accident) [facility])    Depressed    Essential tremor    GERD (gastroesophageal reflux disease)    Hepatitis B    a.) h/o in the 90s; reports not active at present   History of bronchitis    Jan-Mar 2017   History of hiatal hernia    Hyperlipidemia    Hypertension    Insomnia    Joint pain    Lymphedema    Mild cognitive impairment    Muscle spasm    in neck.Takes Flexeril as needed   Numbness    right knee,right hand,and toes on left   RBBB    Seizures (HCC)    Petit Mal. takes Lamotrigine daily. Last seizure 6+ yrs ago   Sleep apnea with use of continuous positive airway pressure (CPAP)    VTE (venous thromboembolism)    a.) s/p RIGHT TKA    Medications:  No prior anticoagulation noted   Assessment: 77 y.o. male history of coronary disease,  stroke, multiple surgeries, previous pulmonary embolism and DVTs presented to [facility] ED 03/26/22 with rib pain and dizziness. Found to have acute PE with right heart strain. Pharmacy consulted to initiate and manage IV heparin infusion.   Goal of Therapy:  Heparin level 0.3-0.7 units/ml Monitor platelets by anticoagulation protocol: Yes  Plan:  Give 6000 units bolus x 1 Start heparin infusion at 1450 units/hr Check anti-Xa level in 8 hours Continue to monitor H&H and platelets  Dorothe Pea, PharmD, BCPS Clinical Pharmacist   03/26/2022,11:47 AM

## 2022-03-26 NOTE — ED Provider Notes (Signed)
[employer] Provider Note   Event Date/Time   First MD Initiated Contact with Patient 03/26/22 0935     (approximate)  History   Flank Pain and Dizziness  HPI  Johnny Mccoy is a 77 y.o. 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  No falls or injuries but he reports the pain feels like he is got a rib fracture or something.  It is quite severe to the point that it limits his ability to even eat or drink because it so painful along his right rib.  He reports any type of breathing or taking a deep breath is extremely painful.  He does not feel short of breath however.  He does not have any associate abdominal pain but the pain sort of starts along the edge of his ribs at the top of his abdomen and shoots towards his back.  No skin rash     Physical Exam   Triage Vital Signs: ED Triage Vitals  Enc Vitals Group     BP 03/26/22 0814 (!) 131/98     Pulse Rate 03/26/22 0814 (!) 48     Resp 03/26/22 0814 18     Temp 03/26/22 0814 99.2 F (37.3 C)     Temp src --      SpO2 03/26/22 0814 95 %     Weight 03/26/22 0818 195 lb (88.5 kg)     Height 03/26/22 0818 '5\' 8"'$  (1.727 m)     Head Circumference --      Peak Flow --      Pain Score 03/26/22 0820 9     Pain Loc --      Pain Edu? --      Excl. in [place]? --     Most recent vital signs: Vitals:   03/26/22 0814  BP: (!) 131/98  Pulse: (!) 48  Resp: 18  Temp: 99.2 F (37.3 C)  SpO2: 95%     General: Awake, no distress.  Very pleasant.  Seated in wheelchair.  Obvious pain when he tries to move about or take a deep breath he splints CV:  Good peripheral perfusion.  Normal tones and rate Resp:  Normal effort.  Clear bilaterally but unable to take a deep breath due to severity of pain with  deep inspiration over his right lower chest wall Abd:  No distention.  Skin and surrounding areas over the right lower chest wall normal.  Denies pain to palpation in the abdominal quadrants Other:  Fully alert and oriented warm well-perfused extremities   ED Results / Procedures / Treatments   Labs (all labs ordered are listed, but only abnormal results are displayed) Labs Reviewed  URINALYSIS, ROUTINE W REFLEX MICROSCOPIC - Abnormal; Notable for the following components:      Result Value   Color, Urine AMBER (*)    APPearance HAZY (*)    Ketones, ur 20 (*)    Protein, ur 30 (*)    All other components within normal limits  COMPREHENSIVE METABOLIC PANEL - Abnormal; Notable for the following components:   Sodium 131 (*)    Potassium 2.6 (*)    Chloride 91 (*)    Glucose, Bld 124 (*)    Calcium 8.7 (*)    Total Bilirubin 1.5 (*)  All other components within normal limits  CBC WITH DIFFERENTIAL/PLATELET - Abnormal; Notable for the following components:   WBC 11.1 (*)    Monocytes Absolute 1.4 (*)    All other components within normal limits  MAGNESIUM  LIPASE, BLOOD  PROTIME-INR  APTT  TROPONIN I (HIGH SENSITIVITY)     EKG  And interpreted by me at 840 Heart rate 50 QRS 160 QTc 430 Sinus bradycardia, right bundle branch block.  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  There is no obvious new acute ischemic change.   RADIOLOGY  CT Abdomen Pelvis W Contrast  Result Date: 03/26/2022 CLINICAL DATA:  Pulmonary embolism (PE) suspected, high prob R sided pleurisy; RLQ abdominal pain EXAM: CT ANGIOGRAPHY CHEST CT ABDOMEN AND PELVIS WITH CONTRAST TECHNIQUE: Multidetector CT imaging of the chest was performed using the standard protocol during bolus administration of intravenous contrast. Multiplanar CT image reconstructions and MIPs were obtained to evaluate the vascular anatomy. Multidetector CT imaging of the abdomen and pelvis was performed  using the standard protocol during bolus administration of intravenous contrast. RADIATION DOSE REDUCTION: This exam was performed according to the departmental dose-optimization program which includes automated exposure control, adjustment of the mA and/or kV according to patient size and/or use of iterative reconstruction technique. CONTRAST:  176m OMNIPAQUE IOHEXOL 350 MG/ML SOLN COMPARISON:  Chest CT 05/29/2021 FINDINGS: CTA CHEST FINDINGS Cardiovascular: Satisfactory opacification of the pulmonary arteries to the segmental level. Central filling defect in the distal right pulmonary artery extending into the lobar arteries, segmental branches of the right upper and lower lobes, and subsegmental branches of the right lower lobe. RV:LV ratio measures 1.2.No pericardial disease. Coronary artery atherosclerosis. Mitral annular and aortic valve calcifications. Scattered atherosclerosis of the thoracic aorta. Mediastinum/Nodes: No lymphadenopathy. The thyroid is unremarkable. Lungs/Pleura: There is diffuse bronchial wall thickening, lower lung predominant. There is interlobular septal thickening and ground-glass opacities bilaterally with small right pleural effusion and bibasilar atelectasis. There is peribronchovascular consolidation in the right lower lobe (series 6, image 225). There are multiple pulmonary nodules as seen on multiple prior chest CTs, some of which are obscured by acute pulmonary disease. Nodules in the right lower, right middle, and right upper lobe measure up to 4-5 mm (series 6, images 205, 191, and 145 respectively). Musculoskeletal: No acute osseous abnormality. No suspicious osseous lesion. Review of the MIP images confirms the above findings. CT ABDOMEN and PELVIS FINDINGS Hepatobiliary: No focal liver abnormality is seen. The gallbladder is unremarkable. Pancreas: Unremarkable. No pancreatic ductal dilatation or surrounding inflammatory changes. Spleen: Normal in size without focal  abnormality. Adrenals/Urinary Tract: Adrenal glands are unremarkable. No hydronephrosis or nephrolithiasis. The bladder is moderately distended. Stomach/Bowel: The stomach is within normal limits. There is no evidence of bowel obstruction.The appendix is normal. Vascular/Lymphatic: Aortoiliac atherosclerosis. No AAA. No lymphadenopathy. Reproductive: Mildly enlarged prostate gland. Other: Bilateral fat containing inguinal hernias, right greater than left. Musculoskeletal: No acute osseous abnormality. No suspicious osseous lesion. Prior lumbosacral and right SI joint fusion without evidence of hardware complication. Review of the MIP images confirms the above findings. IMPRESSION: Positive for acute pulmonary emboli in the distal right pulmonary artery, extending into the lobar, segmental, and subsegmental branches, and with CT evidence of right heart strain. Pulmonary edema with small right pleural effusion and bibasilar atelectasis. Additional peribronchovascular consolidation in the right lower lung with adjacent endobronchial material in the right lower lobe bronchi, potentially pneumonia and/or aspiration. Multiple pulmonary nodules as seen on prior exams, some of  which are obscured by current pulmonary disease, those which are visible measuring 4-5 mm and unchanged from prior exam in March 2023. Recommend follow-up chest CT after resolution of acute illness for adequate comparison. No acute findings in the abdomen or pelvis. Critical Value/emergent results were called by telephone at the time of interpretation on 03/26/2022 at 11:28 am to provider [facility], who verbally acknowledged these results. Electronically Signed   By: Maurine Simmering M.D.   On: 03/26/2022 11:39   CT Angio Chest PE W and/or Wo Contrast  Result Date: 03/26/2022 CLINICAL DATA:  Pulmonary embolism (PE) suspected, high prob R sided pleurisy; RLQ abdominal pain EXAM: CT ANGIOGRAPHY CHEST CT ABDOMEN AND PELVIS WITH CONTRAST TECHNIQUE:  Multidetector CT imaging of the chest was performed using the standard protocol during bolus administration of intravenous contrast. Multiplanar CT image reconstructions and MIPs were obtained to evaluate the vascular anatomy. Multidetector CT imaging of the abdomen and pelvis was performed using the standard protocol during bolus administration of intravenous contrast. RADIATION DOSE REDUCTION: This exam was performed according to the departmental dose-optimization program which includes automated exposure control, adjustment of the mA and/or kV according to patient size and/or use of iterative reconstruction technique. CONTRAST:  175m OMNIPAQUE IOHEXOL 350 MG/ML SOLN COMPARISON:  Chest CT 05/29/2021 FINDINGS: CTA CHEST FINDINGS Cardiovascular: Satisfactory opacification of the pulmonary arteries to the segmental level. Central filling defect in the distal right pulmonary artery extending into the lobar arteries, segmental branches of the right upper and lower lobes, and subsegmental branches of the right lower lobe. RV:LV ratio measures 1.2.No pericardial disease. Coronary artery atherosclerosis. Mitral annular and aortic valve calcifications. Scattered atherosclerosis of the thoracic aorta. Mediastinum/Nodes: No lymphadenopathy. The thyroid is unremarkable. Lungs/Pleura: There is diffuse bronchial wall thickening, lower lung predominant. There is interlobular septal thickening and ground-glass opacities bilaterally with small right pleural effusion and bibasilar atelectasis. There is peribronchovascular consolidation in the right lower lobe (series 6, image 225). There are multiple pulmonary nodules as seen on multiple prior chest CTs, some of which are obscured by acute pulmonary disease. Nodules in the right lower, right middle, and right upper lobe measure up to 4-5 mm (series 6, images 205, 191, and 145 respectively). Musculoskeletal: No acute osseous abnormality. No suspicious osseous lesion. Review of the  MIP images confirms the above findings. CT ABDOMEN and PELVIS FINDINGS Hepatobiliary: No focal liver abnormality is seen. The gallbladder is unremarkable. Pancreas: Unremarkable. No pancreatic ductal dilatation or surrounding inflammatory changes. Spleen: Normal in size without focal abnormality. Adrenals/Urinary Tract: Adrenal glands are unremarkable. No hydronephrosis or nephrolithiasis. The bladder is moderately distended. Stomach/Bowel: The stomach is within normal limits. There is no evidence of bowel obstruction.The appendix is normal. Vascular/Lymphatic: Aortoiliac atherosclerosis. No AAA. No lymphadenopathy. Reproductive: Mildly enlarged prostate gland. Other: Bilateral fat containing inguinal hernias, right greater than left. Musculoskeletal: No acute osseous abnormality. No suspicious osseous lesion. Prior lumbosacral and right SI joint fusion without evidence of hardware complication. Review of the MIP images confirms the above findings. IMPRESSION: Positive for acute pulmonary emboli in the distal right pulmonary artery, extending into the lobar, segmental, and subsegmental branches, and with CT evidence of right heart strain. Pulmonary edema with small right pleural effusion and bibasilar atelectasis. Additional peribronchovascular consolidation in the right lower lung with adjacent endobronchial material in the right lower lobe bronchi, potentially pneumonia and/or aspiration. Multiple pulmonary nodules as seen on prior exams, some of which are obscured by current pulmonary disease, those which are visible measuring 4-5 mm  and unchanged from prior exam in March 2023. Recommend follow-up chest CT after resolution of acute illness for adequate comparison. No acute findings in the abdomen or pelvis. Critical Value/emergent results were called by telephone at the time of interpretation on 03/26/2022 at 11:28 am to provider [facility], who verbally acknowledged these results. Electronically Signed   By:  Maurine Simmering M.D.   On: 03/26/2022 11:39       PROCEDURES:  Critical Care performed: Yes, see critical care procedure note(s)  CRITICAL CARE Performed by: Delman Kitten   Total critical care time: 40 minutes  Critical care time was exclusive of separately billable procedures and treating other patients.  Critical care was necessary to treat or prevent imminent or life-threatening deterioration.  Critical care was time spent personally by me on the following activities: development of treatment plan with patient and/or surrogate as well as nursing, discussions with consultants, evaluation of patient's response to treatment, examination of patient, obtaining history from patient or surrogate, ordering and performing treatments and interventions, ordering and review of laboratory studies, ordering and review of radiographic studies, pulse oximetry and re-evaluation of patient's condition.    Procedures   MEDICATIONS ORDERED IN ED: Medications  ondansetron (ZOFRAN) injection 4 mg (4 mg Intravenous Given 03/26/22 1044)  0.9 % NaCl with KCl 40 mEq / L  infusion ( Intravenous New Bag/Given 03/26/22 1050)  heparin bolus via infusion 6,000 Units (has no administration in time range)    Followed by  heparin ADULT infusion 100 units/mL (25000 units/233m) (has no administration in time range)  morphine (PF) 2 MG/ML injection 2 mg (has no administration in time range)  morphine (PF) 4 MG/ML injection 4 mg (4 mg Intravenous Given 03/26/22 1045)  iohexol (OMNIPAQUE) 350 MG/ML injection 100 mL (100 mLs Intravenous Contrast Given 03/26/22 1100)  morphine (PF) 4 MG/ML injection 4 mg (4 mg Intravenous Given 03/26/22 1125)     IMPRESSION / MDM / ASSESSMENT AND PLAN / ED COURSE  I reviewed the triage vital signs and the nursing notes.                              Differential diagnosis includes, but is not limited to, ACS, pulmonary embolism, pneumonia aspiration pleural effusion empyema right upper  quadrant pathology intra-abdominal pathology etc.  Based on the patient's clinical history of multiple PEs DVTs in the past with pleuritic pain I am highly concerned about the possibility of pulmonary embolism and will proceed with imaging of the chest as well as upper abdomen as we evaluate for pathology  Patient's presentation is most consistent with acute presentation with potential threat to life or bodily function.   The patient is on the cardiac monitor to evaluate for evidence of arrhythmia and/or significant heart rate changes.  Clinical Course as of 03/26/22 1208  Thu Mar 26, 2022  1144 BGaspar Biddingpace from vascular surgery team currently consulting.  He is currently in the room with the patient. [MQ]    Clinical Course User Index [MQ] QDelman Kitten MD   Labs reviewed movement markedly reduced potassium.  He does take Lasix.  Will begin to replete here.  In addition he has a possible heart block which I have discussed with our cardiologist Dr. PChristella Noawho agrees that is somewhat hard to delineate but possibly a 2-1 block from atria to ventricle.  He does have mild bradycardia but maintains his normal right bundle branch block morphology.  Patient is hemodynamically stable with regard to blood pressure but some bradycardia.  ----------------------------------------- 12:07 PM on 03/26/2022 ----------------------------------------- Awaiting final word from vascular surgery team but they are evaluating for possible need for emergent operative management of  PE.  I have discussed stat heparin order with our pharmacist as well to initiate this as soon as possible.  May be some element of aspiration pneumonia or infarction, patient does not have obvious infectious symptomatology at this time.  FINAL CLINICAL IMPRESSION(S) / ED DIAGNOSES   Final diagnoses:  Heart block  Acute pulmonary embolism with acute cor pulmonale, unspecified pulmonary embolism type (HCC)  Aspiration pneumonia,  unspecified aspiration pneumonia type, unspecified laterality, unspecified part of lung [facility])   Vascular surgery notified me that they requested I speak to the hospitalist for admission.  Patient has left the ED to the OR, but I have consulted with and patient has been accepted admission by Dr. Blaine Hamper  Rx / DC Orders   ED Discharge Orders     None        Note:  This document was prepared using Dragon voice recognition software and may include unintentional dictation errors.   Delman Kitten, MD 03/26/22 1500

## 2022-03-26 NOTE — H&P (Signed)
History and Physical    Johnny Mccoy FBP:[id number] DOB: [birth year] DOA: 03/26/2022  Referring MD/NP/PA:   PCP: Idelle Crouch, MD   Patient coming from:  The patient is coming from home.    Chief Complaint: right flank and right lower rib cage pain  HPI: Johnny Mccoy is a 77 y.o. 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  Patient states that he started having pain in the right flank and right lower rib cage area at one and half days ago. The pain is is constant, sharp, moderate to severe, nonradiating, pleuritic, aggravated by deep breath.  It is associated with lightheadedness and dizziness.  Patient has a shortness breath, no cough, fever or chills.  Denies nausea vomiting, diarrhea or abdominal pain.  No symptoms of UTI.  Denies recent fall or head injury.  No rectal bleeding or dark stool.   Data reviewed independently and ED Course: pt was found to have WBC 11.1, trop 17, BNP 108, potassium 2.6, magnesium 2.0, phosphorus 2.6, GFR> 60, INR 1.1, PTT 33, temperature 99.2, blood pressure 125/52, heart rate 42 --> 81, RR 18, oxygen saturation 88% initially, which improved to 94% on room air currently.  CT abdomen/pelvis is negative for acute intra abdominal issues.  CTA showed acute PE with a CT evidence of right heart strain, possible aspiration to left lower lobe and pulmonary nodules. Pt is admitted to SDU as inpt. Dr. Lucky Cowboy of [facility] is consulted.  CTA: Positive for acute pulmonary emboli in the distal right pulmonary artery, extending into the lobar, segmental, and subsegmental branches, and with CT evidence of right heart strain.   Pulmonary edema with small right  pleural effusion and bibasilar atelectasis. Additional peribronchovascular consolidation in the right lower lung with adjacent endobronchial material in the right lower lobe bronchi, potentially pneumonia and/or aspiration.   Multiple pulmonary nodules as seen on prior exams, some of which are obscured by current pulmonary disease, those which are visible measuring 4-5 mm and unchanged from prior exam in March 2023. Recommend follow-up chest CT after resolution of acute illness for adequate comparison.   No acute findings in the abdomen or pelvis.   EKG: I have personally reviewed.  Sinus rhythm, QTc 421, bifascicular block, seems to have U wave, HR 47   Review of Systems:   General: no fevers, chills, no body weight gain, fatigue HEENT: no blurry vision, hearing changes or sore throat Respiratory: no dyspnea, coughing, wheezing CV: has right lower rib cage pain, no palpitations GI: no nausea, vomiting, abdominal pain, diarrhea, constipation GU: no dysuria, burning on urination, increased urinary frequency, hematuria  Ext: no leg edema Neuro: no unilateral weakness, numbness, or tingling, no vision change or hearing loss.  Has dizziness and lightheadedness. Skin: no rash, no skin tear. MSK: No muscle spasm, no deformity, no limitation of range of movement in spin Heme: No easy bruising.  Travel history: No recent long distant travel.   Allergy:  Allergies  Allergen Reactions   Fentanyl Other (See Comments)    Possible involuntary tremors Other reaction(s): Other (See Comments) Involuntary tremors. Patient said this was looked into and it was not the cause of the tremors   Methocarbamol Other (See Comments)    confusion  Past Medical History:  Diagnosis Date   (HFpEF) heart failure with preserved ejection fraction ([place]) 02/27/2014   a.) TTE 02/27/14: EF >55%; mod LVH; mild panvalvular regurgitation; (+) global stress induced HK. b.) TTE 09/20/14: EF 55%; triv AR/MR,  mild TR/PR; mild AS; BAE; c.) TTE 06/20/15: EF 55%, triv AR/MR; BAE; mild AS. d.) TTE 02/11/17: EF 55; significant  RV dilation with mod systolic dysfunction; mild AS. e.) TTE 02/27/19: EF 55%; mild AS. f.) TTE 06/04/20: EF 50%; mild AS   Anxiety    Aortic stenosis 09/20/2014   a.) TTE 09/20/2014: mild; MPG?. b.) TTE 06/20/2015: mild; MPG 8.0 mmHg. c.) TTE 02/11/2017: mild; MPG 12.4 mmHg. d.) TTE 02/27/2019: mild; MPG 11.4 mmHg. e.) TTE 06/04/2020: mild; MPG 9.6 mmHg.   Arthritis    Asthma    CAD (coronary artery disease)    Cataracts, bilateral    Chronic back pain    Chronic venous insufficiency    Complication of anesthesia    a.) postoperative amnesia (1999). b.) seizures after partial knee in 2014; sent to [facility] x 5 days d/t uncontrollable shaking and AMS; ?? fentanyl induced tremors. c.) anesthesia awareness   COVID-19 05/2018   CVA (cerebral vascular accident) [facility])    Depressed    Essential tremor    GERD (gastroesophageal reflux disease)    Hepatitis B    a.) h/o in the 90s; reports not active at present   History of bronchitis    Jan-Mar 2017   History of hiatal hernia    Hyperlipidemia    Hypertension    Insomnia    Joint pain    Lymphedema    Mild cognitive impairment    Muscle spasm    in neck.Takes Flexeril as needed   Numbness    right knee,right hand,and toes on left   RBBB    Seizures (HCC)    [place]. takes Lamotrigine daily. Last seizure 6+ yrs ago   Sleep apnea with use of continuous positive airway pressure (CPAP)    VTE (venous thromboembolism)    a.) s/p RIGHT TKA    Past Surgical History:  Procedure Laterality Date   ANKLE ARTHROSCOPY Left 03/07/2020   Procedure: ANKLE ARTHROSCOPY;  Surgeon: Caroline More, DPM;  Location: [place];  Service: Podiatry;  Laterality: Left;   BACK SURGERY     x3   BLEPHAROPLASTY     CARDIAC CATHETERIZATION     2015   CARPAL TUNNEL RELEASE     COLONOSCOPY     CORONARY ANGIOPLASTY WITH STENT PLACEMENT      x 3   CORONARY ANGIOPLASTY WITH STENT PLACEMENT Left 01/30/2003   Procedure: CORONARY ANGIOPLASTY WITH STENT PLACEMENT (2.75 x 32 mm Taxus stent to RCA); Location: [facility]; Surgeon: Courtney Paris, MD   dental implants     ESOPHAGOGASTRODUODENOSCOPY     EYE SURGERY     both eyes in 60's d/t muscle causing to squint   IVC FILTER INSERTION N/A 03/04/2021   Procedure: IVC FILTER INSERTION;  Surgeon: Katha Cabal, MD;  Location: [place] [facility];  Service: Cardiovascular;  Laterality: N/A;   IVC FILTER INSERTION N/A 03/26/2022   Procedure: IVC FILTER INSERTION;  Surgeon: Algernon Huxley, MD;  Location: [place] [facility];  Service: Cardiovascular;  Laterality: N/A;   IVC FILTER REMOVAL N/A 05/27/2021   Procedure: IVC FILTER REMOVAL;  Surgeon: Algernon Huxley, MD;  Location: [place] [facility];  Service: Cardiovascular;  Laterality: N/A;   JOINT REPLACEMENT  Right    knee   KNEE ARTHROSCOPY     LEFT HEART CATH AND CORONARY ANGIOGRAPHY Left 04/12/2019   Procedure: LEFT HEART CATH AND CORONARY ANGIOGRAPHY;  Surgeon: Corey Skains, MD;  Location: [place] [facility];  Service: Cardiovascular;  Laterality: Left;   MEDIAL PARTIAL KNEE REPLACEMENT Right    MEDIASTERNOTOMY N/A 10/28/2015   Procedure: PARTIAL STERNOTOMY;  Surgeon: [name], MD;  Location: [place];  Service: Thoracic;  Laterality: N/A;   numerous hand surgery     ORIF ANKLE FRACTURE Left 03/07/2020   Procedure: OPEN REDUCTION INTERNAL FIXATION (ORIF) ANKLE FRACTURE;  Surgeon: Caroline More, DPM;  Location: [place];  Service: Podiatry;  Laterality: Left;   PULMONARY THROMBECTOMY Right 03/26/2022   Procedure: PULMONARY THROMBECTOMY;  Surgeon: Algernon Huxley, MD;  Location: [place] [facility];  Service: Cardiovascular;  Laterality: Right;   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   Procedure: RESECTION OF anterier MEDIASTINAL TUMOR;  Surgeon: [name], MD;  Location: [place];  Service: Thoracic;   Laterality: N/A;   right hand fusion     rotorblator  03/1997   anteriograde amnesia resulted   TENDON REPAIR Left 03/07/2020   Procedure: FLEXOR TENDON REPAIR;  Surgeon: Caroline More, DPM;  Location: [place];  Service: Podiatry;  Laterality: Left;  sleep apnea   TONSILLECTOMY     TOTAL KNEE REVISION Right 03/10/2021   Procedure: TOTAL KNEE REVISION;  Surgeon: Dereck Leep, MD;  Location: [facility];  Service: Orthopedics;  Laterality: Right;   UMBILICAL HERNIA REPAIR      Social History:  reports that he has never smoked. He has never used smokeless tobacco. He reports current alcohol use of about 14.0 standard drinks of alcohol per week. He reports that he does not use drugs.  Family History:  Family History  Problem Relation Age of Onset   Cancer Father 58       Bone    COPD Father    Heart disease Mother    Hyperlipidemia Mother    Stroke Mother    Heart disease Maternal Grandfather    Hyperlipidemia Maternal Grandfather      Prior to Admission medications   Medication Sig Start Date End Date Taking? Authorizing Provider  albuterol (PROVENTIL) (2.5 MG/3ML) 0.083% nebulizer solution Take 2.5 mg by nebulization 2 (two) times a week.    [provider]  albuterol (VENTOLIN HFA) 108 (90 Base) MCG/ACT inhaler Inhale 2 puffs into the lungs every 4 (four) hours as needed for wheezing or shortness of breath.    [provider]  amLODipine (NORVASC) 2.5 MG tablet Take 2.5 mg by mouth daily.    [provider]  aspirin EC 325 MG EC tablet Take 1 tablet (325 mg total) by mouth daily. Patient taking differently: Take 81 mg by mouth daily. 03/28/21   Fritzi Mandes, MD  BREO ELLIPTA 100-25 MCG/INH AEPB Inhale 1 puff into the lungs daily.  Patient not taking: Reported on 10/28/2021 02/18/15   [provider]  budesonide (PULMICORT) 0.5 MG/2ML nebulizer solution Take 0.5 mg by nebulization 2 (two) times a week.    [provider]   celecoxib (CELEBREX) 200 MG capsule Take 1 capsule (200 mg total) by mouth 2 (two) times daily. 03/12/21   Fausto Skillern, PA-C  cetirizine (ZYRTEC) 10 MG tablet Take 10 mg by mouth daily.    [provider]  cyanocobalamin (,VITAMIN B-12,) 1000 MCG/ML injection Inject 1,000 mcg  into the muscle every 30 (thirty) days. 02/06/21   [provider]  ergocalciferol (VITAMIN D2) 1.25 MG (50000 UT) capsule Take 50,000 Units by mouth every Tuesday. 01/17/21   [provider]  fludrocortisone (FLORINEF) 0.1 MG tablet Take 0.1 mg by mouth daily.    [provider]  gabapentin (NEURONTIN) 400 MG capsule Take 1 capsule (400 mg total) by mouth at bedtime. Patient taking differently: Take 100 mg by mouth daily. 06/12/20   Loletha Grayer, MD  lamoTRIgine (LAMICTAL) 100 MG tablet Take 100 mg by mouth at bedtime. 03/02/19   [provider]  lansoprazole (PREVACID) 30 MG capsule Take 30 mg by mouth in the morning and at bedtime. 03/22/19   [provider]  magnesium oxide (MAG-OX) 400 MG tablet Take 400 mg by mouth daily.    [provider]  melatonin 3 MG TABS tablet Take 6 mg by mouth at bedtime. Patient not taking: Reported on 10/28/2021    [provider]  metoprolol succinate (TOPROL-XL) 50 MG 24 hr tablet Take 75 mg by mouth in the morning. 03/07/15   [provider]  montelukast (SINGULAIR) 10 MG tablet Take 10 mg by mouth at bedtime.    [provider]  naloxone [facility]) nasal spray 4 mg/0.1 mL Place 1 spray into the nose as needed (rev).    [provider]  nitroGLYCERIN (NITROLINGUAL) 0.4 MG/SPRAY spray Place 2 sprays under the tongue every 5 (five) minutes x 3 doses as needed for chest pain. 11/26/14   [provider]  polyethylene glycol (MIRALAX / GLYCOLAX) 17 g packet Take 17 g by mouth daily as needed for moderate constipation. 03/28/21   Fritzi Mandes, MD  QUEtiapine (SEROQUEL) 25 MG tablet Take 25  mg by mouth at bedtime. Patient not taking: Reported on 10/28/2021    [provider]  REPATHA SURECLICK 017 MG/ML SOAJ Inject 140 mg into the skin every 14 (fourteen) days. 01/27/21   [provider]  Semaglutide,0.25 or 0.'5MG'$ /DOS, (OZEMPIC, 0.25 OR 0.5 MG/DOSE,) 2 MG/1.5ML SOPN Inject 0.5 mg into the skin once a week.    [provider]  sodium chloride 1 g tablet Take by mouth. 05/10/21   [provider]  spironolactone (ALDACTONE) 25 MG tablet Take 12.5 mg by mouth daily.    [provider]  valACYclovir (VALTREX) 1000 MG tablet Take 1,000 mg by mouth daily as needed (Fever blisters). 11/04/17   [provider]    Physical Exam: Vitals:   03/26/22 1730 03/26/22 1745 03/26/22 1800 03/26/22 1815  BP: 115/62 (!) 114/54 106/61 (!) 109/53  Pulse: 79 77 75 78  Resp: 20 (!) '24 17 16  '$ Temp:      TempSrc:      SpO2: 92% 95% 93% 92%  Weight:      Height:       General: Not in acute distress HEENT:       Eyes: PERRL, EOMI, no scleral icterus.       ENT: No discharge from the ears and nose, no pharynx injection, no tonsillar enlargement.        Neck: No JVD, no bruit, no mass felt. Heme: No neck lymph node enlargement. Cardiac: S1/S2, RRR, No gallops or rubs. Respiratory: No rales, wheezing, rhonchi or rubs. GI: Soft, nondistended, nontender, no rebound pain, no organomegaly, BS present. GU: No hematuria Ext: No pitting leg edema bilaterally. 1+DP/PT pulse bilaterally. Musculoskeletal: No joint deformities, No joint redness or warmth, no limitation of ROM in  spin. Skin: No rashes.  Neuro: Alert, oriented X3, cranial nerves II-XII grossly intact, moves all extremities normally.  Psych: Patient is not psychotic, no suicidal or hemocidal ideation.  Labs on Admission: I have personally reviewed following labs and imaging studies  CBC: Recent Labs  Lab 03/26/22 0816  WBC 11.1*  NEUTROABS 7.7  HGB 14.6  HCT 41.1  MCV 85.1  PLT 924    Basic Metabolic Panel: Recent Labs  Lab 03/26/22 0816 03/26/22 0820 03/26/22 1038  NA 131*  --   --   K 2.6*  --   --   CL 91*  --   --   CO2 29  --   --   GLUCOSE 124*  --   --   BUN 11  --   --   CREATININE 1.06  --   --   CALCIUM 8.7*  --   --   MG  --  2.0  --   PHOS  --   --  2.6   GFR: Estimated Creatinine Clearance: 65.1 mL/min (by C-G formula based on SCr of 1.06 mg/dL). Liver Function Tests: Recent Labs  Lab 03/26/22 0816  AST 18  ALT 13  ALKPHOS 42  BILITOT 1.5*  PROT 7.3  ALBUMIN 3.9   Recent Labs  Lab 03/26/22 1038  LIPASE 34   No results for input(s): "AMMONIA" in the last 168 hours. Coagulation Profile: Recent Labs  Lab 03/26/22 0821  INR 1.1   Cardiac Enzymes: No results for input(s): "CKTOTAL", "CKMB", "CKMBINDEX", "TROPONINI" in the last 168 hours. BNP (last 3 results) No results for input(s): "PROBNP" in the last 8760 hours. HbA1C: No results for input(s): "HGBA1C" in the last 72 hours. CBG: No results for input(s): "GLUCAP" in the last 168 hours. Lipid Profile: No results for input(s): "CHOL", "HDL", "LDLCALC", "TRIG", "CHOLHDL", "LDLDIRECT" in the last 72 hours. Thyroid Function Tests: No results for input(s): "TSH", "T4TOTAL", "FREET4", "T3FREE", "THYROIDAB" in the last 72 hours. Anemia Panel: No results for input(s): "VITAMINB12", "FOLATE", "FERRITIN", "TIBC", "IRON", "RETICCTPCT" in the last 72 hours. Urine analysis:    Component Value Date/Time   COLORURINE AMBER (A) 03/26/2022 0820   APPEARANCEUR HAZY (A) 03/26/2022 0820   APPEARANCEUR Clear 07/03/2020 1007   LABSPEC 1.017 03/26/2022 0820   LABSPEC 1.011 03/22/2013 0840   PHURINE 6.0 03/26/2022 0820   GLUCOSEU NEGATIVE 03/26/2022 0820   GLUCOSEU Negative 03/22/2013 0840   HGBUR NEGATIVE 03/26/2022 0820   BILIRUBINUR NEGATIVE 03/26/2022 0820   BILIRUBINUR Negative 07/03/2020 1007   BILIRUBINUR Negative 03/22/2013 0840   KETONESUR 20 (A) 03/26/2022 0820   PROTEINUR 30  (A) 03/26/2022 0820   NITRITE NEGATIVE 03/26/2022 0820   LEUKOCYTESUR NEGATIVE 03/26/2022 0820   LEUKOCYTESUR Negative 03/22/2013 0840   Sepsis Labs: '@LABRCNTIP'$ (procalcitonin:4,lacticidven:4) ) Recent Results (from the past 240 hour(s))  MRSA Next Gen by PCR, Nasal     Status: None   Collection Time: 03/26/22  4:55 PM   Specimen: Nasal Mucosa; Nasal Swab  Result Value Ref Range Status   MRSA by PCR Next Gen NOT DETECTED NOT DETECTED Final    Comment: (NOTE) The GeneXpert MRSA Assay (FDA approved for NASAL specimens only), is one component of a comprehensive MRSA colonization surveillance program. It is not intended to diagnose MRSA infection nor to guide or monitor treatment for MRSA infections. Test performance is not FDA approved in patients less than 51 years old. Performed at [facility], [address] [place], [place] [address]  Radiological Exams on Admission: PERIPHERAL VASCULAR CATHETERIZATION  Result Date: 03/26/2022 See surgical note for result.  CT Abdomen Pelvis W Contrast  Result Date: 03/26/2022 CLINICAL DATA:  Pulmonary embolism (PE) suspected, high prob R sided pleurisy; RLQ abdominal pain EXAM: CT ANGIOGRAPHY CHEST CT ABDOMEN AND PELVIS WITH CONTRAST TECHNIQUE: Multidetector CT imaging of the chest was performed using the standard protocol during bolus administration of intravenous contrast. Multiplanar CT image reconstructions and MIPs were obtained to evaluate the vascular anatomy. Multidetector CT imaging of the abdomen and pelvis was performed using the standard protocol during bolus administration of intravenous contrast. RADIATION DOSE REDUCTION: This exam was performed according to the departmental dose-optimization program which includes automated exposure control, adjustment of the mA and/or kV according to patient size and/or use of iterative reconstruction technique. CONTRAST:  143m OMNIPAQUE IOHEXOL 350 MG/ML SOLN COMPARISON:  Chest CT  05/29/2021 FINDINGS: CTA CHEST FINDINGS Cardiovascular: Satisfactory opacification of the pulmonary arteries to the segmental level. Central filling defect in the distal right pulmonary artery extending into the lobar arteries, segmental branches of the right upper and lower lobes, and subsegmental branches of the right lower lobe. RV:LV ratio measures 1.2.No pericardial disease. Coronary artery atherosclerosis. Mitral annular and aortic valve calcifications. Scattered atherosclerosis of the thoracic aorta. Mediastinum/Nodes: No lymphadenopathy. The thyroid is unremarkable. Lungs/Pleura: There is diffuse bronchial wall thickening, lower lung predominant. There is interlobular septal thickening and ground-glass opacities bilaterally with small right pleural effusion and bibasilar atelectasis. There is peribronchovascular consolidation in the right lower lobe (series 6, image 225). There are multiple pulmonary nodules as seen on multiple prior chest CTs, some of which are obscured by acute pulmonary disease. Nodules in the right lower, right middle, and right upper lobe measure up to 4-5 mm (series 6, images 205, 191, and 145 respectively). Musculoskeletal: No acute osseous abnormality. No suspicious osseous lesion. Review of the MIP images confirms the above findings. CT ABDOMEN and PELVIS FINDINGS Hepatobiliary: No focal liver abnormality is seen. The gallbladder is unremarkable. Pancreas: Unremarkable. No pancreatic ductal dilatation or surrounding inflammatory changes. Spleen: Normal in size without focal abnormality. Adrenals/Urinary Tract: Adrenal glands are unremarkable. No hydronephrosis or nephrolithiasis. The bladder is moderately distended. Stomach/Bowel: The stomach is within normal limits. There is no evidence of bowel obstruction.The appendix is normal. Vascular/Lymphatic: Aortoiliac atherosclerosis. No AAA. No lymphadenopathy. Reproductive: Mildly enlarged prostate gland. Other: Bilateral fat containing  inguinal hernias, right greater than left. Musculoskeletal: No acute osseous abnormality. No suspicious osseous lesion. Prior lumbosacral and right SI joint fusion without evidence of hardware complication. Review of the MIP images confirms the above findings. IMPRESSION: Positive for acute pulmonary emboli in the distal right pulmonary artery, extending into the lobar, segmental, and subsegmental branches, and with CT evidence of right heart strain. Pulmonary edema with small right pleural effusion and bibasilar atelectasis. Additional peribronchovascular consolidation in the right lower lung with adjacent endobronchial material in the right lower lobe bronchi, potentially pneumonia and/or aspiration. Multiple pulmonary nodules as seen on prior exams, some of which are obscured by current pulmonary disease, those which are visible measuring 4-5 mm and unchanged from prior exam in March 2023. Recommend follow-up chest CT after resolution of acute illness for adequate comparison. No acute findings in the abdomen or pelvis. Critical Value/emergent results were called by telephone at the time of interpretation on 03/26/2022 at 11:28 am to provider [facility] who verbally acknowledged these results. Electronically Signed   By: JMaurine SimmeringM.D.   On: 03/26/2022 11:39  CT Angio Chest PE W and/or Wo Contrast  Result Date: 03/26/2022 CLINICAL DATA:  Pulmonary embolism (PE) suspected, high prob R sided pleurisy; RLQ abdominal pain EXAM: CT ANGIOGRAPHY CHEST CT ABDOMEN AND PELVIS WITH CONTRAST TECHNIQUE: Multidetector CT imaging of the chest was performed using the standard protocol during bolus administration of intravenous contrast. Multiplanar CT image reconstructions and MIPs were obtained to evaluate the vascular anatomy. Multidetector CT imaging of the abdomen and pelvis was performed using the standard protocol during bolus administration of intravenous contrast. RADIATION DOSE REDUCTION: This exam was performed  according to the departmental dose-optimization program which includes automated exposure control, adjustment of the mA and/or kV according to patient size and/or use of iterative reconstruction technique. CONTRAST:  13m OMNIPAQUE IOHEXOL 350 MG/ML SOLN COMPARISON:  Chest CT 05/29/2021 FINDINGS: CTA CHEST FINDINGS Cardiovascular: Satisfactory opacification of the pulmonary arteries to the segmental level. Central filling defect in the distal right pulmonary artery extending into the lobar arteries, segmental branches of the right upper and lower lobes, and subsegmental branches of the right lower lobe. RV:LV ratio measures 1.2.No pericardial disease. Coronary artery atherosclerosis. Mitral annular and aortic valve calcifications. Scattered atherosclerosis of the thoracic aorta. Mediastinum/Nodes: No lymphadenopathy. The thyroid is unremarkable. Lungs/Pleura: There is diffuse bronchial wall thickening, lower lung predominant. There is interlobular septal thickening and ground-glass opacities bilaterally with small right pleural effusion and bibasilar atelectasis. There is peribronchovascular consolidation in the right lower lobe (series 6, image 225). There are multiple pulmonary nodules as seen on multiple prior chest CTs, some of which are obscured by acute pulmonary disease. Nodules in the right lower, right middle, and right upper lobe measure up to 4-5 mm (series 6, images 205, 191, and 145 respectively). Musculoskeletal: No acute osseous abnormality. No suspicious osseous lesion. Review of the MIP images confirms the above findings. CT ABDOMEN and PELVIS FINDINGS Hepatobiliary: No focal liver abnormality is seen. The gallbladder is unremarkable. Pancreas: Unremarkable. No pancreatic ductal dilatation or surrounding inflammatory changes. Spleen: Normal in size without focal abnormality. Adrenals/Urinary Tract: Adrenal glands are unremarkable. No hydronephrosis or nephrolithiasis. The bladder is moderately  distended. Stomach/Bowel: The stomach is within normal limits. There is no evidence of bowel obstruction.The appendix is normal. Vascular/Lymphatic: Aortoiliac atherosclerosis. No AAA. No lymphadenopathy. Reproductive: Mildly enlarged prostate gland. Other: Bilateral fat containing inguinal hernias, right greater than left. Musculoskeletal: No acute osseous abnormality. No suspicious osseous lesion. Prior lumbosacral and right SI joint fusion without evidence of hardware complication. Review of the MIP images confirms the above findings. IMPRESSION: Positive for acute pulmonary emboli in the distal right pulmonary artery, extending into the lobar, segmental, and subsegmental branches, and with CT evidence of right heart strain. Pulmonary edema with small right pleural effusion and bibasilar atelectasis. Additional peribronchovascular consolidation in the right lower lung with adjacent endobronchial material in the right lower lobe bronchi, potentially pneumonia and/or aspiration. Multiple pulmonary nodules as seen on prior exams, some of which are obscured by current pulmonary disease, those which are visible measuring 4-5 mm and unchanged from prior exam in March 2023. Recommend follow-up chest CT after resolution of acute illness for adequate comparison. No acute findings in the abdomen or pelvis. Critical Value/emergent results were called by telephone at the time of interpretation on 03/26/2022 at 11:28 am [facility] verbally acknowledged these results. Electronically Signed   By: JMaurine SimmeringM.D.   On: 03/26/2022 11:39      Assessment/Plan Principal Problem:   Acute pulmonary embolism (HCC) Active Problems:  History of DVT of lower extremity   Aspiration pneumonia (HCC)   Bradycardia   Chronic diastolic CHF (congestive heart failure) (HCC)   Seizure (HCC)   CAD (coronary artery disease)   Hyperlipidemia   Essential hypertension   History of CVA (cerebrovascular accident)    Asthma without status asthmaticus   Hypokalemia   Lung nodule   Anxiety and depression   Assessment and Plan:    Acute pulmonary embolism and  history of DVT of lower extremity: CTA showed acute pulmonary emboli in the distal right pulmonary artery, extending into the lobar, segmental, and subsegmental branches, and with CT evidence of right heart strain. Pt is hemodynamically stable. 2d echo showed EF of 55 to 60% with grade 1 diastolic dysfunction, right ventricular seems to be normal. Consult Dr. Lucky Cowboy of [facility] --> Pulmonary Thrombectomy.  -admit to stepdown as pt -heparin drip initiated -LE dopplers ordered to evaluate for DVT -pain control: When necessary Percocet and morphine -prn albuterol nebs and mucinex   Possible aspiration pneumonia ([place]): CAT also showed peribronchovascular consolidation in the right lower lung with adjacent endobronchial material in the right lower lobe bronchi, indicating possible aspiration. -start Unasyn (patient has received 1 dose of Zosyn in ED) -Sputum culture  Bradycardia: HR 40-50s. -check TSH level -tele monitoring -avoid nodal blockers -hold metoprolol  Chronic diastolic CHF (congestive heart failure) ([place]): 2D echo on 03/26/2021 showed EF of 55 to 60% with grade 2 diastolic dysfunction.  Patient has no leg edema, does not seem to have CHF exacerbation.  BNP 108. -Continue home lasix  Seizure -Seizure precaution -When necessary Ativan for seizure -Continue Home medications: Lamictal  CAD (coronary artery disease) -ASA and  Rapatha  Hyperlipidemia -Rapatha  Essential hypertension -IV hydralazine as needed -Amlodipine -Hold metoprolol due to bradycardia  History of CVA (cerebrovascular accident) -Aspirin  Asthma without status asthmaticus: Stable -Bronchodilators  Hypokalemia: Potassium 2.6, magnesium 2.0, phosphorus 2.6. -Repleted potassium  Lung nodule: Incidental findings by CTA -Need to follow-up with PCP as  outpatient  Anxiety and depression -Currently no home medications     DVT ppx: SQ Heparin      Code Status: Full code  Family Communication: Yes, patient's wife at bed side.     Disposition Plan:  Anticipate discharge back to previous environment  Consults called:   Dr. Lucky Cowboy of [facility] is consulted.   Admission status and Level of care: Stepdown:    as inpt      Dispo: The patient is from: Home              Anticipated d/c is to: Home              Anticipated d/c date is: 2 days              Patient currently is not medically stable to d/c.    Severity of Illness:  The appropriate patient status for this patient is INPATIENT. Inpatient status is judged to be reasonable and necessary in order to provide the required intensity of service to ensure the patient's safety. The patient's presenting symptoms, physical exam findings, and initial radiographic and laboratory data in the context of their chronic comorbidities is felt to place them at high risk for further clinical deterioration. Furthermore, it is not anticipated that the patient will be medically stable for discharge from the hospital within 2 midnights of admission.   * I certify that at the point of admission it is my clinical judgment that the patient will  require inpatient hospital care spanning beyond 2 midnights from the point of admission due to high intensity of service, high risk for further deterioration and high frequency of surveillance required.*       Date of Service 03/26/2022    Ivor [facility]   If 7PM-7AM, please contact night-coverage [email or web address] 03/26/2022, 6:32 PM

## 2022-03-26 NOTE — ED Triage Notes (Addendum)
Pt c/o right flank pain that started 2 nights ago. PT denies any associated symptoms in regards to the flank pain. Pt is AxOx4. During triage, patient is bradycardic in the upper 40s, states his HR is never that low. He does endorse some dizziness and feeling light headed over the past few days. Denies cp or sob.

## 2022-03-26 NOTE — Consult Note (Signed)
Pharmacy Antibiotic Note  Johnny Mccoy is a 77 y.o. male admitted on 03/26/2022 with pneumonia.  Pharmacy has been consulted for Unasyn dosing. CT chest positive for PE. Pt received a dose of pip/tazo in the ED.   Plan: Will start Unasyn 3 g q6H.   Height: '5\' 8"'$  (172.7 cm) Weight: 88.5 kg (195 lb) IBW/kg (Calculated) : 68.4  Temp (24hrs), Avg:98.7 F (37.1 C), Min:98.2 F (36.8 C), Max:99.2 F (37.3 C)  Recent Labs  Lab 03/26/22 0816  WBC 11.1*  CREATININE 1.06    Estimated Creatinine Clearance: 65.1 mL/min (by C-G formula based on SCr of 1.06 mg/dL).    Allergies  Allergen Reactions   Fentanyl Other (See Comments)    Possible involuntary tremors Other reaction(s): Other (See Comments) Involuntary tremors. Patient said this was looked into and it was not the cause of the tremors   Methocarbamol Other (See Comments)    confusion    Antimicrobials this admission: 1/25 Unasyn >>   Dose adjustments this admission: None  Microbiology results: 1/24 BCx: pending 1/24 UCx: pending   Thank you for allowing pharmacy to be a part of this patient's care.  Oswald Hillock, PharmD, BCPS 03/26/2022 1:53 PM

## 2022-03-26 NOTE — Consult Note (Signed)
PHARMACY -  BRIEF ANTIBIOTIC NOTE   Pharmacy has received consult(s) for CAP from an ED provider.  The patient's profile has been reviewed for ht/wt/allergies/indication/available labs.    One time order(s) placed for pip/tazo  Further antibiotics/pharmacy consults should be ordered by admitting physician if indicated.                       Thank you, Oswald Hillock 03/26/2022  12:15 PM

## 2022-03-26 NOTE — Consult Note (Signed)
Hospital Consult    Reason for Consult:  Pulmonary Embolism Requesting Physician:  Dr Delman Kitten MD  MRN #:  [id number]  History of Present Illness: This is a 77 y.o. male history of coronary disease stroke, anesthesia complications, multiple surgeries, previous pulmonary embolism and DVTs with IVC placement related to surgeries.   He presents to to [facility] ED and reports that about a day and a half ago he started having a sharp splitting pain along his right lower rib cage.  No falls or injuries but he reports the pain feels like he is got a rib fracture or something.  It is quite severe to the point that it limits his ability to even eat or drink because it so painful along his right rib.   He reports any type of breathing or taking a deep breath is extremely painful. He does have a history of Pulmonary Embolism. Currently not on any anticoagulation.    Past Medical History:  Diagnosis Date   (HFpEF) heart failure with preserved ejection fraction ([place]) 02/27/2014   a.) TTE 02/27/14: EF >55%; mod LVH; mild panvalvular regurgitation; (+) global stress induced HK. b.) TTE 09/20/14: EF 55%; triv AR/MR, mild TR/PR; mild AS; BAE; c.) TTE 06/20/15: EF 55%, triv AR/MR; BAE; mild AS. d.) TTE 02/11/17: EF 55; significant  RV dilation with mod systolic dysfunction; mild AS. e.) TTE 02/27/19: EF 55%; mild AS. f.) TTE 06/04/20: EF 50%; mild AS   Anxiety    Aortic stenosis 09/20/2014   a.) TTE 09/20/2014: mild; MPG?. b.) TTE 06/20/2015: mild; MPG 8.0 mmHg. c.) TTE 02/11/2017: mild; MPG 12.4 mmHg. d.) TTE 02/27/2019: mild; MPG 11.4 mmHg. e.) TTE 06/04/2020: mild; MPG 9.6 mmHg.   Arthritis    Asthma    CAD (coronary artery disease)    Cataracts, bilateral    Chronic back pain    Chronic venous insufficiency    Complication of anesthesia    a.) postoperative amnesia (1999). b.) seizures after partial knee in 2014; sent to [facility] x 5 days d/t uncontrollable shaking and AMS; ?? fentanyl induced tremors. c.)  anesthesia awareness   COVID-19 05/2018   CVA (cerebral vascular accident) [facility])    Depressed    Essential tremor    GERD (gastroesophageal reflux disease)    Hepatitis B    a.) h/o in the 90s; reports not active at present   History of bronchitis    Jan-Mar 2017   History of hiatal hernia    Hyperlipidemia    Hypertension    Insomnia    Joint pain    Lymphedema    Mild cognitive impairment    Muscle spasm    in neck.Takes Flexeril as needed   Numbness    right knee,right hand,and toes on left   RBBB    Seizures (HCC)    [place]. takes Lamotrigine daily. Last seizure 6+ yrs ago   Sleep apnea with use of continuous positive airway pressure (CPAP)    VTE (venous thromboembolism)    a.) s/p RIGHT TKA    Past Surgical History:  Procedure Laterality Date   ANKLE ARTHROSCOPY Left 03/07/2020   Procedure: ANKLE ARTHROSCOPY;  Surgeon: Caroline More, DPM;  Location: [place];  Service: Podiatry;  Laterality: Left;   BACK SURGERY     x3   BLEPHAROPLASTY     CARDIAC CATHETERIZATION     2015   CARPAL TUNNEL RELEASE     COLONOSCOPY     CORONARY ANGIOPLASTY WITH STENT PLACEMENT  x 3   CORONARY ANGIOPLASTY WITH STENT PLACEMENT Left 01/30/2003   Procedure: CORONARY ANGIOPLASTY WITH STENT PLACEMENT (2.75 x 32 mm Taxus stent to RCA); Location: [facility]; Surgeon: Courtney Paris, MD   dental implants     ESOPHAGOGASTRODUODENOSCOPY     EYE SURGERY     both eyes in 60's d/t muscle causing to squint   IVC FILTER INSERTION N/A 03/04/2021   Procedure: IVC FILTER INSERTION;  Surgeon: Katha Cabal, MD;  Location: [place] [facility];  Service: Cardiovascular;  Laterality: N/A;   IVC FILTER REMOVAL N/A 05/27/2021   Procedure: IVC FILTER REMOVAL;  Surgeon: Algernon Huxley, MD;  Location: [place] [facility];  Service: Cardiovascular;  Laterality: N/A;   JOINT REPLACEMENT Right    knee   KNEE ARTHROSCOPY     LEFT HEART CATH AND CORONARY ANGIOGRAPHY Left 04/12/2019    Procedure: LEFT HEART CATH AND CORONARY ANGIOGRAPHY;  Surgeon: Corey Skains, MD;  Location: [place] [facility];  Service: Cardiovascular;  Laterality: Left;   MEDIAL PARTIAL KNEE REPLACEMENT Right    MEDIASTERNOTOMY N/A 10/28/2015   Procedure: PARTIAL STERNOTOMY;  Surgeon: [name], MD;  Location: [place];  Service: Thoracic;  Laterality: N/A;   numerous hand surgery     ORIF ANKLE FRACTURE Left 03/07/2020   Procedure: OPEN REDUCTION INTERNAL FIXATION (ORIF) ANKLE FRACTURE;  Surgeon: Caroline More, DPM;  Location: 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   Procedure: RESECTION OF anterier MEDIASTINAL TUMOR;  Surgeon: [name], MD;  Location: [place];  Service: Thoracic;  Laterality: N/A;   right hand fusion     rotorblator  03/1997   anteriograde amnesia resulted   TENDON REPAIR Left 03/07/2020   Procedure: FLEXOR TENDON REPAIR;  Surgeon: Caroline More, DPM;  Location: [place];  Service: Podiatry;  Laterality: Left;  sleep apnea   TONSILLECTOMY     TOTAL KNEE REVISION Right 03/10/2021   Procedure: TOTAL KNEE REVISION;  Surgeon: Dereck Leep, MD;  Location: [facility];  Service: Orthopedics;  Laterality: Right;   UMBILICAL HERNIA REPAIR      Allergies  Allergen Reactions   Fentanyl Other (See Comments)    Possible involuntary tremors Other reaction(s): Other (See Comments) Involuntary tremors. Patient said this was looked into and it was not the cause of the tremors   Methocarbamol Other (See Comments)    confusion    Prior to Admission medications   Medication Sig Start Date End Date Taking? Authorizing Provider  albuterol (PROVENTIL) (2.5 MG/3ML) 0.083% nebulizer solution Take 2.5 mg by nebulization 2 (two) times a week.    [provider]  albuterol (VENTOLIN HFA) 108 (90 Base) MCG/ACT inhaler Inhale 2 puffs into the lungs every 4 (four) hours as needed for wheezing or  shortness of breath.    [provider]  amLODipine (NORVASC) 2.5 MG tablet Take 2.5 mg by mouth daily.    [provider]  aspirin EC 325 MG EC tablet Take 1 tablet (325 mg total) by mouth daily. Patient taking differently: Take 81 mg by mouth daily. 03/28/21   Fritzi Mandes, MD  BREO ELLIPTA 100-25 MCG/INH AEPB Inhale 1 puff into the lungs daily.  Patient not taking: Reported on 10/28/2021 02/18/15   [provider]  budesonide (PULMICORT) 0.5 MG/2ML nebulizer solution Take 0.5 mg by nebulization 2 (two) times a week.    [provider]  celecoxib (CELEBREX) 200  MG capsule Take 1 capsule (200 mg total) by mouth 2 (two) times daily. 03/12/21   Fausto Skillern, PA-C  cetirizine (ZYRTEC) 10 MG tablet Take 10 mg by mouth daily.    [provider]  cyanocobalamin (,VITAMIN B-12,) 1000 MCG/ML injection Inject 1,000 mcg into the muscle every 30 (thirty) days. 02/06/21   [provider]  ergocalciferol (VITAMIN D2) 1.25 MG (50000 UT) capsule Take 50,000 Units by mouth every Tuesday. 01/17/21   [provider]  fludrocortisone (FLORINEF) 0.1 MG tablet Take 0.1 mg by mouth daily.    [provider]  gabapentin (NEURONTIN) 400 MG capsule Take 1 capsule (400 mg total) by mouth at bedtime. Patient taking differently: Take 100 mg by mouth daily. 06/12/20   Loletha Grayer, MD  lamoTRIgine (LAMICTAL) 100 MG tablet Take 100 mg by mouth at bedtime. 03/02/19   [provider]  lansoprazole (PREVACID) 30 MG capsule Take 30 mg by mouth in the morning and at bedtime. 03/22/19   [provider]  magnesium oxide (MAG-OX) 400 MG tablet Take 400 mg by mouth daily.    [provider]  melatonin 3 MG TABS tablet Take 6 mg by mouth at bedtime. Patient not taking: Reported on 10/28/2021    [provider]  metoprolol succinate (TOPROL-XL) 50 MG 24 hr tablet Take 75 mg by mouth in the morning. 03/07/15   [provider]  montelukast (SINGULAIR) 10 MG tablet Take 10 mg by mouth at bedtime.    [provider]  naloxone [facility]) nasal spray 4 mg/0.1 mL Place 1 spray into the nose as needed (rev).    [provider]  nitroGLYCERIN (NITROLINGUAL) 0.4 MG/SPRAY spray Place 2 sprays under the tongue every 5 (five) minutes x 3 doses as needed for chest pain. 11/26/14   [provider]  polyethylene glycol (MIRALAX / GLYCOLAX) 17 g packet Take 17 g by mouth daily as needed for moderate constipation. 03/28/21   Fritzi Mandes, MD  QUEtiapine (SEROQUEL) 25 MG tablet Take 25 mg by mouth at bedtime. Patient not taking: Reported on 10/28/2021    [provider]  REPATHA SURECLICK 580 MG/ML SOAJ Inject 140 mg into the skin every 14 (fourteen) days. 01/27/21   [provider]  Semaglutide,0.25 or 0.'5MG'$ /DOS, (OZEMPIC, 0.25 OR 0.5 MG/DOSE,) 2 MG/1.5ML SOPN Inject 0.5 mg into the skin once a week.    [provider]  sodium chloride 1 g tablet Take by mouth. 05/10/21   [provider]  spironolactone (ALDACTONE) 25 MG tablet Take 12.5 mg by mouth daily.    [provider]  valACYclovir (VALTREX) 1000 MG tablet Take 1,000 mg by mouth daily as needed (Fever blisters). 11/04/17   [provider]    Social History   Socioeconomic History   Marital status: Married    Spouse name: Not on file   Number of children: 2   Years of education: Not on file   Highest education level: Not on file  Occupational History   Occupation: retired Theme park manager  Tobacco Use   Smoking status: Never   Smokeless tobacco: Never  Vaping Use   Vaping Use: Never used  Substance and Sexual Activity   Alcohol use: Yes    Alcohol/week: 14.0 standard drinks of alcohol    Types: 14 Shots of liquor per week    Comment: wine and couple shots of liquor each evening   Drug use: No   Sexual activity: Yes  Other Topics Concern  Not on file  Social History Narrative   Not on file    Social Determinants of Health   Financial Resource Strain: Not on file  Food Insecurity: Not on file  Transportation Needs: Not on file  Physical Activity: Not on file  Stress: Not on file  Social Connections: Not on file  Intimate Partner Violence: Not on file    Family History  Problem Relation Age of Onset   Cancer Father 96       Bone    COPD Father    Heart disease Mother    Hyperlipidemia Mother    Stroke Mother    Heart disease Maternal Grandfather    Hyperlipidemia Maternal Grandfather     ROS: Otherwise negative unless mentioned in HPI  Physical Examination  Vitals:   03/26/22 0814  BP: (!) 131/98  Pulse: (!) 48  Resp: 18  Temp: 99.2 F (37.3 C)  SpO2: 95%   Body mass index is 29.65 kg/m.  General:  WDWN in NAD Gait: Not observed HENT: WNL, normocephalic Pulmonary: normal labored breathing, without Rales, rhonchi,  wheezing. Not moving much air. Very diminished breath sounds on the right side.  Cardiac: irregular, Bradycardia in the 40's , without  Murmurs, rubs or gallops; without carotid bruits Abdomen: Positive bowel sounds, soft, NT/ND, no masses Skin: without rashes Vascular Exam/Pulses: Bilateral lower extremity pulses are palpable. Pain on palpation to bilateral lower calves.  Extremities: without ischemic changes, without Gangrene , without cellulitis; without open wounds;  Musculoskeletal: no muscle wasting or atrophy  Neurologic: A&O X 3;  No focal weakness or paresthesias are detected; speech is fluent/normal Psychiatric:  The pt has Normal affect. Lymph:  Unremarkable  CBC    Component Value Date/Time   WBC 11.1 (H) 03/26/2022 0816   RBC 4.83 03/26/2022 0816   HGB 14.6 03/26/2022 0816   HGB 11.0 (L) 04/10/2013 1711   HCT 41.1 03/26/2022 0816   HCT 30.9 (L) 04/10/2013 1711   PLT 159 03/26/2022 0816   PLT 146 (L) 04/10/2013 1711   MCV 85.1 03/26/2022 0816   MCV 92 04/10/2013 1711   MCH 30.2 03/26/2022 0816   MCHC 35.5  03/26/2022 0816   RDW 14.2 03/26/2022 0816   RDW 13.1 04/10/2013 1711   LYMPHSABS 1.8 03/26/2022 0816   MONOABS 1.4 (H) 03/26/2022 0816   EOSABS 0.2 03/26/2022 0816   BASOSABS 0.0 03/26/2022 0816    BMET    Component Value Date/Time   NA 131 (L) 03/26/2022 0816   NA 140 03/02/2014 0420   K 2.6 (LL) 03/26/2022 0816   K 4.1 03/02/2014 0420   CL 91 (L) 03/26/2022 0816   CL 105 03/02/2014 0420   CO2 29 03/26/2022 0816   CO2 30 03/02/2014 0420   GLUCOSE 124 (H) 03/26/2022 0816   GLUCOSE 105 (H) 03/02/2014 0420   BUN 11 03/26/2022 0816   BUN 11 03/02/2014 0420   CREATININE 1.06 03/26/2022 0816   CREATININE 0.92 03/02/2014 0420   CALCIUM 8.7 (L) 03/26/2022 0816   CALCIUM 7.9 (L) 03/02/2014 0420   GFRNONAA >60 03/26/2022 0816   GFRNONAA >60 03/02/2014 0420   GFRNONAA >60 04/10/2013 1711   GFRAA >60 10/30/2015 1703   GFRAA >60 03/02/2014 0420   GFRAA >60 04/10/2013 1711    COAGS: Lab Results  Component Value Date   INR 1.0 02/26/2021   INR 0.99 10/23/2015   INR 0.9 03/22/2013     Non-Invasive Vascular Imaging:   EXAM: CT ANGIOGRAPHY CHEST  FINDINGS: CTA CHEST FINDINGS   Cardiovascular: Satisfactory opacification of the pulmonary arteries to the segmental level. Central filling defect in the distal right pulmonary artery extending into the lobar arteries, segmental branches of the right upper and lower lobes, and subsegmental branches of the right lower lobe. RV:LV ratio measures 1.2.No pericardial disease. Coronary artery atherosclerosis. Mitral annular and aortic valve calcifications. Scattered atherosclerosis of the thoracic aorta.  Lungs/Pleura: There is diffuse bronchial wall thickening, lower lung predominant. There is interlobular septal thickening and ground-glass opacities bilaterally with small right pleural effusion and bibasilar atelectasis. There is peribronchovascular consolidation in the right lower lobe (series 6, image  225).  IMPRESSION: Positive for acute pulmonary emboli in the distal right pulmonary artery, extending into the lobar, segmental, and subsegmental branches, and with CT evidence of right heart strain.   Pulmonary edema with small right pleural effusion and bibasilar atelectasis. Additional peribronchovascular consolidation in the right lower lung with adjacent endobronchial material in the right lower lobe bronchi, potentially pneumonia and/or aspiration.  Statin:  No. Beta Blocker:  Yes.   Aspirin:  Yes.   ACEI:  No. ARB:  No. CCB use:  No Other antiplatelets/anticoagulants:  No.    ASSESSMENT/PLAN: This is a 77 y.o. male who on CT of the Chest is positive for an acute pulmonary embolism. He is in what appears to be heart block on EKG. His potassium is 2.6 and he is getting IV K+ now. ED to start heparin IV stat. His oxygen saturation is 88 % on room air and he is unable to take a deep breath. He has had a PE in the past with IVC filter placement post surgery and prior to surgery. ON CT Scan today he is Positive for a Pulmonary embolism. Pain on palpation to bilateral calves. Patient currently no on any anticoagulation   PLAN:   Start Heparin drip Pulmonary Thrombectomy with IVC filter placement today.  Venous Ultrasound of bilateral lower extremities to rule out DVT's        Drema Pry [facility] 03/26/2022 11:37 AM

## 2022-03-26 NOTE — Op Note (Signed)
Johnny Mccoy  Percutaneous Study/Intervention Procedural Note   Date of Surgery: 03/26/2022,3:54 PM  Surgeon: Leotis Pain  Pre-operative Diagnosis: Symptomatic pulmonary emboli  Post-operative diagnosis:  Same  Procedure(s) Performed:  1.  Contrast injection right heart  2.  Thrombolysis with 6 mg of tPA to the right pulmonary arteries  3.  Mechanical thrombectomy using the penumbra CAT 12 catheter to the right lower lobe, middle lobe, and upper lobe pulmonary arteries  4.  Selective catheter placement right lower lobe, middle lobe, and upper lobe pulmonary arteries  5.  Selective catheter placement left lower lobe and upper lobe pulmonary arteries  6.  IVC filter placement    Anesthesia: Conscious sedation was administered under my direct supervision by the interventional radiology RN. IV Versed plus fentanyl were utilized. Continuous ECG, pulse oximetry and blood pressure was monitored throughout the entire procedure.  Versed and fentanyl were administered intravenously.  Conscious sedation was administered for a total of 26 minutes using 2 mg of Versed and 50 mcg of Fentanyl.  EBL: 300 cc  Sheath: 11 French right femoral vein  Contrast: 50 cc   Fluoroscopy Time: 9.2 minutes  Indications:  Patient presents with pulmonary emboli. The patient is symptomatic with hypoxemia and dyspnea on exertion.  There is evidence of right heart strain on the CT angiogram. The patient is otherwise a good candidate for intervention and even the long-term benefits pulmonary angiography with thrombolysis is offered. The risks and benefits are reviewed long-term benefits are discussed. All questions are answered patient agrees to proceed.  Procedure:  Johnny Brandi Trotteris a 77 y.o. male who was identified and appropriate procedural time out was performed.  The patient was then placed supine on the table and prepped and draped in the usual sterile fashion.  Ultrasound was used to  evaluate the right common femoral vein.  It was patent, as it was echolucent and compressible.  A digital ultrasound image was acquired for the permanent record.  A Seldinger needle was used to access the right common femoral vein under direct ultrasound guidance.  A 0.035 J wire was advanced without resistance and a 5Fr sheath was placed.  A ProGlide device was placed and then upsized to an 11 [place] sheath.    The wire and pigtail catheter were then negotiated into the right atrium and bolus injection of contrast was utilized to demonstrate the right ventricle and the pulmonary artery outflow. The wire and catheter were then negotiated into the main pulmonary artery where hand injection of contrast was utilized to demonstrate the pulmonary arteries and confirm the locations of the pulmonary emboli.  A JR4 catheter and an advantage wire was first used to cannulate the left lower lobe and then the left upper lobe pulmonary artery where selective imaging is performed.  No significant thrombus burden was seen on the left side. I then used the JR4 catheter and the advantage wire to transition over to the right side.  I first selectively cannulated the right upper lobe and then the right middle and lower lobe pulmonary arteries were selective imaging was performed. No significant thrombus burden was seen on the left side.  TPA was reconstituted and delivered onto the table. A total of 6 milligrams of TPA was utilized.  All 6 mg was used on the right side. This was then allowed to dwell.  The Penumbra Cat 12 catheter was then advanced up into the pulmonary vasculature. The right lung was addressed first. Catheter was negotiated into  the right lower lobe pulmonary artery and mechanical thrombectomy was performed using the separator. Follow-up imaging demonstrated a good result and therefore the catheter was renegotiated into the right middle lobe pulmonary artery and again mechanical thrombectomy was performed.  Passes were made with both the Penumbra catheter itself as well as introducing the separator.  Finally, the catheter was advanced up into the right upper lobe pulmonary artery with the help of the advantage wire and the select catheter.  Mechanical thrombectomy was performed in the right upper lobe pulmonary artery with the penumbra CAT 12 catheter as well.  Follow-up imaging was then performed.  Marked improvement was seen.  Removed thrombectomy catheter replacing a versa core wire.  Imaging through the right femoral sheath showed the iliac veins to be widely patent and the IVC to be widely patent.  The renal veins were at the top of L1 and the filter was deployed at the L1-L2 interspace.  The delivery system was then removed.  After review these images wires were reintroduced and the catheters removed. Then, the sheath is then pulled up, the Pro-glide device was secured, and pressure is held. A safeguard is placed.    Findings:   Right heart imaging:  Right atrium and right ventricle and the pulmonary outflow tract appears fairly normal  Right lung: Significant thrombus burden was seen in the right lower lobe, right middle lobe, and right upper lobe pulmonary arteries.  Left lung:  No significant thrombus burden was seen on the left side.    Disposition: Patient was taken to the recovery room in stable condition having tolerated the procedure well.  Leotis Pain 03/26/2022,3:54 PM

## 2022-03-27 ENCOUNTER — Inpatient Hospital Stay: Payer: Medicare Other

## 2022-03-27 DIAGNOSIS — J4540 Moderate persistent asthma, uncomplicated: Secondary | ICD-10-CM

## 2022-03-27 DIAGNOSIS — I2510 Atherosclerotic heart disease of native coronary artery without angina pectoris: Secondary | ICD-10-CM | POA: Diagnosis not present

## 2022-03-27 DIAGNOSIS — E876 Hypokalemia: Secondary | ICD-10-CM

## 2022-03-27 DIAGNOSIS — I2609 Other pulmonary embolism with acute cor pulmonale: Secondary | ICD-10-CM

## 2022-03-27 DIAGNOSIS — R569 Unspecified convulsions: Secondary | ICD-10-CM

## 2022-03-27 DIAGNOSIS — E785 Hyperlipidemia, unspecified: Secondary | ICD-10-CM | POA: Diagnosis not present

## 2022-03-27 DIAGNOSIS — Z8673 Personal history of transient ischemic attack (TIA), and cerebral infarction without residual deficits: Secondary | ICD-10-CM

## 2022-03-27 DIAGNOSIS — J690 Pneumonitis due to inhalation of food and vomit: Secondary | ICD-10-CM | POA: Diagnosis not present

## 2022-03-27 LAB — CBC
HCT: 35.3 % — ABNORMAL LOW (ref 39.0–52.0)
Hemoglobin: 12.3 g/dL — ABNORMAL LOW (ref 13.0–17.0)
MCH: 30 pg (ref 26.0–34.0)
MCHC: 34.8 g/dL (ref 30.0–36.0)
MCV: 86.1 fL (ref 80.0–100.0)
Platelets: 164 10*3/uL (ref 150–400)
RBC: 4.1 MIL/uL — ABNORMAL LOW (ref 4.22–5.81)
RDW: 14.6 % (ref 11.5–15.5)
WBC: 7.6 10*3/uL (ref 4.0–10.5)
nRBC: 0 % (ref 0.0–0.2)

## 2022-03-27 LAB — BASIC METABOLIC PANEL
Anion gap: 9 (ref 5–15)
BUN: 14 mg/dL (ref 8–23)
CO2: 29 mmol/L (ref 22–32)
Calcium: 8.1 mg/dL — ABNORMAL LOW (ref 8.9–10.3)
Chloride: 95 mmol/L — ABNORMAL LOW (ref 98–111)
Creatinine, Ser: 1.05 mg/dL (ref 0.61–1.24)
GFR, Estimated: >60 mL/min (ref >60)
Glucose, Bld: 128 mg/dL — ABNORMAL HIGH (ref 70–99)
Potassium: 3.2 mmol/L — ABNORMAL LOW (ref 3.5–5.1)
Sodium: 133 mmol/L — ABNORMAL LOW (ref 135–145)

## 2022-03-27 LAB — GLUCOSE, CAPILLARY: Glucose-Capillary: 103 mg/dL — ABNORMAL HIGH (ref 70–99)

## 2022-03-27 LAB — HEPARIN LEVEL (UNFRACTIONATED)
Heparin Unfractionated: 0.2 IU/mL — ABNORMAL LOW (ref 0.30–0.70)
Heparin Unfractionated: 0.28 IU/mL — ABNORMAL LOW (ref 0.30–0.70)
Heparin Unfractionated: 0.34 IU/mL (ref 0.30–0.70)

## 2022-03-27 MED ORDER — HEPARIN BOLUS VIA INFUSION
1300.0000 [IU] | Freq: Once | INTRAVENOUS | Status: AC
  Administered 2022-03-27: 1300 [IU] via INTRAVENOUS
  Filled 2022-03-27: qty 1300

## 2022-03-27 MED ORDER — SODIUM CHLORIDE 0.9 % IV SOLN: INTRAVENOUS | Status: DC | PRN

## 2022-03-27 MED ORDER — FUROSEMIDE 10 MG/ML IJ SOLN
20.0000 mg | Freq: Once | INTRAMUSCULAR | Status: AC
  Administered 2022-03-27: 20 mg via INTRAVENOUS
  Filled 2022-03-27: qty 2

## 2022-03-27 MED ORDER — POTASSIUM CHLORIDE CRYS ER 20 MEQ PO TBCR: 40.0000 meq | EXTENDED_RELEASE_TABLET | Freq: Once | ORAL | Status: DC

## 2022-03-27 MED ORDER — POTASSIUM CHLORIDE CRYS ER 20 MEQ PO TBCR
40.0000 meq | EXTENDED_RELEASE_TABLET | Freq: Once | ORAL | Status: AC
  Administered 2022-03-27: 40 meq via ORAL
  Filled 2022-03-27: qty 2

## 2022-03-27 NOTE — Progress Notes (Signed)
[facility]  Daily Progress Note   Subjective  -   Johnny Mccoy is a 77 year old male following pulmonary thrombectomy on 03/26/2022.  He notes that his breathing is improved but he continues to have right flank pain.  He denies any issues currently with his groin site.  Objective Vitals:   03/27/22 1900 03/27/22 2000 03/27/22 2100 03/27/22 2200  BP: (!) 119/93 134/72 124/80 (!) 143/74  Pulse: 89 87 80 80  Resp: 18 (!) '21 14 14  '$ Temp:  98.5 F (36.9 C)    TempSrc:  Oral    SpO2: 93% 93% 93% 92%  Weight:      Height:        Intake/Output Summary (Last 24 hours) at 03/27/2022 2252 Last data filed at 03/27/2022 2200 Gross per 24 hour  Intake 1388.47 ml  Output 2450 ml  Net -1061.53 ml    PULM  CTAB CV  RRR VASC  2+ pulses bilaterally minimal edema, soft right groin.  Laboratory CBC    Component Value Date/Time   WBC 7.6 03/27/2022 0012   HGB 12.3 (L) 03/27/2022 0012   HGB 11.0 (L) 04/10/2013 1711   HCT 35.3 (L) 03/27/2022 0012   HCT 30.9 (L) 04/10/2013 1711   PLT 164 03/27/2022 0012   PLT 146 (L) 04/10/2013 1711    BMET    Component Value Date/Time   NA 133 (L) 03/27/2022 0012   NA 140 03/02/2014 0420   K 3.2 (L) 03/27/2022 0012   K 4.1 03/02/2014 0420   CL 95 (L) 03/27/2022 0012   CL 105 03/02/2014 0420   CO2 29 03/27/2022 0012   CO2 30 03/02/2014 0420   GLUCOSE 128 (H) 03/27/2022 0012   GLUCOSE 105 (H) 03/02/2014 0420   BUN 14 03/27/2022 0012   BUN 11 03/02/2014 0420   CREATININE 1.05 03/27/2022 0012   CREATININE 0.92 03/02/2014 0420   CALCIUM 8.1 (L) 03/27/2022 0012   CALCIUM 7.9 (L) 03/02/2014 0420   GFRNONAA >60 03/27/2022 0012   GFRNONAA >60 03/02/2014 0420   GFRNONAA >60 04/10/2013 1711   GFRAA >60 10/30/2015 1703   GFRAA >60 03/02/2014 0420   GFRAA >60 04/10/2013 1711    Assessment/Planning: POD #1 s/p pulmonary thrombectomy  Patient notes that shortness of breath is improved but he still has pain in his right  chest with deep inspiration. I suspect the pain is related to the underlying pneumonia that was noted on his CT scan versus a possible pulmonary infarct as a result from the pulmonary embolism.  Continue with antibiotic therapy for pneumonia as indicated by primary team.  In the instance of a pulmonary infarct-currently no further role for intervention.  Continue with heparin drip Patient is pending bilateral DVT study, pending results we can evaluate further lower extremity intervention would be helpful or necessary.  Kris Hartmann  03/27/2022, 10:52 PM

## 2022-03-27 NOTE — Plan of Care (Signed)
  Problem: Education: Goal: Knowledge of General Education information will improve Description: Including pain rating scale, medication(s)/side effects and non-pharmacologic comfort measures Outcome: Progressing   Problem: Health Behavior/Discharge Planning: Goal: Ability to manage health-related needs will improve Outcome: Progressing   Problem: Clinical Measurements: Goal: Ability to maintain clinical measurements within normal limits will improve Outcome: Progressing Goal: Will remain free from infection Outcome: Progressing Goal: Diagnostic test results will improve Outcome: Progressing Goal: Respiratory complications will improve Outcome: Progressing Goal: Cardiovascular complication will be avoided Outcome: Progressing   Problem: Activity: Goal: Risk for activity intolerance will decrease Outcome: Progressing   Problem: Nutrition: Goal: Adequate nutrition will be maintained Outcome: Progressing   Problem: Coping: Goal: Level of anxiety will decrease Outcome: Progressing   Problem: Nutrition: Goal: Adequate nutrition will be maintained Outcome: Progressing   Problem: Coping: Goal: Level of anxiety will decrease Outcome: Progressing   Problem: Elimination: Goal: Will not experience complications related to bowel motility Outcome: Progressing Goal: Will not experience complications related to urinary retention Outcome: Progressing   Problem: Pain Managment: Goal: General experience of comfort will improve Outcome: Progressing   Problem: Safety: Goal: Ability to remain free from injury will improve Outcome: Progressing   Problem: Skin Integrity: Goal: Risk for impaired skin integrity will decrease Outcome: Progressing   Problem: Education: Goal: Ability to identify signs and symptoms of gastrointestinal bleeding will improve Outcome: Progressing   Problem: Bowel/Gastric: Goal: Will show no signs and symptoms of gastrointestinal bleeding Outcome:  Progressing   Problem: Fluid Volume: Goal: Will show no signs and symptoms of excessive bleeding Outcome: Progressing   Problem: Clinical Measurements: Goal: Complications related to the disease process, condition or treatment will be avoided or minimized Outcome: Progressing

## 2022-03-27 NOTE — Progress Notes (Signed)
PROGRESS NOTE    Johnny Mccoy   DDU:[id number] DOB: 1946/02/15  DOA: 03/26/2022 Date of Service: 03/27/22 PCP: Idelle Crouch, MD    Brief Narrative / Hospital Course:  Johnny Mccoy is a 77 y.o. 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  01/25: hypoxic improved, labs and VS overall nonconcerning, CTA showed acute PE w/ R heart strain. Vascular surgery consulted, pt underwent thrombolysis/thrombectomy to R pulmonary arteries.  01/26: remains on O2, [place] for DVT pending, vascular surgery following    Consultants:  Vascular surgery   Procedures: R Pulmonary Arterial Thrombectomy and IVC placement 03/26/22      ASSESSMENT & PLAN:   Principal Problem:   Acute pulmonary embolism ([place]) Active Problems:   History of DVT of lower extremity   Aspiration pneumonia (HCC)   Bradycardia   Chronic diastolic CHF (congestive heart failure) (HCC)   Seizure (HCC)   CAD (coronary artery disease)   Hyperlipidemia   Essential hypertension   History of CVA (cerebrovascular accident)   Asthma without status asthmaticus   Hypokalemia   Lung nodule   Anxiety and depression   Acute pulmonary embolism and  history of DVT of lower extremity:  S/p Pulmonary A Thrombectomy. heparin gtt to continue for now LE dopplers ordered to evaluate for DVT pain control: When necessary Percocet and morphine  Possible aspiration pneumonia ([place]):  based on CT and hypoxia but may also be d/t PE start Unasyn (patient has received 1 dose of Zosyn in ED) Sputum culture prn albuterol nebs and mucinex  Consider deescalate abx   Bradycardia: HR 40-50s but improving check TSH  WNL tele monitoring avoid nodal blockers hold metoprolol for now    Chronic diastolic CHF (congestive heart failure) ([place]):  Continue home lasix Added IV dose today given rales and mild edema w/ SOB   Hx of Seizure Seizure precaution When necessary Ativan for seizure Continue Home medications: Lamictal   CAD (coronary artery disease) ASA and [place] Holding metoprolol as above Hold ACE/ARB given soft BP   Hyperlipidemia Rapatha   Essential hypertension IV hydralazine as needed Amlodipine Hold metoprolol due to bradycardia   History of CVA (cerebrovascular accident) Aspirin   Asthma without status asthmaticus: Stable Bronchodilators   Hypokalemia: Potassium 2.6, magnesium 2.0, phosphorus 2.6 - improved Replace and monitor potassium   Lung nodule: Incidental findings by CTA Need to follow-up with PCP as outpatient   Anxiety and depression Currently no home medications   DVT prophylaxis: n/a ppx, on heparin for PE Pertinent IV fluids/nutrition: no continuous IV fluids  Central lines / invasive devices: none  Code Status: FULL CODE   Current Admission Status: inpatient, stepdown   TOC needs / Dispo plan: TBD, anticipate d/c back home  Barriers to discharge / significant pending items: heparin gtt, pending vascular clearance and DVT study             Subjective / Brief ROS:  Patient reports short of breath about the same as when he arrived in hospital Denies CP.  Pain controlled - sore but overall okay Denies new weakness.  Tolerating diet.  Reports no concerns w/ urination/defecation.   Family Communication: Wife at bedside on rounds     Objective Findings:  Vitals:  03/27/22 0900 03/27/22 1000 03/27/22 1100 03/27/22 1200  BP:   122/71 (!) 124/56  Pulse: 75 85 82 80  Resp: 12 17 (!) 23 13  Temp: 98.4 F (36.9 C)   98.9 F (37.2 C)  TempSrc: Oral   Oral  SpO2: 93% 93% 93% 91%  Weight:      Height:        Intake/Output Summary  (Last 24 hours) at 03/27/2022 1250 Last data filed at 03/27/2022 1200 Gross per 24 hour  Intake 856.25 ml  Output 1725 ml  Net -868.75 ml   Filed Weights   03/26/22 0818 03/26/22 1650 03/26/22 1703  Weight: 88.5 kg 94.3 kg 88.5 kg    Examination:  Physical Exam Constitutional:      General: He is not in acute distress.    Appearance: Normal appearance.  Cardiovascular:     Rate and Rhythm: Normal rate and regular rhythm.     Heart sounds: Murmur heard.  Pulmonary:     Effort: Pulmonary effort is normal.     Breath sounds: Rales present. No wheezing or rhonchi.  Abdominal:     General: Abdomen is flat.     Palpations: Abdomen is soft.  Musculoskeletal:     Right lower leg: Edema present.     Left lower leg: Edema present.  Neurological:     General: No focal deficit present.     Mental Status: He is alert and oriented to person, place, and time. Mental status is at baseline.  Psychiatric:        Mood and Affect: Mood normal.        Behavior: Behavior normal.        Thought Content: Thought content normal.        Judgment: Judgment normal.          Scheduled Medications:   amLODipine  2.5 mg Oral Daily   aspirin EC  81 mg Oral Daily   Chlorhexidine Gluconate Cloth  6 each Topical Daily   fluticasone furoate-vilanterol  1 puff Inhalation Daily   furosemide  20 mg Intravenous Once   furosemide  40 mg Oral Daily   lamoTRIgine  100 mg Oral QHS   magnesium oxide  400 mg Oral Daily   montelukast  10 mg Oral QHS   pantoprazole  20 mg Oral Daily   potassium chloride  40 mEq Oral Once   sodium chloride  1 g Oral Q1400   traZODone  100 mg Oral QHS   venlafaxine XR  75 mg Oral Daily    Continuous Infusions:  sodium chloride 10 mL/hr at 03/27/22 1200   ampicillin-sulbactam (UNASYN) IV 200 mL/hr at 03/27/22 1100   heparin 1,600 Units/hr (03/27/22 1200)    PRN Medications:  sodium chloride, acetaminophen, albuterol, dextromethorphan-guaiFENesin, gabapentin,  hydrALAZINE, hydrOXYzine, LORazepam, morphine injection, nitroGLYCERIN, ondansetron (ZOFRAN) IV, oxyCODONE-acetaminophen, traZODone  Antimicrobials from admission:  Anti-infectives (From admission, onward)    Start     Dose/Rate Route Frequency Ordered Stop   03/26/22 1800  Ampicillin-Sulbactam (UNASYN) 3 g in sodium chloride 0.9 % 100 mL IVPB        3 g 200 mL/hr over 30 Minutes Intravenous Every 6 hours 03/26/22 1403     03/26/22 1309  ceFAZolin (ANCEF) IVPB 2g/100 mL premix        2 g 200 mL/hr over 30 Minutes Intravenous 30 min pre-op 03/26/22 1309 03/26/22 1545   03/26/22 1230  piperacillin-tazobactam (ZOSYN) IVPB 3.375 g  3.375 g 12.5 mL/hr over 240 Minutes Intravenous  Once 03/26/22 1215 03/26/22 1647           Data Reviewed:  I have personally reviewed the following...  CBC: Recent Labs  Lab 03/26/22 0816 03/27/22 0012  WBC 11.1* 7.6  NEUTROABS 7.7  --   HGB 14.6 12.3*  HCT 41.1 35.3*  MCV 85.1 86.1  PLT 159 876   Basic Metabolic Panel: Recent Labs  Lab 03/26/22 0816 03/26/22 0820 03/26/22 1038 03/27/22 0012  NA 131*  --   --  133*  K 2.6*  --   --  3.2*  CL 91*  --   --  95*  CO2 29  --   --  29  GLUCOSE 124*  --   --  128*  BUN 11  --   --  14  CREATININE 1.06  --   --  1.05  CALCIUM 8.7*  --   --  8.1*  MG  --  2.0  --   --   PHOS  --   --  2.6  --    GFR: Estimated Creatinine Clearance: 65.7 mL/min (by C-G formula based on SCr of 1.05 mg/dL). Liver Function Tests: Recent Labs  Lab 03/26/22 0816  AST 18  ALT 13  ALKPHOS 42  BILITOT 1.5*  PROT 7.3  ALBUMIN 3.9   Recent Labs  Lab 03/26/22 1038  LIPASE 34   No results for input(s): "AMMONIA" in the last 168 hours. Coagulation Profile: Recent Labs  Lab 03/26/22 0821  INR 1.1   Cardiac Enzymes: No results for input(s): "CKTOTAL", "CKMB", "CKMBINDEX", "TROPONINI" in the last 168 hours. BNP (last 3 results) No results for input(s): "PROBNP" in the last 8760  hours. HbA1C: No results for input(s): "HGBA1C" in the last 72 hours. CBG: Recent Labs  Lab 03/27/22 0733  GLUCAP 103*   Lipid Profile: No results for input(s): "CHOL", "HDL", "LDLCALC", "TRIG", "CHOLHDL", "LDLDIRECT" in the last 72 hours. Thyroid Function Tests: Recent Labs    03/26/22 1038  TSH 2.787   Anemia Panel: No results for input(s): "VITAMINB12", "FOLATE", "FERRITIN", "TIBC", "IRON", "RETICCTPCT" in the last 72 hours. Most Recent Urinalysis On File:     Component Value Date/Time   COLORURINE AMBER (A) 03/26/2022 0820   APPEARANCEUR HAZY (A) 03/26/2022 0820   APPEARANCEUR Clear 07/03/2020 1007   LABSPEC 1.017 03/26/2022 0820   LABSPEC 1.011 03/22/2013 0840   PHURINE 6.0 03/26/2022 0820   GLUCOSEU NEGATIVE 03/26/2022 0820   GLUCOSEU Negative 03/22/2013 0840   HGBUR NEGATIVE 03/26/2022 0820   BILIRUBINUR NEGATIVE 03/26/2022 0820   BILIRUBINUR Negative 07/03/2020 1007   BILIRUBINUR Negative 03/22/2013 0840   KETONESUR 20 (A) 03/26/2022 0820   PROTEINUR 30 (A) 03/26/2022 0820   NITRITE NEGATIVE 03/26/2022 0820   LEUKOCYTESUR NEGATIVE 03/26/2022 0820   LEUKOCYTESUR Negative 03/22/2013 0840   Sepsis Labs: '@LABRCNTIP'$ (procalcitonin:4,lacticidven:4) Microbiology: Recent Results (from the past 240 hour(s))  MRSA Next Gen by PCR, Nasal     Status: None   Collection Time: 03/26/22  4:55 PM   Specimen: Nasal Mucosa; Nasal Swab  Result Value Ref Range Status   MRSA by PCR Next Gen NOT DETECTED NOT DETECTED Final    Comment: (NOTE) The GeneXpert MRSA Assay (FDA approved for NASAL specimens only), is one component of a comprehensive MRSA colonization surveillance program. It is not intended to diagnose MRSA infection nor to guide or monitor treatment for MRSA infections. Test performance is not FDA approved in patients less  than 27 years old. Performed at [facility], [address], [place], [place] [address]       Radiology Studies last 3  days: PERIPHERAL VASCULAR CATHETERIZATION  Result Date: 03/26/2022 See surgical note for result.  CT Abdomen Pelvis W Contrast  Result Date: 03/26/2022 CLINICAL DATA:  Pulmonary embolism (PE) suspected, high prob R sided pleurisy; RLQ abdominal pain EXAM: CT ANGIOGRAPHY CHEST CT ABDOMEN AND PELVIS WITH CONTRAST TECHNIQUE: Multidetector CT imaging of the chest was performed using the standard protocol during bolus administration of intravenous contrast. Multiplanar CT image reconstructions and MIPs were obtained to evaluate the vascular anatomy. Multidetector CT imaging of the abdomen and pelvis was performed using the standard protocol during bolus administration of intravenous contrast. RADIATION DOSE REDUCTION: This exam was performed according to the departmental dose-optimization program which includes automated exposure control, adjustment of the mA and/or kV according to patient size and/or use of iterative reconstruction technique. CONTRAST:  120m OMNIPAQUE IOHEXOL 350 MG/ML SOLN COMPARISON:  Chest CT 05/29/2021 FINDINGS: CTA CHEST FINDINGS Cardiovascular: Satisfactory opacification of the pulmonary arteries to the segmental level. Central filling defect in the distal right pulmonary artery extending into the lobar arteries, segmental branches of the right upper and lower lobes, and subsegmental branches of the right lower lobe. RV:LV ratio measures 1.2.No pericardial disease. Coronary artery atherosclerosis. Mitral annular and aortic valve calcifications. Scattered atherosclerosis of the thoracic aorta. Mediastinum/Nodes: No lymphadenopathy. The thyroid is unremarkable. Lungs/Pleura: There is diffuse bronchial wall thickening, lower lung predominant. There is interlobular septal thickening and ground-glass opacities bilaterally with small right pleural effusion and bibasilar atelectasis. There is peribronchovascular consolidation in the right lower lobe (series 6, image 225). There are multiple  pulmonary nodules as seen on multiple prior chest CTs, some of which are obscured by acute pulmonary disease. Nodules in the right lower, right middle, and right upper lobe measure up to 4-5 mm (series 6, images 205, 191, and 145 respectively). Musculoskeletal: No acute osseous abnormality. No suspicious osseous lesion. Review of the MIP images confirms the above findings. CT ABDOMEN and PELVIS FINDINGS Hepatobiliary: No focal liver abnormality is seen. The gallbladder is unremarkable. Pancreas: Unremarkable. No pancreatic ductal dilatation or surrounding inflammatory changes. Spleen: Normal in size without focal abnormality. Adrenals/Urinary Tract: Adrenal glands are unremarkable. No hydronephrosis or nephrolithiasis. The bladder is moderately distended. Stomach/Bowel: The stomach is within normal limits. There is no evidence of bowel obstruction.The appendix is normal. Vascular/Lymphatic: Aortoiliac atherosclerosis. No AAA. No lymphadenopathy. Reproductive: Mildly enlarged prostate gland. Other: Bilateral fat containing inguinal hernias, right greater than left. Musculoskeletal: No acute osseous abnormality. No suspicious osseous lesion. Prior lumbosacral and right SI joint fusion without evidence of hardware complication. Review of the MIP images confirms the above findings. IMPRESSION: Positive for acute pulmonary emboli in the distal right pulmonary artery, extending into the lobar, segmental, and subsegmental branches, and with CT evidence of right heart strain. Pulmonary edema with small right pleural effusion and bibasilar atelectasis. Additional peribronchovascular consolidation in the right lower lung with adjacent endobronchial material in the right lower lobe bronchi, potentially pneumonia and/or aspiration. Multiple pulmonary nodules as seen on prior exams, some of which are obscured by current pulmonary disease, those which are visible measuring 4-5 mm and unchanged from prior exam in March 2023.  Recommend follow-up chest CT after resolution of acute illness for adequate comparison. No acute findings in the abdomen or pelvis. Critical Value/emergent results were called by telephone at the time of interpretation on 03/26/2022 at 11:28 am to  provider Valora Piccolo, who verbally acknowledged these results. Electronically Signed   By: Maurine Simmering M.D.   On: 03/26/2022 11:39   CT Angio Chest PE W and/or Wo Contrast  Result Date: 03/26/2022 CLINICAL DATA:  Pulmonary embolism (PE) suspected, high prob R sided pleurisy; RLQ abdominal pain EXAM: CT ANGIOGRAPHY CHEST CT ABDOMEN AND PELVIS WITH CONTRAST TECHNIQUE: Multidetector CT imaging of the chest was performed using the standard protocol during bolus administration of intravenous contrast. Multiplanar CT image reconstructions and MIPs were obtained to evaluate the vascular anatomy. Multidetector CT imaging of the abdomen and pelvis was performed using the standard protocol during bolus administration of intravenous contrast. RADIATION DOSE REDUCTION: This exam was performed according to the departmental dose-optimization program which includes automated exposure control, adjustment of the mA and/or kV according to patient size and/or use of iterative reconstruction technique. CONTRAST:  146m OMNIPAQUE IOHEXOL 350 MG/ML SOLN COMPARISON:  Chest CT 05/29/2021 FINDINGS: CTA CHEST FINDINGS Cardiovascular: Satisfactory opacification of the pulmonary arteries to the segmental level. Central filling defect in the distal right pulmonary artery extending into the lobar arteries, segmental branches of the right upper and lower lobes, and subsegmental branches of the right lower lobe. RV:LV ratio measures 1.2.No pericardial disease. Coronary artery atherosclerosis. Mitral annular and aortic valve calcifications. Scattered atherosclerosis of the thoracic aorta. Mediastinum/Nodes: No lymphadenopathy. The thyroid is unremarkable. Lungs/Pleura: There is diffuse bronchial wall  thickening, lower lung predominant. There is interlobular septal thickening and ground-glass opacities bilaterally with small right pleural effusion and bibasilar atelectasis. There is peribronchovascular consolidation in the right lower lobe (series 6, image 225). There are multiple pulmonary nodules as seen on multiple prior chest CTs, some of which are obscured by acute pulmonary disease. Nodules in the right lower, right middle, and right upper lobe measure up to 4-5 mm (series 6, images 205, 191, and 145 respectively). Musculoskeletal: No acute osseous abnormality. No suspicious osseous lesion. Review of the MIP images confirms the above findings. CT ABDOMEN and PELVIS FINDINGS Hepatobiliary: No focal liver abnormality is seen. The gallbladder is unremarkable. Pancreas: Unremarkable. No pancreatic ductal dilatation or surrounding inflammatory changes. Spleen: Normal in size without focal abnormality. Adrenals/Urinary Tract: Adrenal glands are unremarkable. No hydronephrosis or nephrolithiasis. The bladder is moderately distended. Stomach/Bowel: The stomach is within normal limits. There is no evidence of bowel obstruction.The appendix is normal. Vascular/Lymphatic: Aortoiliac atherosclerosis. No AAA. No lymphadenopathy. Reproductive: Mildly enlarged prostate gland. Other: Bilateral fat containing inguinal hernias, right greater than left. Musculoskeletal: No acute osseous abnormality. No suspicious osseous lesion. Prior lumbosacral and right SI joint fusion without evidence of hardware complication. Review of the MIP images confirms the above findings. IMPRESSION: Positive for acute pulmonary emboli in the distal right pulmonary artery, extending into the lobar, segmental, and subsegmental branches, and with CT evidence of right heart strain. Pulmonary edema with small right pleural effusion and bibasilar atelectasis. Additional peribronchovascular consolidation in the right lower lung with adjacent  endobronchial material in the right lower lobe bronchi, potentially pneumonia and/or aspiration. Multiple pulmonary nodules as seen on prior exams, some of which are obscured by current pulmonary disease, those which are visible measuring 4-5 mm and unchanged from prior exam in March 2023. Recommend follow-up chest CT after resolution of acute illness for adequate comparison. No acute findings in the abdomen or pelvis. Critical Value/emergent results were called by telephone at the time of interpretation on 03/26/2022 at 11:28 am to [facility] who verbally acknowledged these results. Electronically Signed   By: JEdison Nasuti  Chancy Milroy M.D.   On: 03/26/2022 11:39             LOS: 1 day     Emeterio Reeve, DO [facility] 03/27/2022, 12:50 PM    Dictation software may have been used to generate the above note. Typos may occur and escape review in typed/dictated notes. Please contact Dr Sheppard Coil directly for clarity if needed.  Staff may message me via secure chat in [place]  but this may not receive an immediate response,  please page me for urgent matters!  If 7PM-7AM, please contact night coverage [email or web address]

## 2022-03-27 NOTE — Consult Note (Signed)
ANTICOAGULATION CONSULT NOTE - Initial Consult  Pharmacy Consult for heparin infusion Indication: pulmonary embolus  Allergies  Allergen Reactions   Fentanyl Other (See Comments)    Possible involuntary tremors Other reaction(s): Other (See Comments) Involuntary tremors. Patient said this was looked into and it was not the cause of the tremors   Methocarbamol Other (See Comments)    confusion    Patient Measurements: Height: '5\' 8"'$  (172.7 cm) Weight: 88.5 kg (195 lb) IBW/kg (Calculated) : 68.4 Heparin Dosing Weight: 86.4 kg   Vital Signs: Temp: 98.9 F (37.2 C) (01/26 1200) Temp Source: Oral (01/26 1200) BP: 131/75 (01/26 1500) Pulse Rate: 87 (01/26 1500)  Labs: Recent Labs    03/26/22 0816 03/26/22 0820 03/26/22 0821 03/27/22 0012 03/27/22 1144  HGB 14.6  --   --  12.3*  --   HCT 41.1  --   --  35.3*  --   PLT 159  --   --  164  --   APTT  --   --  33  --   --   LABPROT  --   --  14.3  --   --   INR  --   --  1.1  --   --   HEPARINUNFRC  --   --   --  0.20* 0.28*  CREATININE 1.06  --   --  1.05  --   TROPONINIHS  --  17  --   --   --      Estimated Creatinine Clearance: 65.7 mL/min (by C-G formula based on SCr of 1.05 mg/dL).   Medical History: Past Medical History:  Diagnosis Date   (HFpEF) heart failure with preserved ejection fraction ([place]) 02/27/2014   a.) TTE 02/27/14: EF >55%; mod LVH; mild panvalvular regurgitation; (+) global stress induced HK. b.) TTE 09/20/14: EF 55%; triv AR/MR, mild TR/PR; mild AS; BAE; c.) TTE 06/20/15: EF 55%, triv AR/MR; BAE; mild AS. d.) TTE 02/11/17: EF 55; significant  RV dilation with mod systolic dysfunction; mild AS. e.) TTE 02/27/19: EF 55%; mild AS. f.) TTE 06/04/20: EF 50%; mild AS   Anxiety    Aortic stenosis 09/20/2014   a.) TTE 09/20/2014: mild; MPG?. b.) TTE 06/20/2015: mild; MPG 8.0 mmHg. c.) TTE 02/11/2017: mild; MPG 12.4 mmHg. d.) TTE 02/27/2019: mild; MPG 11.4 mmHg. e.) TTE 06/04/2020: mild; MPG 9.6 mmHg.    Arthritis    Asthma    CAD (coronary artery disease)    Cataracts, bilateral    Chronic back pain    Chronic venous insufficiency    Complication of anesthesia    a.) postoperative amnesia (1999). b.) seizures after partial knee in 2014; sent to [facility] x 5 days d/t uncontrollable shaking and AMS; ?? fentanyl induced tremors. c.) anesthesia awareness   COVID-19 05/2018   CVA (cerebral vascular accident) [facility])    Depressed    Essential tremor    GERD (gastroesophageal reflux disease)    Hepatitis B    a.) h/o in the 90s; reports not active at present   History of bronchitis    Jan-Mar 2017   History of hiatal hernia    Hyperlipidemia    Hypertension    Insomnia    Joint pain    Lymphedema    Mild cognitive impairment    Muscle spasm    in neck.Takes Flexeril as needed   Numbness    right knee,right hand,and toes on left   RBBB    Seizures ([place])  [place]. takes Lamotrigine daily. Last seizure 6+ yrs ago   Sleep apnea with use of continuous positive airway pressure (CPAP)    VTE (venous thromboembolism)    a.) s/p RIGHT TKA    Medications:  No prior anticoagulation noted   Assessment: 77 y.o. male history of coronary disease,  stroke, multiple surgeries, previous pulmonary embolism and DVTs presented to [facility] ED 03/26/22 with rib pain and dizziness. Found to have acute PE with right heart strain. Pharmacy consulted to initiate and manage IV heparin infusion.   Goal of Therapy:  Heparin level 0.3-0.7 units/ml Monitor platelets by anticoagulation protocol: Yes   Plan:  1/26:  HL @ 1144 = 0.28, SUBtherapeutic  Will order heparin 1300 units IV X 1 bolus and increase drip rate to 1800 units/hr. Will recheck HL 8 hrs after rate change.  Continue to monitor H&H and platelets  Pearla Dubonnet, PharmD Clinical Pharmacist   03/27/2022,3:30 PM

## 2022-03-27 NOTE — Consult Note (Signed)
ANTICOAGULATION CONSULT NOTE - Initial Consult  Pharmacy Consult for heparin infusion Indication: pulmonary embolus  Allergies  Allergen Reactions   Fentanyl Other (See Comments)    Possible involuntary tremors Other reaction(s): Other (See Comments) Involuntary tremors. Patient said this was looked into and it was not the cause of the tremors   Methocarbamol Other (See Comments)    confusion    Patient Measurements: Height: '5\' 8"'$  (172.7 cm) Weight: 88.5 kg (195 lb) IBW/kg (Calculated) : 68.4 Heparin Dosing Weight: 86.4 kg   Vital Signs: Temp: 98.5 F (36.9 C) (01/26 2000) Temp Source: Oral (01/26 2000) BP: 143/74 (01/26 2200) Pulse Rate: 80 (01/26 2200)  Labs: Recent Labs    03/26/22 0816 03/26/22 0820 03/26/22 0821 03/27/22 0012 03/27/22 1144 03/27/22 2127  HGB 14.6  --   --  12.3*  --   --   HCT 41.1  --   --  35.3*  --   --   PLT 159  --   --  164  --   --   APTT  --   --  33  --   --   --   LABPROT  --   --  14.3  --   --   --   INR  --   --  1.1  --   --   --   HEPARINUNFRC  --   --   --  0.20* 0.28* 0.34  CREATININE 1.06  --   --  1.05  --   --   TROPONINIHS  --  17  --   --   --   --      Estimated Creatinine Clearance: 65.7 mL/min (by C-G formula based on SCr of 1.05 mg/dL).   Medical History: Past Medical History:  Diagnosis Date   (HFpEF) heart failure with preserved ejection fraction ([place]) 02/27/2014   a.) TTE 02/27/14: EF >55%; mod LVH; mild panvalvular regurgitation; (+) global stress induced HK. b.) TTE 09/20/14: EF 55%; triv AR/MR, mild TR/PR; mild AS; BAE; c.) TTE 06/20/15: EF 55%, triv AR/MR; BAE; mild AS. d.) TTE 02/11/17: EF 55; significant  RV dilation with mod systolic dysfunction; mild AS. e.) TTE 02/27/19: EF 55%; mild AS. f.) TTE 06/04/20: EF 50%; mild AS   Anxiety    Aortic stenosis 09/20/2014   a.) TTE 09/20/2014: mild; MPG?. b.) TTE 06/20/2015: mild; MPG 8.0 mmHg. c.) TTE 02/11/2017: mild; MPG 12.4 mmHg. d.) TTE 02/27/2019: mild;  MPG 11.4 mmHg. e.) TTE 06/04/2020: mild; MPG 9.6 mmHg.   Arthritis    Asthma    CAD (coronary artery disease)    Cataracts, bilateral    Chronic back pain    Chronic venous insufficiency    Complication of anesthesia    a.) postoperative amnesia (1999). b.) seizures after partial knee in 2014; sent to [facility] x 5 days d/t uncontrollable shaking and AMS; ?? fentanyl induced tremors. c.) anesthesia awareness   COVID-19 05/2018   CVA (cerebral vascular accident) [facility])    Depressed    Essential tremor    GERD (gastroesophageal reflux disease)    Hepatitis B    a.) h/o in the 90s; reports not active at present   History of bronchitis    Jan-Mar 2017   History of hiatal hernia    Hyperlipidemia    Hypertension    Insomnia    Joint pain    Lymphedema    Mild cognitive impairment    Muscle spasm    in  neck.Takes Flexeril as needed   Numbness    right knee,right hand,and toes on left   RBBB    Seizures (HCC)    [place]. takes Lamotrigine daily. Last seizure 6+ yrs ago   Sleep apnea with use of continuous positive airway pressure (CPAP)    VTE (venous thromboembolism)    a.) s/p RIGHT TKA    Medications:  No prior anticoagulation noted   Assessment: 77 y.o. male history of coronary disease,  stroke, multiple surgeries, previous pulmonary embolism and DVTs presented to [facility] ED 03/26/22 with rib pain and dizziness. Found to have acute PE with right heart strain. Pharmacy consulted to initiate and manage IV heparin infusion.   Goal of Therapy:  Heparin level 0.3-0.7 units/ml Monitor platelets by anticoagulation protocol: Yes   1/26:  HL @ 1144 = 0.28, SUBtherapeutic  Date Time HL Rate/Comment 1/26 1144 0.28 Subtherapeutic 1600 > 1800 u/hr 1/26 2127 0.34 Therapeutic x1   Plan:  Conitinue heparin rate at 1800 units/hr. Will recheck HL 8 hrs after rate change.  Continue to monitor H&H and platelets  Darrick Penna, PharmD Clinical Pharmacist   03/27/2022,10:05 PM

## 2022-03-27 NOTE — Progress Notes (Signed)
1930 patient alert on [place] sitting in chair 2000 patient back in bed complaints of pain with deep breathing PRN medications given

## 2022-03-27 NOTE — Hospital Course (Addendum)
Johnny Mccoy is a 77 y.o. 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  01/25: hypoxic improved, labs and VS overall nonconcerning, CTA showed acute PE w/ R heart strain. Vascular surgery consulted, pt underwent thrombolysis/thrombectomy to R pulmonary arteries.  01/26: remains on O2, [place] for DVT pending, vascular surgery following    Consultants:  Vascular surgery   Procedures: R Pulmonary Arterial Thrombectomy and IVC placement 03/26/22      ASSESSMENT & PLAN:   Principal Problem:   Acute pulmonary embolism ([place]) Active Problems:   History of DVT of lower extremity   Aspiration pneumonia (HCC)   Bradycardia   Chronic diastolic CHF (congestive heart failure) (HCC)   Seizure (HCC)   CAD (coronary artery disease)   Hyperlipidemia   Essential hypertension   History of CVA (cerebrovascular accident)   Asthma without status asthmaticus   Hypokalemia   Lung nodule   Anxiety and depression   Acute pulmonary embolism and  history of DVT of lower extremity:  S/p Pulmonary A Thrombectomy. heparin gtt to continue for now LE dopplers ordered to evaluate for DVT pain control: When necessary Percocet and morphine  Possible aspiration pneumonia ([place]):  based on CT and hypoxia but may also be d/t PE start Unasyn (patient has received 1 dose of Zosyn in ED) Sputum culture prn albuterol nebs and mucinex  Consider deescalate abx   Bradycardia: HR 40-50s but improving check TSH WNL tele monitoring avoid nodal blockers hold metoprolol for now    Chronic diastolic CHF (congestive heart failure) ([place]):  Continue home lasix Added IV dose today given rales and  mild edema w/ SOB   Hx of Seizure Seizure precaution When necessary Ativan for seizure Continue Home medications: Lamictal   CAD (coronary artery disease) ASA and [place] Holding metoprolol as above Hold ACE/ARB given soft BP   Hyperlipidemia [place]   Essential hypertension IV hydralazine as needed Amlodipine Hold metoprolol due to bradycardia   History of CVA (cerebrovascular accident) Aspirin   Asthma without status asthmaticus: Stable Bronchodilators   Hypokalemia: Potassium 2.6, magnesium 2.0, phosphorus 2.6 - improved Replace and monitor potassium   Lung nodule: Incidental findings by CTA Need to follow-up with PCP as outpatient   Anxiety and depression Currently no home medications   DVT prophylaxis: n/a ppx, on heparin for PE Pertinent IV fluids/nutrition: no continuous IV fluids  Central lines / invasive devices: none  Code Status: FULL CODE   Current Admission Status: inpatient, stepdown   TOC needs / Dispo plan: TBD, anticipate d/c back home  Barriers to discharge / significant pending items: heparin gtt, pending vascular clearance and DVT study

## 2022-03-27 NOTE — Consult Note (Signed)
ANTICOAGULATION CONSULT NOTE - Initial Consult  Pharmacy Consult for heparin infusion Indication: pulmonary embolus  Allergies  Allergen Reactions   Fentanyl Other (See Comments)    Possible involuntary tremors Other reaction(s): Other (See Comments) Involuntary tremors. Patient said this was looked into and it was not the cause of the tremors   Methocarbamol Other (See Comments)    confusion    Patient Measurements: Height: '5\' 8"'$  (172.7 cm) Weight: 88.5 kg (195 lb) IBW/kg (Calculated) : 68.4 Heparin Dosing Weight: 86.4 kg   Vital Signs: Temp: 99.2 F (37.3 C) (01/25 2351) Temp Source: Oral (01/25 2351) BP: 166/76 (01/25 2351) Pulse Rate: 88 (01/25 2351)  Labs: Recent Labs    03/26/22 0816 03/26/22 0820 03/26/22 0821 03/27/22 0012  HGB 14.6  --   --  12.3*  HCT 41.1  --   --  35.3*  PLT 159  --   --  164  APTT  --   --  33  --   LABPROT  --   --  14.3  --   INR  --   --  1.1  --   HEPARINUNFRC  --   --   --  0.20*  CREATININE 1.06  --   --  1.05  TROPONINIHS  --  17  --   --      Estimated Creatinine Clearance: 65.7 mL/min (by C-G formula based on SCr of 1.05 mg/dL).   Medical History: Past Medical History:  Diagnosis Date   (HFpEF) heart failure with preserved ejection fraction ([place]) 02/27/2014   a.) TTE 02/27/14: EF >55%; mod LVH; mild panvalvular regurgitation; (+) global stress induced HK. b.) TTE 09/20/14: EF 55%; triv AR/MR, mild TR/PR; mild AS; BAE; c.) TTE 06/20/15: EF 55%, triv AR/MR; BAE; mild AS. d.) TTE 02/11/17: EF 55; significant  RV dilation with mod systolic dysfunction; mild AS. e.) TTE 02/27/19: EF 55%; mild AS. f.) TTE 06/04/20: EF 50%; mild AS   Anxiety    Aortic stenosis 09/20/2014   a.) TTE 09/20/2014: mild; MPG?. b.) TTE 06/20/2015: mild; MPG 8.0 mmHg. c.) TTE 02/11/2017: mild; MPG 12.4 mmHg. d.) TTE 02/27/2019: mild; MPG 11.4 mmHg. e.) TTE 06/04/2020: mild; MPG 9.6 mmHg.   Arthritis    Asthma    CAD (coronary artery disease)     Cataracts, bilateral    Chronic back pain    Chronic venous insufficiency    Complication of anesthesia    a.) postoperative amnesia (1999). b.) seizures after partial knee in 2014; sent to [facility] x 5 days d/t uncontrollable shaking and AMS; ?? fentanyl induced tremors. c.) anesthesia awareness   COVID-19 05/2018   CVA (cerebral vascular accident) [facility])    Depressed    Essential tremor    GERD (gastroesophageal reflux disease)    Hepatitis B    a.) h/o in the 90s; reports not active at present   History of bronchitis    Jan-Mar 2017   History of hiatal hernia    Hyperlipidemia    Hypertension    Insomnia    Joint pain    Lymphedema    Mild cognitive impairment    Muscle spasm    in neck.Takes Flexeril as needed   Numbness    right knee,right hand,and toes on left   RBBB    Seizures (HCC)    [place]. takes Lamotrigine daily. Last seizure 6+ yrs ago   Sleep apnea with use of continuous positive airway pressure (CPAP)    VTE (venous  thromboembolism)    a.) s/p RIGHT TKA    Medications:  No prior anticoagulation noted   Assessment: 77 y.o. male history of coronary disease,  stroke, multiple surgeries, previous pulmonary embolism and DVTs presented to [facility] ED 03/26/22 with rib pain and dizziness. Found to have acute PE with right heart strain. Pharmacy consulted to initiate and manage IV heparin infusion.   Goal of Therapy:  Heparin level 0.3-0.7 units/ml Monitor platelets by anticoagulation protocol: Yes   Plan:  1/26:  HL @ 0012 = 0.20, SUBtherapeutic  Will order heparin 1300 units IV X 1 bolus and increase drip rate to 1600 units/hr. Will recheck HL 8 hrs after rate change.  Continue to monitor H&H and platelets  Samuell Knoble D, PharmD Clinical Pharmacist   03/27/2022,12:53 AM

## 2022-03-28 DIAGNOSIS — J690 Pneumonitis due to inhalation of food and vomit: Secondary | ICD-10-CM | POA: Diagnosis not present

## 2022-03-28 DIAGNOSIS — I2609 Other pulmonary embolism with acute cor pulmonale: Secondary | ICD-10-CM | POA: Diagnosis not present

## 2022-03-28 DIAGNOSIS — I2510 Atherosclerotic heart disease of native coronary artery without angina pectoris: Secondary | ICD-10-CM | POA: Diagnosis not present

## 2022-03-28 DIAGNOSIS — Z01810 Encounter for preprocedural cardiovascular examination: Secondary | ICD-10-CM

## 2022-03-28 DIAGNOSIS — E785 Hyperlipidemia, unspecified: Secondary | ICD-10-CM | POA: Diagnosis not present

## 2022-03-28 LAB — HEPARIN LEVEL (UNFRACTIONATED): Heparin Unfractionated: 0.53 IU/mL (ref 0.30–0.70)

## 2022-03-28 LAB — BASIC METABOLIC PANEL
Anion gap: 10 (ref 5–15)
BUN: 11 mg/dL (ref 8–23)
CO2: 30 mmol/L (ref 22–32)
Calcium: 8.5 mg/dL — ABNORMAL LOW (ref 8.9–10.3)
Chloride: 96 mmol/L — ABNORMAL LOW (ref 98–111)
Creatinine, Ser: 0.77 mg/dL (ref 0.61–1.24)
GFR, Estimated: >60 mL/min (ref >60)
Glucose, Bld: 109 mg/dL — ABNORMAL HIGH (ref 70–99)
Potassium: 3.2 mmol/L — ABNORMAL LOW (ref 3.5–5.1)
Sodium: 136 mmol/L (ref 135–145)

## 2022-03-28 LAB — GLUCOSE, CAPILLARY: Glucose-Capillary: 109 mg/dL — ABNORMAL HIGH (ref 70–99)

## 2022-03-28 MED ORDER — POTASSIUM CHLORIDE CRYS ER 20 MEQ PO TBCR
40.0000 meq | EXTENDED_RELEASE_TABLET | Freq: Two times a day (BID) | ORAL | Status: AC
  Administered 2022-03-28 (×2): 40 meq via ORAL
  Filled 2022-03-28 (×2): qty 2

## 2022-03-28 MED ORDER — APIXABAN 5 MG PO TABS
10.0000 mg | ORAL_TABLET | Freq: Two times a day (BID) | ORAL | Status: DC
  Administered 2022-03-28 – 2022-03-29 (×3): 10 mg via ORAL
  Filled 2022-03-28 (×3): qty 2

## 2022-03-28 MED ORDER — APIXABAN 5 MG PO TABS: 5.0000 mg | ORAL_TABLET | Freq: Two times a day (BID) | ORAL | Status: DC

## 2022-03-28 MED ORDER — POTASSIUM CHLORIDE 10 MEQ/100ML IV SOLN
10.0000 meq | INTRAVENOUS | Status: DC
  Administered 2022-03-28: 10 meq via INTRAVENOUS
  Filled 2022-03-28 (×4): qty 100

## 2022-03-28 MED ORDER — FUROSEMIDE 10 MG/ML IJ SOLN
40.0000 mg | Freq: Once | INTRAMUSCULAR | Status: AC
  Administered 2022-03-28: 40 mg via INTRAVENOUS
  Filled 2022-03-28: qty 4

## 2022-03-28 MED ORDER — LAMOTRIGINE 25 MG PO TABS
100.0000 mg | ORAL_TABLET | Freq: Two times a day (BID) | ORAL | Status: DC
  Administered 2022-03-28 – 2022-03-29 (×2): 100 mg via ORAL
  Filled 2022-03-28 (×2): qty 4

## 2022-03-28 NOTE — Progress Notes (Addendum)
1930 patient alert on 3L [place] sitting in chair no pain noted  2250 patient 02 decreased when sleeping increase 02 but patient mouth is open s increasing 02 is not helping

## 2022-03-28 NOTE — Plan of Care (Signed)

## 2022-03-28 NOTE — Progress Notes (Signed)
PROGRESS NOTE    Johnny Mccoy   XBJ:[id number] DOB: [birth year]  DOA: 03/26/2022 Date of Service: 03/28/22 PCP: Idelle Crouch, MD    Brief Narrative / Hospital Course:  Johnny Mccoy is a 77 y.o. 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  01/25: hypoxic improved, labs and VS overall nonconcerning, CTA showed acute PE w/ R heart strain. Vascular surgery consulted, pt underwent thrombolysis/thrombectomy to R pulmonary arteries.  01/26: Remains on O2 4L, [place] for DVT neg 01/27: Remains on O2 3L, good UOP yesterday, repeat lasix dose today, transition off heparin to po DOAC.   Consultants:  Vascular surgery   Procedures: R Pulmonary Arterial Thrombectomy and IVC placement 03/26/22      ASSESSMENT & PLAN:   Principal Problem:   Acute pulmonary embolism (HCC) Active Problems:   History of DVT of lower extremity   Aspiration pneumonia (HCC)   Bradycardia   Chronic diastolic CHF (congestive heart failure) (HCC)   Seizure (HCC)   CAD (coronary artery disease)   Hyperlipidemia   Essential hypertension   History of CVA (cerebrovascular accident)   Asthma without status asthmaticus   Hypokalemia   Lung nodule   Anxiety and depression   Acute pulmonary embolism and  history of DVT of lower extremity:  S/p Pulmonary A Thrombectomy and IVC placement  DVT r/o  heparin gtt t--> Eliquis pain control: When necessary Percocet and morphine  Acute hypoxic respiratory failure  Possible aspiration pneumonia (Johnny Mccoy):  based on CT and hypoxia but may also be d/t PE / fluid overload start Unasyn (patient has received 1 dose of Zosyn in ED) Sputum culture prn  albuterol nebs and mucinex  Consider deescalate abx  Supplemental O2 wean as able   Bradycardia: HR 40-50s but improving check TSH WNL tele monitoring avoid nodal blockers hold metoprolol for now    Chronic diastolic CHF (congestive heart failure) ([place]):  Mild fluid overload now / Cor pulmonale d/t PE IV lasix daily I&O Monitor BMP    Hx of Seizure Seizure precaution When necessary Ativan for seizure Continue Home medications: Lamictal   CAD (coronary artery disease) ASA and Rapatha Holding metoprolol as above Hold ACE/ARB given soft BP   Hyperlipidemia Rapatha   Essential hypertension IV hydralazine as needed Amlodipine Hold metoprolol due to bradycardia   History of CVA (cerebrovascular accident) Aspirin   Asthma without status asthmaticus: Stable Bronchodilators   Hypokalemia: Potassium 2.6, magnesium 2.0, phosphorus 2.6 - improved Replace and monitor potassium   Lung nodule: Incidental findings by CTA Need to follow-up with PCP as outpatient   Anxiety and depression Asked pharmacy to review home meds - list is not accurate per patient but he cannot recal exact doses of meds / all meds    DVT prophylaxis: Eliquis Pertinent IV fluids/nutrition: no continuous IV fluids  Central lines / invasive devices: none  Code Status: FULL CODE   Current Admission Status: inpatient, med surg tele   TOC needs / Dispo plan: TBD, anticipate d/c back home  Barriers to discharge / significant pending items: diuresing, O2 requirement, anticipate d/c home tomorrow or day after              Subjective / Brief ROS:  Patient reports SOB improved Denies  CP.  Pain controlled - sore but overall okay Denies new weakness.  Tolerating diet.  Reports no concerns w/ urination/defecation.   Family Communication: none at this time     Objective Findings:  Vitals:   03/28/22 0500 03/28/22 0518 03/28/22 0800 03/28/22 1200  BP:   (!) 146/69 139/68  Pulse:   73 93   Resp:   16 (!) 21  Temp:  (!) 97.4 F (36.3 C) 97.6 F (36.4 C) 97.9 F (36.6 C)  TempSrc:  Oral Oral Oral  SpO2:   92% 94%  Weight: 75.2 kg     Height:        Intake/Output Summary (Last 24 hours) at 03/28/2022 1302 Last data filed at 03/28/2022 0800 Gross per 24 hour  Intake 651.41 ml  Output 500 ml  Net 151.41 ml    Filed Weights   03/26/22 1650 03/26/22 1703 03/28/22 0500  Weight: 94.3 kg 88.5 kg 75.2 kg    Examination:  Physical Exam Constitutional:      General: He is not in acute distress.    Appearance: Normal appearance.  Cardiovascular:     Rate and Rhythm: Normal rate and regular rhythm.     Heart sounds: Murmur heard.  Pulmonary:     Effort: Pulmonary effort is normal.     Breath sounds: Rales present. No wheezing or rhonchi.  Abdominal:     General: Abdomen is flat.     Palpations: Abdomen is soft.  Musculoskeletal:     Right lower leg: Edema present.     Left lower leg: Edema present.  Neurological:     General: No focal deficit present.     Mental Status: He is alert and oriented to person, place, and time. Mental status is at baseline.  Psychiatric:        Mood and Affect: Mood normal.        Behavior: Behavior normal.        Thought Content: Thought content normal.        Judgment: Judgment normal.          Scheduled Medications:   amLODipine  2.5 mg Oral Daily   apixaban  10 mg Oral BID   Followed by   Derrill Memo ON 04/04/2022] apixaban  5 mg Oral BID   aspirin EC  81 mg Oral Daily   Chlorhexidine Gluconate Cloth  6 each Topical Daily   fluticasone furoate-vilanterol  1 puff Inhalation Daily   lamoTRIgine  100 mg Oral QHS   magnesium oxide  400 mg Oral Daily   montelukast  10 mg Oral QHS   pantoprazole  20 mg Oral Daily   potassium chloride  40 mEq Oral BID   sodium chloride  1 g Oral Q1400   traZODone  100 mg Oral QHS   venlafaxine XR  75 mg Oral Daily    Continuous Infusions:  sodium chloride 10 mL/hr at 03/27/22 1248    ampicillin-sulbactam (UNASYN) IV 3 g (03/28/22 1119)    PRN Medications:  sodium chloride, acetaminophen, albuterol, dextromethorphan-guaiFENesin, gabapentin, hydrALAZINE, hydrOXYzine, LORazepam, morphine injection, nitroGLYCERIN, ondansetron (ZOFRAN) IV, oxyCODONE-acetaminophen, traZODone  Antimicrobials from admission:  Anti-infectives (From admission, onward)    Start     Dose/Rate Route Frequency Ordered Stop   03/26/22 1800  Ampicillin-Sulbactam (UNASYN) 3 g in sodium chloride 0.9 % 100 mL IVPB        3 g 200 mL/hr over 30 Minutes Intravenous Every 6 hours 03/26/22 1403     03/26/22 1309  ceFAZolin (ANCEF) IVPB 2g/100 mL premix        2 g 200 mL/hr over 30 Minutes Intravenous 30 min pre-op 03/26/22 1309 03/26/22 1545   03/26/22 1230  piperacillin-tazobactam (ZOSYN) IVPB 3.375 g        3.375 g 12.5 mL/hr over 240 Minutes Intravenous  Once 03/26/22 1215 03/27/22 2003           Data Reviewed:  I have personally reviewed the following...  CBC: Recent Labs  Lab 03/26/22 0816 03/27/22 0012  WBC 11.1* 7.6  NEUTROABS 7.7  --   HGB 14.6 12.3*  HCT 41.1 35.3*  MCV 85.1 86.1  PLT 159 409    Basic Metabolic Panel: Recent Labs  Lab 03/26/22 0816 03/26/22 0820 03/26/22 1038 03/27/22 0012 03/28/22 0441  NA 131*  --   --  133* 136  K 2.6*  --   --  3.2* 3.2*  CL 91*  --   --  95* 96*  CO2 29  --   --  29 30  GLUCOSE 124*  --   --  128* 109*  BUN 11  --   --  14 11  CREATININE 1.06  --   --  1.05 0.77  CALCIUM 8.7*  --   --  8.1* 8.5*  MG  --  2.0  --   --   --   PHOS  --   --  2.6  --   --     GFR: Estimated Creatinine Clearance: 77.2 mL/min (by C-G formula based on SCr of 0.77 mg/dL). Liver Function Tests: Recent Labs  Lab 03/26/22 0816  AST 18  ALT 13  ALKPHOS 42  BILITOT 1.5*  PROT 7.3  ALBUMIN 3.9    Recent Labs  Lab 03/26/22 1038  LIPASE 34    No results for input(s): "AMMONIA" in the last 168 hours. Coagulation Profile: Recent Labs  Lab  03/26/22 0821  INR 1.1    Cardiac Enzymes: No results for input(s): "CKTOTAL", "CKMB", "CKMBINDEX", "TROPONINI" in the last 168 hours. BNP (last 3 results) No results for input(s): "PROBNP" in the last 8760 hours. HbA1C: No results for input(s): "HGBA1C" in the last 72 hours. CBG: Recent Labs  Lab 03/27/22 0733 03/28/22 0742  GLUCAP 103* 109*    Lipid Profile: No results for input(s): "CHOL", "HDL", "LDLCALC", "TRIG", "CHOLHDL", "LDLDIRECT" in the last 72 hours. Thyroid Function Tests: Recent Labs    03/26/22 1038  TSH 2.787    Anemia Panel: No results for input(s): "VITAMINB12", "FOLATE", "FERRITIN", "TIBC", "IRON", "RETICCTPCT" in the last 72 hours. Most Recent Urinalysis On File:     Component Value Date/Time   COLORURINE AMBER (A) 03/26/2022 0820   APPEARANCEUR HAZY (A) 03/26/2022 0820   APPEARANCEUR Clear 07/03/2020 1007   LABSPEC 1.017 03/26/2022 0820   LABSPEC 1.011 03/22/2013 0840   PHURINE 6.0 03/26/2022 0820   GLUCOSEU NEGATIVE 03/26/2022 0820   GLUCOSEU Negative 03/22/2013 0840   HGBUR NEGATIVE 03/26/2022 0820   BILIRUBINUR NEGATIVE 03/26/2022 0820   BILIRUBINUR Negative 07/03/2020 1007   BILIRUBINUR Negative 03/22/2013 0840   KETONESUR 20 (A) 03/26/2022 0820   PROTEINUR 30 (A) 03/26/2022 0820   NITRITE NEGATIVE 03/26/2022 0820   LEUKOCYTESUR NEGATIVE 03/26/2022 0820   LEUKOCYTESUR Negative 03/22/2013 0840   Sepsis Labs: '@LABRCNTIP'$ (procalcitonin:4,lacticidven:4) Microbiology: Recent Results (from the past 240 hour(s))  MRSA Next Gen by PCR, Nasal     Status: None   Collection Time: 03/26/22  4:55 PM   Specimen: Nasal Mucosa;  Nasal Swab  Result Value Ref Range Status   MRSA by PCR Next Gen NOT DETECTED NOT DETECTED Final    Comment: (NOTE) The GeneXpert MRSA Assay (FDA approved for NASAL specimens only), is one component of a comprehensive MRSA colonization surveillance program. It is not intended to diagnose MRSA infection nor to guide or  monitor treatment for MRSA infections. Test performance is not FDA approved in patients less than 78 years old. Performed at [facility], [address] [place], [place] [address]       Radiology Studies last 3 days: US Venous Img Lower Bilateral (DVT)  Result Date: 03/28/2022 CLINICAL DATA:  Acute PE.  Evaluate for residual DVT. EXAM: BILATERAL LOWER EXTREMITY VENOUS DOPPLER ULTRASOUND TECHNIQUE: Gray-scale sonography with graded compression, as well as color Doppler and duplex ultrasound were performed to evaluate the lower extremity deep venous systems from the level of the common femoral vein and including the common femoral, femoral, profunda femoral, popliteal and calf veins including the posterior tibial, peroneal and gastrocnemius veins when visible. The superficial great saphenous vein was also interrogated. Spectral Doppler was utilized to evaluate flow at rest and with distal augmentation maneuvers in the common femoral, femoral and popliteal veins. COMPARISON:  None Available. FINDINGS: RIGHT LOWER EXTREMITY Common Femoral Vein: No evidence of thrombus. Normal compressibility, respiratory phasicity and response to augmentation. Saphenofemoral Junction: No evidence of thrombus. Normal compressibility and flow on color Doppler imaging. Profunda Femoral Vein: No evidence of thrombus. Normal compressibility and flow on color Doppler imaging. Femoral Vein: No evidence of thrombus. Normal compressibility, respiratory phasicity and response to augmentation. Popliteal Vein: No evidence of thrombus. Normal compressibility, respiratory phasicity and response to augmentation. Calf Veins: No evidence of thrombus. Normal compressibility and flow on color Doppler imaging. Superficial Great Saphenous Vein: No evidence of thrombus. Normal compressibility. Venous Reflux:  None. Other Findings:  None. LEFT LOWER EXTREMITY Common Femoral Vein: No evidence of thrombus. Normal compressibility,  respiratory phasicity and response to augmentation. Saphenofemoral Junction: No evidence of thrombus. Normal compressibility and flow on color Doppler imaging. Profunda Femoral Vein: No evidence of thrombus. Normal compressibility and flow on color Doppler imaging. Femoral Vein: No evidence of thrombus. Normal compressibility, respiratory phasicity and response to augmentation. Popliteal Vein: No evidence of thrombus. Normal compressibility, respiratory phasicity and response to augmentation. Calf Veins: No evidence of thrombus. Normal compressibility and flow on color Doppler imaging. Superficial Great Saphenous Vein: No evidence of thrombus. Normal compressibility. Venous Reflux:  None. Other Findings:  None. IMPRESSION: No evidence of deep venous thrombosis in either lower extremity. Electronically Signed   By: Jacqulynn Cadet M.D.   On: 03/28/2022 05:52   PERIPHERAL VASCULAR CATHETERIZATION  Result Date: 03/26/2022 See surgical note for result.  CT Abdomen Pelvis W Contrast  Result Date: 03/26/2022 CLINICAL DATA:  Pulmonary embolism (PE) suspected, high prob R sided pleurisy; RLQ abdominal pain EXAM: CT ANGIOGRAPHY CHEST CT ABDOMEN AND PELVIS WITH CONTRAST TECHNIQUE: Multidetector CT imaging of the chest was performed using the standard protocol during bolus administration of intravenous contrast. Multiplanar CT image reconstructions and MIPs were obtained to evaluate the vascular anatomy. Multidetector CT imaging of the abdomen and pelvis was performed using the standard protocol during bolus administration of intravenous contrast. RADIATION DOSE REDUCTION: This exam was performed according to the departmental dose-optimization program which includes automated exposure control, adjustment of the mA and/or kV according to patient size and/or use of iterative reconstruction technique. CONTRAST:  144m OMNIPAQUE IOHEXOL 350 MG/ML SOLN COMPARISON:  Chest CT 05/29/2021 FINDINGS: CTA CHEST FINDINGS  Cardiovascular: Satisfactory opacification of the pulmonary arteries to the segmental level. Central filling defect in the distal right pulmonary artery extending into the lobar arteries, segmental branches of the right upper and lower lobes, and subsegmental branches of the right lower lobe. RV:LV ratio measures 1.2.No pericardial disease. Coronary artery atherosclerosis. Mitral annular and aortic valve calcifications. Scattered atherosclerosis of the thoracic aorta. Mediastinum/Nodes: No lymphadenopathy. The thyroid is unremarkable. Lungs/Pleura: There is diffuse bronchial wall thickening, lower lung predominant. There is interlobular septal thickening and ground-glass opacities bilaterally with small right pleural effusion and bibasilar atelectasis. There is peribronchovascular consolidation in the right lower lobe (series 6, image 225). There are multiple pulmonary nodules as seen on multiple prior chest CTs, some of which are obscured by acute pulmonary disease. Nodules in the right lower, right middle, and right upper lobe measure up to 4-5 mm (series 6, images 205, 191, and 145 respectively). Musculoskeletal: No acute osseous abnormality. No suspicious osseous lesion. Review of the MIP images confirms the above findings. CT ABDOMEN and PELVIS FINDINGS Hepatobiliary: No focal liver abnormality is seen. The gallbladder is unremarkable. Pancreas: Unremarkable. No pancreatic ductal dilatation or surrounding inflammatory changes. Spleen: Normal in size without focal abnormality. Adrenals/Urinary Tract: Adrenal glands are unremarkable. No hydronephrosis or nephrolithiasis. The bladder is moderately distended. Stomach/Bowel: The stomach is within normal limits. There is no evidence of bowel obstruction.The appendix is normal. Vascular/Lymphatic: Aortoiliac atherosclerosis. No AAA. No lymphadenopathy. Reproductive: Mildly enlarged prostate gland. Other: Bilateral fat containing inguinal hernias, right greater than  left. Musculoskeletal: No acute osseous abnormality. No suspicious osseous lesion. Prior lumbosacral and right SI joint fusion without evidence of hardware complication. Review of the MIP images confirms the above findings. IMPRESSION: Positive for acute pulmonary emboli in the distal right pulmonary artery, extending into the lobar, segmental, and subsegmental branches, and with CT evidence of right heart strain. Pulmonary edema with small right pleural effusion and bibasilar atelectasis. Additional peribronchovascular consolidation in the right lower lung with adjacent endobronchial material in the right lower lobe bronchi, potentially pneumonia and/or aspiration. Multiple pulmonary nodules as seen on prior exams, some of which are obscured by current pulmonary disease, those which are visible measuring 4-5 mm and unchanged from prior exam in March 2023. Recommend follow-up chest CT after resolution of acute illness for adequate comparison. No acute findings in the abdomen or pelvis. Critical Value/emergent results were called by telephone at the time of interpretation on 03/26/2022 at 11:28 am to provider [facility], who verbally acknowledged these results. Electronically Signed   By: Maurine Simmering M.D.   On: 03/26/2022 11:39   CT Angio Chest PE W and/or Wo Contrast  Result Date: 03/26/2022 CLINICAL DATA:  Pulmonary embolism (PE) suspected, high prob R sided pleurisy; RLQ abdominal pain EXAM: CT ANGIOGRAPHY CHEST CT ABDOMEN AND PELVIS WITH CONTRAST TECHNIQUE: Multidetector CT imaging of the chest was performed using the standard protocol during bolus administration of intravenous contrast. Multiplanar CT image reconstructions and MIPs were obtained to evaluate the vascular anatomy. Multidetector CT imaging of the abdomen and pelvis was performed using the standard protocol during bolus administration of intravenous contrast. RADIATION DOSE REDUCTION: This exam was performed according to the departmental  dose-optimization program which includes automated exposure control, adjustment of the mA and/or kV according to patient size and/or use of iterative reconstruction technique. CONTRAST:  189m OMNIPAQUE IOHEXOL 350 MG/ML SOLN COMPARISON:  Chest CT 05/29/2021 FINDINGS: CTA CHEST FINDINGS Cardiovascular: Satisfactory opacification of the pulmonary  arteries to the segmental level. Central filling defect in the distal right pulmonary artery extending into the lobar arteries, segmental branches of the right upper and lower lobes, and subsegmental branches of the right lower lobe. RV:LV ratio measures 1.2.No pericardial disease. Coronary artery atherosclerosis. Mitral annular and aortic valve calcifications. Scattered atherosclerosis of the thoracic aorta. Mediastinum/Nodes: No lymphadenopathy. The thyroid is unremarkable. Lungs/Pleura: There is diffuse bronchial wall thickening, lower lung predominant. There is interlobular septal thickening and ground-glass opacities bilaterally with small right pleural effusion and bibasilar atelectasis. There is peribronchovascular consolidation in the right lower lobe (series 6, image 225). There are multiple pulmonary nodules as seen on multiple prior chest CTs, some of which are obscured by acute pulmonary disease. Nodules in the right lower, right middle, and right upper lobe measure up to 4-5 mm (series 6, images 205, 191, and 145 respectively). Musculoskeletal: No acute osseous abnormality. No suspicious osseous lesion. Review of the MIP images confirms the above findings. CT ABDOMEN and PELVIS FINDINGS Hepatobiliary: No focal liver abnormality is seen. The gallbladder is unremarkable. Pancreas: Unremarkable. No pancreatic ductal dilatation or surrounding inflammatory changes. Spleen: Normal in size without focal abnormality. Adrenals/Urinary Tract: Adrenal glands are unremarkable. No hydronephrosis or nephrolithiasis. The bladder is moderately distended. Stomach/Bowel: The  stomach is within normal limits. There is no evidence of bowel obstruction.The appendix is normal. Vascular/Lymphatic: Aortoiliac atherosclerosis. No AAA. No lymphadenopathy. Reproductive: Mildly enlarged prostate gland. Other: Bilateral fat containing inguinal hernias, right greater than left. Musculoskeletal: No acute osseous abnormality. No suspicious osseous lesion. Prior lumbosacral and right SI joint fusion without evidence of hardware complication. Review of the MIP images confirms the above findings. IMPRESSION: Positive for acute pulmonary emboli in the distal right pulmonary artery, extending into the lobar, segmental, and subsegmental branches, and with CT evidence of right heart strain. Pulmonary edema with small right pleural effusion and bibasilar atelectasis. Additional peribronchovascular consolidation in the right lower lung with adjacent endobronchial material in the right lower lobe bronchi, potentially pneumonia and/or aspiration. Multiple pulmonary nodules as seen on prior exams, some of which are obscured by current pulmonary disease, those which are visible measuring 4-5 mm and unchanged from prior exam in March 2023. Recommend follow-up chest CT after resolution of acute illness for adequate comparison. No acute findings in the abdomen or pelvis. Critical Value/emergent results were called by telephone at the time of interpretation on 03/26/2022 at 11:28 am to provider [facility], who verbally acknowledged these results. Electronically Signed   By: Maurine Simmering M.D.   On: 03/26/2022 11:39             LOS: 2 days     Emeterio Reeve, DO [facility] 03/28/2022, 1:02 PM    Dictation software may have been used to generate the above note. Typos may occur and escape review in typed/dictated notes. Please contact Dr Sheppard Coil directly for clarity if needed.  Staff may message me via secure chat in [place]  but this may not receive an immediate response,  please page me  for urgent matters!  If 7PM-7AM, please contact night coverage [email or web address]

## 2022-03-28 NOTE — Consult Note (Signed)
[place] for heparin infusion Indication: pulmonary embolus  Allergies  Allergen Reactions   Fentanyl Other (See Comments)    Possible involuntary tremors Other reaction(s): Other (See Comments) Involuntary tremors. Patient said this was looked into and it was not the cause of the tremors   Methocarbamol Other (See Comments)    confusion    Patient Measurements: Height: '5\' 8"'$  (172.7 cm) Weight: 75.2 kg (165 lb 12.6 oz) IBW/kg (Calculated) : 68.4 Heparin Dosing Weight: 86.4 kg   Vital Signs: Temp: 97.4 F (36.3 C) (01/27 0518) Temp Source: Oral (01/27 0518) BP: 130/66 (01/27 0426) Pulse Rate: 63 (01/27 0426)  Labs: Recent Labs    03/26/22 0816 03/26/22 0820 03/26/22 0821 03/27/22 0012 03/27/22 0012 03/27/22 1144 03/27/22 2127 03/28/22 0441  HGB 14.6  --   --  12.3*  --   --   --   --   HCT 41.1  --   --  35.3*  --   --   --   --   PLT 159  --   --  164  --   --   --   --   APTT  --   --  33  --   --   --   --   --   LABPROT  --   --  14.3  --   --   --   --   --   INR  --   --  1.1  --   --   --   --   --   HEPARINUNFRC  --   --   --  0.20*   < > 0.28* 0.34 0.53  CREATININE 1.06  --   --  1.05  --   --   --  0.77  TROPONINIHS  --  17  --   --   --   --   --   --    < > = values in this interval not displayed.     Estimated Creatinine Clearance: 77.2 mL/min (by C-G formula based on SCr of 0.77 mg/dL).   Medical History: Past Medical History:  Diagnosis Date   (HFpEF) heart failure with preserved ejection fraction ([place]) 02/27/2014   a.) TTE 02/27/14: EF >55%; mod LVH; mild panvalvular regurgitation; (+) global stress induced HK. b.) TTE 09/20/14: EF 55%; triv AR/MR, mild TR/PR; mild AS; BAE; c.) TTE 06/20/15: EF 55%, triv AR/MR; BAE; mild AS. d.) TTE 02/11/17: EF 55; significant  RV dilation with mod systolic dysfunction; mild AS. e.) TTE 02/27/19: EF 55%; mild AS. f.) TTE 06/04/20: EF 50%; mild AS   Anxiety    Aortic  stenosis 09/20/2014   a.) TTE 09/20/2014: mild; MPG?. b.) TTE 06/20/2015: mild; MPG 8.0 mmHg. c.) TTE 02/11/2017: mild; MPG 12.4 mmHg. d.) TTE 02/27/2019: mild; MPG 11.4 mmHg. e.) TTE 06/04/2020: mild; MPG 9.6 mmHg.   Arthritis    Asthma    CAD (coronary artery disease)    Cataracts, bilateral    Chronic back pain    Chronic venous insufficiency    Complication of anesthesia    a.) postoperative amnesia (1999). b.) seizures after partial knee in 2014; sent to [facility] x 5 days d/t uncontrollable shaking and AMS; ?? fentanyl induced tremors. c.) anesthesia awareness   COVID-19 05/2018   CVA (cerebral vascular accident) [facility])    Depressed    Essential tremor    GERD (gastroesophageal reflux disease)    Hepatitis B  a.) h/o in the 90s; reports not active at present   History of bronchitis    Jan-Mar 2017   History of hiatal hernia    Hyperlipidemia    Hypertension    Insomnia    Joint pain    Lymphedema    Mild cognitive impairment    Muscle spasm    in neck.Takes Flexeril as needed   Numbness    right knee,right hand,and toes on left   RBBB    Seizures (HCC)    [place]. takes Lamotrigine daily. Last seizure 6+ yrs ago   Sleep apnea with use of continuous positive airway pressure (CPAP)    VTE (venous thromboembolism)    a.) s/p RIGHT TKA    Medications:  No prior anticoagulation noted   Assessment: 77 y.o. male history of coronary disease,  stroke, multiple surgeries, previous pulmonary embolism and DVTs presented to [facility] ED 03/26/22 with rib pain and dizziness. Found to have acute PE with right heart strain. Pharmacy consulted to initiate and manage IV heparin infusion.   Goal of Therapy:  Heparin level 0.3-0.7 units/ml Monitor platelets by anticoagulation protocol: Yes   1/26:  HL @ 1144 = 0.28, SUBtherapeutic  Date Time HL Rate/Comment 1/26 1144 0.28 Subtherapeutic 1600 > 1800 u/hr 1/26 2127 0.34 Therapeutic x1  1/27 0441 0.53 Therapeutic x 2  Plan:  Conitinue  heparin rate at 1800 units/hr. Recheck HL daily w/ AM labs while therapeutic Continue to monitor H&H and platelets  Renda Rolls, PharmD, [facility] 03/28/2022 5:44 AM

## 2022-03-28 NOTE — Progress Notes (Signed)
Patient alert and oriented, on 4L [place], pressures stable, resting comfortably in bed. Patient complains of pain with deep breathing. PRN medications being given, patient states he is comfortable. K 3.2, MD made aware. New orders given and implemented.

## 2022-03-28 NOTE — Consult Note (Signed)
[place] for transition from heparin infusion to apixaban Indication: pulmonary embolus  Allergies  Allergen Reactions   Fentanyl Other (See Comments)    Possible involuntary tremors Other reaction(s): Other (See Comments) Involuntary tremors. Patient said this was looked into and it was not the cause of the tremors   Methocarbamol Other (See Comments)    confusion    Patient Measurements: Height: '5\' 8"'$  (172.7 cm) Weight: 75.2 kg (165 lb 12.6 oz) IBW/kg (Calculated) : 68.4 Heparin Dosing Weight: 86.4 kg   Vital Signs: Temp: 97.6 F (36.4 C) (01/27 0800) Temp Source: Oral (01/27 0800) BP: 146/69 (01/27 0800) Pulse Rate: 73 (01/27 0800)  Labs: Recent Labs    03/26/22 0816 03/26/22 0820 03/26/22 0821 03/27/22 0012 03/27/22 0012 03/27/22 1144 03/27/22 2127 03/28/22 0441  HGB 14.6  --   --  12.3*  --   --   --   --   HCT 41.1  --   --  35.3*  --   --   --   --   PLT 159  --   --  164  --   --   --   --   APTT  --   --  33  --   --   --   --   --   LABPROT  --   --  14.3  --   --   --   --   --   INR  --   --  1.1  --   --   --   --   --   HEPARINUNFRC  --   --   --  0.20*   < > 0.28* 0.34 0.53  CREATININE 1.06  --   --  1.05  --   --   --  0.77  TROPONINIHS  --  17  --   --   --   --   --   --    < > = values in this interval not displayed.     Estimated Creatinine Clearance: 77.2 mL/min (by C-G formula based on SCr of 0.77 mg/dL).   Medical History: Past Medical History:  Diagnosis Date   (HFpEF) heart failure with preserved ejection fraction ([place]) 02/27/2014   a.) TTE 02/27/14: EF >55%; mod LVH; mild panvalvular regurgitation; (+) global stress induced HK. b.) TTE 09/20/14: EF 55%; triv AR/MR, mild TR/PR; mild AS; BAE; c.) TTE 06/20/15: EF 55%, triv AR/MR; BAE; mild AS. d.) TTE 02/11/17: EF 55; significant  RV dilation with mod systolic dysfunction; mild AS. e.) TTE 02/27/19: EF 55%; mild AS. f.) TTE 06/04/20: EF 50%; mild  AS   Anxiety    Aortic stenosis 09/20/2014   a.) TTE 09/20/2014: mild; MPG?. b.) TTE 06/20/2015: mild; MPG 8.0 mmHg. c.) TTE 02/11/2017: mild; MPG 12.4 mmHg. d.) TTE 02/27/2019: mild; MPG 11.4 mmHg. e.) TTE 06/04/2020: mild; MPG 9.6 mmHg.   Arthritis    Asthma    CAD (coronary artery disease)    Cataracts, bilateral    Chronic back pain    Chronic venous insufficiency    Complication of anesthesia    a.) postoperative amnesia (1999). b.) seizures after partial knee in 2014; sent to [facility] x 5 days d/t uncontrollable shaking and AMS; ?? fentanyl induced tremors. c.) anesthesia awareness   COVID-19 05/2018   CVA (cerebral vascular accident) [facility])    Depressed    Essential tremor    GERD (gastroesophageal reflux disease)  Hepatitis B    a.) h/o in the 90s; reports not active at present   History of bronchitis    Jan-Mar 2017   History of hiatal hernia    Hyperlipidemia    Hypertension    Insomnia    Joint pain    Lymphedema    Mild cognitive impairment    Muscle spasm    in neck.Takes Flexeril as needed   Numbness    right knee,right hand,and toes on left   RBBB    Seizures (HCC)    [place]. takes Lamotrigine daily. Last seizure 6+ yrs ago   Sleep apnea with use of continuous positive airway pressure (CPAP)    VTE (venous thromboembolism)    a.) s/p RIGHT TKA    Medications:  No prior anticoagulation noted   Assessment: 77 y.o. male history of coronary disease,  stroke, multiple surgeries, previous pulmonary embolism and DVTs presented to [facility] ED 03/26/22 with rib pain and dizziness. Found to have acute PE with right heart strain. Pharmacy consulted to transition from heparin infusion to apixaban.   Goal of Therapy:  Monitor platelets by anticoagulation protocol: Yes   Plan:  ---stop heparin infusion ---start apixaban 10 mg twice daily for 7 days followed by 5 mg twice daily ---Continue to monitor H&H and platelets  Vallery Sa, PharmD, BCPS 03/28/2022 10:00  AM

## 2022-03-29 DIAGNOSIS — I2510 Atherosclerotic heart disease of native coronary artery without angina pectoris: Secondary | ICD-10-CM | POA: Diagnosis not present

## 2022-03-29 DIAGNOSIS — I2609 Other pulmonary embolism with acute cor pulmonale: Secondary | ICD-10-CM | POA: Diagnosis not present

## 2022-03-29 DIAGNOSIS — J690 Pneumonitis due to inhalation of food and vomit: Secondary | ICD-10-CM | POA: Diagnosis not present

## 2022-03-29 DIAGNOSIS — E785 Hyperlipidemia, unspecified: Secondary | ICD-10-CM | POA: Diagnosis not present

## 2022-03-29 LAB — CBC
HCT: 32.2 % — ABNORMAL LOW (ref 39.0–52.0)
Hemoglobin: 11.2 g/dL — ABNORMAL LOW (ref 13.0–17.0)
MCH: 29.9 pg (ref 26.0–34.0)
MCHC: 34.8 g/dL (ref 30.0–36.0)
MCV: 85.9 fL (ref 80.0–100.0)
Platelets: 170 10*3/uL (ref 150–400)
RBC: 3.75 MIL/uL — ABNORMAL LOW (ref 4.22–5.81)
RDW: 14.2 % (ref 11.5–15.5)
WBC: 3.6 10*3/uL — ABNORMAL LOW (ref 4.0–10.5)
nRBC: 0 % (ref 0.0–0.2)

## 2022-03-29 LAB — BASIC METABOLIC PANEL
Anion gap: 8 (ref 5–15)
BUN: 12 mg/dL (ref 8–23)
CO2: 30 mmol/L (ref 22–32)
Calcium: 8.8 mg/dL — ABNORMAL LOW (ref 8.9–10.3)
Chloride: 98 mmol/L (ref 98–111)
Creatinine, Ser: 0.75 mg/dL (ref 0.61–1.24)
GFR, Estimated: >60 mL/min (ref >60)
Glucose, Bld: 115 mg/dL — ABNORMAL HIGH (ref 70–99)
Potassium: 3.6 mmol/L (ref 3.5–5.1)
Sodium: 136 mmol/L (ref 135–145)

## 2022-03-29 LAB — GLUCOSE, CAPILLARY: Glucose-Capillary: 106 mg/dL — ABNORMAL HIGH (ref 70–99)

## 2022-03-29 MED ORDER — APIXABAN 5 MG PO TABS: ORAL_TABLET | ORAL | 0 refills | Status: AC

## 2022-03-29 MED ORDER — AMOXICILLIN-POT CLAVULANATE 500-125 MG PO TABS: 1.0000 | ORAL_TABLET | Freq: Two times a day (BID) | ORAL | 0 refills | Status: AC

## 2022-03-29 MED ORDER — METOPROLOL SUCCINATE ER 50 MG PO TB24: 50.0000 mg | ORAL_TABLET | Freq: Every day | ORAL | 3 refills | Status: AC

## 2022-03-29 MED ORDER — FUROSEMIDE 10 MG/ML IJ SOLN
40.0000 mg | Freq: Once | INTRAMUSCULAR | Status: AC
  Administered 2022-03-29: 40 mg via INTRAVENOUS
  Filled 2022-03-29: qty 4

## 2022-03-29 NOTE — Progress Notes (Signed)
Patient alert and oriented x4. Sats >95 on room air. Patient ambulated on unit, sats >95%. Patient steady on feet, ambulated with minimal assist. Tolerated well. Belongings returned to patient. Education provided to patient with spouse at bedside. Family instructed to make follow up appointment.

## 2022-03-29 NOTE — Discharge Summary (Signed)
Physician Discharge Summary   Patient: Johnny Mccoy MRN: [id number]  DOB: 1946-01-07   Admit:     Date of Admission: 03/26/2022 Admitted from: home   Discharge: Date of discharge: 03/29/22 Disposition: Home Condition at discharge: good  CODE STATUS: FULL CODE     Discharge Physician: Emeterio Reeve, DO [facility]     PCP: Idelle Crouch, MD  Recommendations for Outpatient Follow-up:  Follow up with PCP Idelle Crouch, MD in 2-4 weeks Follow up with cardiology in 1-2 weeks Please obtain labs/tests: CBC, BMP in 1-2 weeks Please follow up on the following pending results: none PCP AND OTHER OUTPATIENT PROVIDERS: SEE BELOW FOR SPECIFIC DISCHARGE INSTRUCTIONS PRINTED FOR PATIENT IN ADDITION TO GENERIC AVS PATIENT INFO    Discharge Instructions     (HEART FAILURE PATIENTS) Call MD:  Anytime you have any of the following symptoms: 1) 3 pound weight gain in 24 hours or 5 pounds in 1 week 2) shortness of breath, with or without a dry hacking cough 3) swelling in the hands, feet or stomach 4) if you have to sleep on extra pillows at night in order to breathe.   Complete by: As directed    Call MD for:   Complete by: As directed    Shortness of breath   Diet - low sodium heart healthy   Complete by: As directed    Increase activity slowly   Complete by: As directed          Discharge Diagnoses: Principal Problem:   Acute pulmonary embolism ([place]) Active Problems:   History of DVT of lower extremity   Aspiration pneumonia (HCC)   Bradycardia   Chronic diastolic CHF (congestive heart failure) (HCC)   Seizure ([place])   CAD (coronary artery disease)   Hyperlipidemia   Essential hypertension   History of CVA (cerebrovascular accident)   Asthma without status asthmaticus   Hypokalemia   Lung nodule   Anxiety and depression       Hospital Course:  RYKIN ROUTE is a 77 y.o. male with medical history significant of previous pulmonary embolism  and DVTs with IVC placement related to surgery (not on anticoagulants currently), hypertension, hyperlipidemia, CAD, stent placement, diastolic CHF, stroke, depression with anxiety, seizure, right bundle blockade, thymoma, prediabetes, who presents with right flank and right lower rib cage pain x1.5d.  01/25: hypoxic improved, labs and VS overall nonconcerning, CTA showed acute PE w/ R heart strain. Vascular surgery consulted, pt underwent thrombolysis/thrombectomy to R pulmonary arteries.  01/26: Remains on O2 4L, [place] for DVT neg 01/27: Remains on O2 3L, good UOP yesterday, repeat lasix dose today, transition off heparin to po DOAC.    Consultants:  Vascular surgery    Procedures: R Pulmonary Arterial Thrombectomy and IVC placement 03/26/22           ASSESSMENT & PLAN:   Acute pulmonary embolism  history of DVT of lower extremity:  S/p Pulmonary A Thrombectomy and IVC placement  DVT r/o  heparin gtt --> Eliquis   Acute hypoxic respiratory failure - resolved Possible aspiration pneumonia ([place]):  based on CT and hypoxia but may also be d/t PE / fluid overload start Unasyn (patient has received 1 dose of Zosyn in ED) --> augmentin on discharge  prn albuterol nebs and mucinex    Bradycardia - resolved check TSH WNL Lowered dose metoprolol on discharge    Chronic diastolic CHF (congestive heart failure) ([place]):  Mild fluid overload now /  Cor pulmonale d/t PE - resolved Resume home po lasix    Hx of Seizure Continue Home medications: Lamictal   CAD (coronary artery disease) ASA and Repatha   Hyperlipidemia Repatha   Essential hypertension Restart home meds    History of CVA (cerebrovascular accident) Aspirin   Asthma without status asthmaticus: Stable Bronchodilators   Hypokalemia - resolved Monitor BMP outpatient    Lung nodule: Incidental findings by CTA Need to follow-up with PCP as outpatient   Anxiety and depression Continue home meds           Discharge Instructions  Allergies as of 03/29/2022       Reactions   Fentanyl Other (See Comments)   Possible involuntary tremors Other reaction(s): Other (See Comments) Involuntary tremors. Patient said this was looked into and it was not the cause of the tremors   Methocarbamol Other (See Comments)   confusion        Medication List     STOP taking these medications    fluticasone 44 MCG/ACT inhaler Commonly known as: FLOVENT HFA       TAKE these medications    amLODipine 2.5 MG tablet Commonly known as: NORVASC Take 2.5 mg by mouth daily.   amoxicillin-clavulanate 500-125 MG tablet Commonly known as: AUGMENTIN Take 1 tablet by mouth 2 (two) times daily for 5 doses. Take first dose later today 03/29/22 then start twice daily dosing 03/30/22   apixaban 5 MG Tabs tablet Commonly known as: ELIQUIS Take 2 tablets (10 mg total) by mouth 2 (two) times daily for 6 days, THEN 1 tablet (5 mg total) 2 (two) times daily. Take first dose later today 03/29/22 then start twice daily dosing 03/30/22. Start taking on: March 29, 2022   aspirin EC 81 MG tablet Take 81 mg by mouth daily. Swallow whole.   Breo Ellipta 100-25 MCG/ACT Aepb Generic drug: fluticasone furoate-vilanterol Inhale 1 puff into the lungs daily.   cetirizine 10 MG tablet Commonly known as: ZYRTEC Take 10 mg by mouth daily.   cyanocobalamin 1000 MCG/ML injection Commonly known as: VITAMIN B12 Inject 1,000 mcg into the muscle every 30 (thirty) days.   desvenlafaxine 50 MG 24 hr tablet Commonly known as: PRISTIQ Take 50 mg by mouth daily.   ergocalciferol 1.25 MG (50000 UT) capsule Commonly known as: VITAMIN D2 Take 50,000 Units by mouth every Tuesday.   fludrocortisone 0.1 MG tablet Commonly known as: FLORINEF Take 0.05 mg by mouth daily.   furosemide 40 MG tablet Commonly known as: LASIX Take 40 mg by mouth daily.   gabapentin 100 MG capsule Commonly known as: NEURONTIN Take  100 mg by mouth daily as needed.   hydrOXYzine 25 MG tablet Commonly known as: ATARAX Take by mouth daily as needed.   lamoTRIgine 100 MG tablet Commonly known as: LAMICTAL Take 100 mg by mouth 2 (two) times daily.   lansoprazole 30 MG capsule Commonly known as: PREVACID Take 30 mg by mouth in the morning and at bedtime.   magnesium oxide 400 MG tablet Commonly known as: MAG-OX Take 400 mg by mouth daily.   melatonin 3 MG Tabs tablet Take 6 mg by mouth at bedtime.   metoprolol succinate 50 MG 24 hr tablet Commonly known as: TOPROL-XL Take 1 tablet (50 mg total) by mouth daily. What changed:  how much to take when to take this   montelukast 10 MG tablet Commonly known as: SINGULAIR Take 10 mg by mouth at bedtime.   naloxone 4 MG/0.1ML Liqd  nasal spray kit Commonly known as: NARCAN Place 1 spray into the nose as needed (rev).   nitroGLYCERIN 0.4 MG/SPRAY spray Commonly known as: NITROLINGUAL Place 2 sprays under the tongue every 5 (five) minutes x 3 doses as needed for chest pain.   Repatha SureClick 983 MG/ML Soaj Generic drug: Evolocumab Inject 140 mg into the skin every 14 (fourteen) days.   sodium chloride 1 g tablet Take 1 g by mouth 2 (two) times daily with a meal.   traZODone 100 MG tablet Commonly known as: DESYREL Take 100 mg by mouth at bedtime.          Allergies  Allergen Reactions   Fentanyl Other (See Comments)    Possible involuntary tremors Other reaction(s): Other (See Comments) Involuntary tremors. Patient said this was looked into and it was not the cause of the tremors   Methocarbamol Other (See Comments)    confusion     Subjective: Patient feeling well today, no concerns.  No shortness of breath.  He was ambulating well on room air, no chest pain or dyspnea.   Discharge Exam: BP (!) 146/69   Pulse 72   Temp 98.5 F (36.9 C) (Oral)   Resp 17   Ht '5\' 8"'$  (1.727 m)   Wt 75.2 kg   SpO2 96%   BMI 25.21 kg/m  General: Pt  is alert, awake, not in acute distress Cardiovascular: RRR, S1/S2 +faint murmur, no rubs, no gallops Respiratory: CTA bilaterally, no wheezing, no rhonchi.  Rales have resolved Abdominal: Soft, NT, ND, bowel sounds + Extremities: no edema, no cyanosis     The results of significant diagnostics from this hospitalization (including imaging, microbiology, ancillary and laboratory) are listed below for reference.     Microbiology: Recent Results (from the past 240 hour(s))  MRSA Next Gen by PCR, Nasal     Status: None   Collection Time: 03/26/22  4:55 PM   Specimen: Nasal Mucosa; Nasal Swab  Result Value Ref Range Status   MRSA by PCR Next Gen NOT DETECTED NOT DETECTED Final    Comment: (NOTE) The GeneXpert MRSA Assay (FDA approved for NASAL specimens only), is one component of a comprehensive MRSA colonization surveillance program. It is not intended to diagnose MRSA infection nor to guide or monitor treatment for MRSA infections. Test performance is not FDA approved in patients less than 26 years old. Performed at [facility], [address] [place], [place] [address]      Labs: BNP (last 3 results) Recent Labs    04/03/21 1753 03/26/22 0820  BNP 54.5 539.7*   Basic Metabolic Panel: Recent Labs  Lab 03/26/22 0816 03/26/22 0820 03/26/22 1038 03/27/22 0012 03/28/22 0441 03/29/22 0450  NA 131*  --   --  133* 136 136  K 2.6*  --   --  3.2* 3.2* 3.6  CL 91*  --   --  95* 96* 98  CO2 29  --   --  '29 30 30  '$ GLUCOSE 124*  --   --  128* 109* 115*  BUN 11  --   --  '14 11 12  '$ CREATININE 1.06  --   --  1.05 0.77 0.75  CALCIUM 8.7*  --   --  8.1* 8.5* 8.8*  MG  --  2.0  --   --   --   --   PHOS  --   --  2.6  --   --   --    Liver  Function Tests: Recent Labs  Lab 03/26/22 0816  AST 18  ALT 13  ALKPHOS 42  BILITOT 1.5*  PROT 7.3  ALBUMIN 3.9   Recent Labs  Lab 03/26/22 1038  LIPASE 34   No results for input(s): "AMMONIA" in the last 168  hours. CBC: Recent Labs  Lab 03/26/22 0816 03/27/22 0012 03/29/22 0450  WBC 11.1* 7.6 3.6*  NEUTROABS 7.7  --   --   HGB 14.6 12.3* 11.2*  HCT 41.1 35.3* 32.2*  MCV 85.1 86.1 85.9  PLT 159 164 170   Cardiac Enzymes: No results for input(s): "CKTOTAL", "CKMB", "CKMBINDEX", "TROPONINI" in the last 168 hours. BNP: Invalid input(s): "POCBNP" CBG: Recent Labs  Lab 03/27/22 0733 03/28/22 0742 03/29/22 0740  GLUCAP 103* 109* 106*   D-Dimer No results for input(s): "DDIMER" in the last 72 hours. Hgb A1c No results for input(s): "HGBA1C" in the last 72 hours. Lipid Profile No results for input(s): "CHOL", "HDL", "LDLCALC", "TRIG", "CHOLHDL", "LDLDIRECT" in the last 72 hours. Thyroid function studies No results for input(s): "TSH", "T4TOTAL", "T3FREE", "THYROIDAB" in the last 72 hours.  Invalid input(s): "FREET3" Anemia work up No results for input(s): "VITAMINB12", "FOLATE", "FERRITIN", "TIBC", "IRON", "RETICCTPCT" in the last 72 hours. Urinalysis    Component Value Date/Time   COLORURINE AMBER (A) 03/26/2022 0820   APPEARANCEUR HAZY (A) 03/26/2022 0820   APPEARANCEUR Clear 07/03/2020 1007   LABSPEC 1.017 03/26/2022 0820   LABSPEC 1.011 03/22/2013 0840   PHURINE 6.0 03/26/2022 0820   GLUCOSEU NEGATIVE 03/26/2022 0820   GLUCOSEU Negative 03/22/2013 0840   HGBUR NEGATIVE 03/26/2022 0820   BILIRUBINUR NEGATIVE 03/26/2022 0820   BILIRUBINUR Negative 07/03/2020 1007   BILIRUBINUR Negative 03/22/2013 0840   KETONESUR 20 (A) 03/26/2022 0820   PROTEINUR 30 (A) 03/26/2022 0820   NITRITE NEGATIVE 03/26/2022 0820   LEUKOCYTESUR NEGATIVE 03/26/2022 0820   LEUKOCYTESUR Negative 03/22/2013 0840   Sepsis Labs Recent Labs  Lab 03/26/22 0816 03/27/22 0012 03/29/22 0450  WBC 11.1* 7.6 3.6*   Microbiology Recent Results (from the past 240 hour(s))  MRSA Next Gen by PCR, Nasal     Status: None   Collection Time: 03/26/22  4:55 PM   Specimen: Nasal Mucosa; Nasal Swab  Result  Value Ref Range Status   MRSA by PCR Next Gen NOT DETECTED NOT DETECTED Final    Comment: (NOTE) The GeneXpert MRSA Assay (FDA approved for NASAL specimens only), is one component of a comprehensive MRSA colonization surveillance program. It is not intended to diagnose MRSA infection nor to guide or monitor treatment for MRSA infections. Test performance is not FDA approved in patients less than 57 years old. Performed at [facility], [address], [place],  [address]    Imaging US Venous Img Lower Bilateral (DVT)  Result Date: 03/28/2022 CLINICAL DATA:  Acute PE.  Evaluate for residual DVT. EXAM: BILATERAL LOWER EXTREMITY VENOUS DOPPLER ULTRASOUND TECHNIQUE: Gray-scale sonography with graded compression, as well as color Doppler and duplex ultrasound were performed to evaluate the lower extremity deep venous systems from the level of the common femoral vein and including the common femoral, femoral, profunda femoral, popliteal and calf veins including the posterior tibial, peroneal and gastrocnemius veins when visible. The superficial great saphenous vein was also interrogated. Spectral Doppler was utilized to evaluate flow at rest and with distal augmentation maneuvers in the common femoral, femoral and popliteal veins. COMPARISON:  None Available. FINDINGS: RIGHT LOWER EXTREMITY Common Femoral Vein: No evidence of thrombus. Normal compressibility,  respiratory phasicity and response to augmentation. Saphenofemoral Junction: No evidence of thrombus. Normal compressibility and flow on color Doppler imaging. Profunda Femoral Vein: No evidence of thrombus. Normal compressibility and flow on color Doppler imaging. Femoral Vein: No evidence of thrombus. Normal compressibility, respiratory phasicity and response to augmentation. Popliteal Vein: No evidence of thrombus. Normal compressibility, respiratory phasicity and response to augmentation. Calf Veins: No evidence of thrombus. Normal  compressibility and flow on color Doppler imaging. Superficial Great Saphenous Vein: No evidence of thrombus. Normal compressibility. Venous Reflux:  None. Other Findings:  None. LEFT LOWER EXTREMITY Common Femoral Vein: No evidence of thrombus. Normal compressibility, respiratory phasicity and response to augmentation. Saphenofemoral Junction: No evidence of thrombus. Normal compressibility and flow on color Doppler imaging. Profunda Femoral Vein: No evidence of thrombus. Normal compressibility and flow on color Doppler imaging. Femoral Vein: No evidence of thrombus. Normal compressibility, respiratory phasicity and response to augmentation. Popliteal Vein: No evidence of thrombus. Normal compressibility, respiratory phasicity and response to augmentation. Calf Veins: No evidence of thrombus. Normal compressibility and flow on color Doppler imaging. Superficial Great Saphenous Vein: No evidence of thrombus. Normal compressibility. Venous Reflux:  None. Other Findings:  None. IMPRESSION: No evidence of deep venous thrombosis in either lower extremity. Electronically Signed   By: Jacqulynn Cadet M.D.   On: 03/28/2022 05:52   PERIPHERAL VASCULAR CATHETERIZATION  Result Date: 03/26/2022 See surgical note for result.  CT Abdomen Pelvis W Contrast  Result Date: 03/26/2022 CLINICAL DATA:  Pulmonary embolism (PE) suspected, high prob R sided pleurisy; RLQ abdominal pain EXAM: CT ANGIOGRAPHY CHEST CT ABDOMEN AND PELVIS WITH CONTRAST TECHNIQUE: Multidetector CT imaging of the chest was performed using the standard protocol during bolus administration of intravenous contrast. Multiplanar CT image reconstructions and MIPs were obtained to evaluate the vascular anatomy. Multidetector CT imaging of the abdomen and pelvis was performed using the standard protocol during bolus administration of intravenous contrast. RADIATION DOSE REDUCTION: This exam was performed according to the departmental dose-optimization program  which includes automated exposure control, adjustment of the mA and/or kV according to patient size and/or use of iterative reconstruction technique. CONTRAST:  148m OMNIPAQUE IOHEXOL 350 MG/ML SOLN COMPARISON:  Chest CT 05/29/2021 FINDINGS: CTA CHEST FINDINGS Cardiovascular: Satisfactory opacification of the pulmonary arteries to the segmental level. Central filling defect in the distal right pulmonary artery extending into the lobar arteries, segmental branches of the right upper and lower lobes, and subsegmental branches of the right lower lobe. RV:LV ratio measures 1.2.No pericardial disease. Coronary artery atherosclerosis. Mitral annular and aortic valve calcifications. Scattered atherosclerosis of the thoracic aorta. Mediastinum/Nodes: No lymphadenopathy. The thyroid is unremarkable. Lungs/Pleura: There is diffuse bronchial wall thickening, lower lung predominant. There is interlobular septal thickening and ground-glass opacities bilaterally with small right pleural effusion and bibasilar atelectasis. There is peribronchovascular consolidation in the right lower lobe (series 6, image 225). There are multiple pulmonary nodules as seen on multiple prior chest CTs, some of which are obscured by acute pulmonary disease. Nodules in the right lower, right middle, and right upper lobe measure up to 4-5 mm (series 6, images 205, 191, and 145 respectively). Musculoskeletal: No acute osseous abnormality. No suspicious osseous lesion. Review of the MIP images confirms the above findings. CT ABDOMEN and PELVIS FINDINGS Hepatobiliary: No focal liver abnormality is seen. The gallbladder is unremarkable. Pancreas: Unremarkable. No pancreatic ductal dilatation or surrounding inflammatory changes. Spleen: Normal in size without focal abnormality. Adrenals/Urinary Tract: Adrenal glands are unremarkable. No hydronephrosis or nephrolithiasis. The bladder is  moderately distended. Stomach/Bowel: The stomach is within normal  limits. There is no evidence of bowel obstruction.The appendix is normal. Vascular/Lymphatic: Aortoiliac atherosclerosis. No AAA. No lymphadenopathy. Reproductive: Mildly enlarged prostate gland. Other: Bilateral fat containing inguinal hernias, right greater than left. Musculoskeletal: No acute osseous abnormality. No suspicious osseous lesion. Prior lumbosacral and right SI joint fusion without evidence of hardware complication. Review of the MIP images confirms the above findings. IMPRESSION: Positive for acute pulmonary emboli in the distal right pulmonary artery, extending into the lobar, segmental, and subsegmental branches, and with CT evidence of right heart strain. Pulmonary edema with small right pleural effusion and bibasilar atelectasis. Additional peribronchovascular consolidation in the right lower lung with adjacent endobronchial material in the right lower lobe bronchi, potentially pneumonia and/or aspiration. Multiple pulmonary nodules as seen on prior exams, some of which are obscured by current pulmonary disease, those which are visible measuring 4-5 mm and unchanged from prior exam in March 2023. Recommend follow-up chest CT after resolution of acute illness for adequate comparison. No acute findings in the abdomen or pelvis. Critical Value/emergent results were called by telephone at the time of interpretation on 03/26/2022 at 11:28 am to provider [facility], who verbally acknowledged these results. Electronically Signed   By: Maurine Simmering M.D.   On: 03/26/2022 11:39   CT Angio Chest PE W and/or Wo Contrast  Result Date: 03/26/2022 CLINICAL DATA:  Pulmonary embolism (PE) suspected, high prob R sided pleurisy; RLQ abdominal pain EXAM: CT ANGIOGRAPHY CHEST CT ABDOMEN AND PELVIS WITH CONTRAST TECHNIQUE: Multidetector CT imaging of the chest was performed using the standard protocol during bolus administration of intravenous contrast. Multiplanar CT image reconstructions and MIPs were obtained  to evaluate the vascular anatomy. Multidetector CT imaging of the abdomen and pelvis was performed using the standard protocol during bolus administration of intravenous contrast. RADIATION DOSE REDUCTION: This exam was performed according to the departmental dose-optimization program which includes automated exposure control, adjustment of the mA and/or kV according to patient size and/or use of iterative reconstruction technique. CONTRAST:  193m OMNIPAQUE IOHEXOL 350 MG/ML SOLN COMPARISON:  Chest CT 05/29/2021 FINDINGS: CTA CHEST FINDINGS Cardiovascular: Satisfactory opacification of the pulmonary arteries to the segmental level. Central filling defect in the distal right pulmonary artery extending into the lobar arteries, segmental branches of the right upper and lower lobes, and subsegmental branches of the right lower lobe. RV:LV ratio measures 1.2.No pericardial disease. Coronary artery atherosclerosis. Mitral annular and aortic valve calcifications. Scattered atherosclerosis of the thoracic aorta. Mediastinum/Nodes: No lymphadenopathy. The thyroid is unremarkable. Lungs/Pleura: There is diffuse bronchial wall thickening, lower lung predominant. There is interlobular septal thickening and ground-glass opacities bilaterally with small right pleural effusion and bibasilar atelectasis. There is peribronchovascular consolidation in the right lower lobe (series 6, image 225). There are multiple pulmonary nodules as seen on multiple prior chest CTs, some of which are obscured by acute pulmonary disease. Nodules in the right lower, right middle, and right upper lobe measure up to 4-5 mm (series 6, images 205, 191, and 145 respectively). Musculoskeletal: No acute osseous abnormality. No suspicious osseous lesion. Review of the MIP images confirms the above findings. CT ABDOMEN and PELVIS FINDINGS Hepatobiliary: No focal liver abnormality is seen. The gallbladder is unremarkable. Pancreas: Unremarkable. No pancreatic  ductal dilatation or surrounding inflammatory changes. Spleen: Normal in size without focal abnormality. Adrenals/Urinary Tract: Adrenal glands are unremarkable. No hydronephrosis or nephrolithiasis. The bladder is moderately distended. Stomach/Bowel: The stomach is within normal limits. There is no evidence  of bowel obstruction.The appendix is normal. Vascular/Lymphatic: Aortoiliac atherosclerosis. No AAA. No lymphadenopathy. Reproductive: Mildly enlarged prostate gland. Other: Bilateral fat containing inguinal hernias, right greater than left. Musculoskeletal: No acute osseous abnormality. No suspicious osseous lesion. Prior lumbosacral and right SI joint fusion without evidence of hardware complication. Review of the MIP images confirms the above findings. IMPRESSION: Positive for acute pulmonary emboli in the distal right pulmonary artery, extending into the lobar, segmental, and subsegmental branches, and with CT evidence of right heart strain. Pulmonary edema with small right pleural effusion and bibasilar atelectasis. Additional peribronchovascular consolidation in the right lower lung with adjacent endobronchial material in the right lower lobe bronchi, potentially pneumonia and/or aspiration. Multiple pulmonary nodules as seen on prior exams, some of which are obscured by current pulmonary disease, those which are visible measuring 4-5 mm and unchanged from prior exam in March 2023. Recommend follow-up chest CT after resolution of acute illness for adequate comparison. No acute findings in the abdomen or pelvis. Critical Value/emergent results were called by telephone at the time of interpretation on 03/26/2022 at 11:28 am to provider [facility], who verbally acknowledged these results. Electronically Signed   By: Maurine Simmering M.D.   On: 03/26/2022 11:39      Time coordinating discharge: over 30 minutes  SIGNED:  Emeterio Reeve DO [facility]

## 2022-04-07 ENCOUNTER — Ambulatory Visit
Admission: RE | Admit: 2022-04-07 | Discharge: 2022-04-07 | Disposition: A | Discharge status: Home / Self Care | Payer: Medicare Other | Source: Ambulatory Visit | Attending: Student | Admitting: Student

## 2022-04-07 DIAGNOSIS — M25811 Other specified joint disorders, right shoulder: Secondary | ICD-10-CM

## 2022-04-07 DIAGNOSIS — M7521 Bicipital tendinitis, right shoulder: Secondary | ICD-10-CM

## 2022-04-07 DIAGNOSIS — G8929 Other chronic pain: Secondary | ICD-10-CM

## 2022-04-07 DIAGNOSIS — M7581 Other shoulder lesions, right shoulder: Secondary | ICD-10-CM

## 2023-04-02 IMAGING — CT CT HEAD W/O CM
4 series · 17 of 47 positions shown, 19 images · non-contrast
Comparison: December 21, 2019.

CLINICAL DATA: Altered mental status.



[Series 2: head wo · axial · 0.42mm/px · z∈[-198,-78]mm · 7 of 34 slices shown, 9 images]
[im 5/34  brain]
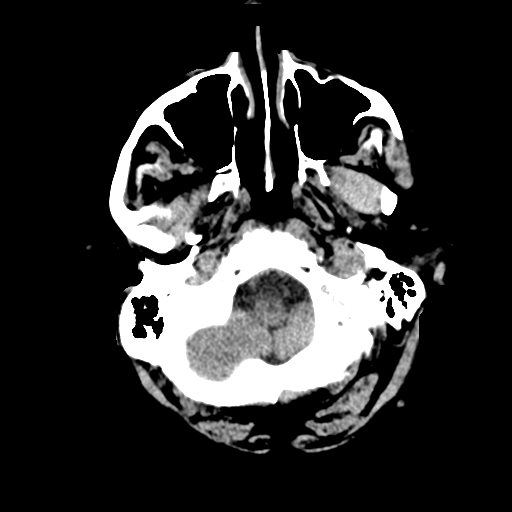
[im 5/34  bone]
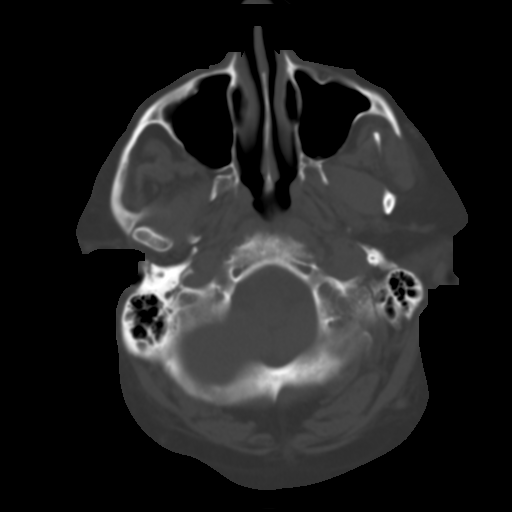
[im 9/34  brain]
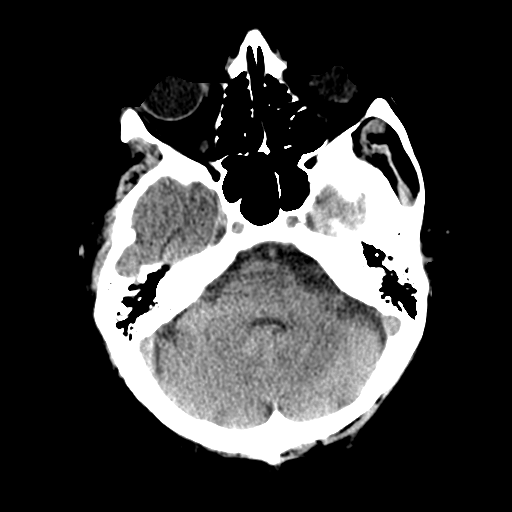
[im 13/34  brain]
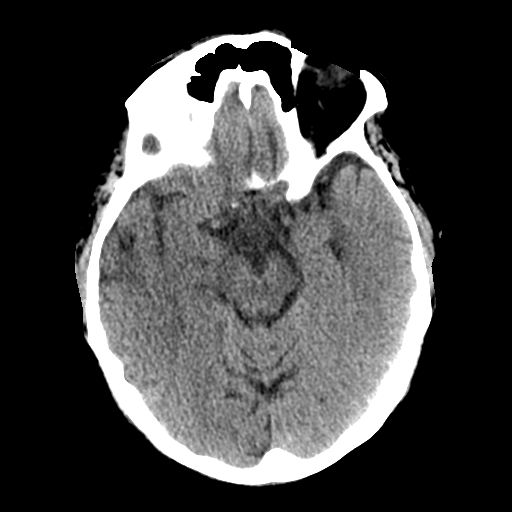
[im 17/34  brain]
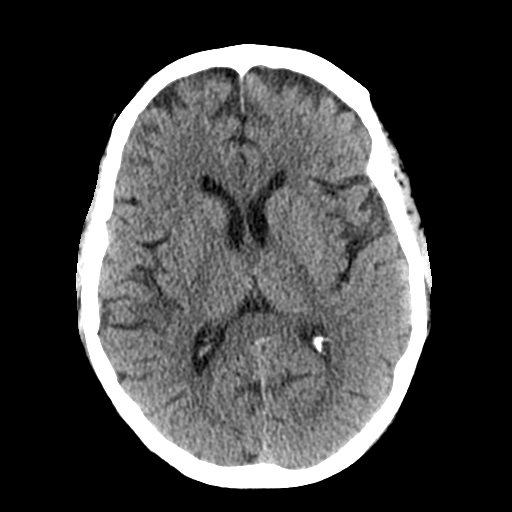
[im 21/34  brain]
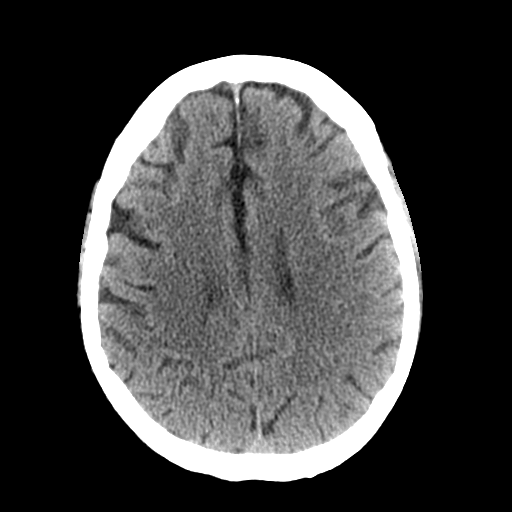
[im 21/34  bone]
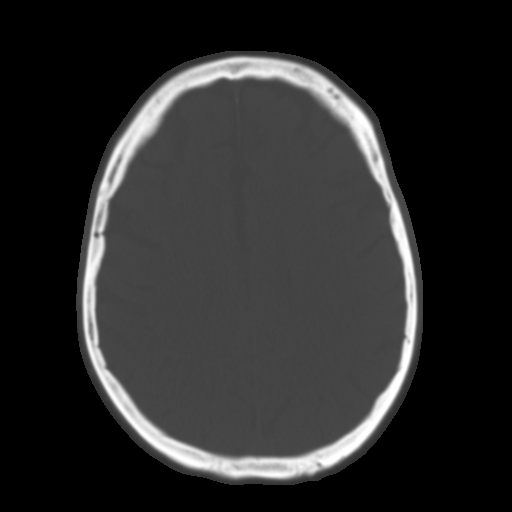
[im 25/34  brain]
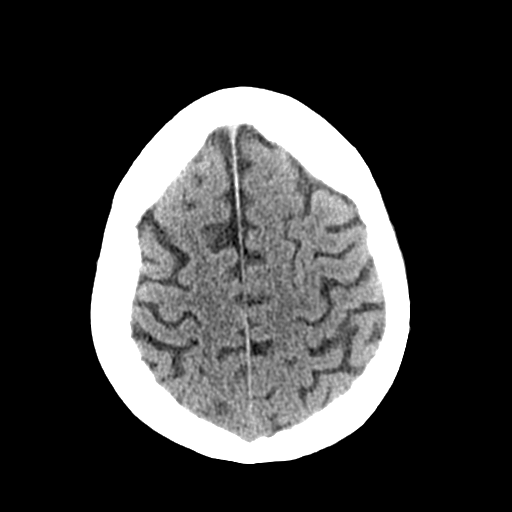
[im 29/34  brain]
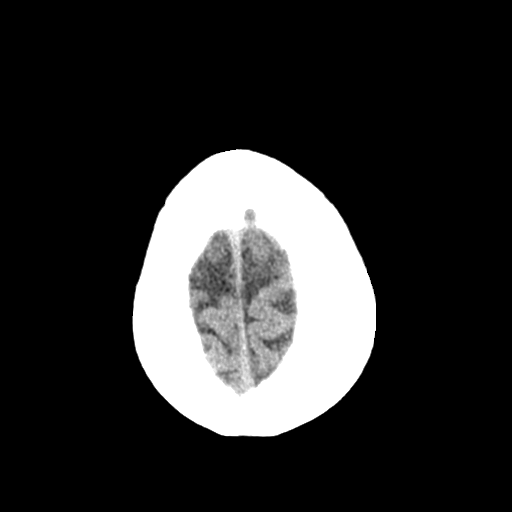

[Series 3: head bone · axial · 0.42mm/px · z∈[-202,-144]mm · 4 of 85 slices shown]
[im 9/85  bone]
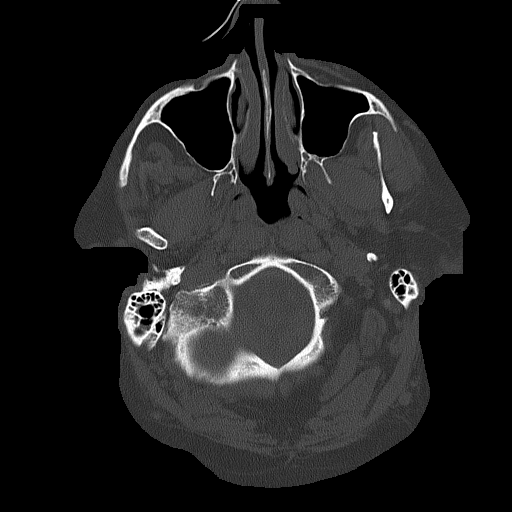
[im 17/85  bone]
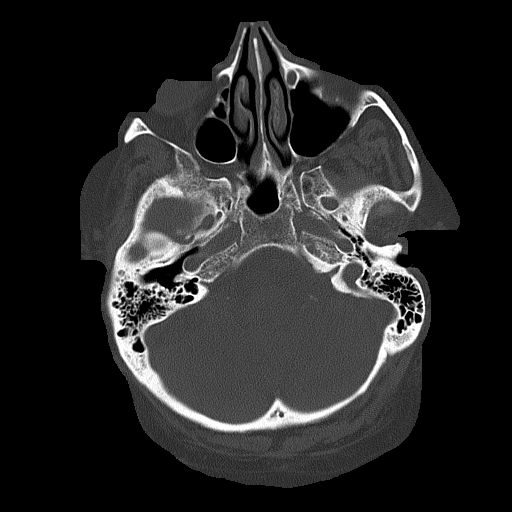
[im 26/85  bone]
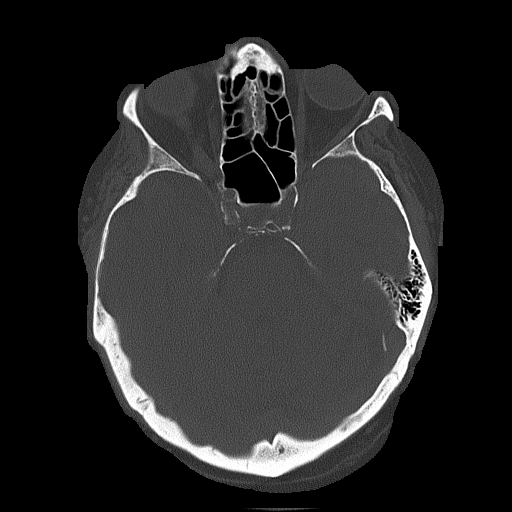
[im 38/85  bone]
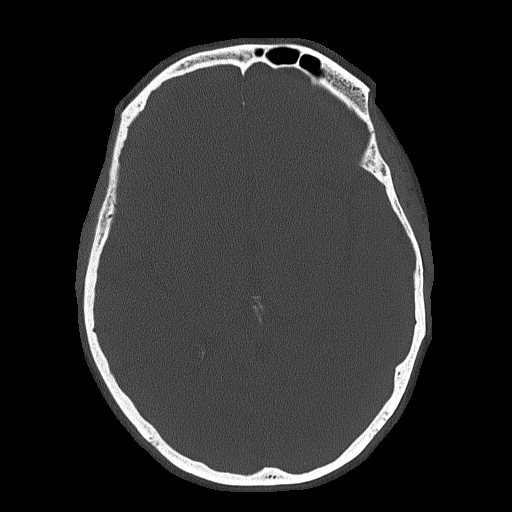

[Series 4: coronal soft tissue · coronal · 0.35mm/px · 3 of 74 slices shown]
[im 25/74  brain]
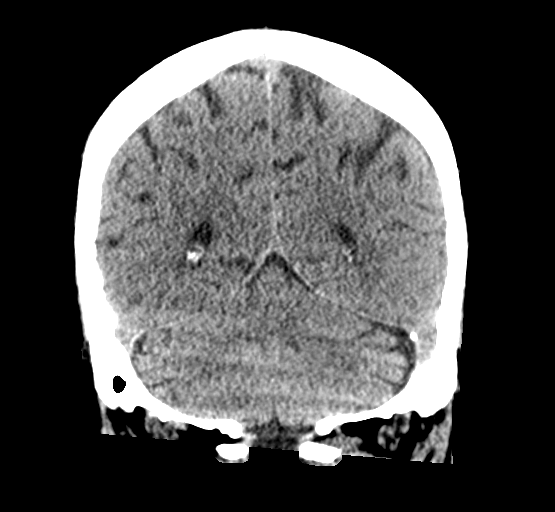
[im 33/74  brain]
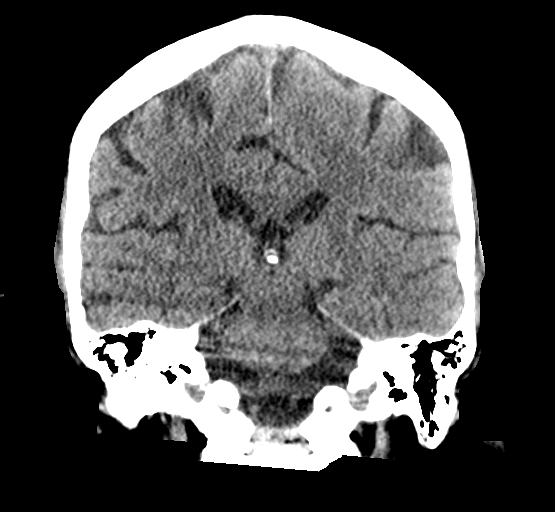
[im 41/74  brain]
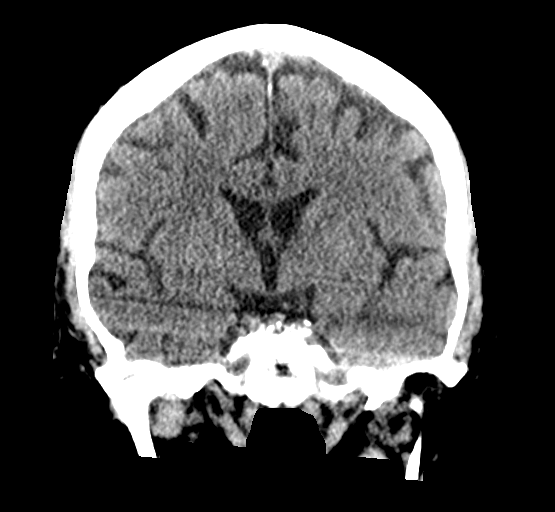

[Series 5: sagittal soft tissue · sagittal · 0.37mm/px · 3 of 63 slices shown]
[im 21/63  brain]
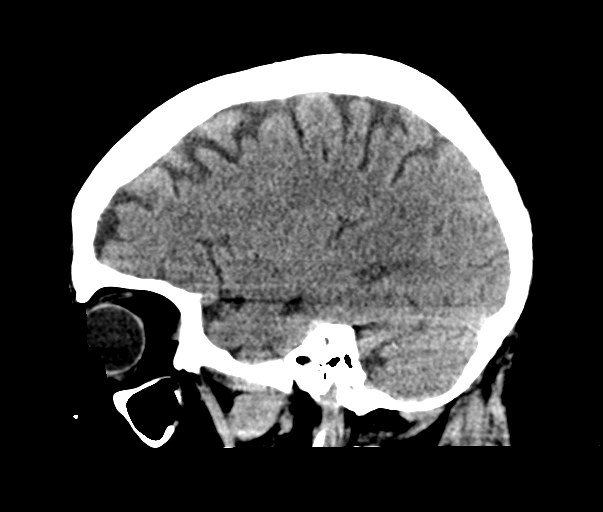
[im 32/63  brain]
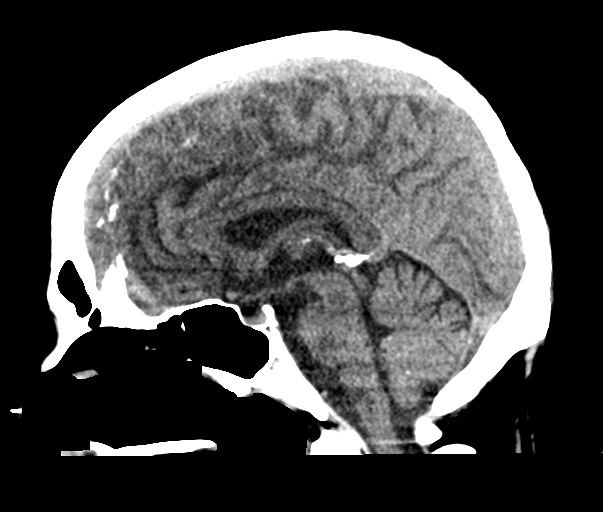
[im 42/63  brain]
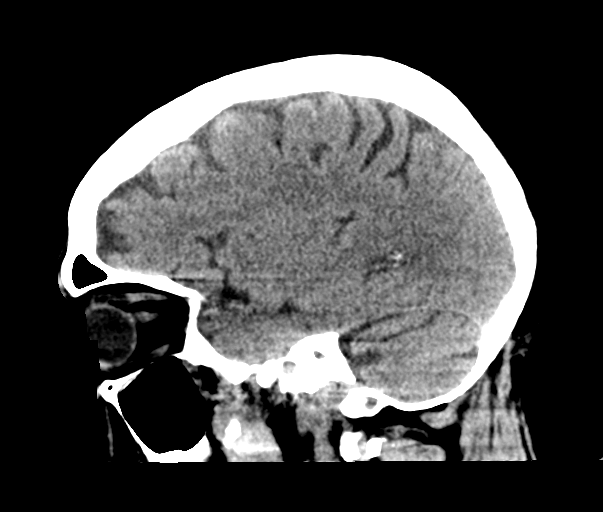

[17 of 47 positions shown; findings below may reference images not displayed]

FINDINGS: Brain: No evidence of acute infarction, hemorrhage, hydrocephalus,
extra-axial collection or mass lesion/mass effect.

Vascular: No hyperdense vessel or unexpected calcification.

Skull: Normal. Negative for fracture or focal lesion.

Sinuses/Orbits: No acute finding.

Other: None.
IMPRESSION: No acute intracranial abnormality seen.

## 2024-04-03 ENCOUNTER — Other Ambulatory Visit: Payer: Self-pay | Admitting: Pulmonary Disease

## 2024-04-03 DIAGNOSIS — J849 Interstitial pulmonary disease, unspecified: Secondary | ICD-10-CM

## 2024-04-03 DIAGNOSIS — J4530 Mild persistent asthma, uncomplicated: Secondary | ICD-10-CM

## 2024-04-06 ENCOUNTER — Ambulatory Visit: Admission: RE | Admit: 2024-04-06 | Discharge: 2024-04-06 | Attending: Pulmonary Disease

## 2024-04-06 DIAGNOSIS — J849 Interstitial pulmonary disease, unspecified: Secondary | ICD-10-CM

## 2024-04-06 DIAGNOSIS — J4530 Mild persistent asthma, uncomplicated: Secondary | ICD-10-CM
